# Patient Record
Sex: Female | Born: 1935 | Race: White | Hispanic: No | Smoking: Never smoker
Health system: Southern US, Community
[De-identification: ages and names within clinical notes are randomized; demographics above are authoritative.]

## PROBLEM LIST (undated history)

## (undated) DIAGNOSIS — Z8673 Personal history of transient ischemic attack (TIA), and cerebral infarction without residual deficits: Secondary | ICD-10-CM

## (undated) DIAGNOSIS — I10 Essential (primary) hypertension: Secondary | ICD-10-CM

## (undated) DIAGNOSIS — I2510 Atherosclerotic heart disease of native coronary artery without angina pectoris: Secondary | ICD-10-CM

## (undated) DIAGNOSIS — Z87442 Personal history of urinary calculi: Secondary | ICD-10-CM

## (undated) DIAGNOSIS — E785 Hyperlipidemia, unspecified: Secondary | ICD-10-CM

## (undated) DIAGNOSIS — Z8489 Family history of other specified conditions: Secondary | ICD-10-CM

## (undated) DIAGNOSIS — R011 Cardiac murmur, unspecified: Secondary | ICD-10-CM

## (undated) DIAGNOSIS — C801 Malignant (primary) neoplasm, unspecified: Secondary | ICD-10-CM

## (undated) HISTORY — PX: OTHER SURGICAL HISTORY: SHX169

## (undated) HISTORY — DX: Essential (primary) hypertension: I10

## (undated) HISTORY — PX: EYE SURGERY: SHX253

## (undated) HISTORY — PX: MASTOIDECTOMY: SHX711

## (undated) HISTORY — PX: DILATION AND CURETTAGE OF UTERUS: SHX78

## (undated) HISTORY — DX: Hyperlipidemia, unspecified: E78.5

## (undated) HISTORY — DX: Personal history of transient ischemic attack (TIA), and cerebral infarction without residual deficits: Z86.73

---

## 2008-05-25 ENCOUNTER — Inpatient Hospital Stay (HOSPITAL_COMMUNITY): Admission: EM | Admit: 2008-05-25 | Discharge: 2008-05-26 | Payer: Self-pay | Admitting: Emergency Medicine

## 2008-05-25 ENCOUNTER — Ambulatory Visit: Payer: Self-pay | Admitting: Cardiology

## 2008-05-26 ENCOUNTER — Encounter (INDEPENDENT_AMBULATORY_CARE_PROVIDER_SITE_OTHER): Payer: Self-pay | Admitting: Neurology

## 2011-03-04 NOTE — H&P (Signed)
NAME:  MARLIN, BRYS           ACCOUNT NO.:  [id number]   MEDICAL RECORD NO.:  [id number]          PATIENT TYPE:  INP   LOCATION:  3021                         FACILITY:  [facility]   PHYSICIAN:  Marlan Palau, M.D.  DATE OF BIRTH:  Jun 08, 1929   DATE OF ADMISSION:  05/25/2008  DATE OF DISCHARGE:                              HISTORY & PHYSICAL   HISTORY OF PRESENT ILLNESS:  Rebecca Pruitt is a 77 year old right-  handed white female born on 08-27-1929, with a history of diabetes,  hypertension, and cerebrovascular disease.  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  The patient has moved to the  [place], [place] area today and has probably suffered a TIA event  that occurred at 7:21 p.m. today.  The patient noted onset of speech  disturbance with slurring of speech, but also making nonsensical words.  This would undulating severity off and on for about an hour or so even  while in the emergency room.  The patient had no associated headache,  visual disturbance, numbness, or weakness on the arms or legs.  The  patient underwent a CT scan of the head that shows no acute changes, but  moderate amount of chronic small-vessel ischemic changes were noted.  The patient had been on Aggrenox and aspirin prior to this admission,  has NIH stroke scale score of 0, not a tPA candidate due to minimal  deficit.  Neurology was called for further evaluation.   PAST MEDICAL HISTORY:  Significant for:  1. Episode of transient aphasia with NIH stroke scale score 0      currently.  2. History of stroke, possible Wallenberg stroke in 2008.  3. Diabetes.  4. Hypertension.  5. Diabetic retinopathy.  6. Obesity.  7. Hysterectomy.  8. Bifid uterus, status post surgery.  9. Laser eye surgery in the past.  10.History of mastoid surgery in the past.   MEDICATIONS:  Include:  1. Aggrenox 1 twice  daily.  2. Aspirin 81 mg daily.  3. Univasc unknown dose daily.  4. Verapamil unknown dose daily.  5. Metformin 1000 mg twice daily.  6. NPH insulin 70/30, 42 units twice daily.  7. Lipitor unknown dose daily.  8. TriCor 145 mg daily.  9. Cosopt 1 drop both eyes b.i.d.   The patient has an allergy to CODEINE and does not smoke or drink.   SOCIAL HISTORY:  This patient is widowed.  She has just recently moved  to [place], [place] area.  She is retired.  She has 1 daughter who  is alive and well.   FAMILY MEDICAL HISTORY:  Her mother died with old age, had heart  disease.  Father died with cancer of prostate.  The patient has 1 sister  who is alive and well.   REVIEW OF SYSTEMS:  Notable for no recent fevers or chills.  The patient  denies headache.  Denies speech disturbance.  Denies trouble with neck  pain, shortness of breath, chest pains, or palpitations.  Denies any  abdominal pain or trouble with controlling bowels or bladder.  Denies  any blackout episodes or seizures.   PHYSICAL EXAMINATION:  VITAL SIGNS:  Blood pressure is 164/69, heart  rate 64, respiratory rate 20, and temperature afebrile.  GENERAL:  This patient is a moderately obese white female who is alert  and cooperative at the time of examination.  HEENT:  Head is atraumatic.  Eyes, pupils are postsurgical, round and  reactive to light.  Disks are flat.  Laser treatment changes in the  retina are seen.  NECK:  Supple.  No carotid bruits noted.  RESPIRATORY:  Clear.  CARDIOVASCULAR:  Regular rate and rhythm.  No obvious murmurs or rubs  noted.  ABDOMEN:  Positive bowel sounds.  No organomegaly or tenderness noted.  EXTREMITIES:  Trace edema of the ankles bilaterally.  NEUROLOGIC:  Cranial nerves as above.  Facial symmetry is present.  The  patient has good sensation of the face to pinprick and soft touch  bilaterally.  She has good strength of facial muscles and the muscles  with head turn and shoulder  shrug bilaterally.  Speech is well  enunciated, not aphasic.  Motor testing reveals 5/5 strength in all  fours.  Good symmetric motor tone is noted throughout.  Sensory testing  is intact to pinprick, soft touch, and vibratory sensation throughout.  Position sense is intact in all fours.  No evidence of extinction is  noted.  The patient has good finger-nose-finger and heel-to-shin.  Gait  was not tested.  Deep tendon reflexes are depressed, but symmetric.  Toes are downgoing bilaterally.  No drift is seen in the arms or legs.  No evidence of extinction is noted.   LABORATORY VALUES:  Notable for white count of 11.6, hemoglobin of 13.1,  hematocrit 39.5, MCV of 94.7, and platelets of 332.  The rest of blood  work is pending.   CT scan of the head is as above.   IMPRESSION:  1. History of transient ischemic attack, referable to left brain with      transient aphasia.  2. Diabetes.  3. Hypertension.  4. Dyslipidemia.   This patient does have a history of some prior cerebrovascular disease.  She has been on Aggrenox and aspirin.  We will admit the patient for  further workup at this point.   PLAN:  1. Admission to [facility].  2. MRI of the brain.  3. MRI angiogram of intracranial and extracranial vessels.  4. A 2-D echocardiogram.  5. Continue Aggrenox for now.  The patient is not a tPA candidate due      to minimal deficit.      Marlan Palau, M.D.  Electronically Signed     CKW/MEDQ  D:  05/25/2008  T:  05/26/2008  Job:  [id number]

## 2011-03-07 NOTE — Discharge Summary (Signed)
NAME:  Rebecca, Pruitt           ACCOUNT NO.:  [id number]   MEDICAL RECORD NO.:  [id number]          PATIENT TYPE:  INP   LOCATION:  3021                         FACILITY:  [facility]   PHYSICIAN:  Pramod P. Pearlean Brownie, MD    DATE OF BIRTH:  1928-12-11   DATE OF ADMISSION:  05/25/2008  DATE OF DISCHARGE:  05/26/2008                               DISCHARGE SUMMARY   DIAGNOSES AT TIME OF DISCHARGE:  1. Left brain transient ischemic attack with transient aphasia,      completely resolved.  2. 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  3. Diabetes.  4. Hypertension.  5. Diabetic retinopathy.  6. Obesity.  7. Hysterectomy.  8. Bifid uterus, status post surgery.  9. Laser eye surgery.  10.Mastoid surgery.   MEDICINE AT THE TIME OF DISCHARGE:  1. Aggrenox 1 b.i.d.  2. Lipitor 20 mg a day.  3. TriCor 48 mg a day.  4. Verapamil SA 240 mg a day.  5. Moexipril 15-25 mg a day.  6. Metformin 1000 mg b.i.d.  7. Humulin 70/30 42 units b.i.d.  8. Aspirin 81 mg a day.  9. Cosopt 1 drop both eyes b.i.d.  10.Macrobid b.i.d. x3 days.   STUDIES PERFORMED:  1. CT of the brain on admission showed no acute abnormality.  MRI of      the brain showed no acute stroke.  MRA of the brain showed no      vessel occlusion or correctable proximal stenosis.  She does have      atherosclerotic changes diffusely.  2. MRA of the neck shows no anterior circulation pathology, mild      atherosclerotic irregularity and carotid bifurcations, but no      stenosis or canal irregularity.  Right vertebral artery is small      and __________; the left vertebral serves the  basilar and appears      patent.  3. EKG shows sinus rhythm with first-degree AV block, left axis      deviation, voltage criteria for left ventricular hypertrophy,      inferior infarct and anterior infarct, both age undetermined.  4. Carotid Doppler shows no ICA stenosis.  A 2-D echocardiogram      performed with results pending at the  time of discharge.   LABORATORY STUDIES:  CBC:  White blood cells 11.6, otherwise normal,  differential normal.  Coagulation studies normal.  Chemistry with  potassium 3.1, otherwise normal.  Liver function tests normal.  Hemoglobin A1c 6.  Homocystine 8.9.  Cholesterol 147, triglycerides 175,  HDL 30, and LDL 82.  Urinalysis with positive nitrate, moderate  leukocyte esterase, 7-10 white blood cells, 0-2 red blood cells, and  many bacteria.   HISTORY OF PRESENT ILLNESS:  Rebecca Pruitt is a 77 year old  right-handed Caucasian female with a history of diabetes, hypertension,  and cerebrovascular disease.  She has what sounds like a left Wallenberg  stroke that occurred in 2008 with resulting ataxia and tendency to fall  to the left.  The patient just moved to [place], [place] today the  day of admission and probably suffered  a TIA event that occurred at 7:21  p.m. today.  The patient noticed onset of speech disturbance with  slurring of speech but also making nonsensical words.  Symptoms were  undulating for about an hour or so even while in the emergency room.  She had no associated headache, visual disturbance, or numbness or  weakness of the arms or legs.  CT of the brain showed no acute  abnormality, but had moderate chronic small vessel ischemic changes.  She has been on Aggrenox and aspirin prior to admission and she is not a  tPA candidate due to minimal deficits.  She was admitted to the hospital  for further evaluation.   HOSPITAL COURSE:  MRI was negative for acute stroke.  She had stroke  workup completed.  She does have vascular risk factors, which are well  controlled.  Of note, she has white blood cells of 11.6 and positive UA  for urinary tract infection and was started on Macrobid for a total of 3  days.  She was evaluated by PT, OT, and Speech and felt to have no  deficits and was deemed safe to go home.  She has been asked to follow  up with Dr. Anne Hahn  in 2 months and find a primary care physician in the  area and follow up with them within the next 1 month.   CONDITION ON DISCHARGE:  The patient is alert and oriented x3.  No  aphasia.  No dysarthria.  Breath sounds are clear.  Heart rate is  regular.  Gait is steady.  No extremity weakness.  Sensation is intact.   DISCHARGE/PLAN:  1. Discharge home with family.  2. Follow up Dr. Anne Hahn in 2 months.  3. Follow up with primary care physician within 1 month.  4. Continue Aggrenox and aspirin as prior to admission for secondary      stroke prevention, but we would recommend discontinuation of      aspirin alone unless being necessary by primary care physician as      Aggrenox contains aspirin and not needed for a stroke standpoint.      Annie Main, N.P.    ______________________________  Sunny Schlein. Pearlean Brownie, MD    SB/MEDQ  D:  05/30/2008  T:  05/31/2008  Job:  [id number]   cc:   Pramod P. Pearlean Brownie, MD

## 2011-07-18 LAB — LIPID PANEL
Cholesterol: 147
HDL: 30 — ABNORMAL LOW
LDL Cholesterol: 82
Total CHOL/HDL Ratio: 4.9
Triglycerides: 175 — ABNORMAL HIGH
VLDL: 35

## 2011-07-18 LAB — CK TOTAL AND CKMB (NOT AT ARMC)
CK, MB: 2.5
Relative Index: INVALID
Total CK: 63

## 2011-07-18 LAB — URINALYSIS, ROUTINE W REFLEX MICROSCOPIC
Bilirubin Urine: NEGATIVE
Glucose, UA: NEGATIVE
Hgb urine dipstick: NEGATIVE
Ketones, ur: NEGATIVE
Nitrite: POSITIVE — AB
Protein, ur: NEGATIVE
Specific Gravity, Urine: 1.018
Urobilinogen, UA: 0.2
pH: 5

## 2011-07-18 LAB — COMPREHENSIVE METABOLIC PANEL
ALT: 23
AST: 21
Albumin: 3.8
Alkaline Phosphatase: 41
BUN: 16
CO2: 28
Calcium: 10
Chloride: 104
Creatinine, Ser: 0.65
GFR calc Af Amer: >60
GFR calc non Af Amer: >60
Glucose, Bld: 72
Potassium: 3.1 — ABNORMAL LOW
Sodium: 140
Total Bilirubin: 0.6
Total Protein: 6.4

## 2011-07-18 LAB — PROTIME-INR
INR: 0.9
Prothrombin Time: 12.6

## 2011-07-18 LAB — DIFFERENTIAL
Basophils Absolute: 0
Basophils Relative: 0
Eosinophils Absolute: 0.2
Eosinophils Relative: 2
Lymphocytes Relative: 21
Lymphs Abs: 2.4
Monocytes Absolute: 1.2 — ABNORMAL HIGH
Monocytes Relative: 10
Neutro Abs: 7.7
Neutrophils Relative %: 67

## 2011-07-18 LAB — CBC
HCT: 39.5
Hemoglobin: 13.1
MCHC: 33.3
MCV: 94.7
Platelets: 322
RBC: 4.17
RDW: 13.9
WBC: 11.6 — ABNORMAL HIGH

## 2011-07-18 LAB — URINE MICROSCOPIC-ADD ON

## 2011-07-18 LAB — APTT: aPTT: 36

## 2011-07-18 LAB — TROPONIN I: Troponin I: 0.01

## 2011-07-18 LAB — HOMOCYSTEINE: Homocysteine: 8.9

## 2011-07-18 LAB — HEMOGLOBIN A1C
Hgb A1c MFr Bld: 6
Mean Plasma Glucose: 136

## 2012-02-27 ENCOUNTER — Encounter: Payer: Self-pay | Admitting: *Deleted

## 2015-12-10 ENCOUNTER — Other Ambulatory Visit (HOSPITAL_COMMUNITY): Payer: Self-pay | Admitting: Geriatric Medicine

## 2015-12-10 DIAGNOSIS — R1310 Dysphagia, unspecified: Secondary | ICD-10-CM

## 2015-12-10 DIAGNOSIS — I6340 Cerebral infarction due to embolism of unspecified cerebral artery: Secondary | ICD-10-CM

## 2015-12-13 ENCOUNTER — Ambulatory Visit (HOSPITAL_COMMUNITY)
Admission: RE | Admit: 2015-12-13 | Discharge: 2015-12-13 | Disposition: A | Discharge status: Home / Self Care | Payer: No Typology Code available for payment source | Source: Ambulatory Visit | Attending: Geriatric Medicine | Admitting: Geriatric Medicine

## 2015-12-13 DIAGNOSIS — H548 Legal blindness, as defined in USA: Secondary | ICD-10-CM | POA: Diagnosis not present

## 2015-12-13 DIAGNOSIS — I10 Essential (primary) hypertension: Secondary | ICD-10-CM | POA: Diagnosis not present

## 2015-12-13 DIAGNOSIS — I060 Rheumatic aortic stenosis: Secondary | ICD-10-CM | POA: Insufficient documentation

## 2015-12-13 DIAGNOSIS — I69391 Dysphagia following cerebral infarction: Secondary | ICD-10-CM | POA: Diagnosis not present

## 2015-12-13 DIAGNOSIS — R1310 Dysphagia, unspecified: Secondary | ICD-10-CM | POA: Insufficient documentation

## 2015-12-13 DIAGNOSIS — I6340 Cerebral infarction due to embolism of unspecified cerebral artery: Secondary | ICD-10-CM

## 2015-12-13 DIAGNOSIS — E119 Type 2 diabetes mellitus without complications: Secondary | ICD-10-CM | POA: Insufficient documentation

## 2015-12-13 NOTE — Progress Notes (Signed)
MBSS complete. Full report located under chart review in imaging section.  Tajah Schreiner MA, CCC-SLP [phone]   

## 2019-11-12 ENCOUNTER — Ambulatory Visit: Payer: Medicare Other | Attending: Internal Medicine

## 2019-11-12 DIAGNOSIS — Z23 Encounter for immunization: Secondary | ICD-10-CM | POA: Insufficient documentation

## 2019-11-12 NOTE — Progress Notes (Signed)
   Covid-19 Vaccination Clinic  Name:  [name]    MRN: [id number] DOB: 1929/08/08  11/12/2019  Rebecca Pruitt was observed post Covid-19 immunization for 15 minutes without incidence. She was provided with Vaccine Information Sheet and instruction to access the V-Safe system.   Rebecca Pruitt was instructed to call 911 with any severe reactions post vaccine: Marland Kitchen Difficulty breathing  . Swelling of your face and throat  . A fast heartbeat  . A bad rash all over your body  . Dizziness and weakness    Immunizations Administered    Name Date Dose VIS Date Route   Pfizer COVID-19 Vaccine 11/12/2019 12:44 PM 0.3 mL 09/30/2019 Intramuscular   Manufacturer: [place]   Lot: [id number]   [place]: [id number]

## 2019-12-03 ENCOUNTER — Ambulatory Visit: Payer: Medicare Other | Attending: Internal Medicine

## 2019-12-03 DIAGNOSIS — Z23 Encounter for immunization: Secondary | ICD-10-CM

## 2019-12-03 NOTE — Progress Notes (Signed)
   Covid-19 Vaccination Clinic  Name:  [name]    MRN: [id number] DOB: [birth year]  12/03/2019  Rebecca Pruitt was observed post Covid-19 immunization for 15 minutes without incidence. She was provided with Vaccine Information Sheet and instruction to access the V-Safe system.   Rebecca Pruitt was instructed to call 911 with any severe reactions post vaccine: Marland Kitchen Difficulty breathing  . Swelling of your face and throat  . A fast heartbeat  . A bad rash all over your body  . Dizziness and weakness    Immunizations Administered    Name Date Dose VIS Date Route   Pfizer COVID-19 Vaccine 12/03/2019 11:38 AM 0.3 mL 09/30/2019 Intramuscular   Manufacturer: [place]   Lot: [id number]   [place]: [id number]

## 2020-03-20 DIAGNOSIS — I639 Cerebral infarction, unspecified: Secondary | ICD-10-CM

## 2020-03-20 HISTORY — DX: Cerebral infarction, unspecified: I63.9

## 2020-11-20 ENCOUNTER — Other Ambulatory Visit: Payer: Self-pay | Admitting: Urology

## 2020-11-20 NOTE — Patient Instructions (Addendum)
DUE TO COVID-19 ONLY ONE VISITOR IS ALLOWED TO COME WITH YOU AND STAY IN THE WAITING ROOM ONLY DURING PRE OP AND PROCEDURE DAY OF SURGERY. THE 1 VISITOR  MAY VISIT WITH YOU AFTER SURGERY IN YOUR PRIVATE ROOM DURING VISITING HOURS ONLY!  YOU NEED TO HAVE A COVID 19 TEST ON_2/4______ @_12 :30______, THIS TEST MUST BE DONE BEFORE SURGERY,  COVID TESTING SITE [address] [place] [address], IT IS ON THE RIGHT GOING OUT [address] APPROXIMATELY  2 MINUTES PAST [employer] ON THE RIGHT. ONCE YOUR COVID TEST IS COMPLETED,  PLEASE BEGIN THE QUARANTINE INSTRUCTIONS AS OUTLINED IN YOUR HANDOUT.                Rebecca Pruitt    Your procedure is scheduled on: 11/27/20   Report to [facility] Main  Entrance   Report to admitting at  10:50 AM     Call this number if you have problems the morning of surgery [phone]    Remember: Do not eat food or drink liquids :After Midnight.   BRUSH YOUR TEETH MORNING OF SURGERY AND RINSE YOUR MOUTH OUT, NO CHEWING GUM CANDY OR MINTS.     Take these medicines the morning of surgery with A SIP OF WATER: Aricept     How to Manage Your Diabetes Before and After Surgery  Why is it important to control my blood sugar before and after surgery? . Improving blood sugar levels before and after surgery helps healing and can limit problems. . A way of improving blood sugar control is eating a healthy diet by: o  Eating less sugar and carbohydrates o  Increasing activity/exercise o  Talking with your doctor about reaching your blood sugar goals . High blood sugars (greater than 180 mg/dL) can raise your risk of infections and slow your recovery, so you will need to focus on controlling your diabetes during the weeks before surgery. . Make sure that the doctor who takes care of your diabetes knows about your planned surgery including the date and location.  How do I manage my blood sugar before surgery? . Check your blood sugar at  least 4 times a day, starting 2 days before surgery, to make sure that the level is not too high or low. o Check your blood sugar the morning of your surgery when you wake up and every 2 hours until you get to the Short Stay unit. . If your blood sugar is less than 70 mg/dL, you will need to treat for low blood sugar: o Do not take insulin. o Treat a low blood sugar (less than 70 mg/dL) with  cup of clear juice (cranberry or apple), 4 glucose tablets, OR glucose gel. o Recheck blood sugar in 15 minutes after treatment (to make sure it is greater than 70 mg/dL). If your blood sugar is not greater than 70 mg/dL on recheck, call [phone] for further instructions. . Report your blood sugar to the short stay nurse when you get to Short Stay.  . If you are admitted to the hospital after surgery: o Your blood sugar will be checked by the staff and you will probably be given insulin after surgery (instead of oral diabetes medicines) to make sure you have good blood sugar levels. o The goal for blood sugar control after surgery is 80-180 mg/dL.   WHAT DO I DO ABOUT MY DIABETES MEDICATION?  Marland Kitchen Do not take oral diabetes medicines (pills) the morning of surgery.  Marland Kitchen  THE NIGHT BEFORE SURGERY, take     units of       insulin.       . THE MORNING OF SURGERY, take   units of         insulin.  . The day of surgery, do not take other diabetes injectables, including Byetta (exenatide), Bydureon (exenatide ER), Victoza (liraglutide), or Trulicity (dulaglutide).  . If your CBG is greater than 220 mg/dL, you may take  of your sliding scale  . (correction) dose of insulin.                              You may not have any metal on your body including hair pins and              piercings  Do not wear jewelry, make-up, lotions, powders or perfumes, deodorant             Do not wear nail polish on your fingernails.  Do not shave  48 hours prior to surgery.  .   Do not bring valuables to the hospital. [facility] IS NOT             RESPONSIBLE   FOR VALUABLES.  Contacts, dentures or bridgework may not be worn into surgery.      Patients discharged the day of surgery will not be allowed to drive home.   IF YOU ARE HAVING SURGERY AND GOING HOME THE SAME DAY, YOU MUST HAVE AN ADULT TO DRIVE YOU HOME AND BE WITH YOU FOR 24 HOURS.  YOU MAY GO HOME BY TAXI OR UBER OR ORTHERWISE, BUT AN ADULT MUST ACCOMPANY YOU HOME AND STAY WITH YOU FOR 24 HOURS.  Name and phone number of your driver:  Special Instructions: N/A              Please read over the following fact sheets you were given: _____________________________________________________________________             [facility] - Preparing for Surgery Before surgery, you can play an important role.  Because skin is not sterile, your skin needs to be as free of germs as possible.  You can reduce the number of germs on your skin by washing with CHG (chlorahexidine gluconate) soap before surgery.  CHG is an antiseptic cleaner which kills germs and bonds with the skin to continue killing germs even after washing. Please DO NOT use if you have an allergy to CHG or antibacterial soaps.  If your skin becomes reddened/irritated stop using the CHG and inform your nurse when you arrive at Short Stay. Do not shave (including legs and underarms) for at least 48 hours prior to the first CHG shower.  . Please follow these instructions carefully:  1.  Shower with CHG Soap the night before surgery and the  morning of Surgery.  2.  If you choose to wash your hair, wash your hair first as usual with your  normal  shampoo.  3.  After you shampoo, rinse your hair and body thoroughly to remove the  shampoo.                                        4.  Use CHG as you would any other liquid soap.  You can apply chg directly  to the skin and wash  Gently with a scrungie or clean washcloth.  5.  Apply the CHG Soap to your body ONLY FROM THE NECK DOWN.    Do not use on face/ open                           Wound or open sores. Avoid contact with eyes, ears mouth and genitals (private parts).                       Wash face,  Genitals (private parts) with your normal soap.             6.  Wash thoroughly, paying special attention to the area where your surgery  will be performed.  7.  Thoroughly rinse your body with warm water from the neck down.  8.  DO NOT shower/wash with your normal soap after using and rinsing off  the CHG Soap.             9.  Pat yourself dry with a clean towel.            10.  Wear clean pajamas.            11.  Place clean sheets on your bed the night of your first shower and do not  sleep with pets. Day of Surgery : Do not apply any lotions/deodorants the morning of surgery.  Please wear clean clothes to the hospital/surgery center.  FAILURE TO FOLLOW THESE INSTRUCTIONS MAY RESULT IN THE CANCELLATION OF YOUR SURGERY PATIENT SIGNATURE_________________________________  NURSE SIGNATURE__________________________________  ________________________________________________________________________

## 2020-11-22 ENCOUNTER — Emergency Department (HOSPITAL_COMMUNITY): Payer: Medicare Other

## 2020-11-22 ENCOUNTER — Encounter (HOSPITAL_COMMUNITY): Payer: Self-pay | Admitting: Emergency Medicine

## 2020-11-22 ENCOUNTER — Other Ambulatory Visit: Payer: Self-pay

## 2020-11-22 ENCOUNTER — Encounter (HOSPITAL_COMMUNITY): Payer: Self-pay

## 2020-11-22 ENCOUNTER — Encounter (HOSPITAL_COMMUNITY): Payer: Self-pay | Admitting: Physician Assistant

## 2020-11-22 ENCOUNTER — Inpatient Hospital Stay (HOSPITAL_COMMUNITY)
Admission: EM | Admit: 2020-11-22 | Discharge: 2020-11-30 | DRG: 659 | Disposition: A | Discharge status: Home Health | Payer: Medicare Other | Attending: Internal Medicine | Admitting: Internal Medicine

## 2020-11-22 ENCOUNTER — Encounter (HOSPITAL_COMMUNITY)
Admission: RE | Admit: 2020-11-22 | Discharge: 2020-11-22 | Disposition: A | Discharge status: Home / Self Care | Payer: Medicare Other | Source: Ambulatory Visit | Attending: Urology | Admitting: Urology

## 2020-11-22 DIAGNOSIS — Z885 Allergy status to narcotic agent status: Secondary | ICD-10-CM

## 2020-11-22 DIAGNOSIS — N136 Pyonephrosis: Principal | ICD-10-CM | POA: Diagnosis present

## 2020-11-22 DIAGNOSIS — I080 Rheumatic disorders of both mitral and aortic valves: Secondary | ICD-10-CM | POA: Diagnosis present

## 2020-11-22 DIAGNOSIS — Z7902 Long term (current) use of antithrombotics/antiplatelets: Secondary | ICD-10-CM

## 2020-11-22 DIAGNOSIS — R531 Weakness: Secondary | ICD-10-CM | POA: Diagnosis not present

## 2020-11-22 DIAGNOSIS — Z01818 Encounter for other preprocedural examination: Secondary | ICD-10-CM | POA: Insufficient documentation

## 2020-11-22 DIAGNOSIS — F0391 Unspecified dementia with behavioral disturbance: Secondary | ICD-10-CM

## 2020-11-22 DIAGNOSIS — N390 Urinary tract infection, site not specified: Secondary | ICD-10-CM

## 2020-11-22 DIAGNOSIS — N3000 Acute cystitis without hematuria: Secondary | ICD-10-CM

## 2020-11-22 DIAGNOSIS — Z87440 Personal history of urinary (tract) infections: Secondary | ICD-10-CM

## 2020-11-22 DIAGNOSIS — Z8673 Personal history of transient ischemic attack (TIA), and cerebral infarction without residual deficits: Secondary | ICD-10-CM

## 2020-11-22 DIAGNOSIS — Z794 Long term (current) use of insulin: Secondary | ICD-10-CM

## 2020-11-22 DIAGNOSIS — Z20822 Contact with and (suspected) exposure to covid-19: Secondary | ICD-10-CM | POA: Diagnosis present

## 2020-11-22 DIAGNOSIS — Z87442 Personal history of urinary calculi: Secondary | ICD-10-CM

## 2020-11-22 DIAGNOSIS — R319 Hematuria, unspecified: Secondary | ICD-10-CM

## 2020-11-22 DIAGNOSIS — Z90710 Acquired absence of both cervix and uterus: Secondary | ICD-10-CM

## 2020-11-22 DIAGNOSIS — N323 Diverticulum of bladder: Secondary | ICD-10-CM | POA: Diagnosis present

## 2020-11-22 DIAGNOSIS — R011 Cardiac murmur, unspecified: Secondary | ICD-10-CM | POA: Diagnosis present

## 2020-11-22 DIAGNOSIS — E11319 Type 2 diabetes mellitus with unspecified diabetic retinopathy without macular edema: Secondary | ICD-10-CM | POA: Diagnosis present

## 2020-11-22 DIAGNOSIS — Z79899 Other long term (current) drug therapy: Secondary | ICD-10-CM

## 2020-11-22 DIAGNOSIS — Q248 Other specified congenital malformations of heart: Secondary | ICD-10-CM

## 2020-11-22 DIAGNOSIS — N1370 Vesicoureteral-reflux, unspecified: Secondary | ICD-10-CM | POA: Diagnosis present

## 2020-11-22 DIAGNOSIS — R5381 Other malaise: Secondary | ICD-10-CM

## 2020-11-22 DIAGNOSIS — E119 Type 2 diabetes mellitus without complications: Secondary | ICD-10-CM | POA: Insufficient documentation

## 2020-11-22 DIAGNOSIS — E785 Hyperlipidemia, unspecified: Secondary | ICD-10-CM | POA: Diagnosis present

## 2020-11-22 DIAGNOSIS — G9341 Metabolic encephalopathy: Secondary | ICD-10-CM | POA: Diagnosis present

## 2020-11-22 DIAGNOSIS — N320 Bladder-neck obstruction: Secondary | ICD-10-CM | POA: Diagnosis present

## 2020-11-22 DIAGNOSIS — I2510 Atherosclerotic heart disease of native coronary artery without angina pectoris: Secondary | ICD-10-CM | POA: Diagnosis present

## 2020-11-22 DIAGNOSIS — F0390 Unspecified dementia without behavioral disturbance: Secondary | ICD-10-CM | POA: Diagnosis present

## 2020-11-22 DIAGNOSIS — I313 Pericardial effusion (noninflammatory): Secondary | ICD-10-CM | POA: Diagnosis present

## 2020-11-22 DIAGNOSIS — H548 Legal blindness, as defined in USA: Secondary | ICD-10-CM | POA: Diagnosis present

## 2020-11-22 DIAGNOSIS — E876 Hypokalemia: Secondary | ICD-10-CM | POA: Diagnosis present

## 2020-11-22 DIAGNOSIS — H9190 Unspecified hearing loss, unspecified ear: Secondary | ICD-10-CM | POA: Diagnosis present

## 2020-11-22 DIAGNOSIS — D62 Acute posthemorrhagic anemia: Secondary | ICD-10-CM | POA: Diagnosis not present

## 2020-11-22 DIAGNOSIS — I10 Essential (primary) hypertension: Secondary | ICD-10-CM | POA: Diagnosis present

## 2020-11-22 DIAGNOSIS — L8990 Pressure ulcer of unspecified site, unspecified stage: Secondary | ICD-10-CM | POA: Insufficient documentation

## 2020-11-22 DIAGNOSIS — N029 Recurrent and persistent hematuria with unspecified morphologic changes: Secondary | ICD-10-CM | POA: Diagnosis present

## 2020-11-22 DIAGNOSIS — L89151 Pressure ulcer of sacral region, stage 1: Secondary | ICD-10-CM | POA: Diagnosis present

## 2020-11-22 DIAGNOSIS — Z7982 Long term (current) use of aspirin: Secondary | ICD-10-CM

## 2020-11-22 HISTORY — DX: Malignant (primary) neoplasm, unspecified: C80.1

## 2020-11-22 HISTORY — DX: Personal history of urinary calculi: Z87.442

## 2020-11-22 HISTORY — DX: Family history of other specified conditions: Z84.89

## 2020-11-22 HISTORY — DX: Cardiac murmur, unspecified: R01.1

## 2020-11-22 HISTORY — DX: Atherosclerotic heart disease of native coronary artery without angina pectoris: I25.10

## 2020-11-22 LAB — CBC
HCT: 36.5 % (ref 36.0–46.0)
HCT: 36.9 % (ref 36.0–46.0)
Hemoglobin: 11.5 g/dL — ABNORMAL LOW (ref 12.0–15.0)
Hemoglobin: 11.7 g/dL — ABNORMAL LOW (ref 12.0–15.0)
MCH: 29.3 pg (ref 26.0–34.0)
MCH: 29.5 pg (ref 26.0–34.0)
MCHC: 31.5 g/dL (ref 30.0–36.0)
MCHC: 31.7 g/dL (ref 30.0–36.0)
MCV: 93.1 fL (ref 80.0–100.0)
MCV: 92.9 fL (ref 80.0–100.0)
Platelets: 341 10*3/uL (ref 150–400)
Platelets: 332 10*3/uL (ref 150–400)
RBC: 3.92 MIL/uL (ref 3.87–5.11)
RBC: 3.97 MIL/uL (ref 3.87–5.11)
RDW: 14.1 % (ref 11.5–15.5)
RDW: 13.9 % (ref 11.5–15.5)
WBC: 7 10*3/uL (ref 4.0–10.5)
WBC: 7.2 10*3/uL (ref 4.0–10.5)
nRBC: 0 % (ref 0.0–0.2)
nRBC: 0 % (ref 0.0–0.2)

## 2020-11-22 LAB — BASIC METABOLIC PANEL
Anion gap: 10 (ref 5–15)
BUN: 16 mg/dL (ref 8–23)
CO2: 26 mmol/L (ref 22–32)
Calcium: 10.2 mg/dL (ref 8.9–10.3)
Chloride: 101 mmol/L (ref 98–111)
Creatinine, Ser: 0.73 mg/dL (ref 0.44–1.00)
GFR, Estimated: >60 mL/min (ref >60)
Glucose, Bld: 140 mg/dL — ABNORMAL HIGH (ref 70–99)
Potassium: 3.6 mmol/L (ref 3.5–5.1)
Sodium: 137 mmol/L (ref 135–145)

## 2020-11-22 LAB — DIFFERENTIAL
Abs Immature Granulocytes: 0.02 10*3/uL (ref 0.00–0.07)
Basophils Absolute: 0 10*3/uL (ref 0.0–0.1)
Basophils Relative: 0 %
Eosinophils Absolute: 0.2 10*3/uL (ref 0.0–0.5)
Eosinophils Relative: 2 %
Immature Granulocytes: 0 %
Lymphocytes Relative: 26 %
Lymphs Abs: 1.9 10*3/uL (ref 0.7–4.0)
Monocytes Absolute: 0.8 10*3/uL (ref 0.1–1.0)
Monocytes Relative: 11 %
Neutro Abs: 4.3 10*3/uL (ref 1.7–7.7)
Neutrophils Relative %: 61 %

## 2020-11-22 LAB — URINALYSIS, ROUTINE W REFLEX MICROSCOPIC
Bilirubin Urine: NEGATIVE
Glucose, UA: NEGATIVE mg/dL
Ketones, ur: NEGATIVE mg/dL
Nitrite: NEGATIVE
Protein, ur: 100 mg/dL — AB
RBC / HPF: >50 RBC/hpf — ABNORMAL HIGH (ref 0–5)
Specific Gravity, Urine: 1.01 (ref 1.005–1.030)
WBC, UA: >50 WBC/hpf — ABNORMAL HIGH (ref 0–5)
pH: 6 (ref 5.0–8.0)

## 2020-11-22 LAB — COMPREHENSIVE METABOLIC PANEL
ALT: 9 U/L (ref 0–44)
AST: 10 U/L — ABNORMAL LOW (ref 15–41)
Albumin: 3 g/dL — ABNORMAL LOW (ref 3.5–5.0)
Alkaline Phosphatase: 44 U/L (ref 38–126)
Anion gap: 11 (ref 5–15)
BUN: 17 mg/dL (ref 8–23)
CO2: 26 mmol/L (ref 22–32)
Calcium: 10.5 mg/dL — ABNORMAL HIGH (ref 8.9–10.3)
Chloride: 101 mmol/L (ref 98–111)
Creatinine, Ser: 0.82 mg/dL (ref 0.44–1.00)
GFR, Estimated: >60 mL/min (ref >60)
Glucose, Bld: 154 mg/dL — ABNORMAL HIGH (ref 70–99)
Potassium: 3.9 mmol/L (ref 3.5–5.1)
Sodium: 138 mmol/L (ref 135–145)
Total Bilirubin: 0.7 mg/dL (ref 0.3–1.2)
Total Protein: 7 g/dL (ref 6.5–8.1)

## 2020-11-22 LAB — I-STAT CHEM 8, ED
BUN: 17 mg/dL (ref 8–23)
Calcium, Ion: 1.41 mmol/L — ABNORMAL HIGH (ref 1.15–1.40)
Chloride: 101 mmol/L (ref 98–111)
Creatinine, Ser: 0.7 mg/dL (ref 0.44–1.00)
Glucose, Bld: 148 mg/dL — ABNORMAL HIGH (ref 70–99)
HCT: 35 % — ABNORMAL LOW (ref 36.0–46.0)
Hemoglobin: 11.9 g/dL — ABNORMAL LOW (ref 12.0–15.0)
Potassium: 3.9 mmol/L (ref 3.5–5.1)
Sodium: 137 mmol/L (ref 135–145)
TCO2: 28 mmol/L (ref 22–32)

## 2020-11-22 LAB — PROTIME-INR
INR: 1.1 (ref 0.8–1.2)
Prothrombin Time: 14.1 seconds (ref 11.4–15.2)

## 2020-11-22 LAB — RAPID URINE DRUG SCREEN, HOSP PERFORMED
Amphetamines: NOT DETECTED
Barbiturates: NOT DETECTED
Benzodiazepines: NOT DETECTED
Cocaine: NOT DETECTED
Opiates: NOT DETECTED
Tetrahydrocannabinol: NOT DETECTED

## 2020-11-22 LAB — GLUCOSE, CAPILLARY: Glucose-Capillary: 148 mg/dL — ABNORMAL HIGH (ref 70–99)

## 2020-11-22 LAB — I-STAT BETA HCG BLOOD, ED (MC, WL, AP ONLY): I-stat hCG, quantitative: 7.2 m[IU]/mL — ABNORMAL HIGH (ref <5)

## 2020-11-22 LAB — HEMOGLOBIN A1C
Hgb A1c MFr Bld: 7.5 % — ABNORMAL HIGH (ref 4.8–5.6)
Mean Plasma Glucose: 168.55 mg/dL

## 2020-11-22 LAB — CBG MONITORING, ED: Glucose-Capillary: 119 mg/dL — ABNORMAL HIGH (ref 70–99)

## 2020-11-22 LAB — SARS CORONAVIRUS 2 BY RT PCR (HOSPITAL ORDER, PERFORMED IN ~~LOC~~ HOSPITAL LAB): SARS Coronavirus 2: NEGATIVE

## 2020-11-22 LAB — LACTIC ACID, PLASMA: Lactic Acid, Venous: 1 mmol/L (ref 0.5–1.9)

## 2020-11-22 LAB — APTT: aPTT: 34 seconds (ref 24–36)

## 2020-11-22 MED ORDER — SODIUM CHLORIDE 0.9 % IV SOLN
100.0000 mL/h | INTRAVENOUS | Status: DC
  Administered 2020-11-22 – 2020-11-25 (×6): 100 mL/h via INTRAVENOUS

## 2020-11-22 MED ORDER — ACETAMINOPHEN 325 MG PO TABS
650.0000 mg | ORAL_TABLET | Freq: Four times a day (QID) | ORAL | Status: DC | PRN
  Administered 2020-11-24: 650 mg via ORAL
  Filled 2020-11-22: qty 2

## 2020-11-22 MED ORDER — SODIUM CHLORIDE 0.9 % IV SOLN
75.0000 mL/h | INTRAVENOUS | Status: AC
  Administered 2020-11-23: 75 mL/h via INTRAVENOUS

## 2020-11-22 MED ORDER — INSULIN ASPART 100 UNIT/ML ~~LOC~~ SOLN
0.0000 [IU] | SUBCUTANEOUS | Status: DC
  Administered 2020-11-23: 17:00:00 2 [IU] via SUBCUTANEOUS
  Administered 2020-11-24: 18:00:00 3 [IU] via SUBCUTANEOUS
  Administered 2020-11-24 – 2020-11-25 (×3): 2 [IU] via SUBCUTANEOUS
  Administered 2020-11-25 – 2020-11-26 (×3): 1 [IU] via SUBCUTANEOUS
  Administered 2020-11-26: 3 [IU] via SUBCUTANEOUS
  Administered 2020-11-27: 2 [IU] via SUBCUTANEOUS
  Administered 2020-11-27 (×2): 1 [IU] via SUBCUTANEOUS
  Administered 2020-11-27 – 2020-11-28 (×2): 2 [IU] via SUBCUTANEOUS
  Administered 2020-11-28: 1 [IU] via SUBCUTANEOUS
  Administered 2020-11-28: 3 [IU] via SUBCUTANEOUS
  Administered 2020-11-28: 1 [IU] via SUBCUTANEOUS
  Administered 2020-11-28: 2 [IU] via SUBCUTANEOUS
  Administered 2020-11-28: 1 [IU] via SUBCUTANEOUS
  Administered 2020-11-29: 2 [IU] via SUBCUTANEOUS
  Administered 2020-11-29: 1 [IU] via SUBCUTANEOUS
  Administered 2020-11-29: 2 [IU] via SUBCUTANEOUS
  Administered 2020-11-29: 1 [IU] via SUBCUTANEOUS
  Administered 2020-11-29: 2 [IU] via SUBCUTANEOUS
  Administered 2020-11-30 (×2): 1 [IU] via SUBCUTANEOUS
  Filled 2020-11-22: qty 0.09

## 2020-11-22 MED ORDER — CITALOPRAM HYDROBROMIDE 20 MG PO TABS
20.0000 mg | ORAL_TABLET | Freq: Every day | ORAL | Status: DC
  Administered 2020-11-23 – 2020-11-29 (×8): 20 mg via ORAL
  Filled 2020-11-22 (×6): qty 1
  Filled 2020-11-22: qty 2
  Filled 2020-11-22: qty 1

## 2020-11-22 MED ORDER — SODIUM CHLORIDE 0.9 % IV SOLN
1.0000 g | Freq: Once | INTRAVENOUS | Status: AC
  Administered 2020-11-22: 1 g via INTRAVENOUS
  Filled 2020-11-22: qty 10

## 2020-11-22 MED ORDER — ACETAMINOPHEN 650 MG RE SUPP: 650.0000 mg | Freq: Four times a day (QID) | RECTAL | Status: DC | PRN

## 2020-11-22 MED ORDER — SODIUM CHLORIDE 0.9 % IV BOLUS
500.0000 mL | Freq: Once | INTRAVENOUS | Status: AC
  Administered 2020-11-22: 500 mL via INTRAVENOUS

## 2020-11-22 NOTE — Progress Notes (Addendum)
COVID Vaccine Completed:Yes Date COVID Vaccine completed:12/03/19 -Booster 09/30/20 COVID vaccine manufacturer: [place]     PCP - T[facility] FNP Cardiologist - no  Chest x-ray -no  EKG - 11/22/20- chart, epic Stress Test - no ECHO - no Cardiac Cath -NA  Pacemaker/ICD device last checked:NA  Sleep Study - no CPAP -   Fasting Blood Sugar - 120-130 Checks Blood Sugar BID_____ times a day  Blood Thinner Instructions:Plavix and ASA/ [place] Aspirin Instructions:Continue/ Dr. Hampton Abbot Last Dose:  Anesthesia review:   Patient denies shortness of breath, fever, cough and chest pain at PAT appointment  Yes  . Patient verbalized understanding of instructions that were given to them at the PAT appointment. Patient was also instructed that they will need to review over the PAT instructions again at home before surgery. Yes Pt's Daughter is her POA and care giver. Daughter was concerened about her Mother's decline over the last 6 days. Her mother is lethargic and slow to respond needing more help to transfer to the wheelchair.  Vital signs were stable. I called Konrad Felix PA to talk with the daughter about  her concerns as well as when would be appropriate to take the Pt to the PCP/ ED if she declines more prior to DOS.

## 2020-11-22 NOTE — ED Provider Notes (Addendum)
[facility] DEPT Provider Note   CSN: [id number] Arrival date & time: 11/22/20  1518     History Chief Complaint  Patient presents with  . Weakness    Rebecca Pruitt is a 87 y.o. female.  HPI   Patient presents to the ER for evaluation of worsening weakness.  Patient lives at home with her daughter who is her primary caregiver.  The daughter has noticed in the last several weeks the decline in the patient's health.  She normally was able to walk 150 feet with a walker but in the last several weeks that has been declining and now she is only able to walk maybe 10 feet.  Patient has been dealing with a urinary tract infection and it sounds like possibly some type of obstructive process.  He has been seeing urology.  Patient was at the hospital getting preop testing when she had difficulty even getting into the car.  Daughter brought her in for further evaluation.  Daughter is concerned that she might have an infection.  Her appearance is similar to when she has had an infection in the past.  Patient also has had prior strokes but the daughter states this is not the typical appearance of her stroke.  She is not having any focal weakness or deficits but more generalized.  She has not had any fevers.  No vomiting or diarrhea.  Patient has been eating but her appetite is less.  Past Medical History:  Diagnosis Date  . Cancer ([place])    skin  . Coronary artery disease   . Diabetes mellitus   . Diabetic retinopathy   . Dyslipidemia   . H/O: CVA (cardiovascular accident)   . Hearing loss   . Heart murmur   . History of kidney stones   . Hypertension   . Stroke ([place]) 03/2020   x5    There are no problems to display for this patient.   Past Surgical History:  Procedure Laterality Date  . CESAREAN SECTION    . EYE SURGERY Bilateral   . hysterectomy - unknown type    . MASTOIDECTOMY     as a child     OB History   No obstetric history on file.      Family History  Problem Relation Age of Onset  . Prostate cancer Other     Social History   Tobacco Use  . Smoking status: Never Smoker  . Smokeless tobacco: Never Used  Vaping Use  . Vaping Use: Never used  Substance Use Topics  . Alcohol use: No  . Drug use: Never    Home Medications Prior to Admission medications   Medication Sig Start Date End Date Taking? Authorizing Provider  amoxicillin-clavulanate (AUGMENTIN) 500-125 MG tablet Take 1 tablet by mouth 2 (two) times daily. 11/19/20   [provider]  aspirin 81 MG tablet Take 81 mg by mouth at bedtime.    [provider]  citalopram (CELEXA) 20 MG tablet Take 20 mg by mouth at bedtime.    [provider]  clopidogrel (PLAVIX) 75 MG tablet Take 75 mg by mouth daily.    [provider]  CRANBERRY PO Take 4,200 mg by mouth 2 (two) times daily.    [provider]  donepezil (ARICEPT) 10 MG tablet Take 10 mg by mouth at bedtime.    [provider]  fenofibrate 160 MG tablet Take 40 mg by mouth at bedtime.    [provider]  furosemide (LASIX) 20 MG tablet Take 10 mg by mouth daily.    [provider]  insulin NPH-insulin regular (NOVOLIN 70/30) (70-30) 100 UNIT/ML injection Inject 1-7 Units into the skin 2 (two) times daily as needed (high blood sugar).    [provider]  mupirocin ointment (BACTROBAN) 2 % Apply 1 application topically 2 (two) times daily as needed for wound care. 09/25/20   [provider]    Allergies    Codeine  Review of Systems   Review of Systems  Constitutional: Negative for fever.  Respiratory: Negative for shortness of breath.   Gastrointestinal: Negative for abdominal pain and vomiting.  Neurological: Negative for seizures.    Physical Exam Updated Vital Signs BP (!) 150/56   Pulse (!) 53   Temp 97.9 F (36.6 C) (Oral)   Resp 16   SpO2 100%   Physical Exam Vitals and nursing note reviewed.   Constitutional:      Appearance: She is well-developed and well-nourished. She is not diaphoretic.     Comments: Frail,  HENT:     Head: Normocephalic and atraumatic.     Right Ear: External ear normal.     Left Ear: External ear normal.     Mouth/Throat:     Mouth: Mucous membranes are dry.  Eyes:     General: No scleral icterus.       Right eye: No discharge.        Left eye: No discharge.     Conjunctiva/sclera: Conjunctivae normal.  Neck:     Trachea: No tracheal deviation.  Cardiovascular:     Rate and Rhythm: Normal rate and regular rhythm.     Pulses: Intact distal pulses.  Pulmonary:     Effort: Pulmonary effort is normal. No respiratory distress.     Breath sounds: Normal breath sounds. No stridor. No wheezing or rales.  Abdominal:     General: Bowel sounds are normal. There is no distension.     Palpations: Abdomen is soft.     Tenderness: There is no abdominal tenderness. There is no guarding or rebound.  Musculoskeletal:        General: No tenderness or edema.     Cervical back: Neck supple.  Skin:    General: Skin is warm and dry.     Findings: No rash.  Neurological:     Mental Status: She is alert.     Cranial Nerves: No cranial nerve deficit (no facial droop, extraocular movements intact, no slurred speech , speech very slow).     Sensory: No sensory deficit.     Motor: Weakness present. No abnormal muscle tone or seizure activity.     Deep Tendon Reflexes: Strength normal.     Comments: Arms held flexed in front of her, does move arms slowly when asked to, when asked to move her legs states she can but then does not move her legs, confused  Psychiatric:        Mood and Affect: Mood and affect normal.     ED Results / Procedures / Treatments   Labs (all labs ordered are listed, but only abnormal results are displayed) Labs Reviewed  CBC - Abnormal; Notable for the following components:      Result Value   Hemoglobin 11.7 (*)    All other components  within normal limits  COMPREHENSIVE METABOLIC PANEL - Abnormal; Notable for the following components:   Glucose, Bld 154 (*)    Calcium 10.5 (*)  Albumin 3.0 (*)    AST 10 (*)    All other components within normal limits  URINALYSIS, ROUTINE W REFLEX MICROSCOPIC - Abnormal; Notable for the following components:   APPearance TURBID (*)    Hgb urine dipstick LARGE (*)    Protein, ur 100 (*)    Leukocytes,Ua LARGE (*)    RBC / HPF >50 (*)    WBC, UA >50 (*)    Bacteria, UA MANY (*)    All other components within normal limits  I-STAT CHEM 8, ED - Abnormal; Notable for the following components:   Glucose, Bld 148 (*)    Calcium, Ion 1.41 (*)    Hemoglobin 11.9 (*)    HCT 35.0 (*)    All other components within normal limits  CBG MONITORING, ED - Abnormal; Notable for the following components:   Glucose-Capillary 119 (*)    All other components within normal limits  I-STAT BETA HCG BLOOD, ED ([facility], [facility], [facility] ONLY) - Abnormal; Notable for the following components:   I-stat hCG, quantitative 7.2 (*)    All other components within normal limits  SARS CORONAVIRUS 2 BY RT PCR (HOSPITAL ORDER, [facility])  URINE CULTURE  PROTIME-INR  APTT  DIFFERENTIAL  RAPID URINE DRUG SCREEN, HOSP PERFORMED  LACTIC ACID, PLASMA    EKG EKG Interpretation  Date/Time:  Thursday November 22 2020 15:27:15 EST Ventricular Rate:  50 PR Interval:    QRS Duration: 113 QT Interval:  519 QTC Calculation: 474 R Axis:   -63 Text Interpretation: Sinus or ectopic atrial rhythm Prolonged PR interval Abnormal R-wave progression, late transition LVH with IVCD, LAD and secondary repol abnrm Anterior ST elevation, probably due to LVH No significant change since last tracing Confirmed by Dorie Rank [phone]) on 11/22/2020 5:42:51 PM   Radiology CT HEAD WO CONTRAST  Result Date: 11/22/2020 CLINICAL DATA:  Focal neuro deficit greater than 6 hours. EXAM: CT HEAD WITHOUT CONTRAST TECHNIQUE:  Contiguous axial images were obtained from the base of the skull through the vertex without intravenous contrast. COMPARISON:  CT head 05/25/2008 FINDINGS: Brain: Generalized atrophy. Extensive white matter hypodensity bilaterally, chronic. Negative for acute infarct, hemorrhage, mass. Vascular: Negative for hyperdense vessel Skull: Negative Sinuses/Orbits: Paranasal sinuses clear. Right mastoidectomy. Left mastoid clear. Bilateral cataract extraction. Other: None IMPRESSION: Atrophy and chronic white matter changes.  No acute abnormality. Electronically Signed   By: Franchot Gallo M.D.   On: 11/22/2020 17:40   DG Chest Portable 1 View  Result Date: 11/22/2020 CLINICAL DATA:  Weakness. EXAM: PORTABLE CHEST 1 VIEW COMPARISON:  05/25/2008 FINDINGS: The heart size is mildly enlarged. Aortic calcifications are noted. There is no pneumothorax. No large pleural effusion. No acute osseous abnormality. IMPRESSION: 1. No acute cardiopulmonary disease. 2. Mild cardiomegaly. 3. Aortic calcifications. Electronically Signed   By: Constance Holster M.D.   On: 11/22/2020 16:52    Procedures Procedures   Medications Ordered in ED Medications  sodium chloride 0.9 % bolus 500 mL (0 mLs Intravenous Stopped 11/22/20 1800)    Followed by  0.9 %  sodium chloride infusion (100 mL/hr Intravenous New Bag/Given 11/22/20 1610)  cefTRIAXone (ROCEPHIN) 1 g in sodium chloride 0.9 % 100 mL IVPB (has no administration in time range)    ED Course  I have reviewed the triage vital signs and the nursing notes.  Pertinent labs & imaging results that were available during my care of the patient were reviewed by me and considered in my medical  decision making (see chart for details).  Clinical Course as of 11/22/20 1847  Thu Nov 22, 2020  1545 HCG was ordered at triage.  Not clinically indicated.  HCG [JK]  1741 Labs reviewed.  Urinalysis is consistent with a urinary tract infection. [OA]  4166 Patient has many bacteria as well as  white blood cell clumps [JK]  1741 Lactic acid level is normal. [JK]  0630 Metabolic panel does not show evidence of acute kidney injury. [ZS]  0109 CBC is normal. [JK]  1742 Chest x-ray without acute findings [JK]  1749 CT scan without acute findings [JK]  1750 Chest x-ray without acute findings [JK]  1750 Records reviewed.  Patient is scheduled for cystoscopy on February 8. [NA]  3557 Confirmed code status with Daughter.  Would be OK with intubation if she started having breathing problems.   Agrees with DNR [JK]    Clinical Course User Index [JK] Dorie Rank, MD   MDM Rules/Calculators/A&P                          Pt presents with increasing weakness.   Normally more ambulatory.  Having trouble walking now.  Not eating well.  UA is consistent with infection.    No signs of sepsis on exam.  Vitals stable.  Daughter concerned about her at home.  Will consult with medical service for possible admission, IV abx,. Final Clinical Impression(s) / ED Diagnoses Final diagnoses:  Acute cystitis without hematuria  Weakness     Dorie Rank, MD 11/22/20 Jaci Lazier, MD 11/22/20 [phone]

## 2020-11-22 NOTE — H&P (Addendum)
[place] Dan[place] ZOX:[id number] DOB: [birth year] DOA: 11/22/2020     PCP: Elizabeth PalauAnderson, Teresa, FNP   Outpatient Specialists:     Urology Dr. Ellison CarwinNewshome  Patient arrived to ER on 11/22/20 at 1518 Referred by Attending Linwood DibblesKnapp, Jon, MD   Patient coming from: home Lives  With family   Chief Complaint:   Chief Complaint  Patient presents with  . Weakness    HPI: [place] is a 87 y.o. female with medical history significant of CVA CAD, DM, HLD, HTN  Presented with progressive generalized weakness over the past few weeks usually able to walk with a walker although there are difficulties but lately have had trouble even making a few steps. Followed by urology for recurrent urinary tract infection per family last urine culture did not grow anything. Patient was getting some preop testing and had hard time getting out of a car requiring rapid response to be called. Family strongly suspects recurrent urinary tract infection.  Family states she has not had any fevers no nausea no vomiting has had declined appetite. Recently been treated with Augmentin Patient is scheduled for cystoscopy on 8 February Due to right kidney blockage per family they have attempted to take a look but could not visualize  Imaging from Jan 25th Bilateral nephrolithiasis.  Image degradation by motion artifact. Mild right hydroureteronephrosis, with asymmetric soft tissue density at the right UVJ, and several tiny calculi in urinary bladder near the right UVJ. These findings could be due to urothelial neoplasm or distal ureteral calculi. Consider CT urogram without and with contrast or cystoscopy/retrograde ureteroscopy for further evaluation.  Chronic bladder outlet obstruction, with large diverticulum in the bladder fundus.   Infectious risk factors:  Reports  severe fatigue     Has been vaccinated against COVID booster September 30, 2020   Initial COVID TEST  NEGATIVE   Lab Results  Component  Value Date   SARSCOV2NAA NEGATIVE 11/22/2020     Regarding pertinent Chronic problems    Hyperlipidemia -  on  fenofibrate Lipid Panel     Component Value Date/Time   CHOL  05/26/2008 0605    147        ATP III CLASSIFICATION:  <200     mg/dL   Desirable  [id number]  mg/dL   Borderline High  >=782>=240    mg/dL   High   TRIG 956175 (H) 21/30/865708/04/2008 0605   HDL 30 (L) 05/26/2008 0605   CHOLHDL 4.9 05/26/2008 0605   VLDL 35 05/26/2008 0605   LDLCALC  05/26/2008 0605    82        Total Cholesterol/HDL:CHD Risk Coronary Heart Disease Risk Table                     Men   Women  1/2 Average Risk   3.4   3.3  On Lasix for hypertension    CAD  - On  Plavix     Mitral and aortic valve dysfunction        DM 2 -  Lab Results  Component Value Date   HGBA1C 7.5 (H) 11/22/2020   on insulin   70/30 family is titration for every 25 point above 150 she gives her 1 unit She gets 2 unit in Am and 4 at night      Hx of CVA -  With  residual deficits on  Plavix     Dementia - on Aricept     Chronic anemia - baseline hg  Hemoglobin & Hematocrit  Recent Labs    11/22/20 1418 11/22/20 1524 11/22/20 1542  HGB 11.5* 11.7* 11.9*    While in ER: Found to have UTI started on Rocephin urine culture obtained Lactic acid within normal limits no evidence of AKI or leukocytosis Chest x-ray unremarkable CT head unremarkable Covid negative   Hospitalist was called for admission for debility and possible UTi  The following Work up has been ordered so far:  Orders Placed This Encounter  Procedures  . SARS Coronavirus 2 by RT PCR (hospital order, performed in [facility] hospital lab) Nasopharyngeal Nasopharyngeal Swab  . Urine Culture  . CT HEAD WO CONTRAST  . DG Chest Portable 1 View  . Protime-INR  . APTT  . CBC  . Differential  . Comprehensive metabolic panel  . Urine rapid drug screen (hosp performed)not at [facility]  . Urinalysis, Routine w reflex microscopic  . Diet NPO time specified  . CT  not indicated at this time  . Cardiac Monitoring  . Modified Stroke Scale (mNIHSS) Document mNIHSS assessment every 2 hours for a total of 12 hours  . Stroke swallow screen  . Initiate Carrier Fluid Protocol  . If O2 sat If O2 Sat < 94%, administer O2 at 2 liters/minute via nasal cannula.  . Do not attempt resuscitation/DNR  . Consult to hospitalist  ALL PATIENTS BEING ADMITTED/HAVING PROCEDURES NEED COVID-19 SCREENING  . Pulse oximetry, continuous  . I-stat chem 8, ED  . CBG monitoring, ED  . I-Stat beta hCG blood, ED ([facility], [facility], [facility] only)  . ED EKG  . Saline lock IV     Following Medications were ordered in ER: Medications  sodium chloride 0.9 % bolus 500 mL (0 mLs Intravenous Stopped 11/22/20 1800)    Followed by  0.9 %  sodium chloride infusion (100 mL/hr Intravenous New Bag/Given 11/22/20 1610)  cefTRIAXone (ROCEPHIN) 1 g in sodium chloride 0.9 % 100 mL IVPB (has no administration in time range)        Consult Orders  (From admission, onward)         Start     Ordered   11/22/20 1823  Consult to hospitalist  ALL PATIENTS BEING ADMITTED/HAVING PROCEDURES NEED COVID-19 SCREENING  Once       Comments: ALL PATIENTS BEING ADMITTED/HAVING PROCEDURES NEED COVID-19 SCREENING  Provider:  (Not yet assigned)  Question Answer Comment  Place call to: [facility]   Reason for Consult Admit      11/22/20 1822           Significant initial  Findings: Abnormal Labs Reviewed  CBC - Abnormal; Notable for the following components:      Result Value   Hemoglobin 11.7 (*)    All other components within normal limits  COMPREHENSIVE METABOLIC PANEL - Abnormal; Notable for the following components:   Glucose, Bld 154 (*)    Calcium 10.5 (*)    Albumin 3.0 (*)    AST 10 (*)    All other components within normal limits  URINALYSIS, ROUTINE W REFLEX MICROSCOPIC - Abnormal; Notable for the following components:   APPearance TURBID (*)    Hgb urine dipstick LARGE (*)    Protein, ur  100 (*)    Leukocytes,Ua LARGE (*)    RBC / HPF >50 (*)    WBC, UA >50 (*)    Bacteria, UA MANY (*)    All other components within normal limits  I-STAT CHEM 8, ED - Abnormal; Notable for  the following components:   Glucose, Bld 148 (*)    Calcium, Ion 1.41 (*)    Hemoglobin 11.9 (*)    HCT 35.0 (*)    All other components within normal limits  CBG MONITORING, ED - Abnormal; Notable for the following components:   Glucose-Capillary 119 (*)    All other components within normal limits  I-STAT BETA HCG BLOOD, ED ([facility], [facility], [facility] ONLY) - Abnormal; Notable for the following components:   I-stat hCG, quantitative 7.2 (*)    All other components within normal limits    Otherwise labs showing:  Recent Labs  Lab 11/22/20 1418 11/22/20 1524 11/22/20 1542  NA 137 138 137  K 3.6 3.9 3.9  CO2 26 26  --   GLUCOSE 140* 154* 148*  BUN 16 17 17   CREATININE 0.73 0.82 0.70  CALCIUM 10.2 10.5*  --     Cr   Stable,  Lab Results  Component Value Date   CREATININE 0.70 11/22/2020   CREATININE 0.82 11/22/2020   CREATININE 0.73 11/22/2020    Recent Labs  Lab 11/22/20 1524  AST 10*  ALT 9  ALKPHOS 44  BILITOT 0.7  PROT 7.0  ALBUMIN 3.0*   Lab Results  Component Value Date   CALCIUM 10.5 (H) 11/22/2020      WBC      Component Value Date/Time   WBC 7.2 11/22/2020 1524   LYMPHSABS 1.9 11/22/2020 1524   MONOABS 0.8 11/22/2020 1524   EOSABS 0.2 11/22/2020 1524   BASOSABS 0.0 11/22/2020 1524    Plt: Lab Results  Component Value Date   PLT 332 11/22/2020    Lactic Acid, Venous    Component Value Date/Time   LATICACIDVEN 1.0 11/22/2020 1615     HG/HCT stable,      Component Value Date/Time   HGB 11.9 (L) 11/22/2020 1542   HCT 35.0 (L) 11/22/2020 1542   MCV 92.9 11/22/2020 1524       Cardiac Panel (last 3 results)   ECG: Ordered Personally reviewed by me showing: HR : 50 Rhythm:  SR ectopic referral prolonged PR secondary report normality LVH ,    no evidence of  ischemic changes QTC 474   BNP (last 3 results) No results for input(s): BNP in the last 8760 hours.    DM  labs:  HbA1C: Recent Labs    11/22/20 1418  HGBA1C 7.5*     CBG (last 3)  Recent Labs    11/22/20 1324 11/22/20 1537  GLUCAP 148* 119*     UA evidence of UTI     Urine analysis:    Component Value Date/Time   COLORURINE YELLOW 11/22/2020 1603   APPEARANCEUR TURBID (A) 11/22/2020 1603   LABSPEC 1.010 11/22/2020 1603   PHURINE 6.0 11/22/2020 1603   GLUCOSEU NEGATIVE 11/22/2020 1603   HGBUR LARGE (A) 11/22/2020 1603   BILIRUBINUR NEGATIVE 11/22/2020 1603   KETONESUR NEGATIVE 11/22/2020 1603   PROTEINUR 100 (A) 11/22/2020 1603   UROBILINOGEN 0.2 05/26/2008 0817   NITRITE NEGATIVE 11/22/2020 1603   LEUKOCYTESUR LARGE (A) 11/22/2020 1603      Ordered  CT HEAD  NON acute  CXR -  NON acute   ED Triage Vitals  Enc Vitals Group     BP 11/22/20 1522 (!) 131/55     Pulse Rate 11/22/20 1522 (!) 51     Resp 11/22/20 1522 18     Temp 11/22/20 1524 97.9 F (36.6 C)     Temp Source  11/22/20 1524 Oral     SpO2 11/22/20 1522 98 %     Weight --      Height --      Head Circumference --      Peak Flow --      Pain Score 11/22/20 1523 0     Pain Loc --      Pain Edu? --      Excl. in GC? --   TMAX(24)@       Latest  Blood pressure (!) 150/56, pulse (!) 53, temperature 97.9 F (36.6 C), temperature source Oral, resp. rate 16, SpO2 100 %.    Review of Systems:    Pertinent positives include:    Fatigue,  confusion  Constitutional:  No weight loss, night sweats, Fevers, chills, weight loss  HEENT:  No headaches, Difficulty swallowing,Tooth/dental problems,Sore throat,  No sneezing, itching, ear ache, nasal congestion, post nasal drip,  Cardio-vascular:  No chest pain, Orthopnea, PND, anasarca, dizziness, palpitations.no Bilateral lower extremity swelling  GI:  No heartburn, indigestion, abdominal pain, nausea, vomiting, diarrhea, change in bowel habits,  loss of appetite, melena, blood in stool, hematemesis Resp:  no shortness of breath at rest. No dyspnea on exertion, No excess mucus, no productive cough, No non-productive cough, No coughing up of blood. No change in color of mucus.No wheezing. Skin:  no rash or lesions. No jaundice GU:  no dysuria, change in color of urine, no urgency or frequency. No straining to urinate.  No flank pain.  Musculoskeletal:  No joint pain or no joint swelling. No decreased range of motion. No back pain.  Psych:  No change in mood or affect. No depression or anxiety. No memory loss.  Neuro: no localizing neurological complaints, no tingling, no weakness, no double vision, no gait abnormality, no slurred speech, no  All systems reviewed and apart from HOPI all are negative  Past Medical History:   Past Medical History:  Diagnosis Date  . Cancer (HCC)    skin  . Coronary artery disease   . Diabetes mellitus   . Diabetic retinopathy   . Dyslipidemia   . H/O: CVA (cardiovascular accident)   . Hearing loss   . Heart murmur   . History of kidney stones   . Hypertension   . Stroke [facility]) 03/2020   x5     Past Surgical History:  Procedure Laterality Date  . CESAREAN SECTION    . EYE SURGERY Bilateral   . hysterectomy - unknown type    . MASTOIDECTOMY     as a child    Social History:  Ambulatory  Dan Humphreys     reports that she has never smoked. She has never used smokeless tobacco. She reports that she does not drink alcohol and does not use drugs.     Family History:   Family History  Problem Relation Age of Onset  . Prostate cancer Other     Allergies: Allergies  Allergen Reactions  . Codeine     Syncope      Prior to Admission medications   Medication Sig Start Date End Date Taking? Authorizing Provider  amoxicillin-clavulanate (AUGMENTIN) 500-125 MG tablet Take 1 tablet by mouth 2 (two) times daily. 11/19/20   [provider]  aspirin 81 MG tablet Take 81 mg by  mouth at bedtime.    [provider]  citalopram (CELEXA) 20 MG tablet Take 20 mg by mouth at bedtime.    [provider]  clopidogrel (PLAVIX) 75 MG  tablet Take 75 mg by mouth daily.    [provider]  CRANBERRY PO Take 4,200 mg by mouth 2 (two) times daily.    [provider]  donepezil (ARICEPT) 10 MG tablet Take 10 mg by mouth at bedtime.    [provider]  fenofibrate 160 MG tablet Take 40 mg by mouth at bedtime.    [provider]  furosemide (LASIX) 20 MG tablet Take 10 mg by mouth daily.    [provider]  insulin NPH-insulin regular (NOVOLIN 70/30) (70-30) 100 UNIT/ML injection Inject 1-7 Units into the skin 2 (two) times daily as needed (high blood sugar).    [provider]  mupirocin ointment (BACTROBAN) 2 % Apply 1 application topically 2 (two) times daily as needed for wound care. 09/25/20   [provider]   Physical Exam: Vitals with BMI 11/22/2020 11/22/2020 11/22/2020  Height - - -  Weight - - -  BMI - - -  Systolic 502 774 128  Diastolic 56 51 56  Pulse 53 56 49     1. General:  in No Acute distress   Chronically ill  -appearing 2. Psychological: Alert but not Oriented  3. Head/ENT:     Dry Mucous Membranes                          Head Non traumatic, neck supple                            Poor Dentition 4. SKIN:   decreased Skin turgor,  Skin clean Dry and intact no rash 5. Heart: Regular rate and rhythm ?Murmur, no Rub or gallop 6. Lungs: no wheezes or crackles   7. Abdomen: Soft,  non-tender, Non distended  bowel sounds present 8. Lower extremities: no clubbing, cyanosis, no  edema 9. Neurologically Grossly intact, moving all 4 extremities equally  10. MSK: Normal range of motion, multiple contractures   All other LABS:     Recent Labs  Lab 11/22/20 1418 11/22/20 1524 11/22/20 1542  WBC 7.0 7.2  --   NEUTROABS  --  4.3  --   HGB 11.5* 11.7* 11.9*  HCT 36.5 36.9 35.0*  MCV  93.1 92.9  --   PLT 341 332  --      Recent Labs  Lab 11/22/20 1418 11/22/20 1524 11/22/20 1542  NA 137 138 137  K 3.6 3.9 3.9  CL 101 101 101  CO2 26 26  --   GLUCOSE 140* 154* 148*  BUN 16 17 17   CREATININE 0.73 0.82 0.70  CALCIUM 10.2 10.5*  --      Recent Labs  Lab 11/22/20 1524  AST 10*  ALT 9  ALKPHOS 44  BILITOT 0.7  PROT 7.0  ALBUMIN 3.0*       Cultures: No results found for: SDES, SPECREQUEST, CULT, REPTSTATUS   Radiological Exams on Admission: CT HEAD WO CONTRAST  Result Date: 11/22/2020 CLINICAL DATA:  Focal neuro deficit greater than 6 hours. EXAM: CT HEAD WITHOUT CONTRAST TECHNIQUE: Contiguous axial images were obtained from the base of the skull through the vertex without intravenous contrast. COMPARISON:  CT head 05/25/2008 FINDINGS: Brain: Generalized atrophy. Extensive white matter hypodensity bilaterally, chronic. Negative for acute infarct, hemorrhage, mass. Vascular: Negative for hyperdense vessel Skull: Negative Sinuses/Orbits: Paranasal sinuses clear. Right mastoidectomy. Left mastoid clear. Bilateral cataract extraction. Other: None IMPRESSION: Atrophy and chronic  white matter changes.  No acute abnormality. Electronically Signed   By: Franchot Gallo M.D.   On: 11/22/2020 17:40   DG Chest Portable 1 View  Result Date: 11/22/2020 CLINICAL DATA:  Weakness. EXAM: PORTABLE CHEST 1 VIEW COMPARISON:  05/25/2008 FINDINGS: The heart size is mildly enlarged. Aortic calcifications are noted. There is no pneumothorax. No large pleural effusion. No acute osseous abnormality. IMPRESSION: 1. No acute cardiopulmonary disease. 2. Mild cardiomegaly. 3. Aortic calcifications. Electronically Signed   By: Constance Holster M.D.   On: 11/22/2020 16:52    Chart has been reviewed    Assessment/Plan   87 y.o. female with medical history significant of CVA CAD, DM, HLD, HTN  Admitted for UTI and debility  Present on Admission: . UTI (urinary tract infection) -  -  treat with Rocephin         await results of urine culture and adjust antibiotic coverage as needed Given history of nephrolithiasis and right ureteral obstruction discussed with urology who aware the patient will see tomorrow in consult At this point no evidence of sepsis no indication for emergent procedure  . Debility PT OT evaluation  . Dementia ([place]) resume home medications when able to tolerate monitor for any sign of sundowning  abnormal cardiac valves -Per daughter patient was not a candidate for surgical interventions in [place] continue to monitor Given patient may need surgical interventions will obtain echo  DM 2 -  - Order Sensitive  SSI   -hold home insulin regimen    -  check TSH and HgA1C    History of CVA hold off Plavix as patient may need procedures Other plan as per orders.  DVT prophylaxis:  SCD      Code Status:    Code Status: Partial Code  limitted code ok to do intubation, shock, Bipap, pressors comfort care as per  family  I had personally discussed CODE STATUS with family    Family Communication:   Family   at  Bedside  plan of care was discussed on the phone with  Daughter  Disposition Plan:       To home once workup is complete and patient is stable   Following barriers for discharge:                           able to transition to PO antibiotics                             Will need to be able to tolerate PO                            Will likely need home health,                                            Would benefit from PT/OT eval prior to DC  Ordered                                      Nutrition    consulted  Consults called:  Consulted   Urology Dr. Lovena Neighbours is aware will pass on to Dr. Nehemiah Massed who will see tomorrow  Admission status:  ED Disposition    ED Disposition Condition [place]: [place] [[id number]]  Level of Care: Telemetry [5]  Admit to tele based on  following criteria: Monitor QTC interval  Covid Evaluation: Confirmed COVID Negative  Diagnosis: UTI (urinary tract infection) [id number]  Admitting Physician: Toy Baker [[id number]  Attending Physician: Toy Baker [[id number]]       Obs    Level of care     tele  For 24H    Lab Results  Component Value Date   [place] NEGATIVE 11/22/2020     Precautions: admitted as Covid Negative    PPE: Used by the provider:   N95   eye Goggles,  Gloves    Lucia Harm 11/22/2020, 11:28 PM    [facility]     after 2 AM please page floor coverage PA If 7AM-7PM, please contact the day team taking care of the patient using [email or web address]   Patient was evaluated in the context of the global COVID-19 pandemic, which necessitated consideration that the patient might be at risk for infection with the SARS-CoV-2 virus that causes COVID-19. Institutional protocols and algorithms that pertain to the evaluation of patients at risk for COVID-19 are in a state of rapid change based on information released by regulatory bodies including the CDC and federal and state organizations. These policies and algorithms were followed during the patient's care.

## 2020-11-22 NOTE — ED Triage Notes (Signed)
Patient BIB rapid response, RR said she has a hx of strokes, might have had one today. Equal but weak grip strength upper extremities, no response in lower extremities. Daughter is worried about infection / UTI. Pt able to say her name.

## 2020-11-23 ENCOUNTER — Observation Stay (HOSPITAL_COMMUNITY): Payer: Medicare Other

## 2020-11-23 ENCOUNTER — Inpatient Hospital Stay (HOSPITAL_COMMUNITY): Admission: RE | Admit: 2020-11-23 | Payer: Medicare Other | Source: Ambulatory Visit

## 2020-11-23 DIAGNOSIS — I2510 Atherosclerotic heart disease of native coronary artery without angina pectoris: Secondary | ICD-10-CM | POA: Diagnosis present

## 2020-11-23 DIAGNOSIS — N136 Pyonephrosis: Secondary | ICD-10-CM | POA: Diagnosis present

## 2020-11-23 DIAGNOSIS — Z20822 Contact with and (suspected) exposure to covid-19: Secondary | ICD-10-CM | POA: Diagnosis present

## 2020-11-23 DIAGNOSIS — E11319 Type 2 diabetes mellitus with unspecified diabetic retinopathy without macular edema: Secondary | ICD-10-CM | POA: Diagnosis present

## 2020-11-23 DIAGNOSIS — L89151 Pressure ulcer of sacral region, stage 1: Secondary | ICD-10-CM | POA: Diagnosis present

## 2020-11-23 DIAGNOSIS — D62 Acute posthemorrhagic anemia: Secondary | ICD-10-CM | POA: Diagnosis not present

## 2020-11-23 DIAGNOSIS — E785 Hyperlipidemia, unspecified: Secondary | ICD-10-CM | POA: Diagnosis present

## 2020-11-23 DIAGNOSIS — N323 Diverticulum of bladder: Secondary | ICD-10-CM | POA: Diagnosis present

## 2020-11-23 DIAGNOSIS — N029 Recurrent and persistent hematuria with unspecified morphologic changes: Secondary | ICD-10-CM | POA: Diagnosis present

## 2020-11-23 DIAGNOSIS — H9190 Unspecified hearing loss, unspecified ear: Secondary | ICD-10-CM | POA: Diagnosis present

## 2020-11-23 DIAGNOSIS — H548 Legal blindness, as defined in USA: Secondary | ICD-10-CM | POA: Diagnosis present

## 2020-11-23 DIAGNOSIS — N320 Bladder-neck obstruction: Secondary | ICD-10-CM | POA: Diagnosis present

## 2020-11-23 DIAGNOSIS — I350 Nonrheumatic aortic (valve) stenosis: Secondary | ICD-10-CM

## 2020-11-23 DIAGNOSIS — Z90710 Acquired absence of both cervix and uterus: Secondary | ICD-10-CM | POA: Diagnosis not present

## 2020-11-23 DIAGNOSIS — Z87442 Personal history of urinary calculi: Secondary | ICD-10-CM | POA: Diagnosis not present

## 2020-11-23 DIAGNOSIS — G9341 Metabolic encephalopathy: Secondary | ICD-10-CM | POA: Diagnosis present

## 2020-11-23 DIAGNOSIS — I10 Essential (primary) hypertension: Secondary | ICD-10-CM | POA: Diagnosis present

## 2020-11-23 DIAGNOSIS — I313 Pericardial effusion (noninflammatory): Secondary | ICD-10-CM | POA: Diagnosis present

## 2020-11-23 DIAGNOSIS — N390 Urinary tract infection, site not specified: Secondary | ICD-10-CM | POA: Diagnosis not present

## 2020-11-23 DIAGNOSIS — R531 Weakness: Secondary | ICD-10-CM | POA: Diagnosis present

## 2020-11-23 DIAGNOSIS — Z87440 Personal history of urinary (tract) infections: Secondary | ICD-10-CM | POA: Diagnosis not present

## 2020-11-23 DIAGNOSIS — F0391 Unspecified dementia with behavioral disturbance: Secondary | ICD-10-CM | POA: Diagnosis not present

## 2020-11-23 DIAGNOSIS — Z8673 Personal history of transient ischemic attack (TIA), and cerebral infarction without residual deficits: Secondary | ICD-10-CM | POA: Diagnosis not present

## 2020-11-23 DIAGNOSIS — N1370 Vesicoureteral-reflux, unspecified: Secondary | ICD-10-CM | POA: Diagnosis present

## 2020-11-23 DIAGNOSIS — I080 Rheumatic disorders of both mitral and aortic valves: Secondary | ICD-10-CM | POA: Diagnosis present

## 2020-11-23 DIAGNOSIS — F0390 Unspecified dementia without behavioral disturbance: Secondary | ICD-10-CM | POA: Diagnosis not present

## 2020-11-23 DIAGNOSIS — R319 Hematuria, unspecified: Secondary | ICD-10-CM | POA: Diagnosis not present

## 2020-11-23 DIAGNOSIS — E119 Type 2 diabetes mellitus without complications: Secondary | ICD-10-CM | POA: Diagnosis not present

## 2020-11-23 DIAGNOSIS — R011 Cardiac murmur, unspecified: Secondary | ICD-10-CM | POA: Diagnosis present

## 2020-11-23 DIAGNOSIS — E876 Hypokalemia: Secondary | ICD-10-CM | POA: Diagnosis present

## 2020-11-23 LAB — CBC WITH DIFFERENTIAL/PLATELET
Abs Immature Granulocytes: 0.02 10*3/uL (ref 0.00–0.07)
Basophils Absolute: 0 10*3/uL (ref 0.0–0.1)
Basophils Relative: 0 %
Eosinophils Absolute: 0.2 10*3/uL (ref 0.0–0.5)
Eosinophils Relative: 4 %
HCT: 32.5 % — ABNORMAL LOW (ref 36.0–46.0)
Hemoglobin: 10.4 g/dL — ABNORMAL LOW (ref 12.0–15.0)
Immature Granulocytes: 0 %
Lymphocytes Relative: 28 %
Lymphs Abs: 1.9 10*3/uL (ref 0.7–4.0)
MCH: 29.2 pg (ref 26.0–34.0)
MCHC: 32 g/dL (ref 30.0–36.0)
MCV: 91.3 fL (ref 80.0–100.0)
Monocytes Absolute: 0.7 10*3/uL (ref 0.1–1.0)
Monocytes Relative: 11 %
Neutro Abs: 3.7 10*3/uL (ref 1.7–7.7)
Neutrophils Relative %: 57 %
Platelets: 303 10*3/uL (ref 150–400)
RBC: 3.56 MIL/uL — ABNORMAL LOW (ref 3.87–5.11)
RDW: 13.8 % (ref 11.5–15.5)
WBC: 6.5 10*3/uL (ref 4.0–10.5)
nRBC: 0 % (ref 0.0–0.2)

## 2020-11-23 LAB — ECHOCARDIOGRAM COMPLETE
AR max vel: 1.21 cm2
AV Area VTI: 0.84 cm2
AV Area mean vel: 0.87 cm2
AV Mean grad: 38.3 mmHg
AV Peak grad: 63.1 mmHg
Ao pk vel: 3.97 m/s
Area-P 1/2: 1.5 cm2
Calc EF: 63.6 %
Height: 64 in
MV M vel: 5.8 m/s
MV Peak grad: 134.6 mmHg
MV VTI: 1.35 cm2
Radius: 0.8 cm
S' Lateral: 1.7 cm
Single Plane A2C EF: 61.7 %
Single Plane A4C EF: 62.4 %
Weight: 2112 oz

## 2020-11-23 LAB — COMPREHENSIVE METABOLIC PANEL
ALT: 8 U/L (ref 0–44)
AST: 9 U/L — ABNORMAL LOW (ref 15–41)
Albumin: 2.5 g/dL — ABNORMAL LOW (ref 3.5–5.0)
Alkaline Phosphatase: 37 U/L — ABNORMAL LOW (ref 38–126)
Anion gap: 11 (ref 5–15)
BUN: 13 mg/dL (ref 8–23)
CO2: 26 mmol/L (ref 22–32)
Calcium: 9.6 mg/dL (ref 8.9–10.3)
Chloride: 102 mmol/L (ref 98–111)
Creatinine, Ser: 0.63 mg/dL (ref 0.44–1.00)
GFR, Estimated: >60 mL/min (ref >60)
Glucose, Bld: 122 mg/dL — ABNORMAL HIGH (ref 70–99)
Potassium: 3.3 mmol/L — ABNORMAL LOW (ref 3.5–5.1)
Sodium: 139 mmol/L (ref 135–145)
Total Bilirubin: 0.4 mg/dL (ref 0.3–1.2)
Total Protein: 6.1 g/dL — ABNORMAL LOW (ref 6.5–8.1)

## 2020-11-23 LAB — CBG MONITORING, ED
Glucose-Capillary: 103 mg/dL — ABNORMAL HIGH (ref 70–99)
Glucose-Capillary: 129 mg/dL — ABNORMAL HIGH (ref 70–99)
Glucose-Capillary: 95 mg/dL (ref 70–99)
Glucose-Capillary: 171 mg/dL — ABNORMAL HIGH (ref 70–99)

## 2020-11-23 LAB — CK: Total CK: 20 U/L — ABNORMAL LOW (ref 38–234)

## 2020-11-23 LAB — MAGNESIUM
Magnesium: 1.9 mg/dL (ref 1.7–2.4)
Magnesium: 1.9 mg/dL (ref 1.7–2.4)

## 2020-11-23 LAB — HEMOGLOBIN A1C
Hgb A1c MFr Bld: 7.5 % — ABNORMAL HIGH (ref 4.8–5.6)
Mean Plasma Glucose: 168.55 mg/dL

## 2020-11-23 LAB — PHOSPHORUS
Phosphorus: 2.3 mg/dL — ABNORMAL LOW (ref 2.5–4.6)
Phosphorus: 2.3 mg/dL — ABNORMAL LOW (ref 2.5–4.6)

## 2020-11-23 LAB — TSH: TSH: 1.392 u[IU]/mL (ref 0.350–4.500)

## 2020-11-23 LAB — GLUCOSE, CAPILLARY: Glucose-Capillary: 104 mg/dL — ABNORMAL HIGH (ref 70–99)

## 2020-11-23 MED ORDER — POTASSIUM & SODIUM PHOSPHATES 280-160-250 MG PO PACK
1.0000 | PACK | Freq: Three times a day (TID) | ORAL | Status: AC
  Administered 2020-11-23 (×2): 1 via ORAL
  Filled 2020-11-23 (×3): qty 1

## 2020-11-23 MED ORDER — SODIUM CHLORIDE 0.9 % IV SOLN
1.0000 g | INTRAVENOUS | Status: DC
  Administered 2020-11-23 – 2020-11-24 (×2): 1 g via INTRAVENOUS
  Filled 2020-11-23: qty 1
  Filled 2020-11-23: qty 10
  Filled 2020-11-23: qty 1

## 2020-11-23 NOTE — Progress Notes (Signed)
PROGRESS NOTE    [name]  [id number] DOB: 1929/03/05 DOA: 11/22/2020 PCP: Rebecca Aly, FNP   Chief Complaint  Patient presents with  . Weakness   Brief Narrative:  87 year old female with history of CVA, CAD, diabetes, hyperlipidemia, hypertension, bilateral nephrolithiasis with mild right hydroureteronephrosis -due to urothelial neoplasm or distal ureteral calculi based on Jan 25 imaging, chronic bladder outlet obstruction with large diverticulum in the bladder fundus, who has been brought with progressive generalized weakness over the past few weeks.  As per the report she is usually able to walk with a walker although there are difficulties but lately have had trouble even making a few steps. Followed by urology for recurrent urinary tract infection per family last urine culture did not grow anything. Patient was getting some preop testing  and had hard time getting out of a car requiring rapid response to be called. Family strongly suspects recurrent urinary tract infection. Family states she has not had any fevers no nausea no vomiting has had declined appetite. Recently been treated with Augmentin, Patient is scheduled for cystoscopy on 8 February due to "right kidney blockage"  In the ED found to have UTI and admitted for UTI and debility.  Subjective: Seen and examined this morning, she was getting her echocardiogram done, appears weak frail very hard of hearing and very slow to respond able to tell her name and date of birth, also legally blind.  Labs  k- 3.3 Phos 2.3  Assessment & Plan:  UTI, with history of recurrent UTI/chronic bladder outlet obstruction with large diverticulum in the bladder fundus and bilateral nephrolithiasis with mild right hydroureteronephrosis, for which she was being worked up with cystoscopy for February 8 to rule out distal  ureteral calculi versus urothelial neoplasm. No leukocytosis or lactic acidosis.Urology was consulted on  admission, continue ceftriaxone. She has had 9-10 rounds of antibiotics since October and as soon as she is off antibiotis she does poorly . Discussed w Dr Rebecca Pruitt to cont on antibiotics and await on culture. Her urine culture from his office 1/31 and 10/25/20 no growth on cath urine sample, had ESBL E coli from pcp office from voided urine (less likely accurate), he is okay w/ Rocephin. May need inpatient procedure Dr Rebecca Pruitt will f/u to asses. Recent Labs  Lab 11/22/20 1418 11/22/20 1524 11/22/20 1615 11/23/20 0643  WBC 7.0 7.2  --  6.5  LATICACIDVEN  --   --  1.0  --    Debility, with multiple comorbidities PT OT evaluation as tolerated, was able to walk 1101 ft 3 wks ago, and she declines once she is off antibiotics.  Dementia/very hard of hearing/slow to respond  And confused for few days suspect acute metabolic encephalopathy from [id number] supportive measures home medication fall precaution.  Very hard of hearing- better on left, and is legally blind  Abnormal cardiac valves-as per daughter not a candidate for surgical intervention while in [place] echocardiogram has been ordered here.  DM2 : Continue sliding scale insulin, hold home insulin regimen, hemoglobin A1c stable 7.5 blood sugar controlled. Recent Labs  Lab 11/22/20 1324 11/22/20 1537 11/23/20 0019 11/23/20 0736  GLUCAP 148* 119* 129* 103*   Hypokalemia/hypophosphatemia we will replete with K-Phos.  HTN on lasix-hold.  History of CAD on plavix/abnormal cardiac valves/hyperlipidemia, echocardiogram pending, continue fibroids.  Hx of CVA- on plavix  held on admission pending urology evaluation  Nutrition: Diet Order            Diet Carb Modified Fluid consistency:  Thin; Room service appropriate? Yes  Diet effective now                 Pt's There is no height or weight on file to calculate BMI.  DVT prophylaxis: SCDs Start: 11/22/20 2313 Code Status:   Code Status: Partial Code-confirmed with the  daughter no CPR but wants to proceed with rest of the care in code. Family Communication: plan of care discussed with patient's daughter Rebecca [place] Ms. Rebecca Pruitt  Status is: admitted as Observation Patient remains hospitalized, will need at least 2 midnight stay for ongoing management of her for UTI/debility Dispo: The patient is from: Home  W/ Daughter              Anticipated d/c is to: Home              Anticipated d/c date is: 2 days              Patient currently is not medically stable to d/c.   Difficult to place patient No  Consultants: Urology  Procedures:see note  Culture/Microbiology No results found for: SDES, SPECREQUEST, CULT, REPTSTATUS  Other culture-see note  Medications: Scheduled Meds: . citalopram  20 mg Oral QHS  . insulin aspart  0-9 Units Subcutaneous Q4H  . potassium & sodium phosphates  1 packet Oral TID WC & HS   Continuous Infusions: . sodium chloride Stopped (11/23/20 0017)  . cefTRIAXone (ROCEPHIN)  IV      Antimicrobials: Anti-infectives (From admission, onward)   Start     Dose/Rate Route Frequency Ordered Stop   11/23/20 1800  cefTRIAXone (ROCEPHIN) 1 g in sodium chloride 0.9 % 100 mL IVPB        1 g 200 mL/hr over 30 Minutes Intravenous Every 24 hours 11/23/20 1159 11/26/20 1759   11/22/20 1845  cefTRIAXone (ROCEPHIN) 1 g in sodium chloride 0.9 % 100 mL IVPB        1 g 200 mL/hr over 30 Minutes Intravenous  Once 11/22/20 1841 11/22/20 1927     Objective: Vitals: Today's Vitals   11/23/20 0400 11/23/20 0600 11/23/20 0900 11/23/20 1200  BP: 140/60 (!) 142/66 (!) 141/99 (!) 151/59  Pulse: (!) 51 (!) 50 (!) 53 (!) 55  Resp: 11 16 14 16   Temp:      TempSrc:      SpO2: 100% 100% 100% 100%  PainSc:        Intake/Output Summary (Last 24 hours) at 11/23/2020 1208 Last data filed at 11/23/2020 0843 Gross per 24 hour  Intake 1184.09 ml  Output 1000 ml  Net 184.09 ml   There were no vitals filed for this visit. Weight change:    Intake/Output from previous day: 02/03 0701 - 02/04 0700 In: 1184.1 [I.V.:584.1; IV Piggyback:600] Out: -  Intake/Output this shift: Total I/O In: -  Out: 1000 [Urine:1000] There were no vitals filed for this visit.  Examination: General exam: AAOx1, hard of hearing elderly chronically sick looking HEENT:Oral mucosa moist, Ear/Nose WNL grossly,dentition normal. Respiratory system: bilaterally diminished,no wheezing or crackles,no use of accessory muscle, non tender. Cardiovascular system: S1 & S2 +, regular, No JVD. Gastrointestinal system: Abdomen soft, NT,ND, BS+. Nervous System:Alert, awake, oriented to self, hard of hearing, Extremities: No edema, distal peripheral pulses palpable.  Skin: No rashes,no icterus. MSK: Normal muscle bulk,tone, power   Data Reviewed: I have personally reviewed following labs and imaging studies CBC: Recent Labs  Lab 11/22/20 1418 11/22/20 1524 11/22/20 1542 11/23/20 0643  WBC 7.0  7.2  --  6.5  NEUTROABS  --  4.3  --  3.7  HGB 11.5* 11.7* 11.9* 10.4*  HCT 36.5 36.9 35.0* 32.5*  MCV 93.1 92.9  --  91.3  PLT 341 332  --  381   Basic Metabolic Panel: Recent Labs  Lab 11/22/20 1418 11/22/20 1524 11/22/20 1542 11/23/20 0643  NA 137 138 137 139  K 3.6 3.9 3.9 3.3*  CL 101 101 101 102  CO2 26 26  --  26  GLUCOSE 140* 154* 148* 122*  BUN 16 17 17 13   CREATININE 0.73 0.82 0.70 0.63  CALCIUM 10.2 10.5*  --  9.6  MG  --   --   --  1.9  1.9  PHOS  --   --   --  2.3*  2.3*   GFR: Estimated Creatinine Clearance: 41.2 mL/min (by C-G formula based on SCr of 0.63 mg/dL). Liver Function Tests: Recent Labs  Lab 11/22/20 1524 11/23/20 0643  AST 10* 9*  ALT 9 8  ALKPHOS 44 37*  BILITOT 0.7 0.4  PROT 7.0 6.1*  ALBUMIN 3.0* 2.5*   No results for input(s): LIPASE, AMYLASE in the last 168 hours. No results for input(s): AMMONIA in the last 168 hours. Coagulation Profile: Recent Labs  Lab 11/22/20 1524  INR 1.1   Cardiac  Enzymes: Recent Labs  Lab 11/23/20 0643  CKTOTAL 20*   BNP (last 3 results) No results for input(s): PROBNP in the last 8760 hours. HbA1C: Recent Labs    11/22/20 1418 11/23/20 0643  HGBA1C 7.5* 7.5*   CBG: Recent Labs  Lab 11/22/20 1324 11/22/20 1537 11/23/20 0019 11/23/20 0736  GLUCAP 148* 119* 129* 103*   Lipid Profile: No results for input(s): CHOL, HDL, LDLCALC, TRIG, CHOLHDL, LDLDIRECT in the last 72 hours. Thyroid Function Tests: Recent Labs    11/23/20 0643  TSH 1.392   Anemia Panel: No results for input(s): VITAMINB12, FOLATE, FERRITIN, TIBC, IRON, RETICCTPCT in the last 72 hours. Sepsis Labs: Recent Labs  Lab 11/22/20 1615  LATICACIDVEN 1.0    Recent Results (from the past 240 hour(s))  SARS Coronavirus 2 by RT PCR (hospital order, performed in [facility] hospital lab) Nasopharyngeal Nasopharyngeal Swab     Status: None   Collection Time: 11/22/20  4:21 PM   Specimen: Nasopharyngeal Swab  Result Value Ref Range Status   SARS Coronavirus 2 NEGATIVE NEGATIVE Final    Comment: (NOTE) SARS-CoV-2 target nucleic acids are NOT DETECTED.  The SARS-CoV-2 RNA is generally detectable in upper and lower respiratory specimens during the acute phase of infection. The lowest concentration of SARS-CoV-2 viral copies this assay can detect is 250 copies / mL. A negative result does not preclude SARS-CoV-2 infection and should not be used as the sole basis for treatment or other patient management decisions.  A negative result may occur with improper specimen collection / handling, submission of specimen other than nasopharyngeal swab, presence of viral mutation(s) within the areas targeted by this assay, and inadequate number of viral copies (<250 copies / mL). A negative result must be combined with clinical observations, patient history, and epidemiological information.  Fact Sheet for Patients:   StrictlyIdeas.no  Fact Sheet for  Healthcare Providers: BankingDealers.co.za  This test is not yet approved or  cleared by the [place] FDA and has been authorized for detection and/or diagnosis of SARS-CoV-2 by FDA under an Emergency Use Authorization (EUA).  This EUA will remain in effect (meaning this test can be  used) for the duration of the COVID-19 declaration under Section 564(b)(1) of the Act, 21 U.S.C. section 360bbb-3(b)(1), unless the authorization is terminated or revoked sooner.  Performed at [facility], [place] [address], [place], [place] [address]      Radiology Studies: CT HEAD WO CONTRAST  Result Date: 11/22/2020 CLINICAL DATA:  Focal neuro deficit greater than 6 hours. EXAM: CT HEAD WITHOUT CONTRAST TECHNIQUE: Contiguous axial images were obtained from the base of the skull through the vertex without intravenous contrast. COMPARISON:  CT head 05/25/2008 FINDINGS: Brain: Generalized atrophy. Extensive white matter hypodensity bilaterally, chronic. Negative for acute infarct, hemorrhage, mass. Vascular: Negative for hyperdense vessel Skull: Negative Sinuses/Orbits: Paranasal sinuses clear. Right mastoidectomy. Left mastoid clear. Bilateral cataract extraction. Other: None IMPRESSION: Atrophy and chronic white matter changes.  No acute abnormality. Electronically Signed   By: Franchot Gallo M.D.   On: 11/22/2020 17:40   DG Chest Portable 1 View  Result Date: 11/22/2020 CLINICAL DATA:  Weakness. EXAM: PORTABLE CHEST 1 VIEW COMPARISON:  05/25/2008 FINDINGS: The heart size is mildly enlarged. Aortic calcifications are noted. There is no pneumothorax. No large pleural effusion. No acute osseous abnormality. IMPRESSION: 1. No acute cardiopulmonary disease. 2. Mild cardiomegaly. 3. Aortic calcifications. Electronically Signed   By: Constance Holster M.D.   On: 11/22/2020 16:52   ECHOCARDIOGRAM COMPLETE  Result Date: 11/23/2020    ECHOCARDIOGRAM REPORT   Patient Name:    Rebecca Pruitt Date of Exam: 11/23/2020 Medical Rec #:  [id number]         Height:       64.0 in Accession #:    [id number]        Weight:       132.0 lb Date of Birth:  12-28-1928         BSA:          1.640 m Patient Age:    87 years          BP:           142/66 mmHg Patient Gender: F                 HR:           50 bpm. Exam Location:  Inpatient Procedure: 2D Echo, 3D Echo, Cardiac Doppler and Color Doppler Indications:    I35.0 Nonrheumatic aortic (valve) stenosis  History:        Patient has prior history of Echocardiogram examinations, most                 recent 05/26/2008. CAD, Aortic Valve Disease and Mitral Valve                 Disease, Signs/Symptoms:Altered Mental Status and Murmur; Risk                 Factors:Hypertension and Diabetes. Cancer. Dementia.  Sonographer:    Roseanna Rainbow RDCS Referring Phys: [place]  Sonographer Comments: Patient could not follow directions. IMPRESSIONS  1. Left ventricular ejection fraction, by estimation, is 65 to 70%. The left ventricle has normal function. The left ventricle has no regional wall motion abnormalities. There is severe left ventricular hypertrophy. Left ventricular diastolic parameters  are consistent with Grade II diastolic dysfunction (pseudonormalization).  2. Right ventricular systolic function is normal. The right ventricular size is normal. There is normal pulmonary artery systolic pressure.  3. Left atrial size was severely dilated.  4. Pericardial effusion - 1.1 cm. Moderate pericardial effusion. The  pericardial effusion is lateral to the left ventricle. There is no evidence of cardiac tamponade.  5. The mitral valve is normal in structure. Moderate mitral valve regurgitation. No evidence of mitral stenosis. The mean mitral valve gradient is 4.0 mmHg. Severe mitral annular calcification.  6. The aortic valve is tricuspid. Aortic valve regurgitation is not visualized. Severe aortic valve stenosis. Aortic valve area, by VTI measures  0.84 cm. Aortic valve mean gradient measures 38.3 mmHg. Aortic valve Vmax measures 3.97 m/s.  7. The inferior vena cava is normal in size with greater than 50% respiratory variability, suggesting right atrial pressure of 3 mmHg. FINDINGS  Left Ventricle: Left ventricular ejection fraction, by estimation, is 65 to 70%. The left ventricle has normal function. The left ventricle has no regional wall motion abnormalities. The left ventricular internal cavity size was normal in size. There is  severe left ventricular hypertrophy. Left ventricular diastolic parameters are consistent with Grade II diastolic dysfunction (pseudonormalization). Right Ventricle: The right ventricular size is normal. No increase in right ventricular wall thickness. Right ventricular systolic function is normal. There is normal pulmonary artery systolic pressure. The tricuspid regurgitant velocity is 2.41 m/s, and  with an assumed right atrial pressure of 3 mmHg, the estimated right ventricular systolic pressure is [id number]. Left Atrium: Left atrial size was severely dilated. Right Atrium: Right atrial size was normal in size. Pericardium: Pericardial effusion - 1.1 cm. A moderately sized pericardial effusion is present. The pericardial effusion is lateral to the left ventricle. There is no evidence of cardiac tamponade. Mitral Valve: The mitral valve is normal in structure. There is moderate thickening of the mitral valve leaflet(s). There is moderate calcification of the mitral valve leaflet(s). Severe mitral annular calcification. Moderate mitral valve regurgitation. No evidence of mitral valve stenosis. MV peak gradient, 11.8 mmHg. The mean mitral valve gradient is 4.0 mmHg. Tricuspid Valve: The tricuspid valve is normal in structure. Tricuspid valve regurgitation is not demonstrated. No evidence of tricuspid stenosis. Aortic Valve: The aortic valve is tricuspid. Aortic valve regurgitation is not visualized. Severe aortic stenosis is  present. Aortic valve mean gradient measures 38.3 mmHg. Aortic valve peak gradient measures 63.1 mmHg. Aortic valve area, by VTI measures  0.84 cm. Pulmonic Valve: The pulmonic valve was normal in structure. Pulmonic valve regurgitation is trivial. No evidence of pulmonic stenosis. Aorta: The aortic root is normal in size and structure. Venous: The inferior vena cava is normal in size with greater than 50% respiratory variability, suggesting right atrial pressure of 3 mmHg. IAS/Shunts: No atrial level shunt detected by color flow Doppler.  LEFT VENTRICLE PLAX 2D LVIDd:         2.50 cm     Diastology LVIDs:         1.70 cm     LV e' medial:    3.00 cm/s LV PW:         2.20 cm     LV E/e' medial:  46.3 LV IVS:        1.90 cm     LV e' lateral:   3.50 cm/s LVOT diam:     2.00 cm     LV E/e' lateral: 39.7 LV SV:         94 LV SV Index:   57 LVOT Area:     3.14 cm  LV Volumes (MOD) LV vol d, MOD A2C: 50.7 ml LV vol d, MOD A4C: 58.5 ml LV vol s, MOD A2C: 19.4 ml LV vol s, MOD  A4C: 22.0 ml LV SV MOD A2C:     31.3 ml LV SV MOD A4C:     58.5 ml LV SV MOD BP:      38.6 ml RIGHT VENTRICLE            IVC RV S prime:     8.18 cm/s  IVC diam: 1.60 cm TAPSE (M-mode): 1.8 cm LEFT ATRIUM             Index       RIGHT ATRIUM          Index LA diam:        4.30 cm 2.62 cm/m  RA Area:     9.23 cm LA Vol (A2C):   72.6 ml 44.28 ml/m RA Volume:   15.40 ml 9.39 ml/m LA Vol (A4C):   78.5 ml 47.88 ml/m LA Biplane Vol: 74.5 ml 45.44 ml/m  AORTIC VALVE                    PULMONIC VALVE AV Area (Vmax):    1.21 cm     PR End Diast Vel: 2.23 msec AV Area (Vmean):   0.87 cm AV Area (VTI):     0.84 cm AV Vmax:           397.33 cm/s AV Vmean:          291.000 cm/s AV VTI:            1.117 m AV Peak Grad:      63.1 mmHg AV Mean Grad:      38.3 mmHg LVOT Vmax:         153.00 cm/s LVOT Vmean:        80.700 cm/s LVOT VTI:          0.299 m LVOT/AV VTI ratio: 0.27  AORTA Ao Root diam: 3.00 cm Ao Asc diam:  3.10 cm MITRAL VALVE                  TRICUSPID VALVE MV Area (PHT): 1.50 cm      TR Peak grad:   23.2 mmHg MV Area VTI:   1.35 cm      TR Vmax:        241.00 cm/s MV Peak grad:  11.8 mmHg MV Mean grad:  4.0 mmHg      SHUNTS MV Vmax:       1.72 m/s      Systemic VTI:  0.30 m MV Vmean:      91.5 cm/s     Systemic Diam: 2.00 cm MV Decel Time: 507 msec MR Peak grad:    134.6 mmHg MR Mean grad:    82.0 mmHg MR Vmax:         580.00 cm/s MR Vmean:        417.0 cm/s MR PISA:         4.02 cm MR PISA Eff ROA: 26 mm MR PISA Radius:  0.80 cm MV E velocity: 139.00 cm/s MV A velocity: 108.00 cm/s MV E/A ratio:  1.29 Candee Furbish MD Electronically signed by Candee Furbish MD Signature Date/Time: 11/23/2020/11:24:30 AM    Final      LOS: 0 days   Antonieta Pert, MD [facility]  11/23/2020, 12:08 PM

## 2020-11-23 NOTE — Consult Note (Signed)
Urology Consult   Physician requesting consult: Maren Beach  Reason for consult: Right hydronephrosis  History of Present Illness: [name] is a 87 y.o. white female who has been followed in the outpatient urology clinic with recurrent urinary tract infections.  She had recent CT scan which shows unexplained right-sided hydronephrosis down to the level of the right ureterovesical junction.  She had been scheduled for outpatient cystoscopy and retrograde pyelogram on 11/27/2020 but during preoperative valuation was sent to the emergency room for failure to thrive and weakness.  She was admitted for IV antibiotics by the hospital service and I was asked for consultation regarding management.  Patient has had prior urine cultures most recently on 11/19/2020 in our office with cath specimens that showed significant WBCs but no growth on bacterial cultures.  She was on Augmentin prior to admission.  Since admission daughter states that she has perked up quite a bit and now is answering questions appropriately and seems much more alert.  Patient has not complaining of any flank pain.  She denies a history of voiding or storage urinary symptoms, hematuria, UTIs, STDs, urolithiasis, GU malignancy/trauma/surgery.  Past Medical History:  Diagnosis Date  . Cancer ([place])    skin  . Coronary artery disease   . Diabetes mellitus   . Diabetic retinopathy   . Dyslipidemia   . H/O: CVA (cardiovascular accident)   . Hearing loss   . Heart murmur   . History of kidney stones   . Hypertension   . Stroke [facility]) 03/2020   x5    Past Surgical History:  Procedure Laterality Date  . CESAREAN SECTION    . EYE SURGERY Bilateral   . hysterectomy - unknown type    . MASTOIDECTOMY     as a child     Current Hospital Medications:  Home meds:  No current facility-administered medications on file prior to encounter.   Current Outpatient Medications on File Prior to Encounter  Medication Sig Dispense  Refill  . amoxicillin-clavulanate (AUGMENTIN) 500-125 MG tablet Take 1 tablet by mouth 2 (two) times daily. Start date : 11/19/20    . aspirin 81 MG tablet Take 81 mg by mouth at bedtime.    . citalopram (CELEXA) 20 MG tablet Take 20 mg by mouth at bedtime.    . clopidogrel (PLAVIX) 75 MG tablet Take 75 mg by mouth daily.    Marland Kitchen CRANBERRY PO Take 4,200 mg by mouth 2 (two) times daily.    Marland Kitchen donepezil (ARICEPT) 10 MG tablet Take 10 mg by mouth at bedtime.    . fenofibrate 160 MG tablet Take 40 mg by mouth at bedtime.    . furosemide (LASIX) 20 MG tablet Take 10 mg by mouth daily.    . insulin NPH-insulin regular (NOVOLIN 70/30) (70-30) 100 UNIT/ML injection Inject 1-7 Units into the skin 2 (two) times daily as needed (high blood sugar). If blood sugar is over 150 takes 1 unit ; sliding scale    . mupirocin ointment (BACTROBAN) 2 % Apply 1 application topically 2 (two) times daily as needed for wound care.       Scheduled Meds: . citalopram  20 mg Oral QHS  . insulin aspart  0-9 Units Subcutaneous Q4H  . potassium & sodium phosphates  1 packet Oral TID WC & HS   Continuous Infusions: . sodium chloride 100 mL/hr (11/23/20 1227)  . cefTRIAXone (ROCEPHIN)  IV     PRN Meds:.acetaminophen **OR** acetaminophen  Allergies:  Allergies  Allergen Reactions  .  Codeine     Syncope     Family History  Problem Relation Age of Onset  . Prostate cancer Other     Social History:  reports that she has never smoked. She has never used smokeless tobacco. She reports that she does not drink alcohol and does not use drugs.  ROS: A complete review of systems was performed.  All systems are negative except for pertinent findings as noted.  Physical Exam:  Vital signs in last 24 hours: Temp:  [97.7 F (36.5 C)-98 F (36.7 C)] 97.7 F (36.5 C) (02/04 1627) Pulse Rate:  [50-71] 67 (02/04 1627) Resp:  [11-20] 16 (02/04 1627) BP: (135-164)/(55-99) 157/66 (02/04 1627) SpO2:  [98 %-100 %] 98 % (02/04  1627) Weight:  [48.4 kg] 48.4 kg (02/04 1627) Constitutional: Patient is alert Cardiovascular: Regular rate and rhythm, No JVD Respiratory: Normal respiratory effort, Lungs clear bilaterally GI: Abdomen is soft, nontender, nondistended, no abdominal masses GU: No CVA tenderness Lymphatic: No lymphadenopathy Neurologic: Patient with prior CVA has lower extremity weakness   Laboratory Data:  Recent Labs    11/22/20 1418 11/22/20 1524 11/22/20 1542 11/23/20 0643  WBC 7.0 7.2  --  6.5  HGB 11.5* 11.7* 11.9* 10.4*  HCT 36.5 36.9 35.0* 32.5*  PLT 341 332  --  303    Recent Labs    11/22/20 1418 11/22/20 1524 11/22/20 1542 11/23/20 0643  NA 137 138 137 139  K 3.6 3.9 3.9 3.3*  CL 101 101 101 102  GLUCOSE 140* 154* 148* 122*  BUN 16 17 17 13   CALCIUM 10.2 10.5*  --  9.6  CREATININE 0.73 0.82 0.70 0.63     Results for orders placed or performed during the hospital encounter of 11/22/20 (from the past 24 hour(s))  CBG monitoring, ED     Status: Abnormal   Collection Time: 11/23/20 12:19 AM  Result Value Ref Range   Glucose-Capillary 129 (H) 70 - 99 mg/dL   Comment 1 Notify RN   CK     Status: Abnormal   Collection Time: 11/23/20  6:43 AM  Result Value Ref Range   Total CK 20 (L) 38 - 234 U/L  Magnesium     Status: None   Collection Time: 11/23/20  6:43 AM  Result Value Ref Range   Magnesium 1.9 1.7 - 2.4 mg/dL  Phosphorus     Status: Abnormal   Collection Time: 11/23/20  6:43 AM  Result Value Ref Range   Phosphorus 2.3 (L) 2.5 - 4.6 mg/dL  Hemoglobin A1c     Status: Abnormal   Collection Time: 11/23/20  6:43 AM  Result Value Ref Range   Hgb A1c MFr Bld 7.5 (H) 4.8 - 5.6 %   Mean Plasma Glucose 168.55 mg/dL  Magnesium     Status: None   Collection Time: 11/23/20  6:43 AM  Result Value Ref Range   Magnesium 1.9 1.7 - 2.4 mg/dL  Phosphorus     Status: Abnormal   Collection Time: 11/23/20  6:43 AM  Result Value Ref Range   Phosphorus 2.3 (L) 2.5 - 4.6 mg/dL   CBC WITH DIFFERENTIAL     Status: Abnormal   Collection Time: 11/23/20  6:43 AM  Result Value Ref Range   WBC 6.5 4.0 - 10.5 K/uL   RBC 3.56 (L) 3.87 - 5.11 MIL/uL   Hemoglobin 10.4 (L) 12.0 - 15.0 g/dL   HCT 32.5 (L) 36.0 - 46.0 %   MCV 91.3 80.0 - 100.0  fL   MCH 29.2 26.0 - 34.0 pg   MCHC 32.0 30.0 - 36.0 g/dL   RDW 13.8 11.5 - 15.5 %   Platelets 303 150 - 400 K/uL   nRBC 0.0 0.0 - 0.2 %   Neutrophils Relative % 57 %   Neutro Abs 3.7 1.7 - 7.7 K/uL   Lymphocytes Relative 28 %   Lymphs Abs 1.9 0.7 - 4.0 K/uL   Monocytes Relative 11 %   Monocytes Absolute 0.7 0.1 - 1.0 K/uL   Eosinophils Relative 4 %   Eosinophils Absolute 0.2 0.0 - 0.5 K/uL   Basophils Relative 0 %   Basophils Absolute 0.0 0.0 - 0.1 K/uL   Immature Granulocytes 0 %   Abs Immature Granulocytes 0.02 0.00 - 0.07 K/uL  TSH     Status: None   Collection Time: 11/23/20  6:43 AM  Result Value Ref Range   TSH 1.392 0.350 - 4.500 uIU/mL  Comprehensive metabolic panel     Status: Abnormal   Collection Time: 11/23/20  6:43 AM  Result Value Ref Range   Sodium 139 135 - 145 mmol/L   Potassium 3.3 (L) 3.5 - 5.1 mmol/L   Chloride 102 98 - 111 mmol/L   CO2 26 22 - 32 mmol/L   Glucose, Bld 122 (H) 70 - 99 mg/dL   BUN 13 8 - 23 mg/dL   Creatinine, Ser 0.63 0.44 - 1.00 mg/dL   Calcium 9.6 8.9 - 10.3 mg/dL   Total Protein 6.1 (L) 6.5 - 8.1 g/dL   Albumin 2.5 (L) 3.5 - 5.0 g/dL   AST 9 (L) 15 - 41 U/L   ALT 8 0 - 44 U/L   Alkaline Phosphatase 37 (L) 38 - 126 U/L   Total Bilirubin 0.4 0.3 - 1.2 mg/dL   GFR, Estimated >60 >60 mL/min   Anion gap 11 5 - 15  CBG monitoring, ED     Status: Abnormal   Collection Time: 11/23/20  7:36 AM  Result Value Ref Range   Glucose-Capillary 103 (H) 70 - 99 mg/dL  CBG monitoring, ED     Status: None   Collection Time: 11/23/20 11:19 AM  Result Value Ref Range   Glucose-Capillary 95 70 - 99 mg/dL   Comment 1 Notify RN   CBG monitoring, ED     Status: Abnormal   Collection Time:  11/23/20  3:42 PM  Result Value Ref Range   Glucose-Capillary 171 (H) 70 - 99 mg/dL   Recent Results (from the past 240 hour(s))  SARS Coronavirus 2 by RT PCR (hospital order, performed in [facility] hospital lab) Nasopharyngeal Nasopharyngeal Swab     Status: None   Collection Time: 11/22/20  4:21 PM   Specimen: Nasopharyngeal Swab  Result Value Ref Range Status   SARS Coronavirus 2 NEGATIVE NEGATIVE Final    Comment: (NOTE) SARS-CoV-2 target nucleic acids are NOT DETECTED.  The SARS-CoV-2 RNA is generally detectable in upper and lower respiratory specimens during the acute phase of infection. The lowest concentration of SARS-CoV-2 viral copies this assay can detect is 250 copies / mL. A negative result does not preclude SARS-CoV-2 infection and should not be used as the sole basis for treatment or other patient management decisions.  A negative result may occur with improper specimen collection / handling, submission of specimen other than nasopharyngeal swab, presence of viral mutation(s) within the areas targeted by this assay, and inadequate number of viral copies (<250 copies / mL). A negative result must  be combined with clinical observations, patient history, and epidemiological information.  Fact Sheet for Patients:   StrictlyIdeas.no  Fact Sheet for Healthcare Providers: BankingDealers.co.za  This test is not yet approved or  cleared by the [place] FDA and has been authorized for detection and/or diagnosis of SARS-CoV-2 by FDA under an Emergency Use Authorization (EUA).  This EUA will remain in effect (meaning this test can be used) for the duration of the COVID-19 declaration under Section 564(b)(1) of the Act, 21 U.S.C. section 360bbb-3(b)(1), unless the authorization is terminated or revoked sooner.  Performed at [facility], [place] [address] [place], [place] [address]     Renal  Function: Recent Labs    11/22/20 1418 11/22/20 1524 11/22/20 1542 11/23/20 0643  CREATININE 0.73 0.82 0.70 0.63   Estimated Creatinine Clearance: 36.4 mL/min (by C-G formula based on SCr of 0.63 mg/dL).  Radiologic Imaging: CT HEAD WO CONTRAST  Result Date: 11/22/2020 CLINICAL DATA:  Focal neuro deficit greater than 6 hours. EXAM: CT HEAD WITHOUT CONTRAST TECHNIQUE: Contiguous axial images were obtained from the base of the skull through the vertex without intravenous contrast. COMPARISON:  CT head 05/25/2008 FINDINGS: Brain: Generalized atrophy. Extensive white matter hypodensity bilaterally, chronic. Negative for acute infarct, hemorrhage, mass. Vascular: Negative for hyperdense vessel Skull: Negative Sinuses/Orbits: Paranasal sinuses clear. Right mastoidectomy. Left mastoid clear. Bilateral cataract extraction. Other: None IMPRESSION: Atrophy and chronic white matter changes.  No acute abnormality. Electronically Signed   By: Franchot Gallo M.D.   On: 11/22/2020 17:40   DG Chest Portable 1 View  Result Date: 11/22/2020 CLINICAL DATA:  Weakness. EXAM: PORTABLE CHEST 1 VIEW COMPARISON:  05/25/2008 FINDINGS: The heart size is mildly enlarged. Aortic calcifications are noted. There is no pneumothorax. No large pleural effusion. No acute osseous abnormality. IMPRESSION: 1. No acute cardiopulmonary disease. 2. Mild cardiomegaly. 3. Aortic calcifications. Electronically Signed   By: Constance Holster M.D.   On: 11/22/2020 16:52   ECHOCARDIOGRAM COMPLETE  Result Date: 11/23/2020    ECHOCARDIOGRAM REPORT   Patient Name:   Rebecca Pruitt Date of Exam: 11/23/2020 Medical Rec #:  [id number]         Height:       64.0 in Accession #:    [id number]        Weight:       132.0 lb Date of Birth:  Mar 07, 1929         BSA:          1.640 m Patient Age:    38 years          BP:           142/66 mmHg Patient Gender: F                 HR:           50 bpm. Exam Location:  Inpatient Procedure: 2D Echo, 3D Echo,  Cardiac Doppler and Color Doppler Indications:    I35.0 Nonrheumatic aortic (valve) stenosis  History:        Patient has prior history of Echocardiogram examinations, most                 recent 05/26/2008. CAD, Aortic Valve Disease and Mitral Valve                 Disease, Signs/Symptoms:Altered Mental Status and Murmur; Risk                 Factors:Hypertension and Diabetes. Cancer. Dementia.  Sonographer:    Roseanna Rainbow RDCS Referring Phys: [place]  Sonographer Comments: Patient could not follow directions. IMPRESSIONS  1. Left ventricular ejection fraction, by estimation, is 65 to 70%. The left ventricle has normal function. The left ventricle has no regional wall motion abnormalities. There is severe left ventricular hypertrophy. Left ventricular diastolic parameters  are consistent with Grade II diastolic dysfunction (pseudonormalization).  2. Right ventricular systolic function is normal. The right ventricular size is normal. There is normal pulmonary artery systolic pressure.  3. Left atrial size was severely dilated.  4. Pericardial effusion - 1.1 cm. Moderate pericardial effusion. The pericardial effusion is lateral to the left ventricle. There is no evidence of cardiac tamponade.  5. The mitral valve is normal in structure. Moderate mitral valve regurgitation. No evidence of mitral stenosis. The mean mitral valve gradient is 4.0 mmHg. Severe mitral annular calcification.  6. The aortic valve is tricuspid. Aortic valve regurgitation is not visualized. Severe aortic valve stenosis. Aortic valve area, by VTI measures 0.84 cm. Aortic valve mean gradient measures 38.3 mmHg. Aortic valve Vmax measures 3.97 m/s.  7. The inferior vena cava is normal in size with greater than 50% respiratory variability, suggesting right atrial pressure of 3 mmHg. FINDINGS  Left Ventricle: Left ventricular ejection fraction, by estimation, is 65 to 70%. The left ventricle has normal function. The left ventricle has  no regional wall motion abnormalities. The left ventricular internal cavity size was normal in size. There is  severe left ventricular hypertrophy. Left ventricular diastolic parameters are consistent with Grade II diastolic dysfunction (pseudonormalization). Right Ventricle: The right ventricular size is normal. No increase in right ventricular wall thickness. Right ventricular systolic function is normal. There is normal pulmonary artery systolic pressure. The tricuspid regurgitant velocity is 2.41 m/s, and  with an assumed right atrial pressure of 3 mmHg, the estimated right ventricular systolic pressure is [id number]. Left Atrium: Left atrial size was severely dilated. Right Atrium: Right atrial size was normal in size. Pericardium: Pericardial effusion - 1.1 cm. A moderately sized pericardial effusion is present. The pericardial effusion is lateral to the left ventricle. There is no evidence of cardiac tamponade. Mitral Valve: The mitral valve is normal in structure. There is moderate thickening of the mitral valve leaflet(s). There is moderate calcification of the mitral valve leaflet(s). Severe mitral annular calcification. Moderate mitral valve regurgitation. No evidence of mitral valve stenosis. MV peak gradient, 11.8 mmHg. The mean mitral valve gradient is 4.0 mmHg. Tricuspid Valve: The tricuspid valve is normal in structure. Tricuspid valve regurgitation is not demonstrated. No evidence of tricuspid stenosis. Aortic Valve: The aortic valve is tricuspid. Aortic valve regurgitation is not visualized. Severe aortic stenosis is present. Aortic valve mean gradient measures 38.3 mmHg. Aortic valve peak gradient measures 63.1 mmHg. Aortic valve area, by VTI measures  0.84 cm. Pulmonic Valve: The pulmonic valve was normal in structure. Pulmonic valve regurgitation is trivial. No evidence of pulmonic stenosis. Aorta: The aortic root is normal in size and structure. Venous: The inferior vena cava is normal in size  with greater than 50% respiratory variability, suggesting right atrial pressure of 3 mmHg. IAS/Shunts: No atrial level shunt detected by color flow Doppler.  LEFT VENTRICLE PLAX 2D LVIDd:         2.50 cm     Diastology LVIDs:         1.70 cm     LV e' medial:    3.00 cm/s LV PW:  2.20 cm     LV E/e' medial:  46.3 LV IVS:        1.90 cm     LV e' lateral:   3.50 cm/s LVOT diam:     2.00 cm     LV E/e' lateral: 39.7 LV SV:         94 LV SV Index:   57 LVOT Area:     3.14 cm  LV Volumes (MOD) LV vol d, MOD A2C: 50.7 ml LV vol d, MOD A4C: 58.5 ml LV vol s, MOD A2C: 19.4 ml LV vol s, MOD A4C: 22.0 ml LV SV MOD A2C:     31.3 ml LV SV MOD A4C:     58.5 ml LV SV MOD BP:      38.6 ml RIGHT VENTRICLE            IVC RV S prime:     8.18 cm/s  IVC diam: 1.60 cm TAPSE (M-mode): 1.8 cm LEFT ATRIUM             Index       RIGHT ATRIUM          Index LA diam:        4.30 cm 2.62 cm/m  RA Area:     9.23 cm LA Vol (A2C):   72.6 ml 44.28 ml/m RA Volume:   15.40 ml 9.39 ml/m LA Vol (A4C):   78.5 ml 47.88 ml/m LA Biplane Vol: 74.5 ml 45.44 ml/m  AORTIC VALVE                    PULMONIC VALVE AV Area (Vmax):    1.21 cm     PR End Diast Vel: 2.23 msec AV Area (Vmean):   0.87 cm AV Area (VTI):     0.84 cm AV Vmax:           397.33 cm/s AV Vmean:          291.000 cm/s AV VTI:            1.117 m AV Peak Grad:      63.1 mmHg AV Mean Grad:      38.3 mmHg LVOT Vmax:         153.00 cm/s LVOT Vmean:        80.700 cm/s LVOT VTI:          0.299 m LVOT/AV VTI ratio: 0.27  AORTA Ao Root diam: 3.00 cm Ao Asc diam:  3.10 cm MITRAL VALVE                 TRICUSPID VALVE MV Area (PHT): 1.50 cm      TR Peak grad:   23.2 mmHg MV Area VTI:   1.35 cm      TR Vmax:        241.00 cm/s MV Peak grad:  11.8 mmHg MV Mean grad:  4.0 mmHg      SHUNTS MV Vmax:       1.72 m/s      Systemic VTI:  0.30 m MV Vmean:      91.5 cm/s     Systemic Diam: 2.00 cm MV Decel Time: 507 msec MR Peak grad:    134.6 mmHg MR Mean grad:    82.0 mmHg MR Vmax:          580.00 cm/s MR Vmean:        417.0 cm/s MR PISA:  4.02 cm MR PISA Eff ROA: 26 mm MR PISA Radius:  0.80 cm MV E velocity: 139.00 cm/s MV A velocity: 108.00 cm/s MV E/A ratio:  1.29 Candee Furbish MD Electronically signed by Candee Furbish MD Signature Date/Time: 11/23/2020/11:24:30 AM    Final     I independently reviewed the above imaging studies.  Impression/Recommendation: Right hydronephrosis with recent admission for failure to thrive and weakness. Recommend follow-up on urine culture, may consider cystoscopy and retrograde during this hospitalization if she fails to improve.  If she is showing significant improvement with IV fluids and cultures are negative may keep current date for cystoscopy and retrograde as outpatient.  Discussed situation with daughter and patient in detail.  Remi Haggard 11/23/2020, 5:38 PM     CC:

## 2020-11-23 NOTE — Evaluation (Signed)
Physical Therapy Evaluation Patient Details Name: Rebecca Pruitt MRN: [id number] DOB: [birth year] Today's Date: 11/23/2020   History of Present Illness  87 y.o. female with medical history significant of CVA  CAD, DM, HLD, HTN presenting with progressive generalized weakness usually able to ambulate with walker however having increased difficulty.  Clinical Impression  Pt admitted as above and presenting with functional mobility limitations 2* generalized weakness, significant balance deficits, limited endurance and ongoing cognitive deficits.  Pt currently requires significant assist of 2 for performance of all basic mobility tasks and would benefit from follow up rehab at SNF level to maximize IND and safety - dependent on acute stay progress and level of assist family can provide.    Follow Up Recommendations SNF    Equipment Recommendations  None recommended by PT    Recommendations for Other Services       Precautions / Restrictions Precautions Precautions: Fall Restrictions Weight Bearing Restrictions: No      Mobility  Bed Mobility Overal bed mobility: Needs Assistance Bed Mobility: Rolling;Sidelying to Sit;Sit to Supine Rolling: Total assist Sidelying to sit: Total assist;+2 for safety/equipment;+2 for physical assistance;HOB elevated   Sit to supine: Total assist;+2 for physical assistance;+2 for safety/equipment   General bed mobility comments: patient does not initiate much mobility especially with trunk, initial posterior lean needing fluctuating support    Transfers Overall transfer level: Needs assistance Equipment used: 2 person hand held assist Transfers: Sit to/from Stand Sit to Stand: Max assist;+2 physical assistance         General transfer comment: max x2 to power up to standing from gurney onto foot stool with strong posterior lean  Ambulation/Gait             General Gait Details: Pt stood only  Network engineer Rankin (Stroke Patients Only)       Balance Overall balance assessment: Needs assistance Sitting-balance support: Feet supported Sitting balance-Leahy Scale: Poor Sitting balance - Comments: requiring intermittent assist for sitting balance due to posterior lean Postural control: Posterior lean Standing balance support: Bilateral upper extremity supported Standing balance-Leahy Scale: Zero Standing balance comment: max A x2 to stand                             Pertinent Vitals/Pain Pain Assessment: Faces Faces Pain Scale: Hurts little more Pain Location: hands and LEs with stretching Pain Descriptors / Indicators: Grimacing Pain Intervention(s): Limited activity within patient's tolerance;Monitored during session    Home Living Family/patient expects to be discharged to:: Private residence Living Arrangements: Children;Other (Comment)               Additional Comments: patient is pleasantly confused, unable to provide info    Prior Function Level of Independence: Needs assistance   Gait / Transfers Assistance Needed: pt reports her DTR walks with her while she uses her rollator "most of the time"     Comments: patient unable to provide history, pleasantly confused. Per chart DTR is patient's caregiver     Hand Dominance   Dominant Hand: Left    Extremity/Trunk Assessment   Upper Extremity Assessment Upper Extremity Assessment: RUE deficits/detail;LUE deficits/detail RUE Deficits / Details: patient keeps B UEs in flexed posture, with gentle ROM patient able to bring R hand next to her at EOB. generalized weakness LUE Deficits / Details: L UE more contracted than R,  able to extend L elbow to ~50% ROM, with gentle PROM able to extend digits to open palm, could benefit from palm protectors. generalized weakness    Lower Extremity Assessment Lower Extremity Assessment: RLE deficits/detail;LLE deficits/detail;Generalized  weakness;Difficult to assess due to impaired cognition RLE Deficits / Details: AAROM 0-90 at hip and knee but full assessment ltd by cognition LLE Deficits / Details: AAROM to 0-90 at hip and knee but full assessment ltd by cognitive deficits    Cervical / Trunk Assessment Cervical / Trunk Assessment: Kyphotic  Communication   Communication: HOH  Cognition Arousal/Alertness: Awake/alert Behavior During Therapy: Flat affect Overall Cognitive Status: No family/caregiver present to determine baseline cognitive functioning Area of Impairment: Orientation;Memory;Following commands;Problem solving                 Orientation Level: Disoriented to;Place;Time;Situation   Memory: Decreased short-term memory Following Commands: Follows one step commands with increased time     Problem Solving: Slow processing;Decreased initiation General Comments: patient is very sweet and pleasantly confused, asked her how many children she has states "I'm not sure." needs increased time to answer questions and sometimes not answering      General Comments General comments (skin integrity, edema, etc.): VSS on Ra    Exercises     Assessment/Plan    PT Assessment Patient needs continued PT services  PT Problem List Decreased strength;Decreased activity tolerance;Decreased range of motion;Decreased balance;Decreased mobility;Decreased cognition;Decreased knowledge of use of DME       PT Treatment Interventions DME instruction;Gait training;Functional mobility training;Therapeutic activities;Therapeutic exercise;Cognitive remediation;Balance training    PT Goals (Current goals can be found in the Care Plan section)  Acute Rehab PT Goals Patient Stated Goal: eat PT Goal Formulation: Patient unable to participate in goal setting Time For Goal Achievement: 12/07/20 Potential to Achieve Goals: Fair    Frequency Min 3X/week   Barriers to discharge        Co-evaluation PT/OT/SLP  Co-Evaluation/Treatment: Yes Reason for Co-Treatment: For patient/therapist safety PT goals addressed during session: Mobility/safety with mobility OT goals addressed during session: ADL's and self-care       AM-PAC PT "6 Clicks" Mobility  Outcome Measure Help needed turning from your back to your side while in a flat bed without using bedrails?: Total Help needed moving from lying on your back to sitting on the side of a flat bed without using bedrails?: Total Help needed moving to and from a bed to a chair (including a wheelchair)?: Total Help needed standing up from a chair using your arms (e.g., wheelchair or bedside chair)?: A Lot Help needed to walk in hospital room?: Total Help needed climbing 3-5 steps with a railing? : Total 6 Click Score: 7    End of Session Equipment Utilized During Treatment: Gait belt Activity Tolerance: Patient limited by fatigue Patient left: in bed;with call bell/phone within reach Nurse Communication: Mobility status PT Visit Diagnosis: Unsteadiness on feet (R26.81);Muscle weakness (generalized) (M62.81);Difficulty in walking, not elsewhere classified (R26.2)    Time: 9563-8756 PT Time Calculation (min) (ACUTE ONLY): 29 min   Charges:   PT Evaluation $PT Eval Moderate Complexity: 1 Mod          Debe Coder PT Acute Rehabilitation Services Pager [phone] Office [phone]   Andrus Sharp 11/23/2020, 12:03 PM

## 2020-11-23 NOTE — Progress Notes (Signed)
  Echocardiogram 2D Echocardiogram has been performed.  Rebecca Pruitt 11/23/2020, 8:49 AM

## 2020-11-23 NOTE — Progress Notes (Signed)
Inpatient Diabetes Program Recommendations  AACE/ADA: New Consensus Statement on Inpatient Glycemic Control (2015)  Target Ranges:  Prepandial:   less than 140 mg/dL      Peak postprandial:   less than 180 mg/dL (1-2 hours)      Critically ill patients:  140 - 180 mg/dL   Lab Results  Component Value Date   GLUCAP 171 (H) 11/23/2020   HGBA1C 7.5 (H) 11/23/2020    Review of Glycemic Control  Diabetes history: DM2 Outpatient Diabetes medications: Novolin 70/30 1-7 units BID with meals Current orders for Inpatient glycemic control: Novolog 0-9 units Q4H  HgbA1C - 7.5%  Inpatient Diabetes Program Recommendations:     Agree with orders. Blood sugars continue within goal.  Watch.  Thank you. Lorenda Peck, RD, LDN, CDE Inpatient Diabetes Coordinator [phone]

## 2020-11-23 NOTE — Evaluation (Signed)
Occupational Therapy Evaluation Patient Details Name: Rebecca Pruitt MRN: [id number] DOB: 16-Feb-1929 Today's Date: 11/23/2020    History of Present Illness 87 y.o. female with medical history significant of CVA  CAD, DM, HLD, HTN presenting with progressive generalized weakness usually able to ambulate with walker however having increased difficulty.   Clinical Impression   Patient is very sweet and pleasantly confused therefore unable to provide much history. Does confirm she lives with her DTR and that she walks with her "most of the time" when she uses her rollator. Unsure of level of assist for ADLs. Currently patient presents with weakness, poor balance, activity tolerance and hx of CVA keeping B UE in flexed position. Able to extend digits of B hands, may benefit from palm protectors? Pt requiring total A x2 for bed mobility with very minimal initiation of mobility and max A x2 to power up to standing with strong posterior lean. Recommend continued acute OT services to maximize patient endurance, strength, balance in order to facilitate D/C to venue listed below/reduce caregiver burden.    Follow Up Recommendations  Supervision/Assistance - 24 hour;SNF;Other (comment) (unless family can provide current level of assist)    Equipment Recommendations  Other (comment);Wheelchair (measurements OT) (unsure of home DME)       Precautions / Restrictions Precautions Precautions: Fall Restrictions Weight Bearing Restrictions: No      Mobility Bed Mobility Overal bed mobility: Needs Assistance Bed Mobility: Rolling;Sidelying to Sit;Sit to Supine Rolling: Total assist Sidelying to sit: Total assist;+2 for safety/equipment;+2 for physical assistance;HOB elevated   Sit to supine: Total assist;+2 for physical assistance;+2 for safety/equipment   General bed mobility comments: patient does not initiate much mobility especially with trunk, initial posterior lean needing fluctuating support     Transfers Overall transfer level: Needs assistance Equipment used: 2 person hand held assist Transfers: Sit to/from Stand Sit to Stand: Max assist;+2 physical assistance         General transfer comment: max x2 to power up to standing from gurney onto foot stool with strong posterior lean    Balance Overall balance assessment: Needs assistance Sitting-balance support: Feet supported Sitting balance-Leahy Scale: Poor Sitting balance - Comments: requiring intermittent assist for sitting balance due to posterior lean Postural control: Posterior lean Standing balance support: Bilateral upper extremity supported Standing balance-Leahy Scale: Zero Standing balance comment: max A x2 to stand                           ADL either performed or assessed with clinical judgement   ADL Overall ADL's : Needs assistance/impaired Eating/Feeding: Total assistance Eating/Feeding Details (indicate cue type and reason): patient taking sips from water cup Grooming: Total assistance;Bed level;Sitting   Upper Body Bathing: Total assistance;Bed level   Lower Body Bathing: Total assistance;Bed level   Upper Body Dressing : Total assistance;Bed level   Lower Body Dressing: Total assistance;Bed level   Toilet Transfer: Maximal assistance;+2 for physical assistance;+2 for safety/equipment Toilet Transfer Details (indicate cue type and reason): pt needing max x2 to power up to standing from gurney and foot stool. strong posterior lean Toileting- Clothing Manipulation and Hygiene: Total assistance       Functional mobility during ADLs: Maximal assistance;+2 for physical assistance;+2 for safety/equipment General ADL Comments: unsure of level of assist at baseline however patient needing max to total A x1-2 for all self care due to weakness, poor balance, activity tolerance, safety and cognition     Vision Patient Visual  Report: Other (comment) (difficult to assess 2* cognition however  appears to have visual deficits) Additional Comments: when held up 2 fingers from patient's face ~41ft away pt states "I don't know what to say"            Pertinent Vitals/Pain Pain Assessment: Faces Faces Pain Scale: Hurts little more Pain Location: hands and LEs with stretching Pain Descriptors / Indicators: Grimacing Pain Intervention(s): Monitored during session     Hand Dominance Left   Extremity/Trunk Assessment Upper Extremity Assessment Upper Extremity Assessment: RUE deficits/detail;LUE deficits/detail RUE Deficits / Details: patient keeps B UEs in flexed posture, with gentle ROM patient able to bring R hand next to him at EOB. generalized weakness LUE Deficits / Details: L UE more contracted than R, able to extend L elbow to ~50% ROM, with gentle PROM able to extend digits to open palm, could benefit from palm protectors. generalized weakness   Lower Extremity Assessment Lower Extremity Assessment: Defer to PT evaluation   Cervical / Trunk Assessment Cervical / Trunk Assessment: Kyphotic   Communication Communication Communication: HOH   Cognition Arousal/Alertness: Awake/alert Behavior During Therapy: Flat affect Overall Cognitive Status: No family/caregiver present to determine baseline cognitive functioning Area of Impairment: Orientation;Memory;Following commands;Problem solving                 Orientation Level: Disoriented to;Place;Time;Situation   Memory: Decreased short-term memory (Decreased long term memory) Following Commands: Follows one step commands with increased time     Problem Solving: Slow processing;Decreased initiation General Comments: patient is very sweet and pleasantly confused, asked her how many children she has states "I'm not sure." needs increased time to answer questions and sometimes not answering   General Comments  VSS on Ra            Home Living Family/patient expects to be discharged to:: Private  residence Living Arrangements: Children;Other (Comment) (DTR)                               Additional Comments: patient is pleasantly confused, unable to provide info      Prior Functioning/Environment Level of Independence: Needs assistance  Gait / Transfers Assistance Needed: pt reports her DTR walks with her while she uses her rollator "most of the time"     Comments: patient unable to provide history, pleasantly confused. Per chart DTR is patient's caregiver        OT Problem List: Decreased strength;Decreased activity tolerance;Impaired balance (sitting and/or standing);Decreased cognition;Decreased safety awareness      OT Treatment/Interventions: Self-care/ADL training;Therapeutic exercise;Therapeutic activities;Cognitive remediation/compensation;Patient/family education;Balance training    OT Goals(Current goals can be found in the care plan section) Acute Rehab OT Goals Patient Stated Goal: eat OT Goal Formulation: With patient Time For Goal Achievement: 12/07/20 Potential to Achieve Goals: Fair  OT Frequency: Min 2X/week    AM-PAC OT "6 Clicks" Daily Activity     Outcome Measure Help from another person eating meals?: Total Help from another person taking care of personal grooming?: Total Help from another person toileting, which includes using toliet, bedpan, or urinal?: Total Help from another person bathing (including washing, rinsing, drying)?: Total Help from another person to put on and taking off regular upper body clothing?: Total Help from another person to put on and taking off regular lower body clothing?: Total 6 Click Score: 6   End of Session Nurse Communication: Mobility status;Other (comment) (notified staff pt hungry/needs assist to order  breakfast)  Activity Tolerance: Patient tolerated treatment well Patient left: in bed;with call bell/phone within reach  OT Visit Diagnosis: Unsteadiness on feet (R26.81);Other abnormalities of  gait and mobility (R26.89);Muscle weakness (generalized) (M62.81);Other symptoms and signs involving cognitive function                Time: 3295-1884 OT Time Calculation (min): 27 min Charges:  OT General Charges $OT Visit: 1 Visit OT Evaluation $OT Eval Moderate Complexity: [place] OT OT pager: [place] 11/23/2020, 10:28 AM

## 2020-11-24 DIAGNOSIS — L8990 Pressure ulcer of unspecified site, unspecified stage: Secondary | ICD-10-CM | POA: Insufficient documentation

## 2020-11-24 LAB — GLUCOSE, CAPILLARY
Glucose-Capillary: 107 mg/dL — ABNORMAL HIGH (ref 70–99)
Glucose-Capillary: 116 mg/dL — ABNORMAL HIGH (ref 70–99)
Glucose-Capillary: 195 mg/dL — ABNORMAL HIGH (ref 70–99)
Glucose-Capillary: 216 mg/dL — ABNORMAL HIGH (ref 70–99)
Glucose-Capillary: 90 mg/dL (ref 70–99)
Glucose-Capillary: 92 mg/dL (ref 70–99)

## 2020-11-24 LAB — BASIC METABOLIC PANEL
Anion gap: 7 (ref 5–15)
BUN: 10 mg/dL (ref 8–23)
CO2: 25 mmol/L (ref 22–32)
Calcium: 9.1 mg/dL (ref 8.9–10.3)
Chloride: 108 mmol/L (ref 98–111)
Creatinine, Ser: 0.65 mg/dL (ref 0.44–1.00)
GFR, Estimated: >60 mL/min (ref >60)
Glucose, Bld: 101 mg/dL — ABNORMAL HIGH (ref 70–99)
Potassium: 3.3 mmol/L — ABNORMAL LOW (ref 3.5–5.1)
Sodium: 140 mmol/L (ref 135–145)

## 2020-11-24 LAB — CBC
HCT: 43.4 % (ref 36.0–46.0)
Hemoglobin: 14.2 g/dL (ref 12.0–15.0)
MCH: 29.4 pg (ref 26.0–34.0)
MCHC: 32.7 g/dL (ref 30.0–36.0)
MCV: 89.9 fL (ref 80.0–100.0)
Platelets: 223 10*3/uL (ref 150–400)
RBC: 4.83 MIL/uL (ref 3.87–5.11)
RDW: 13.7 % (ref 11.5–15.5)
WBC: 4.9 10*3/uL (ref 4.0–10.5)
nRBC: 0 % (ref 0.0–0.2)

## 2020-11-24 LAB — URINE CULTURE: Culture: NO GROWTH

## 2020-11-24 MED ORDER — ORAL CARE MOUTH RINSE
15.0000 mL | Freq: Two times a day (BID) | OROMUCOSAL | Status: DC
  Administered 2020-11-24 – 2020-11-30 (×13): 15 mL via OROMUCOSAL

## 2020-11-24 MED ORDER — LORAZEPAM 0.5 MG PO TABS
0.5000 mg | ORAL_TABLET | Freq: Every evening | ORAL | Status: DC | PRN
  Administered 2020-11-24: 0.5 mg via ORAL
  Filled 2020-11-24: qty 1

## 2020-11-24 NOTE — TOC Initial Note (Signed)
Transition of Care [facility]) - Initial/Assessment Note    Patient Details  Name: Rebecca Pruitt MRN: [id number] Date of Birth: [birth year]  Transition of [facility] CM/SW Contact:    Norina Buzzard, RN Phone Number: 11/24/2020, 1:45 PM  Clinical Narrative:            87 yo F with hix of CVA, CAD, diabetes, hyperlipidemia, hypertension, bilateral nephrolithiasis with mild R hydroureteronephrosis due to urothelial neoplasm or distal ureteral calculi based, chronic bladder outlet obstruction with large diverticulum in the bladder fundus. dmitted with progressive generalized weakness over the past few weeks. Pt was getting some preop testing and had hard time getting out of a car requiring rapid response to be called.  PT is recommending SNF. Contacted pt's daughter [name]) at [phone]. Discussed PT recommendations. She reports that she is not sure if she wants pt to go to a facility. She reports that last July pt went to a facility in [place] and her mother did not improve, but she declined. She is also concern that she won't be able to visit her mother if she goes to a facility.  Daughter reports that pt is active with [facility] for nursing and PT. OT was discontinued. She prefers that her mother returns home with her and the support of her husband. Her mother has been living with her for 15 years and she is her caregiver. She reports that her mother was able to walk with a walker prior to this admission. She is requesting that [facility] continues to provide the [facility] services.  Will continue to f/u to assist with the D/C plan.   Expected Discharge Plan: [place]     Patient Goals and CMS Choice        Expected Discharge Plan and Services Expected Discharge Plan: [place]   Discharge Planning Services: CM Consult   Living arrangements for the past 2 months: Single Family Home                                      Prior  Living Arrangements/Services Living arrangements for the past 2 months: Single Family Home Lives with:: Adult Children Patient language and need for interpreter reviewed:: Yes            Current home services: Home PT,Home RN    Activities of Daily Living Home Assistive Devices/Equipment: Dentures (specify type),Wheelchair,Walker (specify type),Bedside commode/3-in-1,CBG Meter,Oxygen ADL Screening (condition at time of admission) Patient's cognitive ability adequate to safely complete daily activities?: Yes Is the patient deaf or have difficulty hearing?: Yes Does the patient have difficulty seeing, even when wearing glasses/contacts?: Yes Does the patient have difficulty concentrating, remembering, or making decisions?: Yes Patient able to express need for assistance with ADLs?: Yes Does the patient have difficulty dressing or bathing?: Yes Independently performs ADLs?: No Communication: Independent Dressing (OT): Dependent Is this a change from baseline?: Change from baseline, expected to last >3 days Grooming: Dependent Is this a change from baseline?: Change from baseline, expected to last >3 days Feeding: Needs assistance Is this a change from baseline?: Change from baseline, expected to last >3 days Bathing: Dependent Is this a change from baseline?: Change from baseline, expected to last >3 days Toileting: Dependent Is this a change from baseline?: Change from baseline, expected to last >3days In/Out Bed: Needs assistance Is this a change from baseline?: Change from baseline, expected  to last >3 days Walks in Home: Needs assistance Is this a change from baseline?: Change from baseline, expected to last >3 days Does the patient have difficulty walking or climbing stairs?: Yes Weakness of Legs: Both Weakness of Arms/Hands: Both  Permission Sought/Granted                  Emotional Assessment              Admission diagnosis:  UTI (urinary tract infection)  [N39.0] Weakness [R53.1] Acute cystitis without hematuria [N30.00] Patient Active Problem List   Diagnosis Date Noted  . Pressure injury of skin 11/24/2020  . UTI (urinary tract infection) 11/22/2020  . Debility 11/22/2020  . Dementia ([place]) 11/22/2020  . Abnormal cardiac valve 11/22/2020  . DM2 (diabetes mellitus, type 2) ([place]) 11/22/2020   PCP:  Vicenta Aly, [place] Pharmacy:   [id number] [place], [address] [place] [address] Phone: [phone] Fax: [phone]     Social Determinants of Health (SDOH) Interventions    Readmission Risk Interventions No flowsheet data found.

## 2020-11-24 NOTE — Plan of Care (Signed)
  Problem: Pain Managment: Goal: General experience of comfort will improve Outcome: Progressing   Problem: Safety: Goal: Ability to remain free from injury will improve Outcome: Progressing   Problem: Skin Integrity: Goal: Risk for impaired skin integrity will decrease Outcome: Progressing   

## 2020-11-24 NOTE — Progress Notes (Signed)
Subjective: Patient was alert and conversive this morning.  No complaints of pain. Urine culture no growth  Objective: Vital signs in last 24 hours: Temp:  [96.6 F (35.9 C)-98 F (36.7 C)] 96.6 F (35.9 C) (02/05 0626) Pulse Rate:  [55-67] 57 (02/05 0626) Resp:  [16-20] 16 (02/05 0626) BP: (143-157)/(59-66) 143/61 (02/05 0626) SpO2:  [98 %-100 %] 100 % (02/05 0626) Weight:  [48.4 kg] 48.4 kg (02/04 1627)  Intake/Output from previous day: 02/04 0701 - 02/05 0700 In: 2056.4 [P.O.:120; I.V.:1836.4; IV Piggyback:100] Out: 1400 [Urine:1400] Intake/Output this shift: No intake/output data recorded.  Physical Exam:  General: Alert and oriented CV: RRR Lungs: Clear Abdomen: Soft, ND  Ext: NT, No erythema  Lab Results: Recent Labs    11/22/20 1542 11/23/20 0643 11/24/20 0649  HGB 11.9* 10.4* 14.2  HCT 35.0* 32.5* 43.4   BMET Recent Labs    11/23/20 0643 11/24/20 0649  NA 139 140  K 3.3* 3.3*  CL 102 108  CO2 26 25  GLUCOSE 122* 101*  BUN 13 10  CREATININE 0.63 0.65  CALCIUM 9.6 9.1     Studies/Results: CT HEAD WO CONTRAST  Result Date: 11/22/2020 CLINICAL DATA:  Focal neuro deficit greater than 6 hours. EXAM: CT HEAD WITHOUT CONTRAST TECHNIQUE: Contiguous axial images were obtained from the base of the skull through the vertex without intravenous contrast. COMPARISON:  CT head 05/25/2008 FINDINGS: Brain: Generalized atrophy. Extensive white matter hypodensity bilaterally, chronic. Negative for acute infarct, hemorrhage, mass. Vascular: Negative for hyperdense vessel Skull: Negative Sinuses/Orbits: Paranasal sinuses clear. Right mastoidectomy. Left mastoid clear. Bilateral cataract extraction. Other: None IMPRESSION: Atrophy and chronic white matter changes.  No acute abnormality. Electronically Signed   By: Franchot Gallo M.D.   On: 11/22/2020 17:40   DG Chest Portable 1 View  Result Date: 11/22/2020 CLINICAL DATA:  Weakness. EXAM: PORTABLE CHEST 1 VIEW  COMPARISON:  05/25/2008 FINDINGS: The heart size is mildly enlarged. Aortic calcifications are noted. There is no pneumothorax. No large pleural effusion. No acute osseous abnormality. IMPRESSION: 1. No acute cardiopulmonary disease. 2. Mild cardiomegaly. 3. Aortic calcifications. Electronically Signed   By: Constance Holster M.D.   On: 11/22/2020 16:52   ECHOCARDIOGRAM COMPLETE  Result Date: 11/23/2020    ECHOCARDIOGRAM REPORT   Patient Name:   Rebecca Pruitt Date of Exam: 11/23/2020 Medical Rec #:  [id number]         Height:       64.0 in Accession #:    [id number]        Weight:       132.0 lb Date of Birth:  11-19-1928         BSA:          1.640 m Patient Age:    87 years          BP:           142/66 mmHg Patient Gender: F                 HR:           50 bpm. Exam Location:  Inpatient Procedure: 2D Echo, 3D Echo, Cardiac Doppler and Color Doppler Indications:    I35.0 Nonrheumatic aortic (valve) stenosis  History:        Patient has prior history of Echocardiogram examinations, most                 recent 05/26/2008. CAD, Aortic Valve Disease and Mitral Valve  Disease, Signs/Symptoms:Altered Mental Status and Murmur; Risk                 Factors:Hypertension and Diabetes. Cancer. Dementia.  Sonographer:    Roseanna Rainbow RDCS Referring Phys: [place]  Sonographer Comments: Patient could not follow directions. IMPRESSIONS  1. Left ventricular ejection fraction, by estimation, is 65 to 70%. The left ventricle has normal function. The left ventricle has no regional wall motion abnormalities. There is severe left ventricular hypertrophy. Left ventricular diastolic parameters  are consistent with Grade II diastolic dysfunction (pseudonormalization).  2. Right ventricular systolic function is normal. The right ventricular size is normal. There is normal pulmonary artery systolic pressure.  3. Left atrial size was severely dilated.  4. Pericardial effusion - 1.1 cm. Moderate pericardial  effusion. The pericardial effusion is lateral to the left ventricle. There is no evidence of cardiac tamponade.  5. The mitral valve is normal in structure. Moderate mitral valve regurgitation. No evidence of mitral stenosis. The mean mitral valve gradient is 4.0 mmHg. Severe mitral annular calcification.  6. The aortic valve is tricuspid. Aortic valve regurgitation is not visualized. Severe aortic valve stenosis. Aortic valve area, by VTI measures 0.84 cm. Aortic valve mean gradient measures 38.3 mmHg. Aortic valve Vmax measures 3.97 m/s.  7. The inferior vena cava is normal in size with greater than 50% respiratory variability, suggesting right atrial pressure of 3 mmHg. FINDINGS  Left Ventricle: Left ventricular ejection fraction, by estimation, is 65 to 70%. The left ventricle has normal function. The left ventricle has no regional wall motion abnormalities. The left ventricular internal cavity size was normal in size. There is  severe left ventricular hypertrophy. Left ventricular diastolic parameters are consistent with Grade II diastolic dysfunction (pseudonormalization). Right Ventricle: The right ventricular size is normal. No increase in right ventricular wall thickness. Right ventricular systolic function is normal. There is normal pulmonary artery systolic pressure. The tricuspid regurgitant velocity is 2.41 m/s, and  with an assumed right atrial pressure of 3 mmHg, the estimated right ventricular systolic pressure is 94.8 mmHg. Left Atrium: Left atrial size was severely dilated. Right Atrium: Right atrial size was normal in size. Pericardium: Pericardial effusion - 1.1 cm. A moderately sized pericardial effusion is present. The pericardial effusion is lateral to the left ventricle. There is no evidence of cardiac tamponade. Mitral Valve: The mitral valve is normal in structure. There is moderate thickening of the mitral valve leaflet(s). There is moderate calcification of the mitral valve leaflet(s).  Severe mitral annular calcification. Moderate mitral valve regurgitation. No evidence of mitral valve stenosis. MV peak gradient, 11.8 mmHg. The mean mitral valve gradient is 4.0 mmHg. Tricuspid Valve: The tricuspid valve is normal in structure. Tricuspid valve regurgitation is not demonstrated. No evidence of tricuspid stenosis. Aortic Valve: The aortic valve is tricuspid. Aortic valve regurgitation is not visualized. Severe aortic stenosis is present. Aortic valve mean gradient measures 38.3 mmHg. Aortic valve peak gradient measures 63.1 mmHg. Aortic valve area, by VTI measures  0.84 cm. Pulmonic Valve: The pulmonic valve was normal in structure. Pulmonic valve regurgitation is trivial. No evidence of pulmonic stenosis. Aorta: The aortic root is normal in size and structure. Venous: The inferior vena cava is normal in size with greater than 50% respiratory variability, suggesting right atrial pressure of 3 mmHg. IAS/Shunts: No atrial level shunt detected by color flow Doppler.  LEFT VENTRICLE PLAX 2D LVIDd:         2.50 cm     Diastology  LVIDs:         1.70 cm     LV e' medial:    3.00 cm/s LV PW:         2.20 cm     LV E/e' medial:  46.3 LV IVS:        1.90 cm     LV e' lateral:   3.50 cm/s LVOT diam:     2.00 cm     LV E/e' lateral: 39.7 LV SV:         94 LV SV Index:   57 LVOT Area:     3.14 cm  LV Volumes (MOD) LV vol d, MOD A2C: 50.7 ml LV vol d, MOD A4C: 58.5 ml LV vol s, MOD A2C: 19.4 ml LV vol s, MOD A4C: 22.0 ml LV SV MOD A2C:     31.3 ml LV SV MOD A4C:     58.5 ml LV SV MOD BP:      38.6 ml RIGHT VENTRICLE            IVC RV S prime:     8.18 cm/s  IVC diam: 1.60 cm TAPSE (M-mode): 1.8 cm LEFT ATRIUM             Index       RIGHT ATRIUM          Index LA diam:        4.30 cm 2.62 cm/m  RA Area:     9.23 cm LA Vol (A2C):   72.6 ml 44.28 ml/m RA Volume:   15.40 ml 9.39 ml/m LA Vol (A4C):   78.5 ml 47.88 ml/m LA Biplane Vol: 74.5 ml 45.44 ml/m  AORTIC VALVE                    PULMONIC VALVE AV Area  (Vmax):    1.21 cm     PR End Diast Vel: 2.23 msec AV Area (Vmean):   0.87 cm AV Area (VTI):     0.84 cm AV Vmax:           397.33 cm/s AV Vmean:          291.000 cm/s AV VTI:            1.117 m AV Peak Grad:      63.1 mmHg AV Mean Grad:      38.3 mmHg LVOT Vmax:         153.00 cm/s LVOT Vmean:        80.700 cm/s LVOT VTI:          0.299 m LVOT/AV VTI ratio: 0.27  AORTA Ao Root diam: 3.00 cm Ao Asc diam:  3.10 cm MITRAL VALVE                 TRICUSPID VALVE MV Area (PHT): 1.50 cm      TR Peak grad:   23.2 mmHg MV Area VTI:   1.35 cm      TR Vmax:        241.00 cm/s MV Peak grad:  11.8 mmHg MV Mean grad:  4.0 mmHg      SHUNTS MV Vmax:       1.72 m/s      Systemic VTI:  0.30 m MV Vmean:      91.5 cm/s     Systemic Diam: 2.00 cm MV Decel Time: 507 msec MR Peak grad:    134.6 mmHg MR Mean grad:    82.0 mmHg  MR Vmax:         580.00 cm/s MR Vmean:        417.0 cm/s MR PISA:         4.02 cm MR PISA Eff ROA: 26 mm MR PISA Radius:  0.80 cm MV E velocity: 139.00 cm/s MV A velocity: 108.00 cm/s MV E/A ratio:  1.29 Candee Furbish MD Electronically signed by Candee Furbish MD Signature Date/Time: 11/23/2020/11:24:30 AM    Final     Assessment/Plan: Recent failure to thrive/ weakness improving on IV fluids and antibiotics.  Urine cultures negative Patient is scheduled for outpatient cystoscopy and retrograde and possible stent on 11/27/2020.  Do not feel she needs urgent stent at this point as she seems to improved significantly with IV fluids.  Could potentially be discharged home in keep scheduled follow-up outpatient appointment if medically stable.     LOS: 1 day   Remi Haggard 11/24/2020, 9:23 AM

## 2020-11-24 NOTE — Progress Notes (Signed)
PROGRESS NOTE    [name]  EUM:[id number] DOB: Jul 19, 1929 DOA: 11/22/2020 PCP: Vicenta Aly, FNP   Chief Complaint  Patient presents with  . Weakness   Brief Narrative:87 year old female with history of CVA, CAD, diabetes, hyperlipidemia, hypertension, bilateral nephrolithiasis with mild right hydroureteronephrosis -due to urothelial neoplasm or distal ureteral calculi based on Jan 25 imaging, chronic bladder outlet obstruction with large diverticulum in the bladder fundus,who has been brought with progressive generalized weakness over the past few weeks.  As per the report she is usually able to walk with a walker although there are difficulties but lately have had trouble even making a few steps. Followed by urology for recurrent urinary tract infection per family last urine culture did not grow anything. Patient was getting some preop testing  and had hard time getting out of a car requiring rapid response to be called. Family strongly suspects recurrent urinary tract infection. Family states she has not had any fevers no nausea no vomiting has had declined appetite. Recently been treated with Augmentin, Patient is scheduled for cystoscopy on 8 February due to "right kidney blockage"  In the ED found to have UTI and admitted for UTI and debility and encephalopathy.  Subjective:  Seen/examined Patient appears much more alert awake, oriented to self, appears conversant purewick suction canister showing hematuria. He is hard of hearing, legally blind.  Assessment & Plan:  UTI, with history of recurrent UTI/chronic bladder outlet obstruction with large diverticulum in the bladder fundus and bilateral nephrolithiasis with mild right hydroureteronephrosis, for which she was being worked up with cystoscopy for February 8 to rule out distal ureteral calculi versus urothelial neoplasm. Urology consulted-now with hematuria, urine culture no growth so far, continue ceftriaxone as  mentation appears to be improving. Per daughter  he has had 9-10 rounds of antibiotics since October and as soon as she is off antibiotis she does poorly. Dr Milford Cage from urology following, no need for urgent cystoscopy and advised to continue February 8 outpatient cystoscopy/retrograde and possible stent-unless anything changes here. Her urine culture from his office 1/31 and 10/25/20 no growth on cath urine sample, had ESBL E coli from pcp office from voided urine (less likely accurate).  Hematuria:Noted on purewick this am. Monitor h/h, I notified Dr Thomes Dinning, he thinks she may have hematuria, he will follow in am again.  Check CBC in a.m.  Debility, with multiple comorbidities obtain PT OT evaluation apparently she was able to walk 100 feet 3 weeks ago as per the daughter.    Dementia/very hard of hearing/slow to respond  and confused for few days suspect acute metabolic encephalopathy from UTI: appears more lucid today.Cont supportive care.  Very hard of hearing: better on left, and is legally blind.  Continue supportive measures.  Abnormal cardiac valves-as per daughter not a candidate for surgical intervention while in [place] echocardiogram was ordered on admission that showed EF 65 to 70% no regional wall motion abnormalities, severe LVH grade 2 diastolic dysfunction, moderate pericardial effusion no evidence of cardiac tamponade, moderate MR severe aortic valve stenosis.Currently hemodynamically stable.  DM2 :sugar stable on ssi. hb1c 7.5.Blood sugar controlled. Recent Labs  Lab 11/23/20 2040 11/24/20 0009 11/24/20 0452 11/24/20 0724 11/24/20 1127  GLUCAP 104* 107* 90 92 116*   Hypokalemia/hypophosphatemia- K improving at 3.3. cont to monitor.  HTN on lasix:on hold for now  History of CAD on plavix/abnormal cardiac valves/hyperlipidemia, echocardiogram pending, continue fibroids.  Hx of CVA- on plavix  held on admission, cont to hold 2/2  hematuria.  Nutrition: Diet Order             Diet Carb Modified Fluid consistency: Thin; Room service appropriate? Yes  Diet effective now                 Pt's Body mass index is 18.32 kg/m.  DVT prophylaxis: SCDs Start: 11/22/20 2313 Code Status:   Code Status: Partial Code-confirmed with the daughter no CPR but wants to proceed with rest of the care Family Communication: plan of care discussed with patient's daughter [name] in detail 11/23/20  Status is: Remains inpatient Patient remains hospitalized for ongoing management of hematuria, UTI Dispo: The patient is from: Home  W/ Daughter              Anticipated d/c is to: Home              Anticipated d/c date is:1- 2 days              Patient currently is not medically stable to d/c.   Difficult to place patient No  Consultants: Urology  Procedures:see note  Culture/Microbiology    Component Value Date/Time   SDES  11/22/2020 1603    URINE, CLEAN CATCH Performed at [facility], [place] [address], [place], [place] [address]    SPECREQUEST  11/22/2020 1603    NONE Performed at [facility], [place] [address], [place], [place] [address]    CULT  11/22/2020 1603    NO GROWTH Performed at [facility] [address] [place], [place] [address]    REPTSTATUS 11/24/2020 FINAL 11/22/2020 1603    Other culture-see note  Medications: Scheduled Meds: . citalopram  20 mg Oral QHS  . insulin aspart  0-9 Units Subcutaneous Q4H  . mouth rinse  15 mL Mouth Rinse BID   Continuous Infusions: . sodium chloride 100 mL/hr (11/24/20 0227)  . cefTRIAXone (ROCEPHIN)  IV 1 g (11/23/20 1829)    Antimicrobials: Anti-infectives (From admission, onward)   Start     Dose/Rate Route Frequency Ordered Stop   11/23/20 1800  cefTRIAXone (ROCEPHIN) 1 g in sodium chloride 0.9 % 100 mL IVPB        1 g 200 mL/hr over 30 Minutes Intravenous Every 24 hours 11/23/20 1159 11/26/20 1759   11/22/20 1845  cefTRIAXone (ROCEPHIN) 1 g in sodium chloride  0.9 % 100 mL IVPB        1 g 200 mL/hr over 30 Minutes Intravenous  Once 11/22/20 1841 11/22/20 1927     Objective: Vitals: Today's Vitals   11/23/20 1200 11/23/20 1500 11/23/20 1627 11/24/20 0626  BP: (!) 151/59 (!) 144/64 (!) 157/66 (!) 143/61  Pulse: (!) 55 62 67 (!) 57  Resp: 16 20 16 16   Temp:  98 F (36.7 C) 97.7 F (36.5 C) (!) 96.6 F (35.9 C)  TempSrc:   Oral Axillary  SpO2: 100% 100% 98% 100%  Weight:   48.4 kg   Height:   5\' 4"  (1.626 m)   PainSc:        Intake/Output Summary (Last 24 hours) at 11/24/2020 1221 Last data filed at 11/24/2020 1122 Gross per 24 hour  Intake 2905.1 ml  Output 800 ml  Net 2105.1 ml   Filed Weights   11/23/20 1627  Weight: 48.4 kg   Weight change:   Intake/Output from previous day: 02/04 0701 - 02/05 0700 In: 2056.4 [P.O.:120; I.V.:1836.4; IV Piggyback:100] Out: 1400 [Urine:1400] Intake/Output this shift: Total  I/O In: 848.7 [P.O.:150; I.V.:698.7] Out: 400 [Urine:400] Filed Weights   11/23/20 1627  Weight: 48.4 kg    Examination: General exam: AAOx2, conversant, elderly frail thin, not in acute distress.  HEENT:Oral mucosa moist, Ear/Nose WNL grossly, dentition normal. Respiratory system: bilaterally diminished,no wheezing or crackles,no use of accessory muscle Cardiovascular system: S1 & S2 +, No JVD,. Gastrointestinal system: Abdomen soft, NT,ND, BS+ Nervous System:Alert, awake, moving extremities and grossly nonfocal Extremities: No edema, distal peripheral pulses palpable.  Skin: No rashes,no icterus. MSK: thin muscle bulk,tone, power   Data Reviewed: I have personally reviewed following labs and imaging studies CBC: Recent Labs  Lab 11/22/20 1418 11/22/20 1524 11/22/20 1542 11/23/20 0643 11/24/20 0649  WBC 7.0 7.2  --  6.5 4.9  NEUTROABS  --  4.3  --  3.7  --   HGB 11.5* 11.7* 11.9* 10.4* 14.2  HCT 36.5 36.9 35.0* 32.5* 43.4  MCV 93.1 92.9  --  91.3 89.9  PLT 341 332  --  303 [id number]   Basic Metabolic  Panel: Recent Labs  Lab 11/22/20 1418 11/22/20 1524 11/22/20 1542 11/23/20 0643 11/24/20 0649  NA 137 138 137 139 140  K 3.6 3.9 3.9 3.3* 3.3*  CL 101 101 101 102 108  CO2 26 26  --  26 25  GLUCOSE 140* 154* 148* 122* 101*  BUN 16 17 17 13 10   CREATININE 0.73 0.82 0.70 0.63 0.65  CALCIUM 10.2 10.5*  --  9.6 9.1  MG  --   --   --  1.9  1.9  --   PHOS  --   --   --  2.3*  2.3*  --    GFR: Estimated Creatinine Clearance: 36.4 mL/min (by C-G formula based on SCr of 0.65 mg/dL). Liver Function Tests: Recent Labs  Lab 11/22/20 1524 11/23/20 0643  AST 10* 9*  ALT 9 8  ALKPHOS 44 37*  BILITOT 0.7 0.4  PROT 7.0 6.1*  ALBUMIN 3.0* 2.5*   No results for input(s): LIPASE, AMYLASE in the last 168 hours. No results for input(s): AMMONIA in the last 168 hours. Coagulation Profile: Recent Labs  Lab 11/22/20 1524  INR 1.1   Cardiac Enzymes: Recent Labs  Lab 11/23/20 0643  CKTOTAL 20*   BNP (last 3 results) No results for input(s): PROBNP in the last 8760 hours. HbA1C: Recent Labs    11/22/20 1418 11/23/20 0643  HGBA1C 7.5* 7.5*   CBG: Recent Labs  Lab 11/23/20 2040 11/24/20 0009 11/24/20 0452 11/24/20 0724 11/24/20 1127  GLUCAP 104* 107* 90 92 116*   Lipid Profile: No results for input(s): CHOL, HDL, LDLCALC, TRIG, CHOLHDL, LDLDIRECT in the last 72 hours. Thyroid Function Tests: Recent Labs    11/23/20 0643  TSH 1.392   Anemia Panel: No results for input(s): VITAMINB12, FOLATE, FERRITIN, TIBC, IRON, RETICCTPCT in the last 72 hours. Sepsis Labs: Recent Labs  Lab 11/22/20 1615  LATICACIDVEN 1.0    Recent Results (from the past 240 hour(s))  Urine Culture     Status: None   Collection Time: 11/22/20  4:03 PM   Specimen: Urine, Clean Catch  Result Value Ref Range Status   Specimen Description   Final    URINE, CLEAN CATCH Performed at [facility], [place] [address], [place], [place] [address]    Special Requests   Final     NONE Performed at [facility], [place] [address], [place], [place] [address]    Culture  Final    NO GROWTH Performed at [facility], [place] [address], [place], [place] [address]    Report Status 11/24/2020 FINAL  Final  SARS Coronavirus 2 by RT PCR (hospital order, performed in [facility] hospital lab) Nasopharyngeal Nasopharyngeal Swab     Status: None   Collection Time: 11/22/20  4:21 PM   Specimen: Nasopharyngeal Swab  Result Value Ref Range Status   SARS Coronavirus 2 NEGATIVE NEGATIVE Final    Comment: (NOTE) SARS-CoV-2 target nucleic acids are NOT DETECTED.  The SARS-CoV-2 RNA is generally detectable in upper and lower respiratory specimens during the acute phase of infection. The lowest concentration of SARS-CoV-2 viral copies this assay can detect is 250 copies / mL. A negative result does not preclude SARS-CoV-2 infection and should not be used as the sole basis for treatment or other patient management decisions.  A negative result may occur with improper specimen collection / handling, submission of specimen other than nasopharyngeal swab, presence of viral mutation(s) within the areas targeted by this assay, and inadequate number of viral copies (<250 copies / mL). A negative result must be combined with clinical observations, patient history, and epidemiological information.  Fact Sheet for Patients:   StrictlyIdeas.no  Fact Sheet for Healthcare Providers: BankingDealers.co.za  This test is not yet approved or  cleared by the [place] FDA and has been authorized for detection and/or diagnosis of SARS-CoV-2 by FDA under an Emergency Use Authorization (EUA).  This EUA will remain in effect (meaning this test can be used) for the duration of the COVID-19 declaration under Section 564(b)(1) of the Act, 21 U.S.C. section 360bbb-3(b)(1), unless the authorization is terminated or revoked  sooner.  Performed at [facility], [place] [address] [place], [place] [address]      Radiology Studies: CT HEAD WO CONTRAST  Result Date: 11/22/2020 CLINICAL DATA:  Focal neuro deficit greater than 6 hours. EXAM: CT HEAD WITHOUT CONTRAST TECHNIQUE: Contiguous axial images were obtained from the base of the skull through the vertex without intravenous contrast. COMPARISON:  CT head 05/25/2008 FINDINGS: Brain: Generalized atrophy. Extensive white matter hypodensity bilaterally, chronic. Negative for acute infarct, hemorrhage, mass. Vascular: Negative for hyperdense vessel Skull: Negative Sinuses/Orbits: Paranasal sinuses clear. Right mastoidectomy. Left mastoid clear. Bilateral cataract extraction. Other: None IMPRESSION: Atrophy and chronic white matter changes.  No acute abnormality. Electronically Signed   By: Franchot Gallo M.D.   On: 11/22/2020 17:40   DG Chest Portable 1 View  Result Date: 11/22/2020 CLINICAL DATA:  Weakness. EXAM: PORTABLE CHEST 1 VIEW COMPARISON:  05/25/2008 FINDINGS: The heart size is mildly enlarged. Aortic calcifications are noted. There is no pneumothorax. No large pleural effusion. No acute osseous abnormality. IMPRESSION: 1. No acute cardiopulmonary disease. 2. Mild cardiomegaly. 3. Aortic calcifications. Electronically Signed   By: Constance Holster M.D.   On: 11/22/2020 16:52   ECHOCARDIOGRAM COMPLETE  Result Date: 11/23/2020    ECHOCARDIOGRAM REPORT   Patient Name:   Rebecca Pruitt Date of Exam: 11/23/2020 Medical Rec #:  [id number]         Height:       64.0 in Accession #:    [id number]        Weight:       132.0 lb Date of Birth:  1929-10-05         BSA:          1.640 m Patient Age:    45 years  BP:           142/66 mmHg Patient Gender: F                 HR:           50 bpm. Exam Location:  Inpatient Procedure: 2D Echo, 3D Echo, Cardiac Doppler and Color Doppler Indications:    I35.0 Nonrheumatic aortic (valve) stenosis  History:         Patient has prior history of Echocardiogram examinations, most                 recent 05/26/2008. CAD, Aortic Valve Disease and Mitral Valve                 Disease, Signs/Symptoms:Altered Mental Status and Murmur; Risk                 Factors:Hypertension and Diabetes. Cancer. Dementia.  Sonographer:    Roseanna Rainbow RDCS Referring Phys: [place]  Sonographer Comments: Patient could not follow directions. IMPRESSIONS  1. Left ventricular ejection fraction, by estimation, is 65 to 70%. The left ventricle has normal function. The left ventricle has no regional wall motion abnormalities. There is severe left ventricular hypertrophy. Left ventricular diastolic parameters  are consistent with Grade II diastolic dysfunction (pseudonormalization).  2. Right ventricular systolic function is normal. The right ventricular size is normal. There is normal pulmonary artery systolic pressure.  3. Left atrial size was severely dilated.  4. Pericardial effusion - 1.1 cm. Moderate pericardial effusion. The pericardial effusion is lateral to the left ventricle. There is no evidence of cardiac tamponade.  5. The mitral valve is normal in structure. Moderate mitral valve regurgitation. No evidence of mitral stenosis. The mean mitral valve gradient is 4.0 mmHg. Severe mitral annular calcification.  6. The aortic valve is tricuspid. Aortic valve regurgitation is not visualized. Severe aortic valve stenosis. Aortic valve area, by VTI measures 0.84 cm. Aortic valve mean gradient measures 38.3 mmHg. Aortic valve Vmax measures 3.97 m/s.  7. The inferior vena cava is normal in size with greater than 50% respiratory variability, suggesting right atrial pressure of 3 mmHg. FINDINGS  Left Ventricle: Left ventricular ejection fraction, by estimation, is 65 to 70%. The left ventricle has normal function. The left ventricle has no regional wall motion abnormalities. The left ventricular internal cavity size was normal in size. There is   severe left ventricular hypertrophy. Left ventricular diastolic parameters are consistent with Grade II diastolic dysfunction (pseudonormalization). Right Ventricle: The right ventricular size is normal. No increase in right ventricular wall thickness. Right ventricular systolic function is normal. There is normal pulmonary artery systolic pressure. The tricuspid regurgitant velocity is 2.41 m/s, and  with an assumed right atrial pressure of 3 mmHg, the estimated right ventricular systolic pressure is [id number]. Left Atrium: Left atrial size was severely dilated. Right Atrium: Right atrial size was normal in size. Pericardium: Pericardial effusion - 1.1 cm. A moderately sized pericardial effusion is present. The pericardial effusion is lateral to the left ventricle. There is no evidence of cardiac tamponade. Mitral Valve: The mitral valve is normal in structure. There is moderate thickening of the mitral valve leaflet(s). There is moderate calcification of the mitral valve leaflet(s). Severe mitral annular calcification. Moderate mitral valve regurgitation. No evidence of mitral valve stenosis. MV peak gradient, 11.8 mmHg. The mean mitral valve gradient is 4.0 mmHg. Tricuspid Valve: The tricuspid valve is normal in structure. Tricuspid valve regurgitation is not demonstrated. No evidence  of tricuspid stenosis. Aortic Valve: The aortic valve is tricuspid. Aortic valve regurgitation is not visualized. Severe aortic stenosis is present. Aortic valve mean gradient measures 38.3 mmHg. Aortic valve peak gradient measures 63.1 mmHg. Aortic valve area, by VTI measures  0.84 cm. Pulmonic Valve: The pulmonic valve was normal in structure. Pulmonic valve regurgitation is trivial. No evidence of pulmonic stenosis. Aorta: The aortic root is normal in size and structure. Venous: The inferior vena cava is normal in size with greater than 50% respiratory variability, suggesting right atrial pressure of 3 mmHg. IAS/Shunts: No  atrial level shunt detected by color flow Doppler.  LEFT VENTRICLE PLAX 2D LVIDd:         2.50 cm     Diastology LVIDs:         1.70 cm     LV e' medial:    3.00 cm/s LV PW:         2.20 cm     LV E/e' medial:  46.3 LV IVS:        1.90 cm     LV e' lateral:   3.50 cm/s LVOT diam:     2.00 cm     LV E/e' lateral: 39.7 LV SV:         94 LV SV Index:   57 LVOT Area:     3.14 cm  LV Volumes (MOD) LV vol d, MOD A2C: 50.7 ml LV vol d, MOD A4C: 58.5 ml LV vol s, MOD A2C: 19.4 ml LV vol s, MOD A4C: 22.0 ml LV SV MOD A2C:     31.3 ml LV SV MOD A4C:     58.5 ml LV SV MOD BP:      38.6 ml RIGHT VENTRICLE            IVC RV S prime:     8.18 cm/s  IVC diam: 1.60 cm TAPSE (M-mode): 1.8 cm LEFT ATRIUM             Index       RIGHT ATRIUM          Index LA diam:        4.30 cm 2.62 cm/m  RA Area:     9.23 cm LA Vol (A2C):   72.6 ml 44.28 ml/m RA Volume:   15.40 ml 9.39 ml/m LA Vol (A4C):   78.5 ml 47.88 ml/m LA Biplane Vol: 74.5 ml 45.44 ml/m  AORTIC VALVE                    PULMONIC VALVE AV Area (Vmax):    1.21 cm     PR End Diast Vel: 2.23 msec AV Area (Vmean):   0.87 cm AV Area (VTI):     0.84 cm AV Vmax:           397.33 cm/s AV Vmean:          291.000 cm/s AV VTI:            1.117 m AV Peak Grad:      63.1 mmHg AV Mean Grad:      38.3 mmHg LVOT Vmax:         153.00 cm/s LVOT Vmean:        80.700 cm/s LVOT VTI:          0.299 m LVOT/AV VTI ratio: 0.27  AORTA Ao Root diam: 3.00 cm Ao Asc diam:  3.10 cm MITRAL VALVE  TRICUSPID VALVE MV Area (PHT): 1.50 cm      TR Peak grad:   23.2 mmHg MV Area VTI:   1.35 cm      TR Vmax:        241.00 cm/s MV Peak grad:  11.8 mmHg MV Mean grad:  4.0 mmHg      SHUNTS MV Vmax:       1.72 m/s      Systemic VTI:  0.30 m MV Vmean:      91.5 cm/s     Systemic Diam: 2.00 cm MV Decel Time: 507 msec MR Peak grad:    134.6 mmHg MR Mean grad:    82.0 mmHg MR Vmax:         580.00 cm/s MR Vmean:        417.0 cm/s MR PISA:         4.02 cm MR PISA Eff ROA: 26 mm MR PISA Radius:   0.80 cm MV E velocity: 139.00 cm/s MV A velocity: 108.00 cm/s MV E/A ratio:  1.29 Candee Furbish MD Electronically signed by Candee Furbish MD Signature Date/Time: 11/23/2020/11:24:30 AM    Final      LOS: 1 day   Antonieta Pert, MD [facility]  11/24/2020, 12:21 PM

## 2020-11-25 LAB — HEMOGLOBIN AND HEMATOCRIT, BLOOD
HCT: 30.1 % — ABNORMAL LOW (ref 36.0–46.0)
Hemoglobin: 9.5 g/dL — ABNORMAL LOW (ref 12.0–15.0)

## 2020-11-25 LAB — BASIC METABOLIC PANEL
Anion gap: 6 (ref 5–15)
BUN: 10 mg/dL (ref 8–23)
CO2: 24 mmol/L (ref 22–32)
Calcium: 9 mg/dL (ref 8.9–10.3)
Chloride: 111 mmol/L (ref 98–111)
Creatinine, Ser: 0.71 mg/dL (ref 0.44–1.00)
GFR, Estimated: >60 mL/min (ref >60)
Glucose, Bld: 117 mg/dL — ABNORMAL HIGH (ref 70–99)
Potassium: 3.3 mmol/L — ABNORMAL LOW (ref 3.5–5.1)
Sodium: 141 mmol/L (ref 135–145)

## 2020-11-25 LAB — CBC
HCT: 28 % — ABNORMAL LOW (ref 36.0–46.0)
Hemoglobin: 8.9 g/dL — ABNORMAL LOW (ref 12.0–15.0)
MCH: 29.1 pg (ref 26.0–34.0)
MCHC: 31.8 g/dL (ref 30.0–36.0)
MCV: 91.5 fL (ref 80.0–100.0)
Platelets: 298 10*3/uL (ref 150–400)
RBC: 3.06 MIL/uL — ABNORMAL LOW (ref 3.87–5.11)
RDW: 13.8 % (ref 11.5–15.5)
WBC: 6.7 10*3/uL (ref 4.0–10.5)
nRBC: 0 % (ref 0.0–0.2)

## 2020-11-25 LAB — GLUCOSE, CAPILLARY
Glucose-Capillary: 109 mg/dL — ABNORMAL HIGH (ref 70–99)
Glucose-Capillary: 110 mg/dL — ABNORMAL HIGH (ref 70–99)
Glucose-Capillary: 111 mg/dL — ABNORMAL HIGH (ref 70–99)
Glucose-Capillary: 122 mg/dL — ABNORMAL HIGH (ref 70–99)
Glucose-Capillary: 165 mg/dL — ABNORMAL HIGH (ref 70–99)
Glucose-Capillary: 187 mg/dL — ABNORMAL HIGH (ref 70–99)

## 2020-11-25 MED ORDER — ENSURE ENLIVE PO LIQD
237.0000 mL | Freq: Two times a day (BID) | ORAL | Status: DC
  Administered 2020-11-27 – 2020-11-30 (×5): 237 mL via ORAL

## 2020-11-25 MED ORDER — SODIUM CHLORIDE 0.9 % IV SOLN
1.0000 g | INTRAVENOUS | Status: DC
  Administered 2020-11-25 – 2020-11-29 (×5): 1 g via INTRAVENOUS
  Filled 2020-11-25 (×3): qty 1
  Filled 2020-11-25 (×2): qty 10
  Filled 2020-11-25: qty 0.06

## 2020-11-25 MED ORDER — POTASSIUM CHLORIDE CRYS ER 20 MEQ PO TBCR
40.0000 meq | EXTENDED_RELEASE_TABLET | Freq: Once | ORAL | Status: AC
  Administered 2020-11-25: 40 meq via ORAL
  Filled 2020-11-25: qty 2

## 2020-11-25 MED ORDER — SODIUM CHLORIDE 0.9 % IV SOLN: INTRAVENOUS | Status: DC

## 2020-11-25 NOTE — Progress Notes (Signed)
Initial Nutrition Assessment  RD working remotely.  DOCUMENTATION CODES:   Underweight  INTERVENTION:  Provide Ensure Enlive po BID, each supplement provides 350 kcal and 20 grams of protein.  Pt would benefit from nutrient dense supplement. Given pt's hx of DM, RD will reassess adequacy of PO intake, CBGS, and adjust supplement regimen as appropriate at follow-up.   NUTRITION DIAGNOSIS:   Increased nutrient needs related to acute illness as evidenced by estimated needs.  GOAL:   Patient will meet greater than or equal to 90% of their needs  MONITOR:   PO intake,Supplement acceptance,Labs,Weight trends,I & O's  REASON FOR ASSESSMENT:   Consult Assessment of nutrition requirement/status  ASSESSMENT:   87 year old female with PMHx of dementia, HTN, DM, hearing loss, hx CVA, bilateral nephrolithiasis with mild right hydroureteronephrosis, chronic bladder outlet obstruction with large diverticulum in bladder fundus admitted with recurrent UTI.   Unable to reach patient over the phone. Per review of chart patient with dementia and hearing impairment. Per MD note patient also legally blind. Patient is ordered for carbohydrate modified diet. Per chart she ate 100% of breakfast yesterday and 90% of breakfast today. No other documentation available. Patient ordered to be NPO tomorrow for procedure. She would benefit from oral nutrition supplements to help meet calorie/protein needs.  Limited weight history available in chart. Patient documented to be 48.4 kg (106.7 lbs) on 11/23/2020. Suspect weight of 59.9 kg from 2/3 is inaccurate.  Medications reviewed and include: Novolog 0-9 units Q4hrs, NS at 75 mL/hr, ceftriaxone.  Labs reviewed: CBG 109-111, Potassium 3.3.  Patient is at risk for malnutrition. Unable to determine if she meets criteria for malnutrition at this time.   NUTRITION - FOCUSED PHYSICAL EXAM:  Unable to complete as RD is working remotely.  Diet Order:   Diet  Order            Diet NPO time specified  Diet effective midnight           Diet Carb Modified Fluid consistency: Thin; Room service appropriate? Yes  Diet effective now                EDUCATION NEEDS:   No education needs have been identified at this time  Skin:     Last BM:     Height:   Ht Readings from Last 1 Encounters:  11/23/20 5\' 4"  (1.626 m)   Weight:   Wt Readings from Last 1 Encounters:  11/23/20 48.4 kg   BMI:  Body mass index is 18.32 kg/m.  Estimated Nutritional Needs:   Kcal:  1400-1600  Protein:  70-80 grams  Fluid:  1.2-1.4 L/day  Jacklynn Barnacle, MS, RD, LDN Pager number available on Amion

## 2020-11-25 NOTE — Progress Notes (Signed)
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  Concern was for recurrent UTI.  Patient was hospitalized.   And subsequently developed hematuria.  Urology is following.  Subjective: Not very communicative due to her dementia and hearing impairment.  Does not appear to be in any discomfort at this time.  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   Patient admitted with recurrent UTI.  Was placed on antibiotics Patient subsequently developed hematuria.  Urology was consulted. Plan is for inpatient cystoscopy and possible stent placement. Urine culture from urology office 1/31 and 10/25/20 no growth on cath urine sample, had ESBL E coli from pcp office from voided urine (less likely accurate). Patient currently on ceftriaxone.  Urine culture from this hospitalization without any growth so far.  Hematuria See above.    Debility, with multiple comorbidities PT and OT evaluation.  SNF is recommended.  History of dementia/acute metabolic encephalopathy Acute confusion likely due to UTI.  Does not appear to be in any discomfort though communication is difficult due to her hearing impairment and dementia.  Severe hearing impairment She is also legally blind.  Supportive care.     Severe aortic stenosis/moderate MR/severe LVH/grade 2 diastolic dysfunction  As per daughter not a candidate for surgical intervention while in [place]. Echocardiogram was ordered on admission that showed EF 65 to 70% no regional wall motion abnormalities, severe LVH grade 2 diastolic dysfunction, moderate pericardial effusion no evidence of cardiac tamponade, moderate MR, severe aortic valve stenosis. Seems to be stable from cardiac standpoint.  DM2 Monitor CBGs.  Continue SSI.  HbA1c 7.5.    Hypokalemia Potassium level noted to be 3.3 today.  Will supplement further.    Normocytic anemia/acute blood loss anemia Drop in hemoglobin noted.  Some of it could be due to hematuria.  We will recheck hemoglobin this afternoon.  Essential hypertension Lasix on hold currently.  Monitor blood pressures.  History of CAD Stable.  Plavix on hold.  Hx of CVA Stable.  Plavix on hold  DVT prophylaxis: SCD's Code Status:   Code Status: Partial Code Family Communication: No family at the bedside.  Daughter has been updated by urology. Disposition: Possible return home when improved.  Status is: Remains inpatient Patient remains hospitalized for ongoing management of hematuria, UTI Dispo: The patient is from: Home  W/ Daughter              Anticipated d/c is to: Home              Anticipated d/c date is:1- 2 days              Patient currently is not medically stable to d/c.   Difficult to place patient No  Consultants: Urology  Procedures:  Cystoscopy and possible stent placement planned for 2/7  Culture/Microbiology    Component Value Date/Time   SDES  11/22/2020 1603    URINE, CLEAN CATCH Performed  at [facility], [place] [address], [place], [place] [address]    SPECREQUEST  11/22/2020 1603    NONE Performed at [facility], [place] [address], [place], [place] [address]    CULT  11/22/2020 1603    NO GROWTH Performed at [place] [address], [place], [place] [address]    REPTSTATUS 11/24/2020 FINAL 11/22/2020 1603      Medications: Scheduled Meds: . citalopram  20 mg Oral QHS  . insulin aspart  0-9 Units Subcutaneous Q4H  . mouth rinse  15 mL Mouth Rinse BID   Continuous Infusions: . sodium chloride 100 mL/hr (11/25/20 1150)  . cefTRIAXone (ROCEPHIN)  IV 200 mL/hr at 11/24/20 1800    Antimicrobials: Anti-infectives (From admission, onward)   Start     Dose/Rate Route Frequency Ordered Stop   11/23/20 1800  cefTRIAXone (ROCEPHIN) 1 g in sodium chloride 0.9 % 100 mL IVPB        1 g 200 mL/hr over 30 Minutes Intravenous Every 24 hours 11/23/20 1159 11/26/20 1759   11/22/20 1845  cefTRIAXone (ROCEPHIN) 1 g in sodium chloride 0.9 % 100 mL IVPB        1 g 200 mL/hr over 30 Minutes Intravenous  Once 11/22/20 1841 11/22/20 1927     Objective: Vitals: Today's Vitals   11/24/20 1410 11/24/20 2049 11/24/20 2218 11/25/20 0402  BP: (!) 141/97 (!) 156/81  116/66  Pulse: 84 84  (!) 57  Resp: 14 (!) 24  20  Temp: 98.9 F (37.2 C) 99 F (37.2 C)  98.3 F (36.8 C)  TempSrc: Oral Oral  Oral  SpO2: 98% 98%  98%  Weight:      Height:      PainSc:   Asleep     Intake/Output Summary (Last 24 hours) at 11/25/2020 1227 Last data filed at 11/25/2020 1000 Gross per 24 hour  Intake 2842.94 ml  Output 1300 ml  Net 1542.94 ml   Filed Weights   11/23/20 1627  Weight: 48.4 kg   Weight change:   Intake/Output from previous day: 02/05 0701 - 02/06 0700 In: 2609.3 [P.O.:270; I.V.:2239.2; IV Piggyback:100.1] Out: 900 [Urine:900] Intake/Output this shift: Total I/O In: 1082.4 [P.O.:480; I.V.:602.4] Out: 800 [Urine:800] Filed Weights   11/23/20 1627  Weight: 48.4 kg    Examination: General appearance: Awake alert.  In no distress.  Distracted Resp: Clear to auscultation bilaterally.  Normal effort Cardio: S1-S2 is normal regular.  No S3-S4.  No rubs murmurs or bruit GI: Abdomen is soft.  Nontender  nondistended.  Bowel sounds are present normal.  No masses organomegaly Extremities: No edema.   Neurologic:  No focal neurological deficits.     Data Reviewed: I have personally reviewed following labs and imaging studies CBC: Recent Labs  Lab 11/22/20 1418 11/22/20 1524 11/22/20 1542 11/23/20 0643 11/24/20 0649 11/25/20 0558  WBC 7.0 7.2  --  6.5 4.9 6.7  NEUTROABS  --  4.3  --  3.7  --   --   HGB 11.5* 11.7* 11.9* 10.4* 14.2 8.9*  HCT 36.5 36.9 35.0* 32.5* 43.4 28.0*  MCV 93.1 92.9  --  91.3 89.9 91.5  PLT 341 332  --  303 223 [id number]   Basic Metabolic Panel: Recent Labs  Lab 11/22/20 1418 11/22/20 1524 11/22/20 1542 11/23/20 0643 11/24/20 0649 11/25/20 0558  NA 137 138 137 139 140 141  K 3.6 3.9 3.9 3.3* 3.3* 3.3*  CL 101 101 101  102 108 111  CO2 26 26  --  26 25 24   GLUCOSE 140* 154* 148* 122* 101* 117*  BUN 16 17 17 13 10 10   CREATININE 0.73 0.82 0.70 0.63 0.65 0.71  CALCIUM 10.2 10.5*  --  9.6 9.1 9.0  MG  --   --   --  1.9  1.9  --   --   PHOS  --   --   --  2.3*  2.3*  --   --    GFR: Estimated Creatinine Clearance: 36.4 mL/min (by C-G formula based on SCr of 0.71 mg/dL). Liver Function Tests: Recent Labs  Lab 11/22/20 1524 11/23/20 0643  AST 10* 9*  ALT 9 8  ALKPHOS 44 37*  BILITOT 0.7 0.4  PROT 7.0 6.1*  ALBUMIN 3.0* 2.5*   Coagulation Profile: Recent Labs  Lab 11/22/20 1524  INR 1.1   Cardiac Enzymes: Recent Labs  Lab 11/23/20 0643  CKTOTAL 20*   HbA1C: Recent Labs    11/22/20 1418 11/23/20 0643  HGBA1C 7.5* 7.5*   CBG: Recent Labs  Lab 11/24/20 2046 11/25/20 0018 11/25/20 0403 11/25/20 0724 11/25/20 1111  GLUCAP 195* 122* 110* 109* 111*   Thyroid Function Tests: Recent Labs    11/23/20 0643  TSH 1.392   Sepsis Labs: Recent Labs  Lab 11/22/20 1615  LATICACIDVEN 1.0    Recent Results (from the past 240 hour(s))  Urine Culture     Status: None   Collection Time: 11/22/20  4:03 PM   Specimen: Urine, Clean  Catch  Result Value Ref Range Status   Specimen Description   Final    URINE, CLEAN CATCH Performed at [facility], [place] [address] [place], [place] [address]    Special Requests   Final    NONE Performed at [facility], [place] [address] [place], [place] [address]    Culture   Final    NO GROWTH Performed at [facility], [place] [address] [place], [place] [address]    Report Status 11/24/2020 FINAL  Final  SARS Coronavirus 2 by RT PCR (hospital order, performed in [facility] hospital lab) Nasopharyngeal Nasopharyngeal Swab     Status: None   Collection Time: 11/22/20  4:21 PM   Specimen: Nasopharyngeal Swab  Result Value Ref Range Status   SARS Coronavirus 2 NEGATIVE NEGATIVE Final    Comment: (NOTE) SARS-CoV-2 target nucleic acids are NOT DETECTED.  The SARS-CoV-2 RNA is generally detectable in upper and lower respiratory specimens during the acute phase of infection. The lowest concentration of SARS-CoV-2 viral copies this assay can detect is 250 copies / mL. A negative result does not preclude SARS-CoV-2 infection and should not be used as the sole basis for treatment or other patient management decisions.  A negative result may occur with improper specimen collection / handling, submission of specimen other than nasopharyngeal swab, presence of viral mutation(s) within the areas targeted by this assay, and inadequate number of viral copies (<250 copies / mL). A negative result must be combined with clinical observations, patient history, and epidemiological information.  Fact Sheet for Patients:   StrictlyIdeas.no  Fact Sheet for Healthcare Providers: BankingDealers.co.za  This test is not yet approved or  cleared by the [place] FDA and has been authorized for detection and/or diagnosis of SARS-CoV-2 by FDA under an Emergency Use Authorization (EUA).  This EUA will  remain in effect (meaning this test can be used) for the duration of  the COVID-19 declaration under Section 564(b)(1) of the Act, 21 U.S.C. section 360bbb-3(b)(1), unless the authorization is terminated or revoked sooner.  Performed at [facility], [place] [address] [place], [place] [address]      Radiology Studies: No results found.   LOS: 2 days   Bonnielee Haff, MD [facility]  11/25/2020, 12:27 PM

## 2020-11-25 NOTE — Progress Notes (Signed)
Subjective: Patient resting comfortably.  Has been afebrile  Objective: Vital signs in last 24 hours: Temp:  [98.3 F (36.8 C)-99 F (37.2 C)] 98.3 F (36.8 C) (02/06 0402) Pulse Rate:  [57-84] 57 (02/06 0402) Resp:  [14-24] 20 (02/06 0402) BP: (116-156)/(66-97) 116/66 (02/06 0402) SpO2:  [98 %] 98 % (02/06 0402)  Intake/Output from previous day: 02/05 0701 - 02/06 0700 In: 2609.3 [P.O.:270; I.V.:2239.2; IV Piggyback:100.1] Out: 900 [Urine:900] Intake/Output this shift: No intake/output data recorded.  Physical Exam:  General: Alert and oriented  Lab Results: Recent Labs    11/23/20 0643 11/24/20 0649 11/25/20 0558  HGB 10.4* 14.2 8.9*  HCT 32.5* 43.4 28.0*   BMET Recent Labs    11/24/20 0649 11/25/20 0558  NA 140 141  K 3.3* 3.3*  CL 108 111  CO2 25 24  GLUCOSE 101* 117*  BUN 10 10  CREATININE 0.65 0.71  CALCIUM 9.1 9.0     Studies/Results: ECHOCARDIOGRAM COMPLETE  Result Date: 11/23/2020    ECHOCARDIOGRAM REPORT   Patient Name:   Rebecca Pruitt Date of Exam: 11/23/2020 Medical Rec #:  [id number]         Height:       64.0 in Accession #:    [id number]        Weight:       132.0 lb Date of Birth:  Jul 29, 1929         BSA:          1.640 m Patient Age:    87 years          BP:           142/66 mmHg Patient Gender: F                 HR:           50 bpm. Exam Location:  Inpatient Procedure: 2D Echo, 3D Echo, Cardiac Doppler and Color Doppler Indications:    I35.0 Nonrheumatic aortic (valve) stenosis  History:        Patient has prior history of Echocardiogram examinations, most                 recent 05/26/2008. CAD, Aortic Valve Disease and Mitral Valve                 Disease, Signs/Symptoms:Altered Mental Status and Murmur; Risk                 Factors:Hypertension and Diabetes. Cancer. Dementia.  Sonographer:    Roseanna Rainbow RDCS Referring Phys: [place]  Sonographer Comments: Patient could not follow directions. IMPRESSIONS  1. Left ventricular  ejection fraction, by estimation, is 65 to 70%. The left ventricle has normal function. The left ventricle has no regional wall motion abnormalities. There is severe left ventricular hypertrophy. Left ventricular diastolic parameters  are consistent with Grade II diastolic dysfunction (pseudonormalization).  2. Right ventricular systolic function is normal. The right ventricular size is normal. There is normal pulmonary artery systolic pressure.  3. Left atrial size was severely dilated.  4. Pericardial effusion - 1.1 cm. Moderate pericardial effusion. The pericardial effusion is lateral to the left ventricle. There is no evidence of cardiac tamponade.  5. The mitral valve is normal in structure. Moderate mitral valve regurgitation. No evidence of mitral stenosis. The mean mitral valve gradient is 4.0 mmHg. Severe mitral annular calcification.  6. The aortic valve is tricuspid. Aortic valve regurgitation is not visualized. Severe aortic valve stenosis. Aortic valve area,  by VTI measures 0.84 cm. Aortic valve mean gradient measures 38.3 mmHg. Aortic valve Vmax measures 3.97 m/s.  7. The inferior vena cava is normal in size with greater than 50% respiratory variability, suggesting right atrial pressure of 3 mmHg. FINDINGS  Left Ventricle: Left ventricular ejection fraction, by estimation, is 65 to 70%. The left ventricle has normal function. The left ventricle has no regional wall motion abnormalities. The left ventricular internal cavity size was normal in size. There is  severe left ventricular hypertrophy. Left ventricular diastolic parameters are consistent with Grade II diastolic dysfunction (pseudonormalization). Right Ventricle: The right ventricular size is normal. No increase in right ventricular wall thickness. Right ventricular systolic function is normal. There is normal pulmonary artery systolic pressure. The tricuspid regurgitant velocity is 2.41 m/s, and  with an assumed right atrial pressure of 3  mmHg, the estimated right ventricular systolic pressure is 09.4 mmHg. Left Atrium: Left atrial size was severely dilated. Right Atrium: Right atrial size was normal in size. Pericardium: Pericardial effusion - 1.1 cm. A moderately sized pericardial effusion is present. The pericardial effusion is lateral to the left ventricle. There is no evidence of cardiac tamponade. Mitral Valve: The mitral valve is normal in structure. There is moderate thickening of the mitral valve leaflet(s). There is moderate calcification of the mitral valve leaflet(s). Severe mitral annular calcification. Moderate mitral valve regurgitation. No evidence of mitral valve stenosis. MV peak gradient, 11.8 mmHg. The mean mitral valve gradient is 4.0 mmHg. Tricuspid Valve: The tricuspid valve is normal in structure. Tricuspid valve regurgitation is not demonstrated. No evidence of tricuspid stenosis. Aortic Valve: The aortic valve is tricuspid. Aortic valve regurgitation is not visualized. Severe aortic stenosis is present. Aortic valve mean gradient measures 38.3 mmHg. Aortic valve peak gradient measures 63.1 mmHg. Aortic valve area, by VTI measures  0.84 cm. Pulmonic Valve: The pulmonic valve was normal in structure. Pulmonic valve regurgitation is trivial. No evidence of pulmonic stenosis. Aorta: The aortic root is normal in size and structure. Venous: The inferior vena cava is normal in size with greater than 50% respiratory variability, suggesting right atrial pressure of 3 mmHg. IAS/Shunts: No atrial level shunt detected by color flow Doppler.  LEFT VENTRICLE PLAX 2D LVIDd:         2.50 cm     Diastology LVIDs:         1.70 cm     LV e' medial:    3.00 cm/s LV PW:         2.20 cm     LV E/e' medial:  46.3 LV IVS:        1.90 cm     LV e' lateral:   3.50 cm/s LVOT diam:     2.00 cm     LV E/e' lateral: 39.7 LV SV:         94 LV SV Index:   57 LVOT Area:     3.14 cm  LV Volumes (MOD) LV vol d, MOD A2C: 50.7 ml LV vol d, MOD A4C: 58.5 ml LV  vol s, MOD A2C: 19.4 ml LV vol s, MOD A4C: 22.0 ml LV SV MOD A2C:     31.3 ml LV SV MOD A4C:     58.5 ml LV SV MOD BP:      38.6 ml RIGHT VENTRICLE            IVC RV S prime:     8.18 cm/s  IVC diam: 1.60 cm TAPSE (M-mode): 1.8  cm LEFT ATRIUM             Index       RIGHT ATRIUM          Index LA diam:        4.30 cm 2.62 cm/m  RA Area:     9.23 cm LA Vol (A2C):   72.6 ml 44.28 ml/m RA Volume:   15.40 ml 9.39 ml/m LA Vol (A4C):   78.5 ml 47.88 ml/m LA Biplane Vol: 74.5 ml 45.44 ml/m  AORTIC VALVE                    PULMONIC VALVE AV Area (Vmax):    1.21 cm     PR End Diast Vel: 2.23 msec AV Area (Vmean):   0.87 cm AV Area (VTI):     0.84 cm AV Vmax:           397.33 cm/s AV Vmean:          291.000 cm/s AV VTI:            1.117 m AV Peak Grad:      63.1 mmHg AV Mean Grad:      38.3 mmHg LVOT Vmax:         153.00 cm/s LVOT Vmean:        80.700 cm/s LVOT VTI:          0.299 m LVOT/AV VTI ratio: 0.27  AORTA Ao Root diam: 3.00 cm Ao Asc diam:  3.10 cm MITRAL VALVE                 TRICUSPID VALVE MV Area (PHT): 1.50 cm      TR Peak grad:   23.2 mmHg MV Area VTI:   1.35 cm      TR Vmax:        241.00 cm/s MV Peak grad:  11.8 mmHg MV Mean grad:  4.0 mmHg      SHUNTS MV Vmax:       1.72 m/s      Systemic VTI:  0.30 m MV Vmean:      91.5 cm/s     Systemic Diam: 2.00 cm MV Decel Time: 507 msec MR Peak grad:    134.6 mmHg MR Mean grad:    82.0 mmHg MR Vmax:         580.00 cm/s MR Vmean:        417.0 cm/s MR PISA:         4.02 cm MR PISA Eff ROA: 26 mm MR PISA Radius:  0.80 cm MV E velocity: 139.00 cm/s MV A velocity: 108.00 cm/s MV E/A ratio:  1.29 Candee Furbish MD Electronically signed by Candee Furbish MD Signature Date/Time: 11/23/2020/11:24:30 AM    Final     Assessment/Plan: Patient with recent failure to thrive and weakness improved on IV hydration and antibiotics despite urine cultures being negative. I have arranged for cystoscopy to be moved up to tomorrow on 11/26/2020 for cystoscopy and retrograde and  possible JJ stent insertion.  Felt it would be best to go ahead and do this while she is in-house. I spoke with daughter on telephone regarding the procedure being moved up.  We will make further disposition regarding management after cystoscopy and retrogrades tomorrow.  Recommend staying on current antibiotics and holding anticoagulation for now.   LOS: 2 days   Remi Haggard 11/25/2020, 8:28 AM

## 2020-11-25 NOTE — Plan of Care (Signed)

## 2020-11-25 NOTE — H&P (View-Only) (Signed)
Subjective: Patient resting comfortably.  Has been afebrile  Objective: Vital signs in last 24 hours: Temp:  [98.3 F (36.8 C)-99 F (37.2 C)] 98.3 F (36.8 C) (02/06 0402) Pulse Rate:  [57-84] 57 (02/06 0402) Resp:  [14-24] 20 (02/06 0402) BP: (116-156)/(66-97) 116/66 (02/06 0402) SpO2:  [98 %] 98 % (02/06 0402)  Intake/Output from previous day: 02/05 0701 - 02/06 0700 In: 2609.3 [P.O.:270; I.V.:2239.2; IV Piggyback:100.1] Out: 900 [Urine:900] Intake/Output this shift: No intake/output data recorded.  Physical Exam:  General: Alert and oriented  Lab Results: Recent Labs    11/23/20 0643 11/24/20 0649 11/25/20 0558  HGB 10.4* 14.2 8.9*  HCT 32.5* 43.4 28.0*   BMET Recent Labs    11/24/20 0649 11/25/20 0558  NA 140 141  K 3.3* 3.3*  CL 108 111  CO2 25 24  GLUCOSE 101* 117*  BUN 10 10  CREATININE 0.65 0.71  CALCIUM 9.1 9.0     Studies/Results: ECHOCARDIOGRAM COMPLETE  Result Date: 11/23/2020    ECHOCARDIOGRAM REPORT   Patient Name:   Marjie Chern Date of Exam: 11/23/2020 Medical Rec #:  [id number]         Height:       64.0 in Accession #:    [id number]        Weight:       132.0 lb Date of Birth:  Jul 29, 1929         BSA:          1.640 m Patient Age:    87 years          BP:           142/66 mmHg Patient Gender: F                 HR:           50 bpm. Exam Location:  Inpatient Procedure: 2D Echo, 3D Echo, Cardiac Doppler and Color Doppler Indications:    I35.0 Nonrheumatic aortic (valve) stenosis  History:        Patient has prior history of Echocardiogram examinations, most                 recent 05/26/2008. CAD, Aortic Valve Disease and Mitral Valve                 Disease, Signs/Symptoms:Altered Mental Status and Murmur; Risk                 Factors:Hypertension and Diabetes. Cancer. Dementia.  Sonographer:    Roseanna Rainbow RDCS Referring Phys: [place]  Sonographer Comments: Patient could not follow directions. IMPRESSIONS  1. Left ventricular  ejection fraction, by estimation, is 65 to 70%. The left ventricle has normal function. The left ventricle has no regional wall motion abnormalities. There is severe left ventricular hypertrophy. Left ventricular diastolic parameters  are consistent with Grade II diastolic dysfunction (pseudonormalization).  2. Right ventricular systolic function is normal. The right ventricular size is normal. There is normal pulmonary artery systolic pressure.  3. Left atrial size was severely dilated.  4. Pericardial effusion - 1.1 cm. Moderate pericardial effusion. The pericardial effusion is lateral to the left ventricle. There is no evidence of cardiac tamponade.  5. The mitral valve is normal in structure. Moderate mitral valve regurgitation. No evidence of mitral stenosis. The mean mitral valve gradient is 4.0 mmHg. Severe mitral annular calcification.  6. The aortic valve is tricuspid. Aortic valve regurgitation is not visualized. Severe aortic valve stenosis. Aortic valve area,  by VTI measures 0.84 cm. Aortic valve mean gradient measures 38.3 mmHg. Aortic valve Vmax measures 3.97 m/s.  7. The inferior vena cava is normal in size with greater than 50% respiratory variability, suggesting right atrial pressure of 3 mmHg. FINDINGS  Left Ventricle: Left ventricular ejection fraction, by estimation, is 65 to 70%. The left ventricle has normal function. The left ventricle has no regional wall motion abnormalities. The left ventricular internal cavity size was normal in size. There is  severe left ventricular hypertrophy. Left ventricular diastolic parameters are consistent with Grade II diastolic dysfunction (pseudonormalization). Right Ventricle: The right ventricular size is normal. No increase in right ventricular wall thickness. Right ventricular systolic function is normal. There is normal pulmonary artery systolic pressure. The tricuspid regurgitant velocity is 2.41 m/s, and  with an assumed right atrial pressure of 3  mmHg, the estimated right ventricular systolic pressure is 09.4 mmHg. Left Atrium: Left atrial size was severely dilated. Right Atrium: Right atrial size was normal in size. Pericardium: Pericardial effusion - 1.1 cm. A moderately sized pericardial effusion is present. The pericardial effusion is lateral to the left ventricle. There is no evidence of cardiac tamponade. Mitral Valve: The mitral valve is normal in structure. There is moderate thickening of the mitral valve leaflet(s). There is moderate calcification of the mitral valve leaflet(s). Severe mitral annular calcification. Moderate mitral valve regurgitation. No evidence of mitral valve stenosis. MV peak gradient, 11.8 mmHg. The mean mitral valve gradient is 4.0 mmHg. Tricuspid Valve: The tricuspid valve is normal in structure. Tricuspid valve regurgitation is not demonstrated. No evidence of tricuspid stenosis. Aortic Valve: The aortic valve is tricuspid. Aortic valve regurgitation is not visualized. Severe aortic stenosis is present. Aortic valve mean gradient measures 38.3 mmHg. Aortic valve peak gradient measures 63.1 mmHg. Aortic valve area, by VTI measures  0.84 cm. Pulmonic Valve: The pulmonic valve was normal in structure. Pulmonic valve regurgitation is trivial. No evidence of pulmonic stenosis. Aorta: The aortic root is normal in size and structure. Venous: The inferior vena cava is normal in size with greater than 50% respiratory variability, suggesting right atrial pressure of 3 mmHg. IAS/Shunts: No atrial level shunt detected by color flow Doppler.  LEFT VENTRICLE PLAX 2D LVIDd:         2.50 cm     Diastology LVIDs:         1.70 cm     LV e' medial:    3.00 cm/s LV PW:         2.20 cm     LV E/e' medial:  46.3 LV IVS:        1.90 cm     LV e' lateral:   3.50 cm/s LVOT diam:     2.00 cm     LV E/e' lateral: 39.7 LV SV:         94 LV SV Index:   57 LVOT Area:     3.14 cm  LV Volumes (MOD) LV vol d, MOD A2C: 50.7 ml LV vol d, MOD A4C: 58.5 ml LV  vol s, MOD A2C: 19.4 ml LV vol s, MOD A4C: 22.0 ml LV SV MOD A2C:     31.3 ml LV SV MOD A4C:     58.5 ml LV SV MOD BP:      38.6 ml RIGHT VENTRICLE            IVC RV S prime:     8.18 cm/s  IVC diam: 1.60 cm TAPSE (M-mode): 1.8  cm LEFT ATRIUM             Index       RIGHT ATRIUM          Index LA diam:        4.30 cm 2.62 cm/m  RA Area:     9.23 cm LA Vol (A2C):   72.6 ml 44.28 ml/m RA Volume:   15.40 ml 9.39 ml/m LA Vol (A4C):   78.5 ml 47.88 ml/m LA Biplane Vol: 74.5 ml 45.44 ml/m  AORTIC VALVE                    PULMONIC VALVE AV Area (Vmax):    1.21 cm     PR End Diast Vel: 2.23 msec AV Area (Vmean):   0.87 cm AV Area (VTI):     0.84 cm AV Vmax:           397.33 cm/s AV Vmean:          291.000 cm/s AV VTI:            1.117 m AV Peak Grad:      63.1 mmHg AV Mean Grad:      38.3 mmHg LVOT Vmax:         153.00 cm/s LVOT Vmean:        80.700 cm/s LVOT VTI:          0.299 m LVOT/AV VTI ratio: 0.27  AORTA Ao Root diam: 3.00 cm Ao Asc diam:  3.10 cm MITRAL VALVE                 TRICUSPID VALVE MV Area (PHT): 1.50 cm      TR Peak grad:   23.2 mmHg MV Area VTI:   1.35 cm      TR Vmax:        241.00 cm/s MV Peak grad:  11.8 mmHg MV Mean grad:  4.0 mmHg      SHUNTS MV Vmax:       1.72 m/s      Systemic VTI:  0.30 m MV Vmean:      91.5 cm/s     Systemic Diam: 2.00 cm MV Decel Time: 507 msec MR Peak grad:    134.6 mmHg MR Mean grad:    82.0 mmHg MR Vmax:         580.00 cm/s MR Vmean:        417.0 cm/s MR PISA:         4.02 cm MR PISA Eff ROA: 26 mm MR PISA Radius:  0.80 cm MV E velocity: 139.00 cm/s MV A velocity: 108.00 cm/s MV E/A ratio:  1.29 Candee Furbish MD Electronically signed by Candee Furbish MD Signature Date/Time: 11/23/2020/11:24:30 AM    Final     Assessment/Plan: Patient with recent failure to thrive and weakness improved on IV hydration and antibiotics despite urine cultures being negative. I have arranged for cystoscopy to be moved up to tomorrow on 11/26/2020 for cystoscopy and retrograde and  possible JJ stent insertion.  Felt it would be best to go ahead and do this while she is in-house. I spoke with daughter on telephone regarding the procedure being moved up.  We will make further disposition regarding management after cystoscopy and retrogrades tomorrow.  Recommend staying on current antibiotics and holding anticoagulation for now.   LOS: 2 days   Remi Haggard 11/25/2020, 8:28 AM

## 2020-11-26 ENCOUNTER — Inpatient Hospital Stay (HOSPITAL_COMMUNITY): Payer: Medicare Other

## 2020-11-26 ENCOUNTER — Inpatient Hospital Stay (HOSPITAL_COMMUNITY): Payer: Medicare Other | Admitting: Certified Registered Nurse Anesthetist

## 2020-11-26 ENCOUNTER — Encounter (HOSPITAL_COMMUNITY): Payer: Self-pay | Admitting: Internal Medicine

## 2020-11-26 ENCOUNTER — Encounter (HOSPITAL_COMMUNITY)
Admission: EM | Disposition: A | Discharge status: Home / Self Care | Payer: Self-pay | Source: Home / Self Care | Attending: Internal Medicine

## 2020-11-26 HISTORY — PX: CYSTOSCOPY WITH RETROGRADE PYELOGRAM, URETEROSCOPY AND STENT PLACEMENT: SHX5789

## 2020-11-26 LAB — RETICULOCYTES
Immature Retic Fract: 21.4 % — ABNORMAL HIGH (ref 2.3–15.9)
RBC.: 2.69 MIL/uL — ABNORMAL LOW (ref 3.87–5.11)
Retic Count, Absolute: 48.2 10*3/uL (ref 19.0–186.0)
Retic Ct Pct: 1.8 % (ref 0.4–3.1)

## 2020-11-26 LAB — CBC
HCT: 26 % — ABNORMAL LOW (ref 36.0–46.0)
Hemoglobin: 8 g/dL — ABNORMAL LOW (ref 12.0–15.0)
MCH: 29.4 pg (ref 26.0–34.0)
MCHC: 30.8 g/dL (ref 30.0–36.0)
MCV: 95.6 fL (ref 80.0–100.0)
Platelets: 295 10*3/uL (ref 150–400)
RBC: 2.72 MIL/uL — ABNORMAL LOW (ref 3.87–5.11)
RDW: 14.1 % (ref 11.5–15.5)
WBC: 7.8 10*3/uL (ref 4.0–10.5)
nRBC: 0 % (ref 0.0–0.2)

## 2020-11-26 LAB — VITAMIN B12: Vitamin B-12: 504 pg/mL (ref 180–914)

## 2020-11-26 LAB — GLUCOSE, CAPILLARY
Glucose-Capillary: 107 mg/dL — ABNORMAL HIGH (ref 70–99)
Glucose-Capillary: 124 mg/dL — ABNORMAL HIGH (ref 70–99)
Glucose-Capillary: 141 mg/dL — ABNORMAL HIGH (ref 70–99)
Glucose-Capillary: 144 mg/dL — ABNORMAL HIGH (ref 70–99)
Glucose-Capillary: 227 mg/dL — ABNORMAL HIGH (ref 70–99)

## 2020-11-26 LAB — SURGICAL PCR SCREEN
MRSA, PCR: NEGATIVE
Staphylococcus aureus: NEGATIVE

## 2020-11-26 LAB — BASIC METABOLIC PANEL
Anion gap: 8 (ref 5–15)
BUN: 10 mg/dL (ref 8–23)
CO2: 24 mmol/L (ref 22–32)
Calcium: 9.1 mg/dL (ref 8.9–10.3)
Chloride: 109 mmol/L (ref 98–111)
Creatinine, Ser: 0.57 mg/dL (ref 0.44–1.00)
GFR, Estimated: >60 mL/min (ref >60)
Glucose, Bld: 133 mg/dL — ABNORMAL HIGH (ref 70–99)
Potassium: 4.1 mmol/L (ref 3.5–5.1)
Sodium: 141 mmol/L (ref 135–145)

## 2020-11-26 LAB — IRON AND TIBC
Iron: 30 ug/dL (ref 28–170)
Saturation Ratios: 13 % (ref 10.4–31.8)
TIBC: 234 ug/dL — ABNORMAL LOW (ref 250–450)
UIBC: 204 ug/dL

## 2020-11-26 LAB — HEMOGLOBIN AND HEMATOCRIT, BLOOD
HCT: 29.1 % — ABNORMAL LOW (ref 36.0–46.0)
Hemoglobin: 9 g/dL — ABNORMAL LOW (ref 12.0–15.0)

## 2020-11-26 LAB — FERRITIN: Ferritin: 23 ng/mL (ref 11–307)

## 2020-11-26 LAB — ABO/RH: ABO/RH(D): O NEG

## 2020-11-26 LAB — FOLATE: Folate: 8.1 ng/mL (ref >5.9)

## 2020-11-26 SURGERY — CYSTOURETEROSCOPY, WITH RETROGRADE PYELOGRAM AND STENT INSERTION
Anesthesia: General | Site: Ureter | Laterality: Right

## 2020-11-26 MED ORDER — EPHEDRINE 5 MG/ML INJ
INTRAVENOUS | Status: AC
  Filled 2020-11-26: qty 10

## 2020-11-26 MED ORDER — SODIUM CHLORIDE 0.9 % IR SOLN
3000.0000 mL | Status: DC
  Administered 2020-11-27: 3000 mL

## 2020-11-26 MED ORDER — HYDROMORPHONE HCL 1 MG/ML IJ SOLN: 0.2500 mg | INTRAMUSCULAR | Status: DC | PRN

## 2020-11-26 MED ORDER — SODIUM CHLORIDE 0.9 % IR SOLN
Status: DC | PRN
  Administered 2020-11-26: 12000 mL

## 2020-11-26 MED ORDER — PHENYLEPHRINE 40 MCG/ML (10ML) SYRINGE FOR IV PUSH (FOR BLOOD PRESSURE SUPPORT)
PREFILLED_SYRINGE | INTRAVENOUS | Status: DC | PRN
  Administered 2020-11-26 (×2): 80 ug via INTRAVENOUS

## 2020-11-26 MED ORDER — PROMETHAZINE HCL 25 MG/ML IJ SOLN: 6.2500 mg | INTRAMUSCULAR | Status: DC | PRN

## 2020-11-26 MED ORDER — LACTATED RINGERS IV SOLN: INTRAVENOUS | Status: DC

## 2020-11-26 MED ORDER — FENTANYL CITRATE (PF) 100 MCG/2ML IJ SOLN
INTRAMUSCULAR | Status: AC
  Filled 2020-11-26: qty 2

## 2020-11-26 MED ORDER — PHENYLEPHRINE 40 MCG/ML (10ML) SYRINGE FOR IV PUSH (FOR BLOOD PRESSURE SUPPORT)
PREFILLED_SYRINGE | INTRAVENOUS | Status: AC
  Filled 2020-11-26: qty 10

## 2020-11-26 MED ORDER — PROPOFOL 10 MG/ML IV BOLUS
INTRAVENOUS | Status: DC | PRN
  Administered 2020-11-26: 60 mg via INTRAVENOUS

## 2020-11-26 MED ORDER — EPHEDRINE SULFATE-NACL 50-0.9 MG/10ML-% IV SOSY
PREFILLED_SYRINGE | INTRAVENOUS | Status: DC | PRN
  Administered 2020-11-26: 10 mg via INTRAVENOUS

## 2020-11-26 MED ORDER — ESMOLOL HCL 100 MG/10ML IV SOLN
INTRAVENOUS | Status: DC | PRN
  Administered 2020-11-26 (×2): 20 mg via INTRAVENOUS
  Administered 2020-11-26: 10 mg via INTRAVENOUS

## 2020-11-26 MED ORDER — SODIUM CHLORIDE 0.9 % IR SOLN
Status: DC | PRN
  Administered 2020-11-26: 1000 mL

## 2020-11-26 MED ORDER — CHLORHEXIDINE GLUCONATE CLOTH 2 % EX PADS
6.0000 | MEDICATED_PAD | Freq: Every day | CUTANEOUS | Status: DC
  Administered 2020-11-26 – 2020-11-28 (×3): 6 via TOPICAL

## 2020-11-26 MED ORDER — ESMOLOL HCL 100 MG/10ML IV SOLN
INTRAVENOUS | Status: AC
  Filled 2020-11-26: qty 10

## 2020-11-26 MED ORDER — OXYCODONE HCL 5 MG PO TABS
5.0000 mg | ORAL_TABLET | Freq: Once | ORAL | Status: DC | PRN
Start: 2020-11-26 — End: 2020-11-26

## 2020-11-26 MED ORDER — IOHEXOL 300 MG/ML  SOLN
INTRAMUSCULAR | Status: DC | PRN
  Administered 2020-11-26: 30 mL

## 2020-11-26 MED ORDER — AMISULPRIDE (ANTIEMETIC) 5 MG/2ML IV SOLN: 10.0000 mg | Freq: Once | INTRAVENOUS | Status: DC | PRN

## 2020-11-26 MED ORDER — FENTANYL CITRATE (PF) 100 MCG/2ML IJ SOLN
INTRAMUSCULAR | Status: DC | PRN
  Administered 2020-11-26 (×8): 25 ug via INTRAVENOUS

## 2020-11-26 MED ORDER — STERILE WATER FOR IRRIGATION IR SOLN
Status: DC | PRN
  Administered 2020-11-26: 30000 mL

## 2020-11-26 MED ORDER — OXYCODONE HCL 5 MG/5ML PO SOLN
5.0000 mg | Freq: Once | ORAL | Status: DC | PRN
Start: 2020-11-26 — End: 2020-11-26

## 2020-11-26 MED ORDER — LIDOCAINE 2% (20 MG/ML) 5 ML SYRINGE
INTRAMUSCULAR | Status: DC | PRN
  Administered 2020-11-26: 40 mg via INTRAVENOUS

## 2020-11-26 MED ORDER — ONDANSETRON HCL 4 MG/2ML IJ SOLN
INTRAMUSCULAR | Status: DC | PRN
  Administered 2020-11-26: 4 mg via INTRAVENOUS

## 2020-11-26 SURGICAL SUPPLY — 31 items
BAG URO CATCHER STRL LF (MISCELLANEOUS) ×2 IMPLANT
BASKET ZERO TIP NITINOL 2.4FR (BASKET) IMPLANT
BSKT STON RTRVL ZERO TP 2.4FR (BASKET)
BULB IRRIG PATHFIND (MISCELLANEOUS) ×3 IMPLANT
CABLE HIGH FREQUENCY MONO STRZ (ELECTRODE) ×1 IMPLANT
CATH FOLEY 3WAY 30CC 24FR (CATHETERS) ×2
CATH URET 5FR 28IN OPEN ENDED (CATHETERS) IMPLANT
CATH URTH STD 24FR FL 3W 2 (CATHETERS) IMPLANT
CLOTH BEACON ORANGE TIMEOUT ST (SAFETY) ×2 IMPLANT
ELECT COAG BALL END 3FR (ELECTROSURGICAL) ×2
ELECTRODE COAG BALL END 3FR (ELECTROSURGICAL) IMPLANT
GLOVE SURG ENC TEXT LTX SZ7.5 (GLOVE) ×2 IMPLANT
GOWN STRL REUS W/TWL XL LVL3 (GOWN DISPOSABLE) ×2 IMPLANT
GUIDEWIRE ANG ZIPWIRE 038X150 (WIRE) IMPLANT
GUIDEWIRE STR DUAL SENSOR (WIRE) ×2 IMPLANT
IV NS 1000ML (IV SOLUTION) ×2
IV NS 1000ML BAXH (IV SOLUTION) ×1 IMPLANT
KIT TURNOVER KIT A (KITS) IMPLANT
LASER FIB FLEXIVA PULSE ID 365 (Laser) IMPLANT
LOOP CUT BIPOLAR 24F LRG (ELECTROSURGICAL) ×1 IMPLANT
MANIFOLD NEPTUNE II (INSTRUMENTS) ×2 IMPLANT
PACK CYSTO (CUSTOM PROCEDURE TRAY) ×2 IMPLANT
PLUG CATH AND CAP STER (CATHETERS) ×1 IMPLANT
SHEATH URETERAL 12FRX35CM (MISCELLANEOUS) ×1 IMPLANT
STENT URET 6FRX24 CONTOUR (STENTS) ×1 IMPLANT
SYR 20ML LL LF (SYRINGE) ×2 IMPLANT
SYR 30ML LL (SYRINGE) ×1 IMPLANT
TRACTIP FLEXIVA PULS ID 200XHI (Laser) IMPLANT
TRACTIP FLEXIVA PULSE ID 200 (Laser)
TUBING CONNECTING 10 (TUBING) ×2 IMPLANT
TUBING UROLOGY SET (TUBING) ×2 IMPLANT

## 2020-11-26 NOTE — Transfer of Care (Signed)
Immediate Anesthesia Transfer of Care Note  Patient: Rebecca Pruitt  Procedure(s) Performed: CYSTOSCOPY WITH RIGHT RETROGRADE PYELOGRAM, RIGHT URETEROSCOPY AND PYELOSCOPY, RIGHT STENT PLACEMENT, FULGERATION OF RIGHT URETER AND BLADDER (Right Ureter)  Patient Location: PACU  Anesthesia Type:General  Level of Consciousness: awake  Airway & Oxygen Therapy: Patient Spontanous Breathing and Patient connected to face mask oxygen  Post-op Assessment: Report given to RN and Post -op Vital signs reviewed and stable  Post vital signs: Reviewed and stable  Last Vitals:  Vitals Value Taken Time  BP 128/51 11/26/20 1520  Temp    Pulse 65 11/26/20 1522  Resp 7 11/26/20 1523  SpO2 100 % 11/26/20 1522  Vitals shown include unvalidated device data.  Last Pain:  Vitals:   11/26/20 1049  TempSrc:   PainSc: 0-No pain      Patients Stated Pain Goal: 0 (18/84/16 6063)  Complications: No complications documented.

## 2020-11-26 NOTE — Progress Notes (Signed)
PT Cancellation Note  Pt unavailable/at procedure. Will check back as schedule allow. Thanks.    [facility]  Office: [phone] Pager: [phone]

## 2020-11-26 NOTE — Addendum Note (Signed)
Addendum  created 11/26/20 1646 by Milford Cage, CRNA   Charge Capture section accepted

## 2020-11-26 NOTE — Interval H&P Note (Signed)
History and Physical Interval Note:  11/26/2020 11:32 AM  [facility]  has presented today for surgery, with the diagnosis of right hydronephrosis.  The various methods of treatment have been discussed with the patient and family. After consideration of risks, benefits and other options for treatment, the patient has consented to  Procedure(s): CYSTOSCOPY WITH RETROGRADE PYELOGRAM, URETEROSCOPY AND STENT PLACEMENT (Right) as a surgical intervention.  The patient's history has been reviewed, patient examined, no change in status, stable for surgery.  I have reviewed the patient's chart and labs.  Questions were answered to the patient's satisfaction.     Remi Haggard

## 2020-11-26 NOTE — Progress Notes (Addendum)
PROGRESS NOTE    [name]  XBJ:[id number] DOB: [birth year] DOA: 11/22/2020  PCP: Vicenta Aly, FNP    Brief Narrative: 87 year old female with history of CVA, CAD, diabetes, hyperlipidemia, hypertension, bilateral nephrolithiasis with mild right hydroureteronephrosis -due to urothelial neoplasm or distal ureteral calculi based on Jan 25 imaging, chronic bladder outlet obstruction with large diverticulum in the bladder fundus, was brought in due to progressive generalized weakness.  Concern was for recurrent UTI.  Patient was hospitalized.   And subsequently developed hematuria.  Urology is following.  Subjective: Patient daughter at bedside.  Daughter feels that the patient has shown some improvement in her mentation compared to how she was at the time of admission.  Difficulty communicating with patient due to her dementia as well as hearing impairment.  However she denies any pain.  Does not appear to be in any discomfort. Discussed with nursing staff.  Patient continues to have bloody urine.  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   Patient admitted with recurrent UTI.  Was placed on antibiotics Patient subsequently developed hematuria.  Urology was consulted. Plan is for inpatient cystoscopy and possible stent placement today. Urine culture from urology office 1/31 and 10/25/20 no growth on cath urine sample, had ESBL E coli from pcp office from voided urine (less likely accurate). Patient currently on ceftriaxone.  Urine culture from this hospitalization without any growth.  Hematuria See above.    Debility, with multiple comorbidities PT and OT evaluation.  SNF is recommended.  Will need to be  reevaluated.  Patient lives with her daughter at home when she would like to home with possible.  History of dementia/acute metabolic encephalopathy Acute confusion likely due to UTI.  Seems to have improved per daughter.    Severe hearing impairment She is also legally blind.  Supportive care.    Severe aortic stenosis/moderate MR/severe LVH/grade 2 diastolic dysfunction  As per daughter not a candidate for surgical intervention while in [place]. Echocardiogram was ordered on admission that showed EF 65 to 70% no regional wall motion abnormalities, severe LVH grade 2 diastolic dysfunction, moderate pericardial effusion no evidence of cardiac tamponade, moderate MR, severe aortic valve stenosis. Seems to be stable from cardiac standpoint.  DM2 Monitor CBGs.  Continue SSI.  HbA1c 7.5.    Hypokalemia Potassium level noted to be 3.3 today.  Will supplement further.    Normocytic anemia/acute blood loss anemia Hemoglobin noted to be slightly lower today compared to yesterday.  Likely due to hematuria.  Continue to monitor.  Transfuse if it drops below 7.  Recheck hemoglobin later today.  Anemia panel reviewed.  No clear-cut deficiencies identified.  Essential hypertension Lasix on hold currently.  Blood pressure is reasonably well controlled.  History of CAD Stable.  Plavix on hold.  Hx of CVA Stable.  Plavix on hold  DVT prophylaxis: SCD's Code Status:   Code Status: Partial Code Family Communication: Daughter at bedside Disposition: Return home versus SNF.  Status is: Remains inpatient Patient remains hospitalized for ongoing management of hematuria, UTI Dispo: The patient is from: Home  W/ Daughter              Anticipated d/c is to: Home              Anticipated  d/c date is:1- 2 days              Patient currently is not medically stable to d/c.   Difficult to place patient No  Consultants: Urology  Procedures:  Cystoscopy and possible stent placement planned for  2/7  Culture/Microbiology    Component Value Date/Time   SDES  11/22/2020 1603    URINE, CLEAN CATCH Performed at [facility], [place] [address] [place], [place] [address]    SPECREQUEST  11/22/2020 1603    NONE Performed at [facility], [place] [address], [place], [place] [address]    CULT  11/22/2020 1603    NO GROWTH Performed at [place] [address], [place], [place] [address]    REPTSTATUS 11/24/2020 FINAL 11/22/2020 1603      Medications: Scheduled Meds: . [MAR Hold] citalopram  20 mg Oral QHS  . [MAR Hold] feeding supplement  237 mL Oral BID BM  . [MAR Hold] insulin aspart  0-9 Units Subcutaneous Q4H  . [MAR Hold] mouth rinse  15 mL Mouth Rinse BID   Continuous Infusions: . sodium chloride 75 mL/hr at 11/26/20 0344  . [MAR Hold] cefTRIAXone (ROCEPHIN)  IV Stopped (11/25/20 1748)    Antimicrobials: Anti-infectives (From admission, onward)   Start     Dose/Rate Route Frequency Ordered Stop   11/25/20 1800  [MAR Hold]  cefTRIAXone (ROCEPHIN) 1 g in sodium chloride 0.9 % 100 mL IVPB        (MAR Hold since Mon 11/26/2020 at 1017.Hold Reason: Transfer to a Procedural area.)   1 g 200 mL/hr over 30 Minutes Intravenous Every 24 hours 11/25/20 1240     11/23/20 1800  cefTRIAXone (ROCEPHIN) 1 g in sodium chloride 0.9 % 100 mL IVPB  Status:  Discontinued        1 g 200 mL/hr over 30 Minutes Intravenous Every 24 hours 11/23/20 1159 11/25/20 1240   11/22/20 1845  cefTRIAXone (ROCEPHIN) 1 g in sodium chloride 0.9 % 100 mL IVPB        1 g 200 mL/hr over 30 Minutes Intravenous  Once 11/22/20 1841 11/22/20 1927     Objective: Vitals: Today's Vitals   11/25/20 2027 11/26/20 0426 11/26/20 1027 11/26/20 1049  BP: 132/63 135/67 133/61   Pulse: 69 64 62   Resp: 20 20 16    Temp: 98.8 F (37.1 C) 98.7 F (37.1 C) 98.2 F (36.8 C)   TempSrc: Oral Oral Oral   SpO2: 97% 97% 95%   Weight:      Height:      PainSc:    0-No pain     Intake/Output Summary (Last 24 hours) at 11/26/2020 1052 Last data filed at 11/26/2020 0500 Gross per 24 hour  Intake 1409.73 ml  Output 1450 ml  Net -40.27 ml   Filed Weights   11/23/20 1627  Weight: 48.4 kg   Weight change:   Intake/Output from previous day: 02/06 0701 - 02/07 0700 In: 2492.2 [P.O.:480; I.V.:1912.2; IV Piggyback:100] Out: 2250 [Urine:2250] Intake/Output this shift: No intake/output data recorded. Filed Weights   11/23/20 1627  Weight: 48.4 kg    Examination:  General appearance: No discomfort noted.  Responds to certain questions and follows commands. Resp: Clear to auscultation bilaterally.  Normal effort Cardio: S1-S2 is normal regular.  No S3-S4.  No rubs murmurs or bruit GI: Abdomen is soft.  Nontender nondistended.  Bowel sounds are present normal.  No masses organomegaly Extremities: No edema.  Able to move both of her lower extremities. Neurologic: No focal neurological deficits.       Data Reviewed: I have personally reviewed following labs and imaging studies CBC: Recent Labs  Lab 11/22/20 1524 11/22/20 1542 11/23/20 0643 11/24/20 0649 11/25/20 0558 11/25/20 1357 11/26/20 0450  WBC 7.2  --  6.5 4.9 6.7  --  7.8  NEUTROABS 4.3  --  3.7  --   --   --   --   HGB 11.7*   < > 10.4* 14.2 8.9* 9.5* 8.0*  HCT 36.9   < > 32.5* 43.4 28.0* 30.1* 26.0*  MCV 92.9  --  91.3 89.9 91.5  --  95.6  PLT 332  --  303 223 298  --  295   < > = values in this interval not displayed.   Basic Metabolic Panel: Recent Labs  Lab 11/22/20 1524 11/22/20 1542 11/23/20 0643 11/24/20 0649 11/25/20 0558 11/26/20 0450  NA 138 137 139 140 141 141  K 3.9 3.9 3.3* 3.3* 3.3* 4.1  CL 101 101 102 108 111 109  CO2 26  --  26 25 24 24   GLUCOSE 154* 148* 122* 101* 117* 133*  BUN 17 17 13 10 10 10   CREATININE 0.82 0.70 0.63 0.65 0.71 0.57  CALCIUM 10.5*  --  9.6 9.1 9.0 9.1  MG  --   --  1.9  1.9  --   --   --   PHOS  --   --  2.3*  2.3*  --   --   --     GFR: Estimated Creatinine Clearance: 36.4 mL/min (by C-G formula based on SCr of 0.57 mg/dL).  Liver Function Tests: Recent Labs  Lab 11/22/20 1524 11/23/20 0643  AST 10* 9*  ALT 9 8  ALKPHOS 44 37*  BILITOT 0.7 0.4  PROT 7.0 6.1*  ALBUMIN 3.0* 2.5*   Coagulation Profile: Recent Labs  Lab 11/22/20 1524  INR 1.1   Cardiac Enzymes: Recent Labs  Lab 11/23/20 0643  CKTOTAL 20*   CBG: Recent Labs  Lab 11/25/20 1547 11/25/20 2024 11/26/20 0015 11/26/20 0423 11/26/20 0731  GLUCAP 165* 187* 141* 124* 107*   Sepsis Labs: Recent Labs  Lab 11/22/20 1615  LATICACIDVEN 1.0    Recent Results (from the past 240 hour(s))  Urine Culture     Status: None   Collection Time: 11/22/20  4:03 PM   Specimen: Urine, Clean Catch  Result Value Ref Range Status   Specimen Description   Final    URINE, CLEAN CATCH Performed at [facility], [place] [address], [place], [place] [address]    Special Requests   Final    NONE Performed at [facility], [place] [address], [place], [place] [address]    Culture   Final    NO GROWTH Performed at [facility], [place] [address] [place], [place] [address]    Report Status 11/24/2020 FINAL  Final  SARS Coronavirus 2 by RT PCR (hospital order, performed in [facility] hospital lab) Nasopharyngeal Nasopharyngeal Swab     Status: None   Collection Time: 11/22/20  4:21 PM   Specimen: Nasopharyngeal Swab  Result Value Ref Range Status   SARS Coronavirus 2 NEGATIVE NEGATIVE Final    Comment: (NOTE) SARS-CoV-2 target nucleic acids are NOT DETECTED.  The SARS-CoV-2 RNA is generally detectable in upper and lower respiratory specimens during the acute phase of infection. The lowest concentration of SARS-CoV-2 viral  copies this assay can detect is 250 copies / mL. A negative result does not preclude SARS-CoV-2 infection and should not be used as the sole basis for treatment or other patient management  decisions.  A negative result may occur with improper specimen collection / handling, submission of specimen other than nasopharyngeal swab, presence of viral mutation(s) within the areas targeted by this assay, and inadequate number of viral copies (<250 copies / mL). A negative result must be combined with clinical observations, patient history, and epidemiological information.  Fact Sheet for Patients:   StrictlyIdeas.no  Fact Sheet for Healthcare Providers: BankingDealers.co.za  This test is not yet approved or  cleared by the [place] FDA and has been authorized for detection and/or diagnosis of SARS-CoV-2 by FDA under an Emergency Use Authorization (EUA).  This EUA will remain in effect (meaning this test can be used) for the duration of the COVID-19 declaration under Section 564(b)(1) of the Act, 21 U.S.C. section 360bbb-3(b)(1), unless the authorization is terminated or revoked sooner.  Performed at [facility], [place] [address] [place], [place] [address]   Surgical pcr screen     Status: None   Collection Time: 11/26/20 12:00 AM   Specimen: Nasal Mucosa; Nasal Swab  Result Value Ref Range Status   MRSA, PCR NEGATIVE NEGATIVE Final   Staphylococcus aureus NEGATIVE NEGATIVE Final    Comment: (NOTE) The Xpert SA Assay (FDA approved for NASAL specimens in patients 2 years of age and older), is one component of a comprehensive surveillance program. It is not intended to diagnose infection nor to guide or monitor treatment. Performed at [facility], [place] [address] [place], [place] [address]      Radiology Studies: No results found.   LOS: 3 days   Bonnielee Haff, MD [facility]  11/26/2020, 10:52 AM

## 2020-11-26 NOTE — Anesthesia Preprocedure Evaluation (Signed)
Anesthesia Evaluation  Patient identified by MRN, date of birth, ID band Patient awake    Reviewed: Allergy & Precautions, H&P , NPO status , Patient's Chart, lab work & pertinent test results  Airway Mallampati: II  TM Distance: >3 FB Neck ROM: Full    Dental no notable dental hx.    Pulmonary neg pulmonary ROS,    Pulmonary exam normal breath sounds clear to auscultation       Cardiovascular hypertension, Pt. on medications + CAD  Normal cardiovascular exam Rhythm:Regular Rate:Normal     Neuro/Psych Dementia CVA negative psych ROS   GI/Hepatic negative GI ROS, Neg liver ROS,   Endo/Other  negative endocrine ROSdiabetes  Renal/GU negative Renal ROS  negative genitourinary   Musculoskeletal negative musculoskeletal ROS (+)   Abdominal   Peds negative pediatric ROS (+)  Hematology negative hematology ROS (+)   Anesthesia Other Findings   Reproductive/Obstetrics negative OB ROS                             Anesthesia Physical Anesthesia Plan  ASA: III  Anesthesia Plan: General   Post-op Pain Management:    Induction: Intravenous  PONV Risk Score and Plan: 3 and Ondansetron, Dexamethasone, Midazolam and Treatment may vary due to age or medical condition  Airway Management Planned: LMA  Additional Equipment:   Intra-op Plan:   Post-operative Plan: Extubation in OR  Informed Consent: I have reviewed the patients History and Physical, chart, labs and discussed the procedure including the risks, benefits and alternatives for the proposed anesthesia with the patient or authorized representative who has indicated his/her understanding and acceptance.     Dental advisory given  Plan Discussed with: CRNA  Anesthesia Plan Comments:         Anesthesia Quick Evaluation

## 2020-11-26 NOTE — Anesthesia Postprocedure Evaluation (Signed)
Anesthesia Post Note  Patient: [employer]  Procedure(s) Performed: CYSTOSCOPY WITH RIGHT RETROGRADE PYELOGRAM, RIGHT URETEROSCOPY AND PYELOSCOPY, RIGHT STENT PLACEMENT, FULGERATION OF RIGHT URETER AND BLADDER (Right Ureter)     Patient location during evaluation: PACU Anesthesia Type: General Level of consciousness: awake and alert Pain management: pain level controlled Vital Signs Assessment: post-procedure vital signs reviewed and stable Respiratory status: spontaneous breathing, nonlabored ventilation and respiratory function stable Cardiovascular status: blood pressure returned to baseline and stable Postop Assessment: no apparent nausea or vomiting Anesthetic complications: no   No complications documented.  Last Vitals:  Vitals:   11/26/20 1600 11/26/20 1616  BP: (!) 112/50 (!) 124/55  Pulse: 73 70  Resp: 13 18  Temp:  (!) 36.4 C  SpO2: 99% 97%    Last Pain:  Vitals:   11/26/20 1616  TempSrc: Oral  PainSc:                  Lynda Rainwater

## 2020-11-26 NOTE — Anesthesia Procedure Notes (Signed)
Procedure Name: LMA Insertion Date/Time: 11/26/2020 11:49 AM Performed by: [place] Kemonie Cutillo C, CRNA Pre-anesthesia Checklist: Patient identified, Emergency Drugs available, Suction available and Patient being monitored Patient Re-evaluated:Patient Re-evaluated prior to induction Oxygen Delivery Method: Circle system utilized Preoxygenation: Pre-oxygenation with 100% oxygen Induction Type: IV induction Ventilation: Mask ventilation without difficulty LMA: LMA inserted LMA Size: 4.0 Number of attempts: 1 Airway Equipment and Method: Bite block Placement Confirmation: positive ETCO2 Tube secured with: Tape Dental Injury: Teeth and Oropharynx as per pre-operative assessment

## 2020-11-26 NOTE — Op Note (Signed)
Operative report Preop diagnosis: Right hydronephrosis and gross hematuria Postop diagnosis: Same Procedure: Cystoscopy, right retrograde pyelogram with intraoperative interpretation, right ureteroscopy and pyeloscopy, insertion of right JJ stent, fulguration of distal right ureter and bladder Anesthesia is general Estimated blood loss: 100 cc Operative findings: Patient had significant bladder clot which was evacuated.  Had what appeared to be small arterial bleeder just inside the right patulous ureteral orifice.  This was fulgurated with Bugbee electrode.  Ureteroscopy and pyeloscopy were normal.  Patient has significant hydroureteronephrosis secondary to patulous ureteral orifice and likely reflux.  Also has significant friable bladder mucosa which required extensive clot evacuation and fulguration.  No obvious tumor noted.  6 [place] by 24 cm right JJ stent placed.  11 French three-way Foley placed at termination of the case with light pink effluent. Operative note: After obtaining informed consent for the patient she was taken to the major cystoscopy suite placed under general anesthesia.  Placed in the dorsolithotomy position genitalia prepped in usual sterile fashion.  Proper pause and timeout was performed.  The 21 French cystoscope was advanced in the bladder.  This was filled with some fairly thick tenacious clot which required extensive irrigation to remove the clot.  Also had to use rigid biopsy forceps to help remove the clot.  After removal of the clot the bladder was inspected revealed a large superior base bladder diverticulum there were no obvious bladder mucosal lesions noted but there was significant friable tissue and thinning of the bladder mucosa with some oozing after removal of the bladder clot.  The bleeding appeared to be coming from the right ureteral orifice was somewhat pulsatile blood noted.  I subsequently attempted to retrograde but the ureteral orifice was somewhat patulous  and therefore the x-ray contrast would not get retrograde.  I passed a open tip catheter in retrograde fashion up to the renal pelvis and performed retrograde pyelogram confirming significant hydroureteronephrosis but no evidence of filling defect.  This emptied out promptly upon removal of the retrograde catheter.  A guidewire subsequently passed up to the renal pelvis and coiled.  6.4 French semirigid ureteroscope was advanced over the guidewire and passed inside the right ureteral orifice and advanced proximally.  There was some what appeared to be small pulsatile bleeding coming from just inside the right ureteral orifice from some some what shaggy appearing mucosa.  I passed the scope retrograde all the way up to the renal pelvis and saw no lesions.  Subsequently passed an access catheter into the ureter and flexible scope was passed all the way to the kidney and no evidence of bleeding or lesions noted within the renal pelvis or calyces.  Ureteroscope and access sheath were removed.  The bleeding again appeared to be coming from just inside the distal ureteral orifice.  I utilized the small Bugbee electrode to fulgurate this tissue through the rigid semirigid ureteroscope.  This seemed to slow things down significantly.  At this point however the patient was forming very thick clot on the lining of the bladder as the mucosa was oozing and it made difficult visualization within the bladder.  I felt the stent was indicated.  Wire was again passed up the renal pelvis and a 6 [place] by 24 cm Percuflex plus soft Contour stent was placed up to the renal pelvis with good flow of urine through and around the stent noted.  There was still some fairly bloody urine coming around the stent and I repeated ureteroscopy distally and again fulgurated several  spots and this seemed to slow things down significantly.  After inspecting and the bladder for better part of 45 minutes with removal of several of the clots I did not  see any active bleeding.  24 French three-way Foley was placed to CBI irrigation with light pink effluent noted.  Procedure was terminated she was awakened from anesthesia and taken back to the recovery room stable condition.  No immediate complication from the procedure.

## 2020-11-27 ENCOUNTER — Encounter (HOSPITAL_COMMUNITY): Payer: Self-pay | Admitting: Urology

## 2020-11-27 ENCOUNTER — Encounter (HOSPITAL_COMMUNITY): Admission: RE | Payer: Self-pay | Source: Home / Self Care

## 2020-11-27 ENCOUNTER — Ambulatory Visit (HOSPITAL_COMMUNITY): Admission: RE | Admit: 2020-11-27 | Payer: Medicare Other | Source: Home / Self Care | Admitting: Urology

## 2020-11-27 DIAGNOSIS — D62 Acute posthemorrhagic anemia: Secondary | ICD-10-CM

## 2020-11-27 LAB — BASIC METABOLIC PANEL
Anion gap: 7 (ref 5–15)
BUN: 9 mg/dL (ref 8–23)
CO2: 24 mmol/L (ref 22–32)
Calcium: 8.8 mg/dL — ABNORMAL LOW (ref 8.9–10.3)
Chloride: 107 mmol/L (ref 98–111)
Creatinine, Ser: 0.59 mg/dL (ref 0.44–1.00)
GFR, Estimated: >60 mL/min (ref >60)
Glucose, Bld: 115 mg/dL — ABNORMAL HIGH (ref 70–99)
Potassium: 3.7 mmol/L (ref 3.5–5.1)
Sodium: 138 mmol/L (ref 135–145)

## 2020-11-27 LAB — GLUCOSE, CAPILLARY
Glucose-Capillary: 121 mg/dL — ABNORMAL HIGH (ref 70–99)
Glucose-Capillary: 135 mg/dL — ABNORMAL HIGH (ref 70–99)
Glucose-Capillary: 150 mg/dL — ABNORMAL HIGH (ref 70–99)
Glucose-Capillary: 156 mg/dL — ABNORMAL HIGH (ref 70–99)
Glucose-Capillary: 185 mg/dL — ABNORMAL HIGH (ref 70–99)
Glucose-Capillary: 93 mg/dL (ref 70–99)
Glucose-Capillary: 99 mg/dL (ref 70–99)

## 2020-11-27 LAB — PREPARE RBC (CROSSMATCH)

## 2020-11-27 LAB — CBC
HCT: 24 % — ABNORMAL LOW (ref 36.0–46.0)
Hemoglobin: 7.4 g/dL — ABNORMAL LOW (ref 12.0–15.0)
MCH: 29.7 pg (ref 26.0–34.0)
MCHC: 30.8 g/dL (ref 30.0–36.0)
MCV: 96.4 fL (ref 80.0–100.0)
Platelets: 310 10*3/uL (ref 150–400)
RBC: 2.49 MIL/uL — ABNORMAL LOW (ref 3.87–5.11)
RDW: 14 % (ref 11.5–15.5)
WBC: 10.3 10*3/uL (ref 4.0–10.5)
nRBC: 0 % (ref 0.0–0.2)

## 2020-11-27 SURGERY — CYSTOURETEROSCOPY, WITH RETROGRADE PYELOGRAM AND STENT INSERTION
Anesthesia: General | Laterality: Bilateral

## 2020-11-27 MED ORDER — SODIUM CHLORIDE 0.9% IV SOLUTION: Freq: Once | INTRAVENOUS | Status: AC

## 2020-11-27 NOTE — Progress Notes (Signed)
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  Concern was for recurrent UTI.  Patient was hospitalized.   And subsequently developed hematuria.  Urology is following.  Subjective: Patient remains poorly communicative due to her dementia as well as hearing impairment.  She denies any pain.  Does not appear to be in any discomfort.    Assessment & Plan:  Acute recurrent UTI/Chronic bladder outlet obstruction with large diverticulum/hematuria Patient has a large diverticulum in the bladder fundus and bilateral nephrolithiasis with mild right hydroureteronephrosis, for which she was being followed by urology in the outpatient setting.   Patient admitted with recurrent UTI.  Was placed on antibiotics Patient subsequently developed hematuria.  Urology was consulted. Patient underwent cystoscopy and stent placement on 2/7.  Urine appears to be clearing up.  Not as bloody as yesterday. Urine culture from urology office 1/31 and 10/25/20 no growth on cath urine sample, had ESBL E coli from pcp office from voided urine (less likely accurate). Patient currently on ceftriaxone.  Urine culture from this hospitalization without any growth.  Hematuria See above.    Normocytic anemia/acute blood loss anemia Hemoglobin has been trending down.  Likely due to her persistent hematuria.  She appears to be more fatigued today.  Blood transfusion discussed with daughter who is agreeable.  Will order 1 unit of PRBC.  Recheck hemoglobin tomorrow.   Anemia panel reviewed.   No clear-cut deficiencies identified.  Debility, with multiple comorbidities Daughter would prefer to take her home with home health.  Will have PT and OT reevaluate.    History of dementia/acute metabolic encephalopathy Acute confusion likely due to UTI.  Seems to have improved.  Severe hearing impairment She is also legally blind.  Supportive care.    Severe aortic stenosis/moderate MR/severe LVH/grade 2 diastolic dysfunction  As per daughter not a candidate for surgical intervention while in [place]. Echocardiogram was ordered on admission that showed EF 65 to 70% no regional wall motion abnormalities, severe LVH grade 2 diastolic dysfunction, moderate pericardial effusion no evidence of cardiac tamponade, moderate MR, severe aortic valve stenosis. Seems to be stable from cardiac standpoint.  DM2 Monitor CBGs.  Continue SSI.  HbA1c 7.5.    Hypokalemia Improved.  Essential hypertension Lasix on hold currently.  Blood pressure is reasonably well controlled.  History of CAD Stable.  Plavix on hold.  Hx of CVA Stable.  Plavix on hold.  Should be able to resume once hematuria subsides and hemoglobin remains stable.  DVT prophylaxis: SCD's Code Status:   Code Status: Partial Code Family Communication: Discussed with daughter over the phone. Disposition: Return home versus SNF.  Status is: Remains inpatient Patient remains hospitalized for ongoing management of hematuria, UTI Dispo: The patient is from: Home  W/ Daughter              Anticipated d/c is to: Home              Anticipated d/c date is:1- 2 days              Patient currently is not medically stable to d/c.   Difficult to place patient  No  Consultants: Urology  Procedures:  Procedure: Cystoscopy, right retrograde pyelogram with intraoperative interpretation, right ureteroscopy and pyeloscopy, insertion of right JJ stent, fulguration of distal right ureter and bladder  Culture/Microbiology    Component Value  Date/Time   SDES  11/22/2020 1603    URINE, CLEAN CATCH Performed at [facility], [place] [address] [place], [place] [address]    SPECREQUEST  11/22/2020 1603    NONE Performed at [facility], [place] [address], [place], [place] [address]    CULT  11/22/2020 1603    NO GROWTH Performed at [facility] [address], [place], [place] [address]    REPTSTATUS 11/24/2020 FINAL 11/22/2020 1603      Medications: Scheduled Meds: . Chlorhexidine Gluconate Cloth  6 each Topical Daily  . citalopram  20 mg Oral QHS  . feeding supplement  237 mL Oral BID BM  . insulin aspart  0-9 Units Subcutaneous Q4H  . mouth rinse  15 mL Mouth Rinse BID   Continuous Infusions: . sodium chloride 75 mL/hr at 11/27/20 0855  . cefTRIAXone (ROCEPHIN)  IV 1 g (11/26/20 1850)  . sodium chloride irrigation      Antimicrobials: Anti-infectives (From admission, onward)   Start     Dose/Rate Route Frequency Ordered Stop   11/25/20 1800  cefTRIAXone (ROCEPHIN) 1 g in sodium chloride 0.9 % 100 mL IVPB        1 g 200 mL/hr over 30 Minutes Intravenous Every 24 hours 11/25/20 1240     11/23/20 1800  cefTRIAXone (ROCEPHIN) 1 g in sodium chloride 0.9 % 100 mL IVPB  Status:  Discontinued        1 g 200 mL/hr over 30 Minutes Intravenous Every 24 hours 11/23/20 1159 11/25/20 1240   11/22/20 1845  cefTRIAXone (ROCEPHIN) 1 g in sodium chloride 0.9 % 100 mL IVPB        1 g 200 mL/hr over 30 Minutes Intravenous  Once 11/22/20 1841 11/22/20 1927     Objective: Vitals: Today's Vitals   11/26/20 1616 11/26/20 2056 11/27/20 0001 11/27/20 0402  BP: (!) 124/55 122/60 116/78 (!) 125/57  Pulse: 70 68 69 67  Resp: 18 16 16 12   Temp: (!) 97.5 F (36.4 C) 98.3 F (36.8 C) 98 F (36.7 C) 98.8 F (37.1 C)  TempSrc: Oral Oral Oral Oral  SpO2: 97% 99% 99% 98%  Weight:      Height:      PainSc:        Intake/Output Summary (Last 24 hours) at 11/27/2020 1022 Last data filed at  11/27/2020 0856 Gross per 24 hour  Intake 3926.65 ml  Output 3650 ml  Net 276.65 ml   Filed Weights   11/23/20 1627  Weight: 48.4 kg   Weight change:   Intake/Output from previous day: 02/07 0701 - 02/08 0700 In: 3926.7 [P.O.:120; I.V.:2106.7; IV Piggyback:100] Out: 2650 [Urine:2650] Intake/Output this shift: Total I/O In: -  Out: 1000 [Urine:1000] Filed Weights   11/23/20 1627  Weight: 48.4 kg    Examination:  General appearance: Does not appear to be in any discomfort. Resp: Clear to auscultation bilaterally.  Normal effort Cardio: S1-S2 is normal regular.  Systolic murmur appreciated over the precordium  GI: Abdomen is soft.  Nontender nondistended.  Bowel sounds are present normal.  No masses organomegaly Extremities: No edema.   Neurologic:  No focal neurological deficits.       Data Reviewed: I have personally reviewed following  labs and imaging studies CBC: Recent Labs  Lab 11/22/20 1524 11/22/20 1542 11/23/20 0643 11/24/20 0649 11/25/20 0558 11/25/20 1357 11/26/20 0450 11/26/20 1544 11/27/20 0459  WBC 7.2  --  6.5 4.9 6.7  --  7.8  --  10.3  NEUTROABS 4.3  --  3.7  --   --   --   --   --   --   HGB 11.7*   < > 10.4* 14.2 8.9* 9.5* 8.0* 9.0* 7.4*  HCT 36.9   < > 32.5* 43.4 28.0* 30.1* 26.0* 29.1* 24.0*  MCV 92.9  --  91.3 89.9 91.5  --  95.6  --  96.4  PLT 332  --  303 223 298  --  295  --  310   < > = values in this interval not displayed.   Basic Metabolic Panel: Recent Labs  Lab 11/23/20 0643 11/24/20 0649 11/25/20 0558 11/26/20 0450 11/27/20 0459  NA 139 140 141 141 138  K 3.3* 3.3* 3.3* 4.1 3.7  CL 102 108 111 109 107  CO2 26 25 24 24 24   GLUCOSE 122* 101* 117* 133* 115*  BUN 13 10 10 10 9   CREATININE 0.63 0.65 0.71 0.57 0.59  CALCIUM 9.6 9.1 9.0 9.1 8.8*  MG 1.9  1.9  --   --   --   --   PHOS 2.3*  2.3*  --   --   --   --    GFR: Estimated Creatinine Clearance: 36.4 mL/min (by C-G formula based on SCr of 0.59 mg/dL).  Liver  Function Tests: Recent Labs  Lab 11/22/20 1524 11/23/20 0643  AST 10* 9*  ALT 9 8  ALKPHOS 44 37*  BILITOT 0.7 0.4  PROT 7.0 6.1*  ALBUMIN 3.0* 2.5*   Coagulation Profile: Recent Labs  Lab 11/22/20 1524  INR 1.1   Cardiac Enzymes: Recent Labs  Lab 11/23/20 0643  CKTOTAL 20*   CBG: Recent Labs  Lab 11/26/20 1624 11/26/20 2039 11/27/20 0000 11/27/20 0359 11/27/20 0750  GLUCAP 144* 227* 150* 121* 93   Sepsis Labs: Recent Labs  Lab 11/22/20 1615  LATICACIDVEN 1.0    Recent Results (from the past 240 hour(s))  Urine Culture     Status: None   Collection Time: 11/22/20  4:03 PM   Specimen: Urine, Clean Catch  Result Value Ref Range Status   Specimen Description   Final    URINE, CLEAN CATCH Performed at [facility], [place] [address] [place], [place] [address]    Special Requests   Final    NONE Performed at [facility], [place] [address], [place], [place] [address]    Culture   Final    NO GROWTH Performed at [facility], [place] [address] [place], [place] [address]    Report Status 11/24/2020 FINAL  Final  SARS Coronavirus 2 by RT PCR (hospital order, performed in [facility] hospital lab) Nasopharyngeal Nasopharyngeal Swab     Status: None   Collection Time: 11/22/20  4:21 PM   Specimen: Nasopharyngeal Swab  Result Value Ref Range Status   SARS Coronavirus 2 NEGATIVE NEGATIVE Final    Comment: (NOTE) SARS-CoV-2 target nucleic acids are NOT DETECTED.  The SARS-CoV-2 RNA is generally detectable in upper and lower respiratory specimens during the acute phase of infection. The lowest concentration of SARS-CoV-2 viral copies this assay can detect is 250 copies / mL. A negative result does not preclude SARS-CoV-2 infection and should  not be used as the sole basis for treatment or other patient management decisions.  A negative result may occur with improper specimen collection / handling, submission of specimen  other than nasopharyngeal swab, presence of viral mutation(s) within the areas targeted by this assay, and inadequate number of viral copies (<250 copies / mL). A negative result must be combined with clinical observations, patient history, and epidemiological information.  Fact Sheet for Patients:   StrictlyIdeas.no  Fact Sheet for Healthcare Providers: BankingDealers.co.za  This test is not yet approved or  cleared by the [place] FDA and has been authorized for detection and/or diagnosis of SARS-CoV-2 by FDA under an Emergency Use Authorization (EUA).  This EUA will remain in effect (meaning this test can be used) for the duration of the COVID-19 declaration under Section 564(b)(1) of the Act, 21 U.S.C. section 360bbb-3(b)(1), unless the authorization is terminated or revoked sooner.  Performed at [facility], [place] [address], [place], [place] [address]   Surgical pcr screen     Status: None   Collection Time: 11/26/20 12:00 AM   Specimen: Nasal Mucosa; Nasal Swab  Result Value Ref Range Status   MRSA, PCR NEGATIVE NEGATIVE Final   Staphylococcus aureus NEGATIVE NEGATIVE Final    Comment: (NOTE) The Xpert SA Assay (FDA approved for NASAL specimens in patients 1 years of age and older), is one component of a comprehensive surveillance program. It is not intended to diagnose infection nor to guide or monitor treatment. Performed at [facility], [place] [address], [place], [place] [address]      Radiology Studies: DG C-Arm 1-60 Min-No Report  Result Date: 11/26/2020 Fluoroscopy was utilized by the requesting physician.  No radiographic interpretation.     LOS: 4 days   Bonnielee Haff, MD [facility]  11/27/2020, 10:22 AM

## 2020-11-27 NOTE — Progress Notes (Signed)
1 Day Post-Op Subjective: Patient feeling well today no complaint of pain Foley in place draining clear urine on minimal CBI.  Hemoglobin 8.0 today.  Objective: Vital signs in last 24 hours: Temp:  [96.7 F (35.9 C)-98.8 F (37.1 C)] 97.7 F (36.5 C) (02/08 1150) Pulse Rate:  [63-73] 72 (02/08 1150) Resp:  [7-18] 16 (02/08 1150) BP: (112-135)/(50-78) 135/51 (02/08 1150) SpO2:  [94 %-100 %] 94 % (02/08 1150)  Intake/Output from previous day: 02/07 0701 - 02/08 0700 In: 3926.7 [P.O.:120; I.V.:2106.7; IV Piggyback:100] Out: 2650 [Urine:2650] Intake/Output this shift: Total I/O In: 240 [P.O.:240] Out: 1000 [Urine:1000]  Physical Exam:  General: Alert and oriented CV: RRR Lungs: Clear Abdomen: Soft, ND Incisions: Ext: NT, No erythema  Lab Results: Recent Labs    11/26/20 0450 11/26/20 1544 11/27/20 0459  HGB 8.0* 9.0* 7.4*  HCT 26.0* 29.1* 24.0*   BMET Recent Labs    11/26/20 0450 11/27/20 0459  NA 141 138  K 4.1 3.7  CL 109 107  CO2 24 24  GLUCOSE 133* 115*  BUN 10 9  CREATININE 0.57 0.59  CALCIUM 9.1 8.8*     Studies/Results: DG C-Arm 1-60 Min-No Report  Result Date: 11/26/2020 Fluoroscopy was utilized by the requesting physician.  No radiographic interpretation.    Assessment/Plan: Gross hematuria, status post fulguration of right distal ureter and bladder on 11/26/2020, hematuria resolved. Plan/recommendation Will DC CBI.  Keep Foley today, hopeful DC Foley tomorrow.    LOS: 4 days   Remi Haggard 11/27/2020, 12:23 PM

## 2020-11-27 NOTE — TOC Progression Note (Signed)
Transition of Care [facility]) - Progression Note    Patient Details  Name: Rebecca Pruitt MRN: [id number] Date of Birth: 1929/07/13  [facility]) CM/SW Contact  Donnell Wion, [place], [place] Phone Number: 11/27/2020, 3:16 PM  Clinical Narrative:     CSW was informed that patient lives with her daughter who helps to take care of her and the plan is to return back home with daughter and home health services.  CSW reviewed patient's chart, and patient is currently active with [facility] for home health RN.  CSW attempted to call patient's daughter to confirm that she wants to continue to use [facility].  CSW had to leave a message awaiting for a call back from daughter.   Expected Discharge Plan: [place]    Expected Discharge Plan and Services Expected Discharge Plan: [place]   Discharge Planning Services: CM Consult   Living arrangements for the past 2 months: Single Family Home                                       Social Determinants of Health (SDOH) Interventions    Readmission Risk Interventions No flowsheet data found.

## 2020-11-27 NOTE — Care Management Important Message (Signed)
Important Message  Patient Details IM Letter given to the Patient. Name: Rebecca Pruitt MRN: [id number] Date of Birth: 1929-03-30   Medicare Important Message Given:  Yes     Kerin Salen 11/27/2020, 10:13 AM

## 2020-11-27 NOTE — Progress Notes (Signed)
PT Cancellation Note  Patient Details Name: Rebecca Pruitt MRN: [id number] DOB: [birth year]   Cancelled Treatment:     checked on pt this morning.  Currently on CBI.  Pt has been evaluated with rec for SNF.  Will attempt to mobilize tomorrow.   Nathanial Rancher 11/27/2020, 3:15 PM

## 2020-11-28 LAB — CBC
HCT: 29.6 % — ABNORMAL LOW (ref 36.0–46.0)
Hemoglobin: 9.4 g/dL — ABNORMAL LOW (ref 12.0–15.0)
MCH: 30.1 pg (ref 26.0–34.0)
MCHC: 31.8 g/dL (ref 30.0–36.0)
MCV: 94.9 fL (ref 80.0–100.0)
Platelets: 329 10*3/uL (ref 150–400)
RBC: 3.12 MIL/uL — ABNORMAL LOW (ref 3.87–5.11)
RDW: 14.6 % (ref 11.5–15.5)
WBC: 9.2 10*3/uL (ref 4.0–10.5)
nRBC: 0 % (ref 0.0–0.2)

## 2020-11-28 LAB — BASIC METABOLIC PANEL
Anion gap: 9 (ref 5–15)
BUN: 9 mg/dL (ref 8–23)
CO2: 22 mmol/L (ref 22–32)
Calcium: 8.9 mg/dL (ref 8.9–10.3)
Chloride: 109 mmol/L (ref 98–111)
Creatinine, Ser: 0.54 mg/dL (ref 0.44–1.00)
GFR, Estimated: >60 mL/min (ref >60)
Glucose, Bld: 179 mg/dL — ABNORMAL HIGH (ref 70–99)
Potassium: 3.4 mmol/L — ABNORMAL LOW (ref 3.5–5.1)
Sodium: 140 mmol/L (ref 135–145)

## 2020-11-28 LAB — TYPE AND SCREEN
ABO/RH(D): O NEG
Antibody Screen: NEGATIVE
Unit division: 0

## 2020-11-28 LAB — BPAM RBC
Blood Product Expiration Date: 202202212359
ISSUE DATE / TIME: 202202081207
Unit Type and Rh: 9500

## 2020-11-28 LAB — GLUCOSE, CAPILLARY
Glucose-Capillary: 129 mg/dL — ABNORMAL HIGH (ref 70–99)
Glucose-Capillary: 135 mg/dL — ABNORMAL HIGH (ref 70–99)
Glucose-Capillary: 135 mg/dL — ABNORMAL HIGH (ref 70–99)
Glucose-Capillary: 148 mg/dL — ABNORMAL HIGH (ref 70–99)
Glucose-Capillary: 162 mg/dL — ABNORMAL HIGH (ref 70–99)
Glucose-Capillary: 204 mg/dL — ABNORMAL HIGH (ref 70–99)
Glucose-Capillary: 227 mg/dL — ABNORMAL HIGH (ref 70–99)

## 2020-11-28 LAB — MAGNESIUM: Magnesium: 1.8 mg/dL (ref 1.7–2.4)

## 2020-11-28 MED ORDER — POTASSIUM CHLORIDE 20 MEQ PO PACK
40.0000 meq | PACK | Freq: Once | ORAL | Status: AC
  Administered 2020-11-28: 40 meq via ORAL
  Filled 2020-11-28: qty 2

## 2020-11-28 NOTE — Progress Notes (Signed)
2 Days Post-Op Subjective: Patient alert and conversive today.  Not complaining of any pain.  Urine remains clear off CBI  Objective: Vital signs in last 24 hours: Temp:  [97.8 F (36.6 C)-100.8 F (38.2 C)] 97.8 F (36.6 C) (02/09 1239) Pulse Rate:  [77-98] 78 (02/09 1239) Resp:  [15-20] 20 (02/09 1238) BP: (121-153)/(54-74) 153/69 (02/09 1238) SpO2:  [92 %-96 %] 96 % (02/09 1239)  Intake/Output from previous day: 02/08 0701 - 02/09 0700 In: 5073.5 [P.O.:1320; I.V.:1173.5; Blood:380; IV Piggyback:100] Out: 5670 [Urine:3325] Intake/Output this shift: Total I/O In: 240 [P.O.:240] Out: 650 [Urine:650]  Physical Exam:  General: Alert and oriented   Lab Results: Recent Labs    11/26/20 1544 11/27/20 0459 11/28/20 0518  HGB 9.0* 7.4* 9.4*  HCT 29.1* 24.0* 29.6*   BMET Recent Labs    11/27/20 0459 11/28/20 0518  NA 138 140  K 3.7 3.4*  CL 107 109  CO2 24 22  GLUCOSE 115* 179*  BUN 9 9  CREATININE 0.59 0.54  CALCIUM 8.8* 8.9     Studies/Results: DG C-Arm 1-60 Min-No Report  Result Date: 11/26/2020 Fluoroscopy was utilized by the requesting physician.  No radiographic interpretation.    Assessment/Plan: Gross hematuria, resolved status post recent fulguration of bladder and distal ureter. Plan recommendation: We will discontinue Foley early a.m. tomorrow for voiding trial.  We will see if hematuria returns after DC of the Foley before initiation of anticoagulation again. Discussed plan with daughter today.  Will need to have stent removed in the office in 1 to 2 weeks.    LOS: 5 days   Remi Haggard 11/28/2020, 1:27 PM

## 2020-11-28 NOTE — Plan of Care (Signed)

## 2020-11-28 NOTE — Progress Notes (Signed)
Physical Therapy Treatment Patient Details Name: Rebecca Pruitt MRN: [id number] DOB: 04/06/1929 Today's Date: 11/28/2020    History of Present Illness 87 y.o. female with medical history significant of CVA  CAD, DM, HLD, HTN presenting with progressive generalized weakness usually able to ambulate with walker however having increased difficulty.    PT Comments    Pt was OOB in recliner via OT.  Daughter in room.  Applied pt shoes as daughter stated pt will doe better.  Assisted out of recliner back to bed was difficult.  General transfer comment: greatest assist was needed for sit to stand present with severe posterior lean and no self assist from B UE's.  Pt was able to stand and support her weight but still required increased assist to weight shift and take a few functional steps to complete 1/4 turn to bed. General bed mobility comments: severe posterior LOB required Min Assist to maintain seated balance EOB then required MAX Assist to transfer to supine.  Kyphotic posture and limited functional use B UE's R>L  Positioned in bed with multiple pillows. Daughter plans to tale pt back home home.  Daughter IS physically able to transfer and provide 24/7 care.  Pt may need PTAR transport as a car transfer will most likely be very difficult.  TBD  Follow Up Recommendations  SNF (daughter plans to take pt back home)     Equipment Recommendations  None recommended by PT  Has a hospital bed, walker, shower seat and BSC   Recommendations for Other Services       Precautions / Restrictions Precautions Precautions: Fall Precaution Comments: does better with shoes Restrictions Weight Bearing Restrictions: No    Mobility  Bed Mobility Overal bed mobility: Needs Assistance Bed Mobility: Sit to Supine     Supine to sit: Max assist;HOB elevated Sit to supine: Max assist   General bed mobility comments: severe posterior LOB required Min Assist to maintain seated balance EOB then required  MAX Assist to transfer to supine.  Kyphotic posture and limited functional use B UE's R>L  Transfers Overall transfer level: Needs assistance Equipment used: Rolling walker (2 wheeled) Transfers: Sit to/from [facility] Sit to Stand: Total assist;Max assist Stand pivot transfers: Max assist       General transfer comment: greatest assist was needed for sit to stand present with severe posterior lean and no self assist from B UE's.  Pt was able to stand and support her weight but still required increased assist to weight shift and take a few functional steps to complete 1/4 turn to bed.  Ambulation/Gait             General Gait Details: transfers only due to efoort, weakness and balance   Stairs             Wheelchair Mobility    Modified Rankin (Stroke Patients Only)       Balance Overall balance assessment: Needs assistance Sitting-balance support: Bilateral upper extremity supported;Feet supported Sitting balance-Leahy Scale: Poor Sitting balance - Comments: No loss of balance. But poor posture. Maintained upright position predominantly by having elbows on her knees. Postural control: Posterior lean Standing balance support: Bilateral upper extremity supported;During functional activity Standing balance-Leahy Scale: Poor Standing balance comment: initial posterior lean with standing- with external assistance able to get her over her feet and with hands on walker.  Cognition Arousal/Alertness: Awake/alert Behavior During Therapy: WFL for tasks assessed/performed Overall Cognitive Status: Within Functional Limits for tasks assessed Area of Impairment: Orientation;Memory;Following commands;Problem solving                 Orientation Level: Disoriented to;Place;Time;Situation   Memory: Decreased short-term memory Following Commands: Follows one step commands with increased time     Problem Solving:  Slow processing;Decreased initiation;Requires verbal cues;Requires tactile cues General Comments: AxO x 2 improving following all commands and conversive      Exercises      General Comments        Pertinent Vitals/Pain Pain Assessment: No/denies pain    Home Living                      Prior Function            PT Goals (current goals can now be found in the care plan section) Progress towards PT goals: Progressing toward goals    Frequency    Min 3X/week      PT Plan Current plan remains appropriate    Co-evaluation              AM-PAC PT "6 Clicks" Mobility   Outcome Measure  Help needed turning from your back to your side while in a flat bed without using bedrails?: Total Help needed moving from lying on your back to sitting on the side of a flat bed without using bedrails?: Total Help needed moving to and from a bed to a chair (including a wheelchair)?: Total Help needed standing up from a chair using your arms (e.g., wheelchair or bedside chair)?: Total Help needed to walk in hospital room?: Total Help needed climbing 3-5 steps with a railing? : Total 6 Click Score: 6    End of Session Equipment Utilized During Treatment: Gait belt Activity Tolerance: Patient limited by fatigue Patient left: in bed;with call bell/phone within reach Nurse Communication: Mobility status PT Visit Diagnosis: Unsteadiness on feet (R26.81);Muscle weakness (generalized) (M62.81);Difficulty in walking, not elsewhere classified (R26.2)     Time: 8563-1497 PT Time Calculation (min) (ACUTE ONLY): 24 min  Charges:  $Therapeutic Activity: 23-37 mins                     Rica Koyanagi  PTA Acute  Rehabilitation Services Pager      [phone] Office      [phone]

## 2020-11-28 NOTE — Progress Notes (Signed)
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  Concern was for recurrent UTI.  Patient was hospitalized.   And subsequently developed hematuria.  Urology is following. Patient underwent cystoscopy stent placement.  Subjective: Patient noted to be eating breakfast with assistance this morning. Reports good appetite. Denies any pain. History remains limited due to her dementia and hearing impairment.    Assessment & Plan:  Acute recurrent UTI/Chronic bladder outlet obstruction with large diverticulum/hematuria Patient has a large diverticulum in the bladder fundus and bilateral nephrolithiasis with mild right hydroureteronephrosis, for which she was being followed by urology in the outpatient setting.   Patient admitted with recurrent UTI.  Was placed on antibiotics Patient subsequently developed hematuria.  Urology was consulted. Patient underwent cystoscopy and stent placement on 2/7. Urine appears to be clearing. Per urology note yesterday plan might be to remove the Foley catheter today. Await further input. Urine culture from urology office 1/31 and 10/25/20 no growth on cath urine sample, had ESBL E coli from pcp office from voided urine (less likely accurate). Patient currently on ceftriaxone.  Urine culture from this hospitalization without any growth. Today is day 7 of antibiotics. Low-grade fever noted last night. Continue antibiotics for now. Will discuss with urology as to how long to continue.  Hematuria See above.    Normocytic  anemia/acute blood loss anemia Hemoglobin had been trending down.  Likely due to her persistent hematuria. She appeared to be more fatigued yesterday. She was transfused 1 unit of PRBC. Improvement in hemoglobin noted. Improvement in fatigue also noted.   Anemia panel reviewed.  No clear-cut deficiencies identified.  Debility, with multiple comorbidities Daughter would prefer to take her home with home health.  Will have PT and OT reevaluate.    History of dementia/acute metabolic encephalopathy Acute confusion likely due to UTI.  Seems to have improved.  Severe hearing impairment She is also legally blind.  Supportive care.    Severe aortic stenosis/moderate MR/severe LVH/grade 2 diastolic dysfunction  As per daughter not a candidate for surgical intervention while in [place]. Echocardiogram was ordered on admission that showed EF 65 to 70% no regional wall motion abnormalities, severe LVH grade 2 diastolic dysfunction, moderate pericardial effusion no evidence of cardiac tamponade, moderate MR, severe aortic valve stenosis. Seems to be stable from cardiac standpoint.  DM2 Monitor CBGs.  Continue SSI.  HbA1c 7.5.    Hypokalemia We will replete. Check magnesium.  Essential hypertension Lasix on hold currently.  Blood pressure is reasonably well controlled.  History of CAD Stable.  Plavix on hold.  Hx of CVA Stable.  Plavix on hold.  Should be able to resume once hematuria subsides and hemoglobin remains stable.  DVT prophylaxis: SCD's Code Status:   Code Status: Partial Code Family Communication: Daughter being updated daily. Disposition: Return home versus SNF.  Status is: Remains inpatient Patient remains hospitalized for ongoing management of hematuria, UTI Dispo: The patient is from: Home  W/ Daughter              Anticipated d/c is to: Home  Anticipated d/c date is:1- 2 days              Patient currently is not medically stable to d/c.   Difficult to place  patient No  Consultants: Urology  Procedures:  Procedure: Cystoscopy, right retrograde pyelogram with intraoperative interpretation, right ureteroscopy and pyeloscopy, insertion of right JJ stent, fulguration of distal right ureter and bladder  Culture/Microbiology    Component Value Date/Time   SDES  11/22/2020 1603    URINE, CLEAN CATCH Performed at [facility], [place] [address], [place], [place] [address]    SPECREQUEST  11/22/2020 1603    NONE Performed at [facility], [place] [address], [place], [place] [address]    CULT  11/22/2020 1603    NO GROWTH Performed at [place] [address], [place], [place] [address]    REPTSTATUS 11/24/2020 FINAL 11/22/2020 1603      Medications: Scheduled Meds: . Chlorhexidine Gluconate Cloth  6 each Topical Daily  . citalopram  20 mg Oral QHS  . feeding supplement  237 mL Oral BID BM  . insulin aspart  0-9 Units Subcutaneous Q4H  . mouth rinse  15 mL Mouth Rinse BID   Continuous Infusions: . sodium chloride 75 mL/hr at 11/28/20 0223  . cefTRIAXone (ROCEPHIN)  IV 1 g (11/27/20 1720)  . sodium chloride irrigation      Antimicrobials: Anti-infectives (From admission, onward)   Start     Dose/Rate Route Frequency Ordered Stop   11/25/20 1800  cefTRIAXone (ROCEPHIN) 1 g in sodium chloride 0.9 % 100 mL IVPB        1 g 200 mL/hr over 30 Minutes Intravenous Every 24 hours 11/25/20 1240     11/23/20 1800  cefTRIAXone (ROCEPHIN) 1 g in sodium chloride 0.9 % 100 mL IVPB  Status:  Discontinued        1 g 200 mL/hr over 30 Minutes Intravenous Every 24 hours 11/23/20 1159 11/25/20 1240   11/22/20 1845  cefTRIAXone (ROCEPHIN) 1 g in sodium chloride 0.9 % 100 mL IVPB        1 g 200 mL/hr over 30 Minutes Intravenous  Once 11/22/20 1841 11/22/20 1927     Objective: Vitals: Today's Vitals   11/27/20 1526 11/27/20 2036 11/28/20 0447 11/28/20 0830  BP: (!) 121/54 129/69 137/74   Pulse: 78 98 86    Resp: 18 18 15    Temp: 98.9 F (37.2 C) (!) 100.8 F (38.2 C) 99.1 F (37.3 C)   TempSrc: Oral Oral Oral   SpO2: 94% 92% 92%   Weight:      Height:      PainSc:    0-No pain    Intake/Output Summary (Last 24 hours) at 11/28/2020 1131 Last data filed at 11/28/2020 1043 Gross per 24 hour  Intake 5073.53 ml  Output 2975 ml  Net 2098.53 ml   Filed Weights   11/23/20 1627  Weight: 48.4 kg   Weight change:   Intake/Output from previous day: 02/08 0701 - 02/09 0700 In: 5073.5 [P.O.:1320; I.V.:1173.5; Blood:380; IV Piggyback:100] Out: 9233 [Urine:3325] Intake/Output this shift: Total I/O In: 240 [P.O.:240] Out: 650 [Urine:650] Filed Weights   11/23/20 1627  Weight: 48.4 kg    Examination:  General appearance: Awake alert. Eating breakfast. In no distress Resp: Clear to auscultation bilaterally.  Normal effort Cardio: S1-S2 is normal regular.  No S3-S4.  No rubs murmurs or bruit GI: Abdomen is soft.  Nontender nondistended.  Bowel sounds  are present normal.  No masses organomegaly Extremities: No edema.   Neurologic:   No focal neurological deficits.       Data Reviewed: I have personally reviewed following labs and imaging studies CBC: Recent Labs  Lab 11/22/20 1524 11/22/20 1542 11/23/20 0643 11/24/20 0649 11/25/20 0558 11/25/20 1357 11/26/20 0450 11/26/20 1544 11/27/20 0459 11/28/20 0518  WBC 7.2  --  6.5 4.9 6.7  --  7.8  --  10.3 9.2  NEUTROABS 4.3  --  3.7  --   --   --   --   --   --   --   HGB 11.7*   < > 10.4* 14.2 8.9* 9.5* 8.0* 9.0* 7.4* 9.4*  HCT 36.9   < > 32.5* 43.4 28.0* 30.1* 26.0* 29.1* 24.0* 29.6*  MCV 92.9  --  91.3 89.9 91.5  --  95.6  --  96.4 94.9  PLT 332  --  303 223 298  --  295  --  310 329   < > = values in this interval not displayed.   Basic Metabolic Panel: Recent Labs  Lab 11/23/20 0643 11/24/20 0649 11/25/20 0558 11/26/20 0450 11/27/20 0459 11/28/20 0518  NA 139 140 141 141 138 140  K 3.3* 3.3* 3.3* 4.1 3.7 3.4*   CL 102 108 111 109 107 109  CO2 26 25 24 24 24 22   GLUCOSE 122* 101* 117* 133* 115* 179*  BUN 13 10 10 10 9 9   CREATININE 0.63 0.65 0.71 0.57 0.59 0.54  CALCIUM 9.6 9.1 9.0 9.1 8.8* 8.9  MG 1.9  1.9  --   --   --   --   --   PHOS 2.3*  2.3*  --   --   --   --   --    GFR: Estimated Creatinine Clearance: 36.4 mL/min (by C-G formula based on SCr of 0.54 mg/dL).  Liver Function Tests: Recent Labs  Lab 11/22/20 1524 11/23/20 0643  AST 10* 9*  ALT 9 8  ALKPHOS 44 37*  BILITOT 0.7 0.4  PROT 7.0 6.1*  ALBUMIN 3.0* 2.5*   Coagulation Profile: Recent Labs  Lab 11/22/20 1524  INR 1.1   Cardiac Enzymes: Recent Labs  Lab 11/23/20 0643  CKTOTAL 20*   CBG: Recent Labs  Lab 11/27/20 1701 11/27/20 2037 11/28/20 0007 11/28/20 0444 11/28/20 0740  GLUCAP 156* 185* 148* 162* 135*   Sepsis Labs: Recent Labs  Lab 11/22/20 1615  LATICACIDVEN 1.0    Recent Results (from the past 240 hour(s))  Urine Culture     Status: None   Collection Time: 11/22/20  4:03 PM   Specimen: Urine, Clean Catch  Result Value Ref Range Status   Specimen Description   Final    URINE, CLEAN CATCH Performed at [facility], [place] [address] [place], [place] [address]    Special Requests   Final    NONE Performed at [facility], [place] [address], [place], [place] [address]    Culture   Final    NO GROWTH Performed at [facility], [place] [address] [place], [place] [address]    Report Status 11/24/2020 FINAL  Final  SARS Coronavirus 2 by RT PCR (hospital order, performed in [facility] hospital lab) Nasopharyngeal Nasopharyngeal Swab     Status: None   Collection Time: 11/22/20  4:21 PM   Specimen: Nasopharyngeal Swab  Result Value Ref Range Status   SARS Coronavirus 2 NEGATIVE NEGATIVE  Final    Comment: (NOTE) SARS-CoV-2 target nucleic acids are NOT DETECTED.  The SARS-CoV-2 RNA is generally detectable in upper and lower respiratory specimens  during the acute phase of infection. The lowest concentration of SARS-CoV-2 viral copies this assay can detect is 250 copies / mL. A negative result does not preclude SARS-CoV-2 infection and should not be used as the sole basis for treatment or other patient management decisions.  A negative result may occur with improper specimen collection / handling, submission of specimen other than nasopharyngeal swab, presence of viral mutation(s) within the areas targeted by this assay, and inadequate number of viral copies (<250 copies / mL). A negative result must be combined with clinical observations, patient history, and epidemiological information.  Fact Sheet for Patients:   StrictlyIdeas.no  Fact Sheet for Healthcare Providers: BankingDealers.co.za  This test is not yet approved or  cleared by the [place] FDA and has been authorized for detection and/or diagnosis of SARS-CoV-2 by FDA under an Emergency Use Authorization (EUA).  This EUA will remain in effect (meaning this test can be used) for the duration of the COVID-19 declaration under Section 564(b)(1) of the Act, 21 U.S.C. section 360bbb-3(b)(1), unless the authorization is terminated or revoked sooner.  Performed at [facility], [place] [address] [place], [place] [address]   Surgical pcr screen     Status: None   Collection Time: 11/26/20 12:00 AM   Specimen: Nasal Mucosa; Nasal Swab  Result Value Ref Range Status   MRSA, PCR NEGATIVE NEGATIVE Final   Staphylococcus aureus NEGATIVE NEGATIVE Final    Comment: (NOTE) The Xpert SA Assay (FDA approved for NASAL specimens in patients 33 years of age and older), is one component of a comprehensive surveillance program. It is not intended to diagnose infection nor to guide or monitor treatment. Performed at [facility], [place] [address], [place], [place] [address]      Radiology  Studies: DG C-Arm 1-60 Min-No Report  Result Date: 11/26/2020 Fluoroscopy was utilized by the requesting physician.  No radiographic interpretation.     LOS: 5 days   Bonnielee Haff, MD [facility]  11/28/2020, 11:31 AM

## 2020-11-28 NOTE — TOC Progression Note (Signed)
Transition of Care [facility]) - Progression Note    Patient Details  Name: Rebecca Pruitt MRN: [id number] Date of Birth: [birth year]  [facility] CM/SW Contact  Ross Ludwig, [place] Phone Number: 11/28/2020, 11:55 AM  Clinical Narrative:     CSW was informed by bedside nurse, that patient's daughter would like to have patient return back home with home health.   Expected Discharge Plan: [place]    Expected Discharge Plan and Services Expected Discharge Plan: [place]   Discharge Planning Services: CM Consult   Living arrangements for the past 2 months: Single Family Home                                       Social Determinants of Health (SDOH) Interventions    Readmission Risk Interventions No flowsheet data found.

## 2020-11-28 NOTE — Progress Notes (Signed)
Occupational Therapy Treatment Patient Details Name: Rebecca Pruitt MRN: [id number] DOB: [birth year] Today's Date: 11/28/2020    History of present illness 87 y.o. female with medical history significant of CVA  CAD, DM, HLD, HTN presenting with progressive generalized weakness usually able to ambulate with walker however having increased difficulty.   OT comments  Treatment focused on functional mobility and participation in self care tasks. Patient max assist to transfer to edge of bed - patient not using upper extremities to assist with transfer but did assist with LE movement. Patient exhibits poor posture - kyphotic, posterior pelvic tilt and propping elbows on knees but able to grossly maintain edge of bed balance. Patient mod assist to stand from high bed position and after therapist placed hands on walker. Initial posterior lean with standing resulting in needing to sit down. Second stand with better foot placement. Patient able to take 3 steps to recliner with walker and mod assist from therapist and assist from second helper to complete transfer. Patient able to participate in grooming tasks but with poor quality, difficulty motor planning oral care task and following commands. Patient limited by decreased ROM in MCPs but able to grossly hold onto walker. No evidence of skin care break down in palm of head - if continues to use walker -suspect patient won't need palm protectors as she has just enough ROM to keep fingers from digging in to palm. May benefit in the future however. Cont POC to improve patient's functional mobility and participation in order to return home with family   Follow Up Recommendations  Supervision/Assistance - 24 hour;SNF;Other (comment)    Equipment Recommendations  Other (comment);Wheelchair (measurements OT)    Recommendations for Other Services      Precautions / Restrictions Precautions Precautions: Fall Restrictions Weight Bearing Restrictions: No        Mobility Bed Mobility Overal bed mobility: Needs Assistance Bed Mobility: Supine to Sit     Supine to sit: Max assist;HOB elevated     General bed mobility comments: Max assis to transfer to side of bed - patient able to partially assist with LEs to move towards side of bed, needed assistance for trunk lift off and negotiation and use of bed pad to pivot to edge of bed. Patient not attempting to use UEs at all with transfer.  Transfers Overall transfer level: Needs assistance Equipment used: Rolling walker (2 wheeled) Transfers: Sit to/from [facility] Sit to Stand: Mod assist;From elevated surface Stand pivot transfers: Mod assist;+2 safety/equipment       General transfer comment: Mod assist from significantly elevated bed to stand after placing hands on walker. Needed to return to sitting due to patient's fear of falling. Stood again and patient able to take step to the left with mod assist. 2nd assistant needed to assist with finishing transfer to recliner.    Balance Overall balance assessment: Needs assistance Sitting-balance support: Bilateral upper extremity supported;Feet supported Sitting balance-Leahy Scale: Poor Sitting balance - Comments: No loss of balance. But poor posture. Maintained upright position predominantly by having elbows on her knees. Postural control: Posterior lean Standing balance support: Bilateral upper extremity supported;During functional activity Standing balance-Leahy Scale: Poor Standing balance comment: initial posterior lean with standing- with external assistance able to get her over her feet and with hands on walker.                           ADL either performed or assessed with clinical  judgement   ADL Overall ADL's : Needs assistance/impaired Eating/Feeding: Total assistance Eating/Feeding Details (indicate cue type and reason): total assist to drink from cup, unable to grasp cup. Grooming: Bed  level;Sitting;Maximal assistance Grooming Details (indicate cue type and reason): Patient able to wash bottom part of face with set up but with poor quality. Also to very briefly manuever tooth brush in mouth but not in a functional pattern. Difficulty with understanding commands to swish and spit as well.                                     Vision Baseline Vision/History: Legally blind Additional Comments: blind in right eye and legally blind in left.   Perception     Praxis      Cognition Arousal/Alertness: Awake/alert Behavior During Therapy: WFL for tasks assessed/performed Overall Cognitive Status: No family/caregiver present to determine baseline cognitive functioning Area of Impairment: Orientation;Memory;Following commands;Problem solving                 Orientation Level: Disoriented to;Place;Time;Situation   Memory: Decreased short-term memory Following Commands: Follows one step commands with increased time     Problem Solving: Slow processing;Decreased initiation;Requires verbal cues;Requires tactile cues          Exercises     Shoulder Instructions       General Comments      Pertinent Vitals/ Pain       Pain Assessment: No/denies pain  Home Living                                          Prior Functioning/Environment              Frequency  Min 2X/week        Progress Toward Goals  OT Goals(current goals can now be found in the care plan section)  Progress towards OT goals: Progressing toward goals  Acute Rehab OT Goals OT Goal Formulation: Patient unable to participate in goal setting Time For Goal Achievement: 12/07/20 Potential to Achieve Goals: [place] Discharge plan remains appropriate    Co-evaluation                 AM-PAC OT "6 Clicks" Daily Activity     Outcome Measure   Help from another person eating meals?: A Lot Help from another person taking care of personal  grooming?: A Lot Help from another person toileting, which includes using toliet, bedpan, or urinal?: Total Help from another person bathing (including washing, rinsing, drying)?: Total Help from another person to put on and taking off regular upper body clothing?: Total Help from another person to put on and taking off regular lower body clothing?: Total 6 Click Score: 8    End of Session Equipment Utilized During Treatment: Gait belt;Rolling walker  OT Visit Diagnosis: Unsteadiness on feet (R26.81);Other abnormalities of gait and mobility (R26.89);Muscle weakness (generalized) (M62.81);Other symptoms and signs involving cognitive function   Activity Tolerance     Patient Left in bed;with call bell/phone within reach;with chair alarm set   Nurse Communication Mobility status (patient reports she is hungry.)        Time: 1130-1154 OT Time Calculation (min): 24 min  Charges: OT General Charges $OT Visit: 1 Visit OT Treatments $Self Care/Home Management : 8-22 mins $Therapeutic Activity: 8-22  mins  Derl Barrow, OTR/L [place]  Office [phone] Pager: [place] 11/28/2020, 12:58 PM

## 2020-11-29 LAB — CBC
HCT: 28.9 % — ABNORMAL LOW (ref 36.0–46.0)
Hemoglobin: 9.4 g/dL — ABNORMAL LOW (ref 12.0–15.0)
MCH: 30.5 pg (ref 26.0–34.0)
MCHC: 32.5 g/dL (ref 30.0–36.0)
MCV: 93.8 fL (ref 80.0–100.0)
Platelets: 309 10*3/uL (ref 150–400)
RBC: 3.08 MIL/uL — ABNORMAL LOW (ref 3.87–5.11)
RDW: 14.7 % (ref 11.5–15.5)
WBC: 8.4 10*3/uL (ref 4.0–10.5)
nRBC: 0 % (ref 0.0–0.2)

## 2020-11-29 LAB — BASIC METABOLIC PANEL
Anion gap: 9 (ref 5–15)
BUN: 9 mg/dL (ref 8–23)
CO2: 24 mmol/L (ref 22–32)
Calcium: 9 mg/dL (ref 8.9–10.3)
Chloride: 106 mmol/L (ref 98–111)
Creatinine, Ser: 0.61 mg/dL (ref 0.44–1.00)
GFR, Estimated: >60 mL/min (ref >60)
Glucose, Bld: 114 mg/dL — ABNORMAL HIGH (ref 70–99)
Potassium: 3.5 mmol/L (ref 3.5–5.1)
Sodium: 139 mmol/L (ref 135–145)

## 2020-11-29 LAB — GLUCOSE, CAPILLARY
Glucose-Capillary: 109 mg/dL — ABNORMAL HIGH (ref 70–99)
Glucose-Capillary: 109 mg/dL — ABNORMAL HIGH (ref 70–99)
Glucose-Capillary: 122 mg/dL — ABNORMAL HIGH (ref 70–99)
Glucose-Capillary: 169 mg/dL — ABNORMAL HIGH (ref 70–99)
Glucose-Capillary: 190 mg/dL — ABNORMAL HIGH (ref 70–99)
Glucose-Capillary: 200 mg/dL — ABNORMAL HIGH (ref 70–99)

## 2020-11-29 MED ORDER — POTASSIUM CHLORIDE 20 MEQ PO PACK
40.0000 meq | PACK | Freq: Once | ORAL | Status: AC
  Administered 2020-11-29: 40 meq via ORAL
  Filled 2020-11-29: qty 2

## 2020-11-29 NOTE — Progress Notes (Signed)
PROGRESS NOTE    [name]  YBO:[id number] DOB: [birth year] DOA: 11/22/2020  PCP: Vicenta Aly, FNP    Brief Narrative: 87 year old female with history of CVA, CAD, diabetes, hyperlipidemia, hypertension, bilateral nephrolithiasis with mild right hydroureteronephrosis -due to urothelial neoplasm or distal ureteral calculi based on Jan 25 imaging, chronic bladder outlet obstruction with large diverticulum in the bladder fundus, was brought in due to progressive generalized weakness.  Concern was for recurrent UTI.  Patient was hospitalized.   And subsequently developed hematuria.  Urology is following. Patient underwent cystoscopy stent placement.  Subjective: Patient noted to be pleasantly confused.  Denies any pain.  Difficult to communicate with her due to her dementia and hearing impairment.     Assessment & Plan:  Acute recurrent UTI/Chronic bladder outlet obstruction with large diverticulum/hematuria Patient has a large diverticulum in the bladder fundus and bilateral nephrolithiasis with mild right hydroureteronephrosis, for which she was being followed by urology in the outpatient setting.   Patient admitted with recurrent UTI.  Was placed on antibiotics Patient subsequently developed hematuria.  Urology was consulted. Patient underwent cystoscopy and stent placement on 2/7.  Urine culture from urology office 1/31 and 10/25/20 no growth on cath urine sample, had ESBL E coli from pcp office from voided urine (less likely accurate). Patient currently on ceftriaxone.  Urine culture from this hospitalization without any growth. Today is day 8 of antibiotics.  He was noted on the night of 2/8.  No further fever noted.  Remains on ceftriaxone. Urine appears to be clearing up.  Looks like Foley catheter was discontinued this morning.  Await further urology input.    Hematuria See above.  Seems to be resolving.  Normocytic anemia/acute blood loss anemia Likely due to combination  of dilution as well as hematuria.  Patient was transfused with improvement in hemoglobin.  Anemia panel reviewed.  No clear-cut deficiencies identified.  Debility, with multiple comorbidities Daughter would prefer to take her home with home health.  Being followed by PT and OT.  History of dementia/acute metabolic encephalopathy Acute confusion likely due to UTI.  Seems to have improved.  Severe hearing impairment She is also legally blind.  Supportive care.    Severe aortic stenosis/moderate MR/severe LVH/grade 2 diastolic dysfunction  As per daughter not a candidate for surgical intervention while in [place]. Echocardiogram was ordered on admission that showed EF 65 to 70% no regional wall motion abnormalities, severe LVH grade 2 diastolic dysfunction, moderate pericardial effusion no evidence of cardiac tamponade, moderate MR, severe aortic valve stenosis. Seems to be stable from cardiac standpoint.  DM2 CBGs are stable.  Continue SSI.  HbA1c 7.5.    Hypokalemia Magnesium 1.8.  Potassium to be supplemented again today.  Essential hypertension Lasix on hold currently.  Blood pressure is reasonably well controlled.  History of CAD Stable.  Plavix on hold.  Hx of CVA Stable.  Plavix on hold.  Resume Plavix when cleared to do so by urology.  DVT prophylaxis: SCD's Code Status:   Code Status: Partial Code Family Communication: Daughter being updated daily. Disposition: Patient prefers to take her home.  Status is: Remains inpatient Patient remains hospitalized for ongoing management of hematuria, UTI Dispo: The patient is from: Home  W/ Daughter              Anticipated d/c is to: Home              Anticipated d/c date is:1- 2 days  Patient currently is not medically stable to d/c.   Difficult to place patient No  Consultants: Urology  Procedures:  Procedure: Cystoscopy, right retrograde pyelogram with intraoperative interpretation, right ureteroscopy and  pyeloscopy, insertion of right JJ stent, fulguration of distal right ureter and bladder  Culture/Microbiology    Component Value Date/Time   SDES  11/22/2020 1603    URINE, CLEAN CATCH Performed at [facility], [place] [address] [place], [place] [address]    SPECREQUEST  11/22/2020 1603    NONE Performed at [facility], [place] [address], [place], [place] [address]    CULT  11/22/2020 1603    NO GROWTH Performed at [place] [address], [place], [place] [address]    REPTSTATUS 11/24/2020 FINAL 11/22/2020 1603      Medications: Scheduled Meds: . citalopram  20 mg Oral QHS  . feeding supplement  237 mL Oral BID BM  . insulin aspart  0-9 Units Subcutaneous Q4H  . mouth rinse  15 mL Mouth Rinse BID   Continuous Infusions: . sodium chloride 30 mL/hr at 11/28/20 1844  . cefTRIAXone (ROCEPHIN)  IV 1 g (11/28/20 1845)  . sodium chloride irrigation      Antimicrobials: Anti-infectives (From admission, onward)   Start     Dose/Rate Route Frequency Ordered Stop   11/25/20 1800  cefTRIAXone (ROCEPHIN) 1 g in sodium chloride 0.9 % 100 mL IVPB        1 g 200 mL/hr over 30 Minutes Intravenous Every 24 hours 11/25/20 1240     11/23/20 1800  cefTRIAXone (ROCEPHIN) 1 g in sodium chloride 0.9 % 100 mL IVPB  Status:  Discontinued        1 g 200 mL/hr over 30 Minutes Intravenous Every 24 hours 11/23/20 1159 11/25/20 1240   11/22/20 1845  cefTRIAXone (ROCEPHIN) 1 g in sodium chloride 0.9 % 100 mL IVPB        1 g 200 mL/hr over 30 Minutes Intravenous  Once 11/22/20 1841 11/22/20 1927     Objective: Vitals: Today's Vitals   11/28/20 1238 11/28/20 1239 11/28/20 2016 11/29/20 0436  BP: (!) 153/69  (!) 160/71 (!) 162/76  Pulse: 77 78 87 88  Resp: 20  18 18   Temp: 98.3 F (36.8 C) 97.8 F (36.6 C) 98.2 F (36.8 C) 97.9 F (36.6 C)  TempSrc: Oral Oral Oral   SpO2: 96% 96% 92% 94%  Weight:      Height:      PainSc:         Intake/Output Summary (Last 24 hours) at 11/29/2020 1209 Last data filed at 11/29/2020 0900 Gross per 24 hour  Intake 1310.76 ml  Output 1575 ml  Net -264.24 ml   Filed Weights   11/23/20 1627  Weight: 48.4 kg   Weight change:   Intake/Output from previous day: 02/09 0701 - 02/10 0700 In: 1490.8 [P.O.:960; I.V.:530.8] Out: 2225 [Urine:2225] Intake/Output this shift: Total I/O In: 60 [P.O.:60] Out: -  Filed Weights   11/23/20 1627  Weight: 48.4 kg    Examination:  General appearance: Awake alert.  Pleasantly confused. Resp: Clear to auscultation bilaterally.  Normal effort Cardio: S1-S2 is normal regular.  No S3-S4.  No rubs murmurs or bruit GI: Abdomen is soft.  Nontender nondistended.  Bowel sounds are present normal.  No masses organomegaly Extremities: No edema.   Neurologic:   No focal neurological deficits.       Data Reviewed: I have personally reviewed  following labs and imaging studies CBC: Recent Labs  Lab 11/22/20 1524 11/22/20 1542 11/23/20 0643 11/24/20 0649 11/25/20 0558 11/25/20 1357 11/26/20 0450 11/26/20 1544 11/27/20 0459 11/28/20 0518 11/29/20 0537  WBC 7.2  --  6.5   < > 6.7  --  7.8  --  10.3 9.2 8.4  NEUTROABS 4.3  --  3.7  --   --   --   --   --   --   --   --   HGB 11.7*   < > 10.4*   < > 8.9*   < > 8.0* 9.0* 7.4* 9.4* 9.4*  HCT 36.9   < > 32.5*   < > 28.0*   < > 26.0* 29.1* 24.0* 29.6* 28.9*  MCV 92.9  --  91.3   < > 91.5  --  95.6  --  96.4 94.9 93.8  PLT 332  --  303   < > 298  --  295  --  310 329 309   < > = values in this interval not displayed.   Basic Metabolic Panel: Recent Labs  Lab 11/23/20 0643 11/24/20 0649 11/25/20 0558 11/26/20 0450 11/27/20 0459 11/28/20 0518 11/28/20 1215 11/29/20 0537  NA 139   < > 141 141 138 140  --  139  K 3.3*   < > 3.3* 4.1 3.7 3.4*  --  3.5  CL 102   < > 111 109 107 109  --  106  CO2 26   < > 24 24 24 22   --  24  GLUCOSE 122*   < > 117* 133* 115* 179*  --  114*  BUN 13   <  > 10 10 9 9   --  9  CREATININE 0.63   < > 0.71 0.57 0.59 0.54  --  0.61  CALCIUM 9.6   < > 9.0 9.1 8.8* 8.9  --  9.0  MG 1.9  1.9  --   --   --   --   --  1.8  --   PHOS 2.3*  2.3*  --   --   --   --   --   --   --    < > = values in this interval not displayed.   GFR: Estimated Creatinine Clearance: 36.4 mL/min (by C-G formula based on SCr of 0.61 mg/dL).  Liver Function Tests: Recent Labs  Lab 11/22/20 1524 11/23/20 0643  AST 10* 9*  ALT 9 8  ALKPHOS 44 37*  BILITOT 0.7 0.4  PROT 7.0 6.1*  ALBUMIN 3.0* 2.5*   Coagulation Profile: Recent Labs  Lab 11/22/20 1524  INR 1.1   Cardiac Enzymes: Recent Labs  Lab 11/23/20 0643  CKTOTAL 20*   CBG: Recent Labs  Lab 11/28/20 2014 11/28/20 2342 11/29/20 0434 11/29/20 0731 11/29/20 1128  GLUCAP 204* 129* 109* 109* 122*   Sepsis Labs: Recent Labs  Lab 11/22/20 1615  LATICACIDVEN 1.0    Recent Results (from the past 240 hour(s))  Urine Culture     Status: None   Collection Time: 11/22/20  4:03 PM   Specimen: Urine, Clean Catch  Result Value Ref Range Status   Specimen Description   Final    URINE, CLEAN CATCH Performed at [facility], [place] [address], [place], [place] [address]    Special Requests   Final    NONE Performed at [facility], [place] [address], [place], [place] [address]  Culture   Final    NO GROWTH Performed at [facility], [place] [address], [place], [place] [address]    Report Status 11/24/2020 FINAL  Final  SARS Coronavirus 2 by RT PCR (hospital order, performed in [facility] hospital lab) Nasopharyngeal Nasopharyngeal Swab     Status: None   Collection Time: 11/22/20  4:21 PM   Specimen: Nasopharyngeal Swab  Result Value Ref Range Status   SARS Coronavirus 2 NEGATIVE NEGATIVE Final    Comment: (NOTE) SARS-CoV-2 target nucleic acids are NOT DETECTED.  The SARS-CoV-2 RNA is generally detectable in upper and lower respiratory specimens during  the acute phase of infection. The lowest concentration of SARS-CoV-2 viral copies this assay can detect is 250 copies / mL. A negative result does not preclude SARS-CoV-2 infection and should not be used as the sole basis for treatment or other patient management decisions.  A negative result may occur with improper specimen collection / handling, submission of specimen other than nasopharyngeal swab, presence of viral mutation(s) within the areas targeted by this assay, and inadequate number of viral copies (<250 copies / mL). A negative result must be combined with clinical observations, patient history, and epidemiological information.  Fact Sheet for Patients:   StrictlyIdeas.no  Fact Sheet for Healthcare Providers: BankingDealers.co.za  This test is not yet approved or  cleared by the [place] FDA and has been authorized for detection and/or diagnosis of SARS-CoV-2 by FDA under an Emergency Use Authorization (EUA).  This EUA will remain in effect (meaning this test can be used) for the duration of the COVID-19 declaration under Section 564(b)(1) of the Act, 21 U.S.C. section 360bbb-3(b)(1), unless the authorization is terminated or revoked sooner.  Performed at [facility], [place] [address] [place], [place] [address]   Surgical pcr screen     Status: None   Collection Time: 11/26/20 12:00 AM   Specimen: Nasal Mucosa; Nasal Swab  Result Value Ref Range Status   MRSA, PCR NEGATIVE NEGATIVE Final   Staphylococcus aureus NEGATIVE NEGATIVE Final    Comment: (NOTE) The Xpert SA Assay (FDA approved for NASAL specimens in patients 49 years of age and older), is one component of a comprehensive surveillance program. It is not intended to diagnose infection nor to guide or monitor treatment. Performed at [facility], [place] [address], [place], [place] [address]      Radiology Studies: No  results found.   LOS: 6 days   Bonnielee Haff, MD [facility]  11/29/2020, 12:09 PM

## 2020-11-29 NOTE — Progress Notes (Signed)
3 Days Post-Op Subjective: Patient is alert and conversive, no pain.  Foley catheter is out and the suction drainage is clear urine.  Hemoglobin is 9.4  Objective: Vital signs in last 24 hours: Temp:  [97.9 F (36.6 C)-98.2 F (36.8 C)] 98.2 F (36.8 C) (02/10 1335) Pulse Rate:  [87-91] 91 (02/10 1335) Resp:  [18-19] 19 (02/10 1335) BP: (155-162)/(71-115) 155/115 (02/10 1335) SpO2:  [92 %-96 %] 96 % (02/10 1335)  Intake/Output from previous day: 02/09 0701 - 02/10 0700 In: 1490.8 [P.O.:960; I.V.:530.8] Out: 2225 [Urine:2225] Intake/Output this shift: Total I/O In: 180 [P.O.:180] Out: 700 [Urine:700]  Physical Exam:  General: Alert and oriented  Lab Results: Recent Labs    11/27/20 0459 11/28/20 0518 11/29/20 0537  HGB 7.4* 9.4* 9.4*  HCT 24.0* 29.6* 28.9*   BMET Recent Labs    11/28/20 0518 11/29/20 0537  NA 140 139  K 3.4* 3.5  CL 109 106  CO2 22 24  GLUCOSE 179* 114*  BUN 9 9  CREATININE 0.54 0.61  CALCIUM 8.9 9.0     Studies/Results: No results found.  Assessment/Plan: Recent gross hematuria status post fulguration of bladder and distal ureter, hematuria now resolved and Foley catheter out. Plan/recommendation.  From GU standpoint patient could be discharged home without Foley.  Will need to make follow-up appointment in about 10 days in our office for cystoscopy and stent removal.  Please let me know when patient to be discharged home. Discussed with daughter reinitiation of her anticoagulant on 12/03/2020 if no hematuria between now and then.    LOS: 6 days   Remi Haggard 11/29/2020, 3:40 PM

## 2020-11-30 LAB — CBC
HCT: 32.8 % — ABNORMAL LOW (ref 36.0–46.0)
Hemoglobin: 10.6 g/dL — ABNORMAL LOW (ref 12.0–15.0)
MCH: 30.2 pg (ref 26.0–34.0)
MCHC: 32.3 g/dL (ref 30.0–36.0)
MCV: 93.4 fL (ref 80.0–100.0)
Platelets: 287 10*3/uL (ref 150–400)
RBC: 3.51 MIL/uL — ABNORMAL LOW (ref 3.87–5.11)
RDW: 15.2 % (ref 11.5–15.5)
WBC: 9.5 10*3/uL (ref 4.0–10.5)
nRBC: 0 % (ref 0.0–0.2)

## 2020-11-30 LAB — BASIC METABOLIC PANEL
Anion gap: 8 (ref 5–15)
BUN: 13 mg/dL (ref 8–23)
CO2: 23 mmol/L (ref 22–32)
Calcium: 9.2 mg/dL (ref 8.9–10.3)
Chloride: 108 mmol/L (ref 98–111)
Creatinine, Ser: 0.79 mg/dL (ref 0.44–1.00)
GFR, Estimated: >60 mL/min (ref >60)
Glucose, Bld: 124 mg/dL — ABNORMAL HIGH (ref 70–99)
Potassium: 3.9 mmol/L (ref 3.5–5.1)
Sodium: 139 mmol/L (ref 135–145)

## 2020-11-30 LAB — GLUCOSE, CAPILLARY
Glucose-Capillary: 124 mg/dL — ABNORMAL HIGH (ref 70–99)
Glucose-Capillary: 128 mg/dL — ABNORMAL HIGH (ref 70–99)
Glucose-Capillary: 157 mg/dL — ABNORMAL HIGH (ref 70–99)

## 2020-11-30 MED ORDER — ASPIRIN 81 MG PO TABS: 81.0000 mg | ORAL_TABLET | Freq: Every day | ORAL | Status: DC

## 2020-11-30 MED ORDER — CLOPIDOGREL BISULFATE 75 MG PO TABS: 75.0000 mg | ORAL_TABLET | Freq: Every day | ORAL | Status: DC

## 2020-11-30 NOTE — Discharge Instructions (Signed)

## 2020-11-30 NOTE — Discharge Summary (Signed)
[facility]  Physician Discharge Summary   Patient ID: Rebecca Pruitt MRN: [id number] DOB/AGE: [birth year] 85 y.o.  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 Plavix to be resumed on 2/14 if no further hematuria.    Home Health: PT OT RN Equipment/Devices: None  CODE STATUS: Partial code  DISCHARGE CONDITION: fair  Diet recommendation: As tolerated  INITIAL HISTORY: 87 year old female with history of CVA, CAD, diabetes, hyperlipidemia, hypertension, bilateral nephrolithiasis with mild right hydroureteronephrosis -due to urothelial neoplasm or distal ureteral calculi based on Jan 25 imaging, chronic bladder outlet obstruction with large diverticulum in the bladder fundus, was brought in due to progressive generalized weakness.  Concern was for recurrent UTI.  Patient was hospitalized.   And subsequently developed hematuria.  Urology is following. Patient underwent cystoscopy stent placement.  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   Patient  admitted with recurrent UTI.  Was placed on antibiotics Patient subsequently developed hematuria.  Urology was consulted. Patient underwent cystoscopy and stent placement on 2/7.  Urine culture from urology office 1/31 and 10/25/20 no growth on cath urine sample, had ESBL E coli from pcp office from voided urine (less likely accurate). Patient currently on ceftriaxone.  Urine culture from this hospitalization without any growth. She has completed more than 7 days of antibiotics.  Will not be discharged on same.  No further hematuria noted.  Foley catheter was discontinued yesterday and the patient has been able to void. Outpatient follow-up with urology.  Hematuria See above.  Resolved.  Normocytic anemia/acute blood loss anemia Likely due to combination of dilution as well as hematuria.  Patient was transfused with improvement in hemoglobin.  Anemia panel reviewed.  No clear-cut deficiencies identified.  Debility, with multiple comorbidities Daughter would prefer to take her home with home health.    History of dementia/acute metabolic encephalopathy Acute confusion likely due to UTI.  Seems to have improved.  Severe hearing impairment She is also legally blind.  Supportive care.    Severe aortic stenosis/moderate MR/severe LVH/grade 2 diastolic dysfunction  As per daughter not a candidate for surgical intervention while in [place]. Echocardiogram was ordered on admission that showed EF 65 to 70% no regional wall motion abnormalities, severe LVH grade 2 diastolic dysfunction, moderate pericardial effusion no evidence of cardiac tamponade, moderate MR, severe aortic valve stenosis. Seems to be stable from cardiac standpoint.  DM2 HbA1c 7.5.    Hypokalemia Supplemented  Essential hypertension   History of CAD Plavix can be resumed on 2/14 per urology  Hx of CVA Stable.  Plavix can be resumed on 2/14 per urology  Stage I pressure ulcer Pressure Injury 11/23/20 Coccyx  Mid Stage 1 -  Intact skin with non-blanchable redness of a localized area usually over a bony prominence. pink/non blanchable (Active)  11/23/20 1640  Location: Coccyx  Location Orientation: Mid  Staging: Stage 1 -  Intact skin with non-blanchable redness of a localized area usually over a bony prominence.  Wound Description (Comments): pink/non blanchable  Present on Admission: Yes    Patient is stable.  Okay for discharge home today with daughter.  PERTINENT LABS:  The results of significant diagnostics from this hospitalization (including imaging, microbiology, ancillary and laboratory) are listed below for reference.    Microbiology: Recent Results (from the past 240 hour(s))  Urine Culture     Status: None   Collection Time: 11/22/20  4:03 PM   Specimen: Urine, Clean Catch  Result Value Ref Range Status   Specimen Description   Final    URINE, CLEAN CATCH Performed at [facility], [place] [address] [place], [place] [address]    Special Requests   Final    NONE Performed at [facility], [place] [address] [place], [place] [address]    Culture   Final    NO GROWTH Performed at [facility], [place] [address], [place] [address], [place] [address]    Report Status 11/24/2020 FINAL  Final  SARS Coronavirus 2 by RT PCR (hospital order, performed in [facility] hospital lab) Nasopharyngeal Nasopharyngeal Swab     Status: None   Collection Time: 11/22/20  4:21 PM   Specimen: Nasopharyngeal Swab  Result Value Ref Range Status   SARS Coronavirus 2 NEGATIVE NEGATIVE Final    Comment: (NOTE) SARS-CoV-2 target nucleic acids are NOT DETECTED.  The SARS-CoV-2 RNA is generally detectable in upper and lower respiratory specimens during the acute phase of infection. The lowest concentration of SARS-CoV-2 viral copies this assay can detect is 250 copies / mL. A negative result does not preclude SARS-CoV-2 infection and should not be used as the sole basis  for treatment or other patient management decisions.  A negative result may occur with improper specimen collection / handling, submission of specimen other than nasopharyngeal swab, presence of viral mutation(s) wit female with history of CVA, CAD, diabetes, hyperlipidemia, hypertension, bilateral nephrolithiasis with mild right hydroureteronephrosis -due to urothelial neoplasm or distal ureteral calculi based on Jan 25 imaging, chronic bladder outlet obstruction with large diverticulum in the bladder fundus, was brought in due to progressive generalized weakness.  Concern was for recurrent UTI.  Patient was hospitalized.   And subsequently developed hematuria.  Urology is following. Patient underwent cystoscopy stent placement.  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   Patient  admitted with recurrent UTI.  Was placed on antibiotics Patient subsequently developed hematuria.  Urology was consulted. Patient underwent cystoscopy and stent placement on 2/7.  Urine culture from urology office 1/31 and 10/25/20 no growth on cath urine sample, had ESBL E coli from pcp office from voided urine (less likely accurate). Patient currently on ceftriaxone.  Urine culture from this hospitalization without any growth. She has completed more than 7 days of antibiotics.  Will not be discharged on same.  No further hematuria noted.  Foley catheter was discontinued yesterday and the patient has been able to void. Outpatient follow-up with urology.  Hematuria See above.  Resolved.  Normocytic anemia/acute blood loss anemia Likely due to combination of dilution as well as hematuria.  Patient was transfused with improvement in hemoglobin.  Anemia panel reviewed.  No clear-cut deficiencies identified.  Debility, with multiple comorbidities Daughter would prefer to take her home with home health.    History of dementia/acute metabolic encephalopathy Acute confusion likely due to UTI.  Seems to have improved.  Severe hearing impairment She is also legally blind.  Supportive care.    Severe aortic stenosis/moderate MR/severe LVH/grade 2 diastolic dysfunction  As per daughter not a candidate for surgical intervention while in [place]. Echocardiogram was ordered on admission that showed EF 65 to 70% no regional wall motion abnormalities, severe LVH grade 2 diastolic dysfunction, moderate pericardial effusion no evidence of cardiac tamponade, moderate MR, severe aortic valve stenosis. Seems to be stable from cardiac standpoint.  DM2 HbA1c 7.5.    Hypokalemia Supplemented  Essential hypertension   History of CAD Plavix can be resumed on 2/14 per urology  Hx of CVA Stable.  Plavix can be resumed on 2/14 per urology  Stage I pressure ulcer Pressure Injury 11/23/20 Coccyx  Mid Stage 1 -  Intact skin with non-blanchable redness of a localized area usually over a bony prominence. pink/non blanchable (Active)  11/23/20 1640  Location: Coccyx  Location Orientation: Mid  Staging: Stage 1 -  Intact skin with non-blanchable redness of a localized area usually over a bony prominence.  Wound Description (Comments): pink/non blanchable  Present on Admission: Yes    Patient is stable.  Okay for discharge home today with daughter.  PERTINENT LABS:  The results of significant diagnostics from this hospitalization (including imaging, microbiology, ancillary and laboratory) are listed below for reference.    Microbiology: Recent Results (from the past 240 hour(s))  Urine Culture     Status: None   Collection Time: 11/22/20  4:03 PM   Specimen: Urine, Clean Catch  Result Value Ref Range Status   Specimen Description   Final    URINE, CLEAN CATCH Performed at [facility], [place] [address] [place], [place] [address]    Special Requests   Final    NONE Performed at [facility], [place] [address] [place], [place] [address]    Culture   Final    NO GROWTH Performed at [facility], [place] [address], [place], [place] [address]    Report Status 11/24/2020 FINAL  Final  SARS Coronavirus 2 by RT PCR (hospital order, performed in [facility] hospital lab) Nasopharyngeal Nasopharyngeal Swab     Status: None   Collection Time: 11/22/20  4:21 PM   Specimen: Nasopharyngeal Swab  Result Value Ref Range Status   SARS Coronavirus 2 NEGATIVE NEGATIVE Final    Comment: (NOTE) SARS-CoV-2 target nucleic acids are NOT DETECTED.  The SARS-CoV-2 RNA is generally detectable in upper and lower respiratory specimens during the acute phase of infection. The lowest concentration of SARS-CoV-2 viral copies this assay can detect is 250 copies / mL. A negative result does not preclude SARS-CoV-2 infection and should not be used as the sole basis  for treatment or other patient management decisions.  A negative result may occur with improper specimen collection / handling, submission of specimen other than nasopharyngeal swab, presence of viral mutation(s) within the areas targeted by this assay, and inadequate number of viral copies (<250 copies / mL). A negative result must be combined with clinical observations, patient history, and epidemiological information.  Fact Sheet for Patients:   StrictlyIdeas.no  Fact Sheet for Healthcare Providers: BankingDealers.co.za  This test is not yet approved or  cleared by the [place] FDA and has been authorized for detection and/or diagnosis of SARS-CoV-2 by FDA under an Emergency Use Authorization (EUA).  This EUA will remain in effect (meaning this test can be used) for the duration of the COVID-19 declaration under Section 564(b)(1) of the Act, 21 U.S.C. section 360bbb-3(b)(1), unless the authorization is terminated or revoked sooner.  Performed at [facility], [place] [address], [place], [place] [address]   Surgical pcr screen     Status: None   Collection Time: 11/26/20 12:00 AM   Specimen: Nasal Mucosa; Nasal Swab  Result Value Ref Range Status   MRSA, PCR NEGATIVE NEGATIVE Final   Staphylococcus aureus NEGATIVE NEGATIVE Final    Comment: (NOTE) The Xpert SA Assay (FDA approved for NASAL specimens in patients 41 years of age and older), is one component of a comprehensive surveillance program. It is not intended to diagnose infection nor to guide or monitor treatment. Performed at [facility], [place] [address], [place], [place] [address]      Labs:  COVID-19 Labs   Lab Results  Component Value Date   SARSCOV2NAA  NEGATIVE 11/22/2020      Basic Metabolic Panel: Recent Labs  Lab 11/26/20 0450 11/27/20 0459 11/28/20 0518 11/28/20 1215 11/29/20 0537 11/30/20 0530  NA 141  138 140  --  139 139  K 4.1 3.7 3.4*  --  3.5 3.9  CL 109 107 109  --  106 108  CO2 24 24 22   --  24 23  GLUCOSE 133* 115* 179*  --  114* 124*  BUN 10 9 9   --  9 13  CREATININE 0.57 0.59 0.54  --  0.61 0.79  CALCIUM 9.1 8.8* 8.9  --  9.0 9.2  MG  --   --   --  1.8  --   --    CBC: Recent Labs  Lab 11/26/20 0450 11/26/20 1544 11/27/20 0459 11/28/20 0518 11/29/20 0537 11/30/20 0530  WBC 7.8  --  10.3 9.2 8.4 9.5  HGB 8.0* 9.0* 7.4* 9.4* 9.4* 10.6*  HCT 26.0* 29.1* 24.0* 29.6* 28.9* 32.8*  MCV 95.6  --  96.4 94.9 93.8 93.4  PLT 295  --  310 329 309 287    CBG: Recent Labs  Lab 11/29/20 2025 11/29/20 2348 11/30/20 0417 11/30/20 0717 11/30/20 1117  GLUCAP 200* 190* 124* 128* 157*     IMAGING STUDIES CT HEAD WO CONTRAST  Result Date: 11/22/2020 CLINICAL DATA:  Focal neuro deficit greater than 6 hours. EXAM: CT HEAD WITHOUT CONTRAST TECHNIQUE: Contiguous axial images were obtained from the base of the skull through the vertex without intravenous contrast. COMPARISON:  CT head 05/25/2008 FINDINGS: Brain: Generalized atrophy. Extensive white matter hypodensity bilaterally, chronic. Negative for acute infarct, hemorrhage, mass. Vascular: Negative for hyperdense vessel Skull: Negative Sinuses/Orbits: Paranasal sinuses clear. Right mastoidectomy. Left mastoid clear. Bilateral cataract extraction. Other: None IMPRESSION: Atrophy and chronic white matter changes.  No acute abnormality. Electronically Signed   By: Franchot Gallo M.D.   On: 11/22/2020 17:40   DG Chest Portable 1 View  Result Date: 11/22/2020 CLINICAL DATA:  Weakness. EXAM: PORTABLE CHEST 1 VIEW COMPARISON:  05/25/2008 FINDINGS: The heart size is mildly enlarged. Aortic calcifications are noted. There is no pneumothorax. No large pleural effusion. No acute osseous abnormality. IMPRESSION: 1. No acute cardiopulmonary disease. 2. Mild cardiomegaly. 3. Aortic calcifications. Electronically Signed   By: Constance Holster M.D.    On: 11/22/2020 16:52   DG C-Arm 1-60 Min-No Report  Result Date: 11/26/2020 Fluoroscopy was utilized by the requesting physician.  No radiographic interpretation.   ECHOCARDIOGRAM COMPLETE  Result Date: 11/23/2020    ECHOCARDIOGRAM REPORT   Patient Name:   Rhenda Cornforth Date of Exam: 11/23/2020 Medical Rec #:  [id number]         Height:       64.0 in Accession #:    [id number]        Weight:       132.0 lb Date of Birth:  December 07, 1928         BSA:          1.640 m Patient Age:    35 years          BP:           142/66 mmHg Patient Gender: F                 HR:           50 bpm. Exam Location:  Inpatient Procedure: 2D Echo, 3D Echo, Cardiac Doppler and Color Doppler Indications:    I35.0 Nonrheumatic aortic (  valve) stenosis  History:        Patient has prior history of Echocardiogram examinations, most                 recent 05/26/2008. CAD, Aortic Valve Disease and Mitral Valve                 Disease, Signs/Symptoms:Altered Mental Status and Murmur; Risk                 Factors:Hypertension and Diabetes. Cancer. Dementia.  Sonographer:    Roseanna Rainbow RDCS Referring Phys: [place]  Sonographer Comments: Patient could not follow directions. IMPRESSIONS  1. Left ventricular ejection fraction, by estimation, is 65 to 70%. The left ventricle has normal function. The left ventricle has no regional wall motion abnormalities. There is severe left ventricular hypertrophy. Left ventricular diastolic parameters  are consistent with Grade II diastolic dysfunction (pseudonormalization).  2. Right ventricular systolic function is normal. The right ventricular size is normal. There is normal pulmonary artery systolic pressure.  3. Left atrial size was severely dilated.  4. Pericardial effusion - 1.1 cm. Moderate pericardial effusion. The pericardial effusion is lateral to the left ventricle. There is no evidence of cardiac tamponade.  5. The mitral valve is normal in structure. Moderate mitral valve  regurgitation. No evidence of mitral stenosis. The mean mitral valve gradient is 4.0 mmHg. Severe mitral annular calcification.  6. The aortic valve is tricuspid. Aortic valve regurgitation is not visualized. Severe aortic valve stenosis. Aortic valve area, by VTI measures 0.84 cm. Aortic valve mean gradient measures 38.3 mmHg. Aortic valve Vmax measures 3.97 m/s.  7. The inferior vena cava is normal in size with greater than 50% respiratory variability, suggesting right atrial pressure of 3 mmHg. FINDINGS  Left Ventricle: Left ventricular ejection fraction, by estimation, is 65 to 70%. The left ventricle has normal function. The left ventricle has no regional wall motion abnormalities. The left ventricular internal cavity size was normal in size. There is  severe left ventricular hypertrophy. Left ventricular diastolic parameters are consistent with Grade II diastolic dysfunction (pseudonormalization). Right Ventricle: The right ventricular size is normal. No increase in right ventricular wall thickness. Right ventricular systolic function is normal. There is normal pulmonary artery systolic pressure. The tricuspid regurgitant velocity is 2.41 m/s, and  with an assumed right atrial pressure of 3 mmHg, the estimated right ventricular systolic pressure is 32.3 mmHg. Left Atrium: Left atrial size was severely dilated. Right Atrium: Right atrial size was normal in size. Pericardium: Pericardial effusion - 1.1 cm. A moderately sized pericardial effusion is present. The pericardial effusion is lateral to the left ventricle. There is no evidence of cardiac tamponade. Mitral Valve: The mitral valve is normal in structure. There is moderate thickening of the mitral valve leaflet(s). There is moderate calcification of the mitral valve leaflet(s). Severe mitral annular calcification. Moderate mitral valve regurgitation. No evidence of mitral valve stenosis. MV peak gradient, 11.8 mmHg. The mean mitral valve gradient is 4.0  mmHg. Tricuspid Valve: The tricuspid valve is normal in structure. Tricuspid valve regurgitation is not demonstrated. No evidence of tricuspid stenosis. Aortic Valve: The aortic valve is tricuspid. Aortic valve regurgitation is not visualized. Severe aortic stenosis is present. Aortic valve mean gradient measures 38.3 mmHg. Aortic valve peak gradient measures 63.1 mmHg. Aortic valve area, by VTI measures  0.84 cm. Pulmonic Valve: The pulmonic valve was normal in structure. Pulmonic valve regurgitation is trivial. No evidence of pulmonic stenosis. Aorta: The aortic  root is normal in size and structure. Venous: The inferior vena cava is normal in size with greater than 50% respiratory variability, suggesting right atrial pressure of 3 mmHg. IAS/Shunts: No atrial level shunt detected by color flow Doppler.  LEFT VENTRICLE PLAX 2D LVIDd:         2.50 cm     Diastology LVIDs:         1.70 cm     LV e' medial:    3.00 cm/s LV PW:         2.20 cm     LV E/e' medial:  46.3 LV IVS:        1.90 cm     LV e' lateral:   3.50 cm/s LVOT diam:     2.00 cm     LV E/e' lateral: 39.7 LV SV:         94 LV SV Index:   57 LVOT Area:     3.14 cm  LV Volumes (MOD) LV vol d, MOD A2C: 50.7 ml LV vol d, MOD A4C: 58.5 ml LV vol s, MOD A2C: 19.4 ml LV vol s, MOD A4C: 22.0 ml LV SV MOD A2C:     31.3 ml LV SV MOD A4C:     58.5 ml LV SV MOD BP:      38.6 ml RIGHT VENTRICLE            IVC RV S prime:     8.18 cm/s  IVC diam: 1.60 cm TAPSE (M-mode): 1.8 cm LEFT ATRIUM             Index       RIGHT ATRIUM          Index LA diam:        4.30 cm 2.62 cm/m  RA Area:     9.23 cm LA Vol (A2C):   72.6 ml 44.28 ml/m RA Volume:   15.40 ml 9.39 ml/m LA Vol (A4C):   78.5 ml 47.88 ml/m LA Biplane Vol: 74.5 ml 45.44 ml/m  AORTIC VALVE                    PULMONIC VALVE AV Area (Vmax):    1.21 cm     PR End Diast Vel: 2.23 msec AV Area (Vmean):   0.87 cm AV Area (VTI):     0.84 cm AV Vmax:           397.33 cm/s AV Vmean:          291.000 cm/s AV VTI:             1.117 m AV Peak Grad:      63.1 mmHg AV Mean Grad:      38.3 mmHg LVOT Vmax:         153.00 cm/s LVOT Vmean:        80.700 cm/s LVOT VTI:          0.299 m LVOT/AV VTI ratio: 0.27  AORTA Ao Root diam: 3.00 cm Ao Asc diam:  3.10 cm MITRAL VALVE                 TRICUSPID VALVE MV Area (PHT): 1.50 cm      TR Peak grad:   23.2 mmHg MV Area VTI:   1.35 cm      TR Vmax:        241.00 cm/s MV Peak grad:  11.8 mmHg MV Mean grad:  4.0 mmHg  SHUNTS MV Vmax:       1.72 m/s      Systemic VTI:  0.30 m MV Vmean:      91.5 cm/s     Systemic Diam: 2.00 cm MV Decel Time: 507 msec MR Peak grad:    134.6 mmHg MR Mean grad:    82.0 mmHg MR Vmax:         580.00 cm/s MR Vmean:        417.0 cm/s MR PISA:         4.02 cm MR PISA Eff ROA: 26 mm MR PISA Radius:  0.80 cm MV E velocity: 139.00 cm/s MV A velocity: 108.00 cm/s MV E/A ratio:  1.29 Candee Furbish MD Electronically signed by Candee Furbish MD Signature Date/Time: 11/23/2020/11:24:30 AM    Final     DISCHARGE EXAMINATION: Vitals:   11/29/20 0436 11/29/20 1335 11/29/20 2026 11/30/20 0419  BP: (!) 162/76 (!) 155/115 138/89 (!) 150/63  Pulse: 88 91 79 92  Resp: 18 19 17 18   Temp: 97.9 F (36.6 C) 98.2 F (36.8 C) 98.1 F (36.7 C) 98.4 F (36.9 C)  TempSrc:      SpO2: 94% 96% 94% 96%  Weight:      Height:       General appearance: Awake alert.  In no distress.  Pleasantly confused Resp: Clear to auscultation bilaterally.  Normal effort Cardio: S1-S2 is normal regular.  No S3-S4.  No rubs murmurs or bruit    DISPOSITION: Home with daughter  Discharge Instructions    Call MD for:  difficulty breathing, headache or visual disturbances   Complete by: As directed    Call MD for:  extreme fatigue   Complete by: As directed    Call MD for:  persistant dizziness or light-headedness   Complete by: As directed    Call MD for:  persistant nausea and vomiting   Complete by: As directed    Call MD for:  severe uncontrolled pain   Complete by: As directed     Call MD for:  temperature >100.4   Complete by: As directed    Diet - low sodium heart healthy   Complete by: As directed    Discharge instructions   Complete by: As directed    Please resume aspirin and Plavix on 14th only if there is no further episodes of bleeding in the urine.  Take medications as prescribed.  Follow-up with urology as instructed by them.  You were cared for by a hospitalist during your hospital stay. If you have any questions about your discharge medications or the care you received while you were in the hospital after you are discharged, you can call the unit and asked to speak with the hospitalist on call if the hospitalist that took care of you is not available. Once you are discharged, your primary care physician will handle any further medical issues. Please note that NO REFILLS for any discharge medications will be authorized once you are discharged, as it is imperative that you return to your primary care physician (or establish a relationship with a primary care physician if you do not have one) for your aftercare needs so that they can reassess your need for medications and monitor your lab values. If you do not have a primary care physician, you can call [phone] for a physician referral.   Increase activity slowly   Complete by: As directed    No wound care   Complete by: As directed  Allergies as of 11/30/2020      Reactions   Codeine    Syncope       Medication List    STOP taking these medications   amoxicillin-clavulanate 500-125 MG tablet Commonly known as: AUGMENTIN     TAKE these medications   aspirin 81 MG tablet Take 1 tablet (81 mg total) by mouth at bedtime. RESUME ON 12/03/20 IF NO BLOOD IN THE URINE Start taking on: December 03, 2020 What changed:   additional instructions  These instructions start on December 03, 2020. If you are unsure what to do until then, ask your doctor or other care provider.   citalopram 20 MG  tablet Commonly known as: CELEXA Take 20 mg by mouth at bedtime.   clopidogrel 75 MG tablet Commonly known as: PLAVIX Take 1 tablet (75 mg total) by mouth daily. RESUME ON 12/03/20 IF NO BLOOD IN THE URINE Start taking on: December 03, 2020 What changed:   additional instructions  These instructions start on December 03, 2020. If you are unsure what to do until then, ask your doctor or other care provider.   CRANBERRY PO Take 4,200 mg by mouth 2 (two) times daily.   donepezil 10 MG tablet Commonly known as: ARICEPT Take 10 mg by mouth at bedtime.   fenofibrate 160 MG tablet Take 160 mg by mouth at bedtime.   furosemide 20 MG tablet Commonly known as: LASIX Take 20 mg by mouth daily.   insulin NPH-regular Human (70-30) 100 UNIT/ML injection Inject 1-7 Units into the skin 2 (two) times daily as needed (high blood sugar). If blood sugar is over 150 takes 1 unit ; sliding scale   mupirocin ointment 2 % Commonly known as: BACTROBAN Apply 1 application topically 2 (two) times daily as needed for wound care.         Follow-up Information    Vicenta Aly, [place]. Schedule an appointment as soon as possible for a visit in 1 week(s).   Specialty: Nurse Practitioner Contact information: [place] [address] [phone]        Remi Haggard, MD. Schedule an appointment as soon as possible for a visit in 10 day(s).   Specialty: Urology Contact information: [address] [place] [address] [phone]               TOTAL DISCHARGE TIME: 35 minutes  [facility] Pager on [email or web address]  11/30/2020, 4:30 PM

## 2020-11-30 NOTE — Care Management Important Message (Signed)
Medicare IM printed for Rebecca Pruitt to give to the patient.  

## 2020-11-30 NOTE — TOC Transition Note (Signed)
Transition of Care [facility]) - CM/SW Discharge Note   Patient Details  Name: Rebecca Pruitt MRN: [id number] Date of Birth: [birth year]  Transition of Care [facility]) CM/SW Contact:  Ross Ludwig, LCSW Phone Number: 11/30/2020, 9:35 AM   Clinical Narrative:     Patient will be going home with home health through [facility].  CSW signing off please reconsult with any other social work needs, home health agency has been notified of planned discharge.  Patient's daughter is aware that patient is discharging today.    Final next level of care: [place] Barriers to Discharge: Barriers Resolved   Patient Goals and CMS Choice Patient states their goals for this hospitalization and ongoing recovery are:: To return back home with home health. CMS Medicare.gov Compare Post Acute Care list provided to:: Patient Represenative (must comment) Choice offered to / list presented to : Adult Children  Discharge Placement  Patient will be discharging back home with home health services.                     Discharge Plan and Services   Discharge Planning Services: CM Consult                      HH Arranged: PT,OT,RN   Date [place]: 11/30/20 Time [place]: [phone] Representative spoke with at [place]: Amy  Social Determinants of Health ([place]) Interventions     Readmission Risk Interventions No flowsheet data found.

## 2020-12-13 ENCOUNTER — Telehealth: Payer: Self-pay

## 2020-12-13 NOTE — Telephone Encounter (Signed)
Patient's daughter called stating patient saw urology on Monday and urine culture was done.  Urine culture came back positive for E coli and and has a strong resistance.  Patient's daughter stated she was called this afternoon stating the urologist is wanting to start her on IV antibiotics, but unsure if this will be done tomorrow. She  wanted to let our office know just in case she has to reschedule her appointment with Dr. Juleen China tomorrow due to picc line getting placed tomorrow. Patient's daughter has been given option for the patient to do a phone visit tomorrow if she can not make it to the office.  Patient's daughter will call our office in the morning if she needs to change the appointment.  Diminique T Brooks Sailors

## 2020-12-14 ENCOUNTER — Encounter: Payer: Self-pay | Admitting: Internal Medicine

## 2020-12-14 ENCOUNTER — Inpatient Hospital Stay (HOSPITAL_COMMUNITY)
Admission: EM | Admit: 2020-12-14 | Discharge: 2020-12-23 | DRG: 689 | Disposition: A | Discharge status: Home Health | Payer: Medicare Other | Attending: Family Medicine | Admitting: Family Medicine

## 2020-12-14 ENCOUNTER — Encounter (HOSPITAL_COMMUNITY): Payer: Self-pay | Admitting: Pharmacy Technician

## 2020-12-14 ENCOUNTER — Ambulatory Visit (INDEPENDENT_AMBULATORY_CARE_PROVIDER_SITE_OTHER): Payer: Medicare Other | Admitting: Internal Medicine

## 2020-12-14 ENCOUNTER — Other Ambulatory Visit: Payer: Self-pay

## 2020-12-14 VITALS — BP 171/78 | HR 66 | Wt 105.0 lb

## 2020-12-14 DIAGNOSIS — N390 Urinary tract infection, site not specified: Secondary | ICD-10-CM | POA: Diagnosis not present

## 2020-12-14 DIAGNOSIS — Z8673 Personal history of transient ischemic attack (TIA), and cerebral infarction without residual deficits: Secondary | ICD-10-CM

## 2020-12-14 DIAGNOSIS — E876 Hypokalemia: Secondary | ICD-10-CM

## 2020-12-14 DIAGNOSIS — G9341 Metabolic encephalopathy: Secondary | ICD-10-CM | POA: Diagnosis present

## 2020-12-14 DIAGNOSIS — R319 Hematuria, unspecified: Secondary | ICD-10-CM | POA: Diagnosis not present

## 2020-12-14 DIAGNOSIS — R29810 Facial weakness: Secondary | ICD-10-CM | POA: Diagnosis not present

## 2020-12-14 DIAGNOSIS — B9620 Unspecified Escherichia coli [E. coli] as the cause of diseases classified elsewhere: Secondary | ICD-10-CM | POA: Diagnosis present

## 2020-12-14 DIAGNOSIS — Z79899 Other long term (current) drug therapy: Secondary | ICD-10-CM

## 2020-12-14 DIAGNOSIS — I080 Rheumatic disorders of both mitral and aortic valves: Secondary | ICD-10-CM | POA: Diagnosis present

## 2020-12-14 DIAGNOSIS — Z1624 Resistance to multiple antibiotics: Secondary | ICD-10-CM | POA: Diagnosis present

## 2020-12-14 DIAGNOSIS — Z87442 Personal history of urinary calculi: Secondary | ICD-10-CM

## 2020-12-14 DIAGNOSIS — F32A Depression, unspecified: Secondary | ICD-10-CM | POA: Diagnosis present

## 2020-12-14 DIAGNOSIS — R29708 NIHSS score 8: Secondary | ICD-10-CM | POA: Diagnosis not present

## 2020-12-14 DIAGNOSIS — Z20822 Contact with and (suspected) exposure to covid-19: Secondary | ICD-10-CM | POA: Diagnosis present

## 2020-12-14 DIAGNOSIS — Q248 Other specified congenital malformations of heart: Secondary | ICD-10-CM | POA: Diagnosis not present

## 2020-12-14 DIAGNOSIS — I313 Pericardial effusion (noninflammatory): Secondary | ICD-10-CM | POA: Diagnosis present

## 2020-12-14 DIAGNOSIS — G928 Other toxic encephalopathy: Secondary | ICD-10-CM | POA: Diagnosis present

## 2020-12-14 DIAGNOSIS — R64 Cachexia: Secondary | ICD-10-CM | POA: Diagnosis present

## 2020-12-14 DIAGNOSIS — R471 Dysarthria and anarthria: Secondary | ICD-10-CM | POA: Diagnosis present

## 2020-12-14 DIAGNOSIS — I482 Chronic atrial fibrillation, unspecified: Secondary | ICD-10-CM | POA: Diagnosis not present

## 2020-12-14 DIAGNOSIS — Z7902 Long term (current) use of antithrombotics/antiplatelets: Secondary | ICD-10-CM

## 2020-12-14 DIAGNOSIS — Z794 Long term (current) use of insulin: Secondary | ICD-10-CM

## 2020-12-14 DIAGNOSIS — H548 Legal blindness, as defined in USA: Secondary | ICD-10-CM | POA: Diagnosis present

## 2020-12-14 DIAGNOSIS — Z87440 Personal history of urinary (tract) infections: Secondary | ICD-10-CM

## 2020-12-14 DIAGNOSIS — F419 Anxiety disorder, unspecified: Secondary | ICD-10-CM | POA: Diagnosis present

## 2020-12-14 DIAGNOSIS — Z7982 Long term (current) use of aspirin: Secondary | ICD-10-CM

## 2020-12-14 DIAGNOSIS — Z90710 Acquired absence of both cervix and uterus: Secondary | ICD-10-CM

## 2020-12-14 DIAGNOSIS — I110 Hypertensive heart disease with heart failure: Secondary | ICD-10-CM | POA: Diagnosis present

## 2020-12-14 DIAGNOSIS — H9190 Unspecified hearing loss, unspecified ear: Secondary | ICD-10-CM | POA: Diagnosis present

## 2020-12-14 DIAGNOSIS — E785 Hyperlipidemia, unspecified: Secondary | ICD-10-CM | POA: Diagnosis present

## 2020-12-14 DIAGNOSIS — N320 Bladder-neck obstruction: Secondary | ICD-10-CM | POA: Diagnosis present

## 2020-12-14 DIAGNOSIS — R1310 Dysphagia, unspecified: Secondary | ICD-10-CM | POA: Diagnosis present

## 2020-12-14 DIAGNOSIS — Z515 Encounter for palliative care: Secondary | ICD-10-CM

## 2020-12-14 DIAGNOSIS — F0150 Vascular dementia without behavioral disturbance: Secondary | ICD-10-CM | POA: Diagnosis present

## 2020-12-14 DIAGNOSIS — D649 Anemia, unspecified: Secondary | ICD-10-CM

## 2020-12-14 DIAGNOSIS — I2510 Atherosclerotic heart disease of native coronary artery without angina pectoris: Secondary | ICD-10-CM | POA: Diagnosis present

## 2020-12-14 DIAGNOSIS — F0391 Unspecified dementia with behavioral disturbance: Secondary | ICD-10-CM

## 2020-12-14 DIAGNOSIS — E119 Type 2 diabetes mellitus without complications: Secondary | ICD-10-CM | POA: Diagnosis not present

## 2020-12-14 DIAGNOSIS — Z1612 Extended spectrum beta lactamase (ESBL) resistance: Secondary | ICD-10-CM | POA: Diagnosis present

## 2020-12-14 DIAGNOSIS — I5032 Chronic diastolic (congestive) heart failure: Secondary | ICD-10-CM | POA: Diagnosis present

## 2020-12-14 DIAGNOSIS — Z885 Allergy status to narcotic agent status: Secondary | ICD-10-CM

## 2020-12-14 DIAGNOSIS — Z681 Body mass index (BMI) 19 or less, adult: Secondary | ICD-10-CM

## 2020-12-14 DIAGNOSIS — R627 Adult failure to thrive: Secondary | ICD-10-CM

## 2020-12-14 DIAGNOSIS — I484 Atypical atrial flutter: Secondary | ICD-10-CM | POA: Diagnosis present

## 2020-12-14 LAB — URINALYSIS, ROUTINE W REFLEX MICROSCOPIC
Bilirubin Urine: NEGATIVE
Glucose, UA: NEGATIVE mg/dL
Ketones, ur: NEGATIVE mg/dL
Nitrite: NEGATIVE
Protein, ur: 100 mg/dL — AB
Specific Gravity, Urine: 1.011 (ref 1.005–1.030)
WBC, UA: >50 WBC/hpf — ABNORMAL HIGH (ref 0–5)
pH: 6 (ref 5.0–8.0)

## 2020-12-14 LAB — COMPREHENSIVE METABOLIC PANEL
ALT: 11 U/L (ref 0–44)
AST: 16 U/L (ref 15–41)
Albumin: 2.8 g/dL — ABNORMAL LOW (ref 3.5–5.0)
Alkaline Phosphatase: 44 U/L (ref 38–126)
Anion gap: 10 (ref 5–15)
BUN: 16 mg/dL (ref 8–23)
CO2: 25 mmol/L (ref 22–32)
Calcium: 9.8 mg/dL (ref 8.9–10.3)
Chloride: 107 mmol/L (ref 98–111)
Creatinine, Ser: 0.73 mg/dL (ref 0.44–1.00)
GFR, Estimated: >60 mL/min (ref >60)
Glucose, Bld: 152 mg/dL — ABNORMAL HIGH (ref 70–99)
Potassium: 3.4 mmol/L — ABNORMAL LOW (ref 3.5–5.1)
Sodium: 142 mmol/L (ref 135–145)
Total Bilirubin: 0.5 mg/dL (ref 0.3–1.2)
Total Protein: 6.3 g/dL — ABNORMAL LOW (ref 6.5–8.1)

## 2020-12-14 LAB — CBC WITH DIFFERENTIAL/PLATELET
Abs Immature Granulocytes: 0.01 10*3/uL (ref 0.00–0.07)
Basophils Absolute: 0.1 10*3/uL (ref 0.0–0.1)
Basophils Relative: 1 %
Eosinophils Absolute: 0.2 10*3/uL (ref 0.0–0.5)
Eosinophils Relative: 3 %
HCT: 38 % (ref 36.0–46.0)
Hemoglobin: 11 g/dL — ABNORMAL LOW (ref 12.0–15.0)
Immature Granulocytes: 0 %
Lymphocytes Relative: 21 %
Lymphs Abs: 1.4 10*3/uL (ref 0.7–4.0)
MCH: 28.4 pg (ref 26.0–34.0)
MCHC: 28.9 g/dL — ABNORMAL LOW (ref 30.0–36.0)
MCV: 98.2 fL (ref 80.0–100.0)
Monocytes Absolute: 0.9 10*3/uL (ref 0.1–1.0)
Monocytes Relative: 14 %
Neutro Abs: 4 10*3/uL (ref 1.7–7.7)
Neutrophils Relative %: 61 %
Platelets: 369 10*3/uL (ref 150–400)
RBC: 3.87 MIL/uL (ref 3.87–5.11)
RDW: 14.5 % (ref 11.5–15.5)
WBC: 6.6 10*3/uL (ref 4.0–10.5)
nRBC: 0 % (ref 0.0–0.2)

## 2020-12-14 LAB — LACTIC ACID, PLASMA: Lactic Acid, Venous: 1.2 mmol/L (ref 0.5–1.9)

## 2020-12-14 LAB — RESP PANEL BY RT-PCR (FLU A&B, COVID) ARPGX2
Influenza A by PCR: NEGATIVE
Influenza B by PCR: NEGATIVE
SARS Coronavirus 2 by RT PCR: NEGATIVE

## 2020-12-14 LAB — GLUCOSE, CAPILLARY: Glucose-Capillary: 152 mg/dL — ABNORMAL HIGH (ref 70–99)

## 2020-12-14 MED ORDER — DONEPEZIL HCL 10 MG PO TABS
10.0000 mg | ORAL_TABLET | Freq: Every day | ORAL | Status: DC
  Filled 2020-12-14: qty 1

## 2020-12-14 MED ORDER — SODIUM CHLORIDE 0.9 % IV SOLN
1.0000 g | Freq: Two times a day (BID) | INTRAVENOUS | Status: AC
  Administered 2020-12-14 – 2020-12-19 (×9): 1 g via INTRAVENOUS
  Filled 2020-12-14 (×10): qty 1

## 2020-12-14 MED ORDER — ENOXAPARIN SODIUM 40 MG/0.4ML ~~LOC~~ SOLN: 40.0000 mg | SUBCUTANEOUS | Status: DC

## 2020-12-14 MED ORDER — POTASSIUM CHLORIDE CRYS ER 20 MEQ PO TBCR
40.0000 meq | EXTENDED_RELEASE_TABLET | Freq: Once | ORAL | Status: DC
  Filled 2020-12-14: qty 2

## 2020-12-14 MED ORDER — INSULIN ASPART 100 UNIT/ML ~~LOC~~ SOLN
0.0000 [IU] | Freq: Every day | SUBCUTANEOUS | Status: DC
  Administered 2020-12-15: 3 [IU] via SUBCUTANEOUS
  Administered 2020-12-16 – 2020-12-17 (×2): 2 [IU] via SUBCUTANEOUS
  Administered 2020-12-18: 3 [IU] via SUBCUTANEOUS

## 2020-12-14 MED ORDER — INSULIN ASPART 100 UNIT/ML ~~LOC~~ SOLN
0.0000 [IU] | Freq: Three times a day (TID) | SUBCUTANEOUS | Status: DC
  Administered 2020-12-15: 1 [IU] via SUBCUTANEOUS
  Administered 2020-12-16: 3 [IU] via SUBCUTANEOUS
  Administered 2020-12-16: 1 [IU] via SUBCUTANEOUS
  Administered 2020-12-16: 5 [IU] via SUBCUTANEOUS
  Administered 2020-12-17: 1 [IU] via SUBCUTANEOUS
  Administered 2020-12-18: 3 [IU] via SUBCUTANEOUS
  Administered 2020-12-18: 2 [IU] via SUBCUTANEOUS
  Administered 2020-12-18: 1 [IU] via SUBCUTANEOUS
  Administered 2020-12-19: 3 [IU] via SUBCUTANEOUS
  Administered 2020-12-19: 2 [IU] via SUBCUTANEOUS
  Administered 2020-12-19: 1 [IU] via SUBCUTANEOUS
  Administered 2020-12-20: 3 [IU] via SUBCUTANEOUS
  Administered 2020-12-20: 2 [IU] via SUBCUTANEOUS
  Administered 2020-12-20 – 2020-12-21 (×3): 1 [IU] via SUBCUTANEOUS
  Administered 2020-12-21: 3 [IU] via SUBCUTANEOUS
  Administered 2020-12-22: 1 [IU] via SUBCUTANEOUS
  Administered 2020-12-22: 2 [IU] via SUBCUTANEOUS
  Administered 2020-12-23: 1 [IU] via SUBCUTANEOUS

## 2020-12-14 MED ORDER — CITALOPRAM HYDROBROMIDE 20 MG PO TABS
20.0000 mg | ORAL_TABLET | Freq: Every day | ORAL | Status: DC
  Filled 2020-12-14: qty 1

## 2020-12-14 MED ORDER — CLOPIDOGREL BISULFATE 75 MG PO TABS
75.0000 mg | ORAL_TABLET | Freq: Every day | ORAL | Status: DC
  Administered 2020-12-16 – 2020-12-18 (×3): 75 mg via ORAL
  Filled 2020-12-14 (×3): qty 1

## 2020-12-14 MED ORDER — ENOXAPARIN SODIUM 30 MG/0.3ML ~~LOC~~ SOLN
30.0000 mg | SUBCUTANEOUS | Status: DC
  Administered 2020-12-14 – 2020-12-15 (×2): 30 mg via SUBCUTANEOUS
  Filled 2020-12-14 (×2): qty 0.3

## 2020-12-14 NOTE — ED Provider Notes (Signed)
[facility] EMERGENCY DEPARTMENT Provider Note   CSN: [id number] Arrival date & time: 12/14/20  1209     History Chief Complaint  Patient presents with  . Recurrent UTI  . Weakness    Rebecca Pruitt is a 87 y.o. female.  HPI Patient is an 87 year old female with history of recurrent UTIs who was sent to the emergency department for admission and IV antibiotics for possible ESBL urinary tract infection.  Patient was evaluated earlier today by Jule Ser, DO with ID.  Please see his note for additional details.  Patient hospitalized from February 3 through February 11 due to progressive generalized weakness as well as concern for a recurrent UTI.  During this visit urology evaluated patient and she underwent cystoscopy with fulguration of the right ureter and bladder with stent placement on February 7.  She had multiple cultures since then which were negative but she most recently had a positive urine culture with ESBL E. coli.  She followed up with urology 3 days ago for stent removal but her urine appeared infected so they did not remove the stent.  Her son-in-law is in the room and states that her stent is still in place.  She has been taking Augmentin for her UTI which she finished last night.  Her son-in-law states that she typically will "carry on a conversation" and can walk over 100 steps.  He states that she has been declining for the past week and is now increasingly lethargic and speaking less and less.  Patient is oriented to self.  Appears extremely fatigued.  She will answer questions and follow commands but otherwise does not provide any reliable history.  Level 5 caveat due to dementia     Past Medical History:  Diagnosis Date  . Cancer ([place])    skin  . Coronary artery disease   . Diabetes mellitus   . Diabetic retinopathy   . Dyslipidemia   . Family history of adverse reaction to anesthesia    pts daughter vomits and has very difficult time to  awaken  . H/O: CVA (cardiovascular accident)   . Hearing loss   . Heart murmur   . History of kidney stones   . Hypertension   . Stroke [facility]) 03/2020   x5    Patient Active Problem List   Diagnosis Date Noted  . Pressure injury of skin 11/24/2020  . UTI (urinary tract infection) 11/22/2020  . Debility 11/22/2020  . Dementia ([place]) 11/22/2020  . Abnormal cardiac valve 11/22/2020  . DM2 (diabetes mellitus, type 2) ([place]) 11/22/2020    Past Surgical History:  Procedure Laterality Date  . CESAREAN SECTION    . CYSTOSCOPY WITH RETROGRADE PYELOGRAM, URETEROSCOPY AND STENT PLACEMENT Right 11/26/2020   Procedure: CYSTOSCOPY WITH RIGHT RETROGRADE PYELOGRAM, RIGHT URETEROSCOPY AND PYELOSCOPY, RIGHT STENT PLACEMENT, FULGERATION OF RIGHT URETER AND BLADDER;  Surgeon: Remi Haggard, MD;  Location: [facility] ORS;  Service: Urology;  Laterality: Right;  . DILATION AND CURETTAGE OF UTERUS    . EYE SURGERY Bilateral   . hysterectomy - unknown type    . MASTOIDECTOMY     as a child     OB History   No obstetric history on file.     Family History  Problem Relation Age of Onset  . Prostate cancer Other     Social History   Tobacco Use  . Smoking status: Never Smoker  . Smokeless tobacco: Never Used  Vaping Use  . Vaping Use: Never used  Substance Use Topics  . Alcohol use: No  . Drug use: Never    Home Medications Prior to Admission medications   Medication Sig Start Date End Date Taking? Authorizing Provider  aspirin 81 MG tablet Take 1 tablet (81 mg total) by mouth at bedtime. RESUME ON 12/03/20 IF NO BLOOD IN THE URINE 12/03/20   Bonnielee Haff, MD  citalopram (CELEXA) 20 MG tablet Take 20 mg by mouth at bedtime.    [provider]  clopidogrel (PLAVIX) 75 MG tablet Take 1 tablet (75 mg total) by mouth daily. RESUME ON 12/03/20 IF NO BLOOD IN THE URINE 12/03/20   Bonnielee Haff, MD  CONTOUR NEXT TEST test strip 1 each 3 (three) times daily. 12/05/20   [provider]  CRANBERRY PO Take 4,200 mg by mouth 2 (two) times daily.    [provider]  donepezil (ARICEPT) 10 MG tablet Take 10 mg by mouth at bedtime.    [provider]  fenofibrate 160 MG tablet Take 160 mg by mouth at bedtime.    [provider]  furosemide (LASIX) 20 MG tablet Take 20 mg by mouth daily.    [provider]  insulin NPH-insulin regular (NOVOLIN 70/30) (70-30) 100 UNIT/ML injection Inject 1-7 Units into the skin 2 (two) times daily as needed (high blood sugar). If blood sugar is over 150 takes 1 unit ; sliding scale    [provider]  Insulin Pen Needle (B-D ULTRAFINE III SHORT PEN) 31G X 8 MM MISC See admin instructions. 12/13/20   [provider]  LORazepam (ATIVAN) 0.5 MG tablet Take 0.5 mg by mouth every 8 (eight) hours as needed. 12/05/20   [provider]  mupirocin ointment (BACTROBAN) 2 % Apply 1 application topically 2 (two) times daily as needed for wound care. 09/25/20   [provider]    Allergies    Codeine  Review of Systems   Review of Systems  Unable to perform ROS: Dementia   Physical Exam Updated Vital Signs BP (!) 155/64   Pulse 67   Temp 100.1 F (37.8 C) (Oral)   Resp (!) 21   SpO2 97%   Physical Exam Vitals and nursing note reviewed.  Constitutional:      General: She is not in acute distress.    Appearance: Normal appearance. She is not ill-appearing, toxic-appearing or diaphoretic.     Comments: Cachectic, elderly, pleasantly demented female.  Sitting upright and speaking in short sentences.  Answers questions when asked.  Oriented to self.  Appears extremely fatigued.  HENT:     Head: Normocephalic and atraumatic.     Right Ear: External ear normal.     Left Ear: External ear normal.     Nose: Nose normal.     Mouth/Throat:     Mouth: Mucous membranes are moist.     Pharynx: Oropharynx is clear. No oropharyngeal exudate or posterior oropharyngeal erythema.  Eyes:      General: No scleral icterus.       Right eye: No discharge.        Left eye: No discharge.     Conjunctiva/sclera: Conjunctivae normal.     Pupils: Pupils are equal, round, and reactive to light.  Cardiovascular:     Rate and Rhythm: Normal rate and regular rhythm.     Pulses: Normal pulses.     Heart sounds: Murmur heard.  No friction rub. No gallop.   Pulmonary:     Effort: No respiratory  distress.     Breath sounds: Normal breath sounds. No stridor. No wheezing, rhonchi or rales.     Comments: Poor inspiratory effort. Abdominal:     General: Abdomen is flat.     Palpations: Abdomen is soft.     Tenderness: There is no abdominal tenderness.  Musculoskeletal:        General: Normal range of motion.     Cervical back: Normal range of motion and neck supple. No tenderness.     Right lower leg: Edema present.     Left lower leg: Edema present.     Comments: Trace pedal edema.  Skin:    General: Skin is warm and dry.  Neurological:     Comments: History of dementia.  Oriented to self.  Appears extremely fatigued.  Will answer questions and follow commands when asked.      ED Results / Procedures / Treatments   Labs (all labs ordered are listed, but only abnormal results are displayed) Labs Reviewed  COMPREHENSIVE METABOLIC PANEL - Abnormal; Notable for the following components:      Result Value   Potassium 3.4 (*)    Glucose, Bld 152 (*)    Total Protein 6.3 (*)    Albumin 2.8 (*)    All other components within normal limits  CBC WITH DIFFERENTIAL/PLATELET - Abnormal; Notable for the following components:   Hemoglobin 11.0 (*)    MCHC 28.9 (*)    All other components within normal limits  URINE CULTURE  RESP PANEL BY RT-PCR (FLU A&B, COVID) ARPGX2  CULTURE, BLOOD (ROUTINE X 2)  CULTURE, BLOOD (ROUTINE X 2)  LACTIC ACID, PLASMA  LACTIC ACID, PLASMA  URINALYSIS, ROUTINE W REFLEX MICROSCOPIC    EKG None  Radiology No results found.  Procedures Procedures    Medications Ordered in ED Medications - No data to display  ED Course  I have reviewed the triage vital signs and the nursing notes.  Pertinent labs & imaging results that were available during my care of the patient were reviewed by me and considered in my medical decision making (see chart for details).    MDM Rules/Calculators/A&P                          Patient presents today after being evaluated by infectious disease for recurrent UTIs.  Recent culture shows ESBL E. coli.  ID recommended admission for IV ertapenem.  Patient discussed with pharmacy who will start patient on IV meropenem.  CBC without leukocytosis.  Hemoglobin of 11.  CMP with a potassium of 3.4, glucose of 152, total protein of 6.3, albumin of 2.8.  Initial lactic acid of 1.2.  UA and urine cultures are pending.  Respiratory panel is pending.  Will discuss with the medicine team for admission.  Note: Portions of this report may have been transcribed using voice recognition software. Every effort was made to ensure accuracy; however, inadvertent computerized transcription errors may be present.   Final Clinical Impression(s) / ED Diagnoses Final diagnoses:  Recurrent UTI  Failure to thrive in adult   Rx / DC Orders ED Discharge Orders    None       Rayna Sexton, PA-C 12/14/20 1943    Lucrezia Starch, MD 12/14/20 2312

## 2020-12-14 NOTE — Progress Notes (Signed)
[place] for Infectious Disease  Reason for Consult:  ESBL UTI and recurrent delirium  Referring Provider: Vicenta Aly, NP   HPI:    Rebecca Pruitt is a 87 y.o. female with PMHx as below who presents to the clinic for further evaluation of ESBL urinary tract infection in the setting of bladder outlet obstruction with large diverticulum.  She was recently hospitalized from 11/22/2020 through 11/30/2020 where she was hospitalized due to progressive generalized weakness and concern for a recurrent UTI.  She was seen by urology and underwent cystoscopy with fulguration of right ureter and bladder with stent placement on 11/26/2020.  Urine culture from admission on 2/3 was no growth.  Per hospital discharge summary urine culture from her urology office January 31 and January 6 were no growth on a catheterized urine sample.  However, she did have ESBL E. coli previously from a voided urine sample at her primary care office.  She followed up with urology approximately 3 days ago for stent removal, however, her urine looked infected per their report and so the stent was not removed at that time.  Her urine culture has come back positive with approximately 50,000 colonies of ESBL E. coli.  She was placed on Augmentin prior to this susceptibility report returning.  Plan is for PICC line placement and ertapenem based on susceptibilities.  Of note, it appears that this isolate is also susceptible to Macrobid.  Patient is accompanied by her daughter and granddaughter today and they report over the last several days patient has become increasingly lethargic and less interactive.  They are concerned for worsening infection as a cause of her decline.  She has not had any fevers but this is not uncommon for her.  They are concerned regarding the potential delay in PICC line placement and getting started on appropriate therapy and her risk of further decline as a result. Her daughter reports that when she  left the hospital she was much more active and was able to walk over 100 steps and would interact with her.  However, now she is barely able to transition her from the car to a wheelchair and patient is much more lethargic.  Of note patient's mother is also concerned that her recurrent delirium episodes are a result of hidden infection that have been ongoing for approximately 6 years.  She reports a prior history of brain infection 6 years ago when much of this seems to have developed.  She reports that her mom will receive courses of antibiotics with subsequent improvement in her mental status.  This particularly seems to be the case when she receives IV antibiotics but also occurs even when receiving oral therapy.  She has been told that this is likely not related to an infection given no objective findings to support an infectious etiology and likely is the result of progressive decline from her dementia.     Patient's Medications  New Prescriptions   No medications on file  Previous Medications   AMOXICILLIN-CLAVULANATE (AUGMENTIN) 500-125 MG TABLET    Take 1 tablet by mouth 2 (two) times daily.   ASPIRIN 81 MG TABLET    Take 1 tablet (81 mg total) by mouth at bedtime. RESUME ON 12/03/20 IF NO BLOOD IN THE URINE   CITALOPRAM (CELEXA) 20 MG TABLET    Take 20 mg by mouth at bedtime.   CLOPIDOGREL (PLAVIX) 75 MG TABLET    Take 1 tablet (75 mg total) by mouth daily. RESUME ON  12/03/20 IF NO BLOOD IN THE URINE   CONTOUR NEXT TEST TEST STRIP    1 each 3 (three) times daily.   CRANBERRY PO    Take 4,200 mg by mouth 2 (two) times daily.   DONEPEZIL (ARICEPT) 10 MG TABLET    Take 10 mg by mouth at bedtime.   FENOFIBRATE 160 MG TABLET    Take 160 mg by mouth at bedtime.   FUROSEMIDE (LASIX) 20 MG TABLET    Take 20 mg by mouth daily.   INSULIN NPH-INSULIN REGULAR (NOVOLIN 70/30) (70-30) 100 UNIT/ML INJECTION    Inject 1-7 Units into the skin 2 (two) times daily as needed (high blood sugar). If blood sugar  is over 150 takes 1 unit ; sliding scale   INSULIN PEN NEEDLE (B-D ULTRAFINE III SHORT PEN) 31G X 8 MM MISC    See admin instructions.   LORAZEPAM (ATIVAN) 0.5 MG TABLET    Take 0.5 mg by mouth every 8 (eight) hours as needed.   MUPIROCIN OINTMENT (BACTROBAN) 2 %    Apply 1 application topically 2 (two) times daily as needed for wound care.  Modified Medications   No medications on file  Discontinued Medications   No medications on file      Past Medical History:  Diagnosis Date  . Cancer ([place])    skin  . Coronary artery disease   . Diabetes mellitus   . Diabetic retinopathy   . Dyslipidemia   . Family history of adverse reaction to anesthesia    pts daughter vomits and has very difficult time to awaken  . H/O: CVA (cardiovascular accident)   . Hearing loss   . Heart murmur   . History of kidney stones   . Hypertension   . Stroke [facility]) 03/2020   x5    Social History   Tobacco Use  . Smoking status: Never Smoker  . Smokeless tobacco: Never Used  Vaping Use  . Vaping Use: Never used  Substance Use Topics  . Alcohol use: No  . Drug use: Never    Family History  Problem Relation Age of Onset  . Prostate cancer Other     Allergies  Allergen Reactions  . Codeine     Syncope     Review of Systems  Unable to perform ROS: Mental acuity      OBJECTIVE:    Vitals:   12/14/20 1056  BP: (!) 171/78  Pulse: 66  Weight: 105 lb (47.6 kg)     Body mass index is 18.02 kg/m.  Physical Exam Constitutional:      Comments: Elderly, thin appearing woman sitting in wheelchair, no acute distress  HENT:     Head: Normocephalic and atraumatic.     Comments: She is wearing a facemask. Pulmonary:     Effort: Pulmonary effort is normal. No respiratory distress.  Abdominal:     General: There is no distension.     Palpations: Abdomen is soft.     Tenderness: There is no abdominal tenderness.  Musculoskeletal:     Cervical back: Normal range of motion and neck supple.   Neurological:     Comments: She is lethargic, arouses to voice and is able to say a few words but then seems to drift back off.  She reports feeling okay otherwise.      Labs and Microbiology:  CBC Latest Ref Rng & Units 11/30/2020 11/29/2020 11/28/2020  WBC 4.0 - 10.5 K/uL 9.5 8.4 9.2  Hemoglobin 12.0 -  15.0 g/dL 10.6(L) 9.4(L) 9.4(L)  Hematocrit 36.0 - 46.0 % 32.8(L) 28.9(L) 29.6(L)  Platelets 150 - 400 K/uL 287 309 329   CMP Latest Ref Rng & Units 11/30/2020 11/29/2020 11/28/2020  Glucose 70 - 99 mg/dL 124(H) 114(H) 179(H)  BUN 8 - 23 mg/dL 13 9 9   Creatinine 0.44 - 1.00 mg/dL 0.79 0.61 0.54  Sodium 135 - 145 mmol/L 139 139 140  Potassium 3.5 - 5.1 mmol/L 3.9 3.5 3.4(L)  Chloride 98 - 111 mmol/L 108 106 109  CO2 22 - 32 mmol/L 23 24 22   Calcium 8.9 - 10.3 mg/dL 9.2 9.0 8.9  Total Protein 6.5 - 8.1 g/dL - - -  Total Bilirubin 0.3 - 1.2 mg/dL - - -  Alkaline Phos 38 - 126 U/L - - -  AST 15 - 41 U/L - - -  ALT 0 - 44 U/L - - -     No results found for this or any previous visit (from the past 240 hour(s)).    ASSESSMENT & PLAN:    1. Dementia  2. Urinary tract infection with ESBL E coli  Patient with worsening lethargy and concern for early sepsis related to ESBL E. coli urinary tract infection.  Discussed with patient, her daughter, and granddaughter regarding PICC line placement and IV therapy likely would be delayed over the weekend.  Potentially could have started her on nitrofurantoin however would have some concern regarding this medication given her age and dementia.  Given how quickly her mother has declined, decision was made to have her proceed for admission and to initiate IV antibiotics with ertapenem to treat her ESBL infection.    I do not feel strongly that she has been dealing with recurrent infectious issues as an explanation for her recurrent delirium symptoms given that there has been no objective findings of ongoing infection.  I think it is better to treat her  through this current episode.  Possibly could consider suppressive antibiotics if recurrent urinary tract infections were suspected, however, recent outpatient urology cultures have been negative until this most recent one from this week.    [facility] 12/14/2020, 12:13 PM  I spent 60 minutes dedicated to the care of this patient on the date of this encounter to include pre-visit review of records, face-to-face time with the patient discussing ESBL UTI, dementia, and post-visit ordering of testing.

## 2020-12-14 NOTE — H&P (Signed)
History and Physical    [name] GHW:[id number] DOB: 07-Apr-1929 DOA: 12/14/2020  PCP: Vicenta Aly, [place]  Patient coming from: ID office  I have personally briefly reviewed patient's old medical records in [place]  Chief Complaint: AMS   HPI: [name] is a 87 y.o. female with medical history significant for recurrent UTI, chronic bladder outlet obstruction with large diverticulum s/p stent placement on 2/7,chronic atrial fibrillation , severe aortic stenosis/moderate mitral regurgitation, HTN, CVA, DM, HLD who was brought in by family for altered mental status.  Pt does not know why she is here. No family at bedside.   Pt was recently admitted on 2/3-2/11 for recurrent UTI and chronic bladder outlet obstruction with large diverticulum.  She underwent cystoscopy and stent placement by urology on 2/7.  Per documentation, she had urine cultures from urology office on 1/31 and 1/6 with no growth on cath urine sample but had ESBL E. coli from primary care office from voided urine prior to admission.  She was treated with ceftriaxone but ultimately her urine culture from that hospitalization was without any growth.  Pt presented to ID clinic today for follow up of ESBL UTI with increase lethargy. Per ID HPI, patient presented to urology about 3 days ago for stent removal but her urine looked infected and stent was not removed at that time.  She had urine cultures that came back positive with 50,000 colonies of ESBL E. coli.  She was placed on Augmentin prior to susceptibility report and had plans for PICC line placement and meropenem.  She was also shown to have susceptibility to nitrofurantoin but ID had concerns regarding this medication given her age and dementia.  The then advised the family to have her be have her be admitted with plans to treat with IV ertapenem for ESBL infection.  ED Course: She was afebrile, mildly hypertensive. CBC showed stable chronic anemia  with hemoglobin of 11.  Hypokalemia of 3.4.  Glucose mildly elevated 152.  Catheterized urine sample was obtained showing large leukocyte negative nitrite, few bacteria and greater than 50 WBC.  Patient was started on IV meropenem by pharmacy as we do not have to go to [facility] here.  Review of Systems: Unable to obtain since pt unable to provide any hx   Past Medical History:  Diagnosis Date  . Cancer ([place])    skin  . Coronary artery disease   . Diabetes mellitus   . Diabetic retinopathy   . Dyslipidemia   . Family history of adverse reaction to anesthesia    pts daughter vomits and has very difficult time to awaken  . H/O: CVA (cardiovascular accident)   . Hearing loss   . Heart murmur   . History of kidney stones   . Hypertension   . Stroke [facility]) 03/2020   x5    Past Surgical History:  Procedure Laterality Date  . CESAREAN SECTION    . CYSTOSCOPY WITH RETROGRADE PYELOGRAM, URETEROSCOPY AND STENT PLACEMENT Right 11/26/2020   Procedure: CYSTOSCOPY WITH RIGHT RETROGRADE PYELOGRAM, RIGHT URETEROSCOPY AND PYELOSCOPY, RIGHT STENT PLACEMENT, FULGERATION OF RIGHT URETER AND BLADDER;  Surgeon: Remi Haggard, MD;  Location: [facility];  Service: Urology;  Laterality: Right;  . DILATION AND CURETTAGE OF UTERUS    . EYE SURGERY Bilateral   . hysterectomy - unknown type    . MASTOIDECTOMY     as a child     reports that she has never smoked. She has never used smokeless tobacco.  She reports that she does not drink alcohol and does not use drugs. Social History  Allergies  Allergen Reactions  . Codeine     Syncope     Family History  Problem Relation Age of Onset  . Prostate cancer Other      Prior to Admission medications   Medication Sig Start Date End Date Taking? Authorizing Provider  aspirin 81 MG tablet Take 1 tablet (81 mg total) by mouth at bedtime. RESUME ON 12/03/20 IF NO BLOOD IN THE URINE 12/03/20   Bonnielee Haff, MD  citalopram (CELEXA) 20 MG tablet Take 20 mg by  mouth at bedtime.    [provider]  clopidogrel (PLAVIX) 75 MG tablet Take 1 tablet (75 mg total) by mouth daily. RESUME ON 12/03/20 IF NO BLOOD IN THE URINE 12/03/20   Bonnielee Haff, MD  CONTOUR NEXT TEST test strip 1 each 3 (three) times daily. 12/05/20   [provider]  CRANBERRY PO Take 4,200 mg by mouth 2 (two) times daily.    [provider]  donepezil (ARICEPT) 10 MG tablet Take 10 mg by mouth at bedtime.    [provider]  fenofibrate 160 MG tablet Take 160 mg by mouth at bedtime.    [provider]  furosemide (LASIX) 20 MG tablet Take 20 mg by mouth daily.    [provider]  insulin NPH-insulin regular (NOVOLIN 70/30) (70-30) 100 UNIT/ML injection Inject 1-7 Units into the skin 2 (two) times daily as needed (high blood sugar). If blood sugar is over 150 takes 1 unit ; sliding scale    [provider]  Insulin Pen Needle (B-D ULTRAFINE III SHORT PEN) 31G X 8 MM MISC See admin instructions. 12/13/20   [provider]  LORazepam (ATIVAN) 0.5 MG tablet Take 0.5 mg by mouth every 8 (eight) hours as needed. 12/05/20   [provider]  mupirocin ointment (BACTROBAN) 2 % Apply 1 application topically 2 (two) times daily as needed for wound care. 09/25/20   [provider]    Physical Exam: Vitals:   12/14/20 1930 12/14/20 2000 12/14/20 2030 12/14/20 2131  BP: (!) 145/61 (!) 155/66 (!) 153/54 (!) 142/70  Pulse: 67 66 68 69  Resp: 18 20 16 18   Temp:    98.1 F (36.7 C)  TempSrc:    Axillary  SpO2: 96% 97% 97% 97%    Constitutional: NAD, calm, comfortable, thin cachectic appearing elderly female lying in bed with left hand in decorticate positioning Vitals:   12/14/20 1930 12/14/20 2000 12/14/20 2030 12/14/20 2131  BP: (!) 145/61 (!) 155/66 (!) 153/54 (!) 142/70  Pulse: 67 66 68 69  Resp: 18 20 16 18   Temp:    98.1 F (36.7 C)  TempSrc:    Axillary  SpO2: 96% 97% 97% 97%   Eyes: PERRL, slight  clouding of the conjunctiva ENMT: Mucous membranes are moist.  Neck: normal, supple Respiratory: clear to auscultation bilaterally, no wheezing, no crackles. Normal respiratory effort. No accessory muscle use.  Cardiovascular: Regular rate and rhythm, no murmurs / rubs / gallops. No extremity edema. 2+ pedal pulses.  Abdomen: no tenderness, no masses palpated. . Bowel sounds positive.  Musculoskeletal: no clubbing / cyanosis. No joint deformity upper and lower extremities.  Skin: no rashes, lesions, ulcers. No induration Neurologic: Alert but oriented only to self. Able to move all extremities on command and answers questions appropriately but usually answers "I do not know." Weak hand grip.  Psychiatric: Alert  and oriented only to self     Labs on Admission: I have personally reviewed following labs and imaging studies  CBC: Recent Labs  Lab 12/14/20 1248  WBC 6.6  NEUTROABS 4.0  HGB 11.0*  HCT 38.0  MCV 98.2  PLT 527   Basic Metabolic Panel: Recent Labs  Lab 12/14/20 1248  NA 142  K 3.4*  CL 107  CO2 25  GLUCOSE 152*  BUN 16  CREATININE 0.73  CALCIUM 9.8   GFR: Estimated Creatinine Clearance: 35.8 mL/min (by C-G formula based on SCr of 0.73 mg/dL). Liver Function Tests: Recent Labs  Lab 12/14/20 1248  AST 16  ALT 11  ALKPHOS 44  BILITOT 0.5  PROT 6.3*  ALBUMIN 2.8*   No results for input(s): LIPASE, AMYLASE in the last 168 hours. No results for input(s): AMMONIA in the last 168 hours. Coagulation Profile: No results for input(s): INR, PROTIME in the last 168 hours. Cardiac Enzymes: No results for input(s): CKTOTAL, CKMB, CKMBINDEX, TROPONINI in the last 168 hours. BNP (last 3 results) No results for input(s): PROBNP in the last 8760 hours. HbA1C: No results for input(s): HGBA1C in the last 72 hours. CBG: Recent Labs  Lab 12/14/20 2142  GLUCAP 152*   Lipid Profile: No results for input(s): CHOL, HDL, LDLCALC, TRIG, CHOLHDL, LDLDIRECT in the last  72 hours. Thyroid Function Tests: No results for input(s): TSH, T4TOTAL, FREET4, T3FREE, THYROIDAB in the last 72 hours. Anemia Panel: No results for input(s): VITAMINB12, FOLATE, FERRITIN, TIBC, IRON, RETICCTPCT in the last 72 hours. Urine analysis:    Component Value Date/Time   COLORURINE AMBER (A) 12/14/2020 1949   APPEARANCEUR TURBID (A) 12/14/2020 1949   LABSPEC 1.011 12/14/2020 1949   PHURINE 6.0 12/14/2020 1949   GLUCOSEU NEGATIVE 12/14/2020 1949   HGBUR SMALL (A) 12/14/2020 1949   BILIRUBINUR NEGATIVE 12/14/2020 1949   KETONESUR NEGATIVE 12/14/2020 1949   PROTEINUR 100 (A) 12/14/2020 1949   UROBILINOGEN 0.2 05/26/2008 0817   NITRITE NEGATIVE 12/14/2020 1949   LEUKOCYTESUR LARGE (A) 12/14/2020 1949    Radiological Exams on Admission: No results found.    Assessment/Plan  Acute metabolic encephalopathy/Hx of dementia  pt sent over by ID for concerns of possible recurrent UTI. Pt had recent admission for recurrent UTI and chronic bladder outlet obstruction with large diverticulum.  She underwent cystoscopy and right stent placement on 2/7. Reportedly recently had positive ESBL E. coli outpatient with urology and had plans for PICC line placement with start of IV meropenem. Will start IV ertapenem here assisted only formulation we have.  Catheterized urine obtained showing large leukocyte, negative nitrite, few bacteria and greater than 50 WBC.  Urine culture pending. No leukocytosis on CBC.  No other obvious source of infection.  Chronic atrial fibrillation  Rate controlled  severe aortic stenosis/moderate MR Reportedly patient is a poor candidate for surgical intervention  Type 2 diabetes HbA1C 7.5 on 11/23/20 Sensitive sliding scale   Normocytic anemia stable and chronic   Hypokalemia  supplement  Hx of CVA continue Plavix   Dementia  Baseline oriented to self and family, not to time or place. Pt also legally blind and has severe hearing impairment  DVT  prophylaxis:.Lovenox Code Status: Partial Code- no CPR- confirmed with daughter Family Communication: No family at bedside. Plan discussed with daughter over the phone.  disposition Plan: Home with observation Consults called:  Admission status: Observation  Level of care: Telemetry Medical  Status is: Observation  The patient remains OBS appropriate and  will d/c before 2 midnights.  Dispo: The patient is from: Home              Anticipated d/c is to: Home              Patient currently is not medically stable to d/c.   Difficult to place patient No         Orene Desanctis DO [facility]   If 7PM-7AM, please contact night-coverage [email or web address]   12/14/2020, 9:49 PM

## 2020-12-14 NOTE — Progress Notes (Signed)
Pharmacy Antibiotic Note  Rebecca Pruitt is a 87 y.o. female admitted on 12/14/2020 with UTI.  Pharmacy has been consulted for Meropenem dosing.  Plan: - Meropenem 1g IV q12h  - Monitor patient's renal function and urine output      Temp (24hrs), Avg:99.2 F (37.3 C), Min:98.7 F (37.1 C), Max:100.1 F (37.8 C)  Recent Labs  Lab 12/14/20 1248 12/14/20 1856  WBC 6.6  --   CREATININE 0.73  --   LATICACIDVEN  --  1.2    Estimated Creatinine Clearance: 35.8 mL/min (by C-G formula based on SCr of 0.73 mg/dL).    Allergies  Allergen Reactions  . Codeine     Syncope     Antimicrobials this admission: 2/25 Meropenem >>   Dose adjustments this admission:   Microbiology results:  pending  Thank you for allowing pharmacy to be a part of this patient's care.  Duanne Limerick PharmD. BCPS 12/14/2020 8:13 PM

## 2020-12-14 NOTE — ED Triage Notes (Addendum)
Pt here with family with reports of recurrent infections. Seen at ID today and was sent here for Picc line and IV abx. Family reports pt has been declining over the last 2 weeks. Reports increased fatigue and weakness.

## 2020-12-14 NOTE — Patient Instructions (Signed)
Thank you for coming to see me today. It was a pleasure seeing you.  To Do: Marland Kitchen Go to the [facility] ED to have your mom admitted given her UTI with ESBL E coli and decline that you have noticed in the past couple days.  They will be able to get her started on IV antibiotics  If you have any questions or concerns, please do not hesitate to call the office at [phone].  Take Care,   Jule Ser

## 2020-12-14 NOTE — Progress Notes (Signed)
Patient transported to ED due to urology diagnosis of UTI with esbl and e.coli. Patient needs IV abx and PICC line placement plus assessment due to increased and rapid decline per daughter.   Alicha Raspberry Lorita Officer, RN

## 2020-12-15 ENCOUNTER — Encounter (HOSPITAL_COMMUNITY): Payer: Self-pay | Admitting: Family Medicine

## 2020-12-15 DIAGNOSIS — I484 Atypical atrial flutter: Secondary | ICD-10-CM | POA: Diagnosis present

## 2020-12-15 DIAGNOSIS — I5032 Chronic diastolic (congestive) heart failure: Secondary | ICD-10-CM | POA: Diagnosis present

## 2020-12-15 DIAGNOSIS — R627 Adult failure to thrive: Secondary | ICD-10-CM | POA: Diagnosis present

## 2020-12-15 DIAGNOSIS — Q248 Other specified congenital malformations of heart: Secondary | ICD-10-CM | POA: Diagnosis not present

## 2020-12-15 DIAGNOSIS — I110 Hypertensive heart disease with heart failure: Secondary | ICD-10-CM | POA: Diagnosis present

## 2020-12-15 DIAGNOSIS — I2510 Atherosclerotic heart disease of native coronary artery without angina pectoris: Secondary | ICD-10-CM | POA: Diagnosis present

## 2020-12-15 DIAGNOSIS — I350 Nonrheumatic aortic (valve) stenosis: Secondary | ICD-10-CM | POA: Diagnosis not present

## 2020-12-15 DIAGNOSIS — Z8673 Personal history of transient ischemic attack (TIA), and cerebral infarction without residual deficits: Secondary | ICD-10-CM | POA: Diagnosis not present

## 2020-12-15 DIAGNOSIS — I482 Chronic atrial fibrillation, unspecified: Secondary | ICD-10-CM | POA: Diagnosis present

## 2020-12-15 DIAGNOSIS — E785 Hyperlipidemia, unspecified: Secondary | ICD-10-CM | POA: Diagnosis present

## 2020-12-15 DIAGNOSIS — D649 Anemia, unspecified: Secondary | ICD-10-CM | POA: Diagnosis present

## 2020-12-15 DIAGNOSIS — Z20822 Contact with and (suspected) exposure to covid-19: Secondary | ICD-10-CM | POA: Diagnosis present

## 2020-12-15 DIAGNOSIS — I4891 Unspecified atrial fibrillation: Secondary | ICD-10-CM | POA: Diagnosis not present

## 2020-12-15 DIAGNOSIS — N390 Urinary tract infection, site not specified: Secondary | ICD-10-CM | POA: Diagnosis present

## 2020-12-15 DIAGNOSIS — H9190 Unspecified hearing loss, unspecified ear: Secondary | ICD-10-CM | POA: Diagnosis present

## 2020-12-15 DIAGNOSIS — R1310 Dysphagia, unspecified: Secondary | ICD-10-CM | POA: Diagnosis present

## 2020-12-15 DIAGNOSIS — Z681 Body mass index (BMI) 19 or less, adult: Secondary | ICD-10-CM | POA: Diagnosis not present

## 2020-12-15 DIAGNOSIS — R64 Cachexia: Secondary | ICD-10-CM | POA: Diagnosis present

## 2020-12-15 DIAGNOSIS — R471 Dysarthria and anarthria: Secondary | ICD-10-CM | POA: Diagnosis present

## 2020-12-15 DIAGNOSIS — G9341 Metabolic encephalopathy: Secondary | ICD-10-CM | POA: Diagnosis not present

## 2020-12-15 DIAGNOSIS — F0150 Vascular dementia without behavioral disturbance: Secondary | ICD-10-CM | POA: Diagnosis present

## 2020-12-15 DIAGNOSIS — Z515 Encounter for palliative care: Secondary | ICD-10-CM | POA: Diagnosis not present

## 2020-12-15 DIAGNOSIS — H548 Legal blindness, as defined in USA: Secondary | ICD-10-CM | POA: Diagnosis present

## 2020-12-15 DIAGNOSIS — Z1612 Extended spectrum beta lactamase (ESBL) resistance: Secondary | ICD-10-CM | POA: Diagnosis present

## 2020-12-15 DIAGNOSIS — I313 Pericardial effusion (noninflammatory): Secondary | ICD-10-CM | POA: Diagnosis present

## 2020-12-15 DIAGNOSIS — G928 Other toxic encephalopathy: Secondary | ICD-10-CM | POA: Diagnosis present

## 2020-12-15 DIAGNOSIS — Z7189 Other specified counseling: Secondary | ICD-10-CM | POA: Diagnosis not present

## 2020-12-15 DIAGNOSIS — I080 Rheumatic disorders of both mitral and aortic valves: Secondary | ICD-10-CM | POA: Diagnosis present

## 2020-12-15 DIAGNOSIS — Z1624 Resistance to multiple antibiotics: Secondary | ICD-10-CM | POA: Diagnosis present

## 2020-12-15 DIAGNOSIS — E876 Hypokalemia: Secondary | ICD-10-CM | POA: Diagnosis present

## 2020-12-15 LAB — BASIC METABOLIC PANEL
Anion gap: 9 (ref 5–15)
BUN: 16 mg/dL (ref 8–23)
CO2: 26 mmol/L (ref 22–32)
Calcium: 9.4 mg/dL (ref 8.9–10.3)
Chloride: 103 mmol/L (ref 98–111)
Creatinine, Ser: 0.58 mg/dL (ref 0.44–1.00)
GFR, Estimated: >60 mL/min (ref >60)
Glucose, Bld: 122 mg/dL — ABNORMAL HIGH (ref 70–99)
Potassium: 3.2 mmol/L — ABNORMAL LOW (ref 3.5–5.1)
Sodium: 138 mmol/L (ref 135–145)

## 2020-12-15 LAB — CBC
HCT: 30.2 % — ABNORMAL LOW (ref 36.0–46.0)
Hemoglobin: 9.8 g/dL — ABNORMAL LOW (ref 12.0–15.0)
MCH: 29.3 pg (ref 26.0–34.0)
MCHC: 32.5 g/dL (ref 30.0–36.0)
MCV: 90.1 fL (ref 80.0–100.0)
Platelets: 376 10*3/uL (ref 150–400)
RBC: 3.35 MIL/uL — ABNORMAL LOW (ref 3.87–5.11)
RDW: 14.6 % (ref 11.5–15.5)
WBC: 6.8 10*3/uL (ref 4.0–10.5)
nRBC: 0 % (ref 0.0–0.2)

## 2020-12-15 LAB — GLUCOSE, CAPILLARY
Glucose-Capillary: 108 mg/dL — ABNORMAL HIGH (ref 70–99)
Glucose-Capillary: 119 mg/dL — ABNORMAL HIGH (ref 70–99)
Glucose-Capillary: 142 mg/dL — ABNORMAL HIGH (ref 70–99)
Glucose-Capillary: 278 mg/dL — ABNORMAL HIGH (ref 70–99)

## 2020-12-15 MED ORDER — POTASSIUM CHLORIDE CRYS ER 20 MEQ PO TBCR
40.0000 meq | EXTENDED_RELEASE_TABLET | Freq: Once | ORAL | Status: AC
  Administered 2020-12-15: 40 meq via ORAL
  Filled 2020-12-15: qty 2

## 2020-12-15 MED ORDER — MELATONIN 3 MG PO TABS
3.0000 mg | ORAL_TABLET | Freq: Every evening | ORAL | Status: DC | PRN
  Administered 2020-12-17 – 2020-12-20 (×3): 3 mg via ORAL
  Filled 2020-12-15 (×3): qty 1

## 2020-12-15 MED ORDER — POTASSIUM CHLORIDE 10 MEQ/100ML IV SOLN
10.0000 meq | Freq: Once | INTRAVENOUS | Status: AC
  Administered 2020-12-15: 10 meq via INTRAVENOUS
  Filled 2020-12-15: qty 100

## 2020-12-15 MED ORDER — LORAZEPAM 0.5 MG PO TABS: 0.5000 mg | ORAL_TABLET | Freq: Three times a day (TID) | ORAL | Status: DC | PRN

## 2020-12-15 MED ORDER — FENOFIBRATE 160 MG PO TABS
160.0000 mg | ORAL_TABLET | Freq: Every day | ORAL | Status: DC
Start: 2020-12-15 — End: 2020-12-23
  Administered 2020-12-15 – 2020-12-22 (×8): 160 mg via ORAL
  Filled 2020-12-15 (×8): qty 1

## 2020-12-15 NOTE — Evaluation (Signed)
Clinical/Bedside Swallow Evaluation Patient Details  Name: Rebecca Pruitt MRN: [id number] Date of Birth: 22-Aug-1929  Today's Date: 12/15/2020 Time: SLP Start Time (ACUTE ONLY): 1515 SLP Stop Time (ACUTE ONLY): 1535 SLP Time Calculation (min) (ACUTE ONLY): 20 min  Past Medical History:  Past Medical History:  Diagnosis Date  . Cancer ([place])    skin  . Coronary artery disease   . Diabetes mellitus   . Diabetic retinopathy   . Dyslipidemia   . Family history of adverse reaction to anesthesia    pts daughter vomits and has very difficult time to awaken  . H/O: CVA (cardiovascular accident)   . Hearing loss   . Heart murmur   . History of kidney stones   . Hypertension   . Stroke [facility]) 03/2020   x5   Past Surgical History:  Past Surgical History:  Procedure Laterality Date  . CESAREAN SECTION    . CYSTOSCOPY WITH RETROGRADE PYELOGRAM, URETEROSCOPY AND STENT PLACEMENT Right 11/26/2020   Procedure: CYSTOSCOPY WITH RIGHT RETROGRADE PYELOGRAM, RIGHT URETEROSCOPY AND PYELOSCOPY, RIGHT STENT PLACEMENT, FULGERATION OF RIGHT URETER AND BLADDER;  Surgeon: Remi Haggard, MD;  Location: [facility];  Service: Urology;  Laterality: Right;  . DILATION AND CURETTAGE OF UTERUS    . EYE SURGERY Bilateral   . hysterectomy - unknown type    . MASTOIDECTOMY     as a child   HPI:  Rebecca Pruitt is a 87 y.o. female with medical history significant for recurrent UTI, chronic bladder outlet obstruction with large diverticulum s/p stent placement on 2/7,chronic atrial fibrillation , severe aortic stenosis/moderate mitral regurgitation, HTN, CVA, DM, HLD who was brought in by family for altered mental status.   Assessment / Plan / Recommendation Clinical Impression  Patient presents with a mild oropharyngeal dysphagia with likely impact from current state of fatigue/lethargy. Patient was alert enough to safely consume PO's however she is not initating to request, etc and requires cues to redirect.  Her daughter was in room with her and was feeding her lunch (puree solids, thin liquids). Patient exhibited prolonged oral transit with puree solids and gelatin, and suspected delayed swallow initaition with thin liquids via straw sips, however no coughing, throat clearing and voice remained clear. Patient appears to be tolerating Dys 1, thin liquids diet and SLP plans to see patient again in next 1-2 dates to ensure she continues to tolerate PO's and determine readiness to trial upgraded solids. SLP Visit Diagnosis: Dysphagia, unspecified (R13.10)    Aspiration Risk  Mild aspiration risk    Diet Recommendation Dysphagia 1 (Puree);Thin liquid   Liquid Administration via: Cup;Straw Medication Administration: Crushed with puree Supervision: Full supervision/cueing for compensatory strategies;Staff to assist with self feeding Compensations: Minimize environmental distractions;Small sips/bites;Slow rate Postural Changes: Seated upright at 90 degrees    Other  Recommendations Oral Care Recommendations: Oral care BID;Staff/trained caregiver to provide oral care   Follow up Recommendations Other (comment) (TBD)      Frequency and Duration min 1 x/week  1 week       Prognosis Prognosis for Safe Diet Advancement: Good      Swallow Study   General Date of Onset: 12/14/20 HPI: Rebecca Pruitt is a 87 y.o. female with medical history significant for recurrent UTI, chronic bladder outlet obstruction with large diverticulum s/p stent placement on 2/7,chronic atrial fibrillation , severe aortic stenosis/moderate mitral regurgitation, HTN, CVA, DM, HLD who was brought in by family for altered mental status. Type of Study: Bedside Swallow Evaluation Previous Swallow  Assessment: None found Diet Prior to this Study: Dysphagia 1 (puree);Thin liquids Temperature Spikes Noted: Yes (101 on 2/25) History of Recent Intubation: No Behavior/Cognition: Alert;Cooperative;Pleasant mood;Lethargic/Drowsy Oral  Cavity Assessment: Within Functional Limits Oral Care Completed by SLP: Recent completion by staff Oral Cavity - Dentition: Adequate natural dentition;Dentures, top Vision: Impaired for self-feeding (blind) Self-Feeding Abilities: Total assist Patient Positioning: Upright in bed Baseline Vocal Quality: Low vocal intensity Volitional Cough: Weak Volitional Swallow: Able to elicit    Oral/Motor/Sensory Function Overall Oral Motor/Sensory Function: Within functional limits   Ice Chips     Thin Liquid Thin Liquid: Impaired Pharyngeal  Phase Impairments: Suspected delayed Swallow    Nectar Thick     Honey Thick     Puree Puree: Impaired Oral Phase Functional Implications: Prolonged oral transit Pharyngeal Phase Impairments: Suspected delayed Swallow   Solid     Solid: Impaired (gelatin) Oral Phase Impairments: Impaired mastication Oral Phase Functional Implications: Prolonged oral transit      Sonia Baller, MA, CCC-SLP Speech Therapy [facility]

## 2020-12-15 NOTE — Progress Notes (Addendum)
PROGRESS NOTE  [name]  DOB: 11-Aug-1929  PCP: Vicenta Aly, [place] ZWC:[id number]  DOA: 12/14/2020  LOS: 0 days   Chief Complaint  Patient presents with  . Recurrent UTI  . Weakness   Brief narrative: Rebecca Pruitt is a 87 y.o. female with PMH significant for DM2, HTN, HLD, CVA, chronic A. fib, severe aortic stenosis, moderate mitral regurgitation, chronic bladder outlet obstruction with large diverticulum status post stent placement on 2/7, recurrent UTI.  Patient was brought to the ED by family on 2/25 for altered mental status.  2/3-2/11, patient was hospitalized for recurrent UTI and chronic bladder outlet obstruction with large diverticulum.   2/7, she underwent cystoscopy and stent placement by urology. 2/22, patient was at urologist office for stent removal but her urine looked infected and the stent was not removed.  She was sent home on empiric treatment with Augmentin. Urine culture came back positive with 50,000 colonies of ESBL E. Coli. 2/25, patient presented to ID clinic for follow-up for ESBL UTI, complaint of increased lethargy.  ID Dr. Juleen China sent her to ED for admission and treatment with IV ertapenem.  In the ED, patient had a temperature of 100.1, blood pressure elevated to 160s Labs with hemoglobin of 11, potassium low at 3.4, lactic acid normal at 1.2, urinalysis with turbid amber color urine, large amount of leukocytes, few bacteria.  Subjective: Patient was seen and examined this morning. Elderly Caucasian female.  Opens eyes on touch.  Unable to follow any other commands or respond verbally. Chart reviewed. Labs from this morning with stable normal WBC count, hemoglobin down to 9.8, potassium low at 3.2  Assessment/Plan: ESBL UTI -History of recurrent UTI related to chronic bladder obstruction, recent instrumentation -Presented with increased lethargy.  Low-grade fever but normal WC count and normal lactic acid level. -Per ID recommendation,  patient is currently on IV meropenem -Urine culture sent Recent Labs  Lab 12/14/20 1248 12/14/20 1856 12/15/20 0315  WBC 6.6  --  6.8  LATICACIDVEN  --  1.2  --    Acute metabolic encephalopathy History of dementia -At baseline, patient is legally blind but is able to walk with a walker. At baseline, she is not oriented to date but usually oriented to place and person.  -Mental status worsened probably because of UTI.   -Continue supportive care for dementia.  Continue Celexa, Aricept and as needed Ativan  Chronic atrial fibrillation  History of CVA -Rate controlled -Continue Plavix, fenofibrate  Severe aortic stenosis/moderate MR -Reportedly patient is a poor candidate for surgical intervention  Type 2 diabetes -HbA1C 7.5 on 11/23/20 -Home meds include Novolin 70/30 twice a day as needed only if blood sugar is more than 150 -Currently on sliding scale insulin only. Recent Labs  Lab 12/14/20 2142 12/15/20 0905 12/15/20 1117  GLUCAP 152* 108* 119*   Normocytic anemia -stable and chronic  Recent Labs    11/26/20 0450 11/26/20 1544 11/28/20 0518 11/29/20 0537 11/30/20 0530 12/14/20 1248 12/15/20 0315  HGB 8.0*   < > 9.4* 9.4* 10.6* 11.0* 9.8*  MCV 95.6   < > 94.9 93.8 93.4 98.2 90.1  VITAMINB12 504  --   --   --   --   --   --   FOLATE 8.1  --   --   --   --   --   --   FERRITIN 23  --   --   --   --   --   --   TIBC  234*  --   --   --   --   --   --   IRON 30  --   --   --   --   --   --   RETICCTPCT 1.8  --   --   --   --   --   --    < > = values in this interval not displayed.   Hypokalemia  -Continue potassium supplement Recent Labs  Lab 12/14/20 1248 12/15/20 0315  K 3.4* 3.2*   Mobility: Unclear baseline functional status. Code Status:   Code Status: Partial Code  Nutritional status: There is no height or weight on file to calculate BMI.     Diet Order            DIET - DYS 1 Room service appropriate? Yes; Fluid consistency: Thin  Diet  effective now               RN to check with patient's daughter and restart her on previous diet.  Probably has poor oral intake, no need of cardiac or diabetic restriction.  DVT prophylaxis: enoxaparin (LOVENOX) injection 30 mg Start: 12/14/20 2030   Antimicrobials:  IV meropenem Fluid: None Consultants: ID Family Communication:  Not at bedside  Status is: Observation  The patient will require care spanning > 2 midnights and should be moved to inpatient because: Continues to need IV antibiotics  Dispo: The patient is from: Home              Anticipated d/c is to: Home              Patient currently is not medically stable to d/c.   Difficult to place patient No   Infusions:  . meropenem (MERREM) IV 1 g (12/15/20 1006)    Scheduled Meds: . clopidogrel  75 mg Oral Daily  . enoxaparin (LOVENOX) injection  30 mg Subcutaneous Q24H  . fenofibrate  160 mg Oral QHS  . insulin aspart  0-5 Units Subcutaneous QHS  . insulin aspart  0-9 Units Subcutaneous TID WC    Antimicrobials: Anti-infectives (From admission, onward)   Start     Dose/Rate Route Frequency Ordered Stop   12/14/20 2000  meropenem (MERREM) 1 g in sodium chloride 0.9 % 100 mL IVPB        1 g 200 mL/hr over 30 Minutes Intravenous Every 12 hours 12/14/20 1952        PRN meds:    Objective: Vitals:   12/15/20 0630 12/15/20 0908  BP: 138/62 132/66  Pulse: 69 71  Resp: 17 18  Temp: (!) 97.5 F (36.4 C) 98.2 F (36.8 C)  SpO2: 98% 98%    Intake/Output Summary (Last 24 hours) at 12/15/2020 1155 Last data filed at 12/15/2020 1016 Gross per 24 hour  Intake 141.19 ml  Output 925 ml  Net -783.81 ml   There were no vitals filed for this visit. Weight change:  There is no height or weight on file to calculate BMI.   Physical Exam: General exam: Pleasant elderly Caucasian female with dementia.  Not in distress Skin: No rashes, lesions or ulcers. HEENT: Atraumatic, normocephalic, no obvious  bleeding Lungs: Clear to auscultation bilaterally CVS: Regular rate and rhythm, no murmur GI/Abd soft, nontender, nondistended, bowel sound present CNS: Opens eyes on touch, unable to follow other command.  No verbal response Psychiatry: Depressed look Extremities: No pedal edema, no calf tenderness  Data Review: I have personally reviewed the  laboratory data and studies available.  Recent Labs  Lab 12/14/20 1248 12/15/20 0315  WBC 6.6 6.8  NEUTROABS 4.0  --   HGB 11.0* 9.8*  HCT 38.0 30.2*  MCV 98.2 90.1  PLT 369 376   Recent Labs  Lab 12/14/20 1248 12/15/20 0315  NA 142 138  K 3.4* 3.2*  CL 107 103  CO2 25 26  GLUCOSE 152* 122*  BUN 16 16  CREATININE 0.73 0.58  CALCIUM 9.8 9.4    F/u labs ordered Unresulted Labs (From admission, onward)          Start     Ordered   12/16/20 0500  CBC with Differential/Platelet  Daily,   R      12/15/20 1100   12/16/20 7998  Basic metabolic panel  Daily,   R      12/15/20 1100   12/14/20 1934  Culture, blood (routine x 2)  BLOOD CULTURE X 2,   STAT      12/14/20 1933   12/14/20 1901  Urine culture  ONCE - STAT,   STAT        12/14/20 1900          Signed, Terrilee Croak, MD [facility] 12/15/2020

## 2020-12-15 NOTE — Evaluation (Signed)
Physical Therapy Evaluation Patient Details Name: Rebecca Pruitt MRN: [id number] DOB: 1929-04-16 Today's Date: 12/15/2020   History of Present Illness  Rebecca Pruitt is a 87 y.o. female with medical history significant for recurrent UTI, chronic bladder outlet obstruction with large diverticulum s/p stent placement on 2/7,chronic atrial fibrillation , severe aortic stenosis/moderate mitral regurgitation, HTN, CVA, DM, HLD who was brought in by family for altered mental status.  Clinical Impression  Patient received in bed, daughter at bedside feeing patient. Patient/daughter agreeable to PT assessment. Patient is pleasantly confused. Alert not oriented. She is generally weak, requiring total assist to perform bed mobility and poor/zero sitting balance. Patient will continue to benefit from skilled PT while here to improve functional mobility, strength and safety for hopeful return home with daughter at discharge. At this point patient would benefit from higher level of care.          Follow Up Recommendations SNF;Supervision/Assistance - 24 hour    Equipment Recommendations  None recommended by PT    Recommendations for Other Services       Precautions / Restrictions Precautions Precautions: Fall Restrictions Weight Bearing Restrictions: No      Mobility  Bed Mobility Overal bed mobility: Needs Assistance Bed Mobility: Supine to Sit;Sit to Supine     Supine to sit: Total assist Sit to supine: Total assist   General bed mobility comments: no real initiation of movement to edge of bed.    Transfers                 General transfer comment: unable at this time due to poor sitting balance  Ambulation/Gait             General Gait Details: unable  Stairs            Wheelchair Mobility    Modified Rankin (Stroke Patients Only)       Balance Overall balance assessment: Needs assistance   Sitting balance-Leahy Scale: Zero Sitting balance -  Comments: no attempt to use UEs to assist with sitting balance. Unable to maintain sitting balance without max assist. Postural control: Posterior lean                                   Pertinent Vitals/Pain Pain Assessment: Faces Faces Pain Scale: Hurts a little bit Pain Location: left knee with movement Pain Descriptors / Indicators: Grimacing;Guarding;Moaning Pain Intervention(s): Monitored during session;Limited activity within patient's tolerance;Repositioned    Home Living Family/patient expects to be discharged to:: Private residence Living Arrangements: Children Available Help at Discharge: Family;Available 24 hours/day             Additional Comments: patient is pleasantly confused, unable to provide info, duaghter present who states her mother lives with her and she also has Rebecca Pruitt services. She walks with walker and assistance.    Prior Function Level of Independence: Needs assistance   Gait / Transfers Assistance Needed: pt reports her DTR walks with her while she uses her rollator "most of the time"     Comments: patient unable to provide history, pleasantly confused. Per chart DTR is patient's caregiver. Patient is legally blind     Hand Dominance   Dominant Hand: Left    Extremity/Trunk Assessment   Upper Extremity Assessment Upper Extremity Assessment: Generalized weakness RUE Deficits / Details: keeps hands in flexed posture. limited strength to raise arms. R weaker than left. ROM appears in normal limits.  Lower Extremity Assessment Lower Extremity Assessment: Generalized weakness RLE Deficits / Details: LEs appear to have normal rom, weak    Cervical / Trunk Assessment Cervical / Trunk Assessment: Kyphotic  Communication   Communication: HOH  Cognition Arousal/Alertness: Awake/alert Behavior During Therapy: WFL for tasks assessed/performed Overall Cognitive Status: Impaired/Different from baseline Area of Impairment:  Orientation;Awareness;Problem solving                 Orientation Level: Disoriented to;Time;Situation;Place   Memory: Decreased short-term memory Following Commands: Follows one step commands inconsistently;Follows one step commands with increased time   Awareness: Intellectual Problem Solving: Slow processing;Decreased initiation;Requires verbal cues;Requires tactile cues        General Comments      Exercises     Assessment/Plan    PT Assessment Patient needs continued PT services  PT Problem List Decreased strength;Decreased mobility;Decreased activity tolerance;Decreased balance;Decreased cognition;Decreased safety awareness       PT Treatment Interventions DME instruction;Therapeutic activities;Gait training;Therapeutic exercise;Functional mobility training;Balance training;Patient/family education    PT Goals (Current goals can be found in the Care Plan section)  Acute Rehab PT Goals Patient Stated Goal: to return to prior level of function PT Goal Formulation: With family Time For Goal Achievement: 12/28/20 Potential to Achieve Goals: Fair    Frequency Min 3X/week   Barriers to discharge        Co-evaluation               AM-PAC PT "6 Clicks" Mobility  Outcome Measure Help needed turning from your back to your side while in a flat bed without using bedrails?: Total Help needed moving from lying on your back to sitting on the side of a flat bed without using bedrails?: Total Help needed moving to and from a bed to a chair (including a wheelchair)?: Total Help needed standing up from a chair using your arms (e.g., wheelchair or bedside chair)?: Total Help needed to walk in hospital room?: Total Help needed climbing 3-5 steps with a railing? : Total 6 Click Score: 6    End of Session   Activity Tolerance: Patient limited by fatigue;Other (comment) (cognition) Patient left: in bed;with call bell/phone within reach;with family/visitor  present Nurse Communication: Mobility status PT Visit Diagnosis: Muscle weakness (generalized) (M62.81);Other abnormalities of gait and mobility (R26.89)    Time: 2542-7062 PT Time Calculation (min) (ACUTE ONLY): 23 min   Charges:   PT Evaluation $PT Eval Moderate Complexity: 1 Mod PT Treatments $Therapeutic Activity: 8-22 mins        Verona Hartshorn, PT, GCS 12/15/20,3:27 PM

## 2020-12-16 ENCOUNTER — Inpatient Hospital Stay (HOSPITAL_COMMUNITY): Payer: Medicare Other

## 2020-12-16 DIAGNOSIS — I482 Chronic atrial fibrillation, unspecified: Secondary | ICD-10-CM | POA: Diagnosis not present

## 2020-12-16 DIAGNOSIS — Z8673 Personal history of transient ischemic attack (TIA), and cerebral infarction without residual deficits: Secondary | ICD-10-CM | POA: Diagnosis not present

## 2020-12-16 LAB — URINE CULTURE: Culture: <10000 — AB

## 2020-12-16 LAB — LIPID PANEL
Cholesterol: 136 mg/dL (ref 0–200)
HDL: 39 mg/dL — ABNORMAL LOW (ref >40)
LDL Cholesterol: 84 mg/dL (ref 0–99)
Total CHOL/HDL Ratio: 3.5 RATIO
Triglycerides: 63 mg/dL (ref <150)
VLDL: 13 mg/dL (ref 0–40)

## 2020-12-16 LAB — GLUCOSE, CAPILLARY
Glucose-Capillary: 142 mg/dL — ABNORMAL HIGH (ref 70–99)
Glucose-Capillary: 257 mg/dL — ABNORMAL HIGH (ref 70–99)
Glucose-Capillary: 238 mg/dL — ABNORMAL HIGH (ref 70–99)
Glucose-Capillary: 226 mg/dL — ABNORMAL HIGH (ref 70–99)

## 2020-12-16 LAB — BASIC METABOLIC PANEL
Anion gap: 7 (ref 5–15)
BUN: 18 mg/dL (ref 8–23)
CO2: 27 mmol/L (ref 22–32)
Calcium: 9.8 mg/dL (ref 8.9–10.3)
Chloride: 107 mmol/L (ref 98–111)
Creatinine, Ser: 0.76 mg/dL (ref 0.44–1.00)
GFR, Estimated: >60 mL/min (ref >60)
Glucose, Bld: 156 mg/dL — ABNORMAL HIGH (ref 70–99)
Potassium: 4.2 mmol/L (ref 3.5–5.1)
Sodium: 141 mmol/L (ref 135–145)

## 2020-12-16 LAB — CBC WITH DIFFERENTIAL/PLATELET
Abs Immature Granulocytes: 0.01 10*3/uL (ref 0.00–0.07)
Basophils Absolute: 0 10*3/uL (ref 0.0–0.1)
Basophils Relative: 1 %
Eosinophils Absolute: 0.3 10*3/uL (ref 0.0–0.5)
Eosinophils Relative: 5 %
HCT: 31.2 % — ABNORMAL LOW (ref 36.0–46.0)
Hemoglobin: 9.6 g/dL — ABNORMAL LOW (ref 12.0–15.0)
Immature Granulocytes: 0 %
Lymphocytes Relative: 30 %
Lymphs Abs: 1.9 10*3/uL (ref 0.7–4.0)
MCH: 28.1 pg (ref 26.0–34.0)
MCHC: 30.8 g/dL (ref 30.0–36.0)
MCV: 91.2 fL (ref 80.0–100.0)
Monocytes Absolute: 0.9 10*3/uL (ref 0.1–1.0)
Monocytes Relative: 13 %
Neutro Abs: 3.3 10*3/uL (ref 1.7–7.7)
Neutrophils Relative %: 51 %
Platelets: 328 10*3/uL (ref 150–400)
RBC: 3.42 MIL/uL — ABNORMAL LOW (ref 3.87–5.11)
RDW: 14.3 % (ref 11.5–15.5)
WBC: 6.5 10*3/uL (ref 4.0–10.5)
nRBC: 0 % (ref 0.0–0.2)

## 2020-12-16 LAB — HEMOGLOBIN A1C
Hgb A1c MFr Bld: 6.6 % — ABNORMAL HIGH (ref 4.8–5.6)
Mean Plasma Glucose: 142.72 mg/dL

## 2020-12-16 MED ORDER — ENOXAPARIN SODIUM 40 MG/0.4ML ~~LOC~~ SOLN
40.0000 mg | SUBCUTANEOUS | Status: DC
  Administered 2020-12-16: 40 mg via SUBCUTANEOUS
  Filled 2020-12-16: qty 0.4

## 2020-12-16 MED ORDER — INSULIN GLARGINE 100 UNIT/ML ~~LOC~~ SOLN
5.0000 [IU] | Freq: Every day | SUBCUTANEOUS | Status: DC
  Administered 2020-12-16 – 2020-12-23 (×8): 5 [IU] via SUBCUTANEOUS
  Filled 2020-12-16 (×8): qty 0.05

## 2020-12-16 MED ORDER — ASPIRIN EC 81 MG PO TBEC
81.0000 mg | DELAYED_RELEASE_TABLET | Freq: Every day | ORAL | Status: DC
  Administered 2020-12-16 – 2020-12-17 (×2): 81 mg via ORAL
  Filled 2020-12-16 (×2): qty 1

## 2020-12-16 NOTE — Progress Notes (Signed)
PROGRESS NOTE  [name]  DOB: [birth year]  PCP: Vicenta Aly, [place] OBS:[id number]  DOA: 12/14/2020  LOS: 1 day   Chief Complaint  Patient presents with  . Recurrent UTI  . Weakness   Brief narrative: Rebecca Pruitt is a 87 y.o. female with PMH significant for DM2, HTN, HLD, CVA, chronic A. fib, severe aortic stenosis, moderate mitral regurgitation, chronic bladder outlet obstruction with large diverticulum status post stent placement on 2/7, recurrent UTI.  Patient was brought to the ED by family on 2/25 for altered mental status.  2/3-2/11, patient was hospitalized for recurrent UTI and chronic bladder outlet obstruction with large diverticulum.   2/7, she underwent cystoscopy and stent placement by urology. 2/22, patient was at urologist office for stent removal but her urine looked infected and the stent was not removed.  She was sent home on empiric treatment with Augmentin. Urine culture came back positive with 50,000 colonies of ESBL E. Coli. 2/25, patient presented to ID clinic for follow-up for ESBL UTI, complaint of increased lethargy.  ID Dr. Juleen China sent her to ED for admission and treatment with IV ertapenem.  In the ED, patient had a temperature of 100.1, blood pressure elevated to 160s Labs with hemoglobin of 11, potassium low at 3.4, lactic acid normal at 1.2, urinalysis with turbid amber color urine, large amount of leukocytes, few bacteria. Admitted to hospitalist service. See below for details  Subjective: Patient was seen and examined this morning. Elderly Caucasian female.   She was able to tell me her name today.  Knows that she is in the hospital.  This is a better mental status than yesterday.  Seen by speech therapist.  On dysphagia one diet.    Assessment/Plan: ESBL UTI -History of recurrent UTI related to chronic bladder obstruction, recent instrumentation -Presented with increased lethargy.  Low-grade fever but normal WBC count and normal  lactic acid level. -Per ID recommendation, patient is currently on IV meropenem -Urine culture report pending Recent Labs  Lab 12/14/20 1248 12/14/20 1856 12/15/20 0315 12/16/20 0434  WBC 6.6  --  6.8 6.5  LATICACIDVEN  --  1.2  --   --    Acute metabolic encephalopathy History of dementia -At baseline, patient is legally blind but is able to walk with a walker.  Per daughter, she code recognize her family members, oriented to place but not oriented to time -On admission, patient was altered probably because of UTI as well as from the effect of mood altering medications. -Today, patient is slightly more awake.  -Continue supportive care for dementia.  Currently on hold are Celexa, Aricept and as needed Ativan  Dysphagia -Speech therapy evaluation obtained.  Currently on dysphagia one diet. -Per daughter, prior to this admission, patient was able to tolerate regular consistency.  Expect improvement with resolution of infection and improvement in mental status.  Chronic atrial fibrillation  History of CVA -Rate controlled -Continue aspirin, Plavix, fenofibrate.  Severe aortic stenosis/moderate MR -Reportedly patient is a poor candidate for surgical intervention  Type 2 diabetes -HbA1C 7.5 on 11/23/20 -Home meds include Novolin 70/30 twice a day as needed only if blood sugar is more than 150 -Currently on sliding scale insulin only. -I would add Lantus 5 units daily this morning. Recent Labs  Lab 12/15/20 0905 12/15/20 1117 12/15/20 1606 12/15/20 1941 12/16/20 0636  GLUCAP 108* 119* 142* 278* 142*   Normocytic anemia -stable and chronic  Recent Labs    11/26/20 0450 11/26/20 1544 11/29/20 0537 11/30/20 0530  12/14/20 1248 12/15/20 0315 12/16/20 0434  HGB 8.0*   < > 9.4* 10.6* 11.0* 9.8* 9.6*  MCV 95.6   < > 93.8 93.4 98.2 90.1 91.2  VITAMINB12 504  --   --   --   --   --   --   FOLATE 8.1  --   --   --   --   --   --   FERRITIN 23  --   --   --   --   --   --    TIBC 234*  --   --   --   --   --   --   IRON 30  --   --   --   --   --   --   RETICCTPCT 1.8  --   --   --   --   --   --    < > = values in this interval not displayed.   Hypokalemia  -Potassium level improved today with replacement. Recent Labs  Lab 12/14/20 1248 12/15/20 0315 12/16/20 0434  K 3.4* 3.2* 4.2   Mobility: Unclear baseline functional status. Code Status:   Code Status: Partial Code  Nutritional status: There is no height or weight on file to calculate BMI.     Diet Order            DIET - DYS 1 Room service appropriate? No; Fluid consistency: Thin  Diet effective now                 DVT prophylaxis: enoxaparin (LOVENOX) injection 40 mg Start: 12/16/20 2030   Antimicrobials:  IV meropenem Fluid: None Consultants: ID Family Communication:  Not at bedside  Status is: Observation  The patient will require care spanning > 2 midnights and should be moved to inpatient because: Continues to need IV antibiotics  Dispo: The patient is from: Home              Anticipated d/c is to: Home              Patient currently is not medically stable to d/c.   Difficult to place patient No   Infusions:  . meropenem (MERREM) IV 1 g (12/16/20 0831)    Scheduled Meds: . aspirin  81 mg Oral QHS  . clopidogrel  75 mg Oral Daily  . enoxaparin (LOVENOX) injection  40 mg Subcutaneous Q24H  . fenofibrate  160 mg Oral QHS  . insulin aspart  0-5 Units Subcutaneous QHS  . insulin aspart  0-9 Units Subcutaneous TID WC  . insulin glargine  5 Units Subcutaneous Daily    Antimicrobials: Anti-infectives (From admission, onward)   Start     Dose/Rate Route Frequency Ordered Stop   12/14/20 2000  meropenem (MERREM) 1 g in sodium chloride 0.9 % 100 mL IVPB        1 g 200 mL/hr over 30 Minutes Intravenous Every 12 hours 12/14/20 1952        PRN meds:    Objective: Vitals:   12/16/20 0537 12/16/20 0900  BP: (!) 158/59 (!) 142/55  Pulse: 64 68  Resp: 17 16   Temp: (!) 97.3 F (36.3 C) 97.8 F (36.6 C)  SpO2: 98% 98%    Intake/Output Summary (Last 24 hours) at 12/16/2020 1051 Last data filed at 12/16/2020 0900 Gross per 24 hour  Intake 560 ml  Output 1125 ml  Net -565 ml   There were no vitals filed  for this visit. Weight change:  There is no height or weight on file to calculate BMI.   Physical Exam: General exam: Pleasant elderly Caucasian female with dementia.  Not in physical distress Skin: No rashes, lesions or ulcers. HEENT: Atraumatic, normocephalic, no obvious bleeding Lungs: Clear to auscultation bilaterally CVS: Regular rate and rhythm, no murmur GI/Abd soft, nontender, nondistended, bowel sound present CNS: Alert, awake, able to confirm her name.  Knows he is in the hospital.  Mental status better than yesterday Psychiatry: Depressed look Extremities: No pedal edema, no calf tenderness  Data Review: I have personally reviewed the laboratory data and studies available.  Recent Labs  Lab 12/14/20 1248 12/15/20 0315 12/16/20 0434  WBC 6.6 6.8 6.5  NEUTROABS 4.0  --  3.3  HGB 11.0* 9.8* 9.6*  HCT 38.0 30.2* 31.2*  MCV 98.2 90.1 91.2  PLT 369 376 328   Recent Labs  Lab 12/14/20 1248 12/15/20 0315 12/16/20 0434  NA 142 138 141  K 3.4* 3.2* 4.2  CL 107 103 107  CO2 25 26 27   GLUCOSE 152* 122* 156*  BUN 16 16 18   CREATININE 0.73 0.58 0.76  CALCIUM 9.8 9.4 9.8    F/u labs ordered Unresulted Labs (From admission, onward)          Start     Ordered   12/16/20 0500  CBC with Differential/Platelet  Daily,   R      12/15/20 1100   12/16/20 8325  Basic metabolic panel  Daily,   R      12/15/20 1100          Signed, Terrilee Croak, MD [facility] 12/16/2020

## 2020-12-16 NOTE — Consult Note (Addendum)
Neurology Consultation  CC: Acute right facial droop Consulting physician: Dr. Pietro Cassis  History is obtained from: Daughter at bedside, chart review  HPI: Rebecca Pruitt is a 87 y.o. female with a medical history significant for dementia, coronary artery disease, type 2 diabetes mellitus, diabetic retinopathy, dyslipidemia, multiple CVAs, hearing loss, hypertension, recurrent urinary tract infections, chronic bladder outlet obstruction with large diverticulum s/p stent placement on 2/7, chronic atrial fibrillation, and severe aortic stenosis/moderate mitral regurgitation who was initially admitted to the hospital on 2/25 for evaluation of altered mental status. She was recently admitted in February (2/3 - 2/11) for recurrent UTI and chronic bladder outlet obstruction with large diverticulum and she underwent cystoscopy and stent placement on 2/7. She presented to outpatient urology 2/22 for stent removal but the stent appeared infected and was not removed at that time. Subsequently, she had a PICC placed and was admitted with plans for further IV antibiotics due to concerns with outpatient medications due to patient advanced age and dementia.   Today 2/27, her daughter at bedside noted that she had acute onset of right facial droop and a code stroke was activated and neurology was consulted for further evaluation.  LKW: 16:30 tpa given?: No, no evidence of LVO on clinical exam or initial imaging IR Thrombectomy? No, MRS of 4 Modified Rankin Scale: 4-Needs assistance to walk and tend to bodily needs NIHSS:  1a Level of Conscious.: 0 1b LOC Questions: 1 1c LOC Commands: 0 2 Best Gaze: 0 3 Visual: 0 4 Facial Palsy: 0 5a Motor Arm - left: 1 5b Motor Arm - Right: 1 6a Motor Leg - Left: 2 6b Motor Leg - Right: 2 7 Limb Ataxia: 0 8 Sensory: 0 9 Best Language: 0 10 Dysarthria: 1 11 Extinct. and Inatten.: 0 TOTAL: 8    ROS: Unable to obtain due to altered mental status.   Past Medical  History:  Diagnosis Date  . Cancer ([place])    skin  . Coronary artery disease   . Diabetes mellitus   . Diabetic retinopathy   . Dyslipidemia   . Family history of adverse reaction to anesthesia    pts daughter vomits and has very difficult time to awaken  . H/O: CVA (cardiovascular accident)   . Hearing loss   . Heart murmur   . History of kidney stones   . Hypertension   . Stroke [facility]) 03/2020   x5   Family History  Problem Relation Age of Onset  . Prostate cancer Other    Social History:  reports that she has never smoked. She has never used smokeless tobacco. She reports that she does not drink alcohol and does not use drugs.  Current Scheduled Medications: . aspirin EC  81 mg Oral QHS  . clopidogrel  75 mg Oral Daily  . enoxaparin (LOVENOX) injection  40 mg Subcutaneous Q24H  . fenofibrate  160 mg Oral QHS  . insulin aspart  0-5 Units Subcutaneous QHS  . insulin aspart  0-9 Units Subcutaneous TID WC  . insulin glargine  5 Units Subcutaneous Daily    Current PRN Medications: melatonin Current Outpatient Medications  Medication Instructions  . amoxicillin-clavulanate (AUGMENTIN) 875-125 MG tablet 1 tablet, Oral, 2 times daily  . aspirin 81 mg, Oral, Daily at bedtime, RESUME ON 12/03/20 IF NO BLOOD IN THE URINE  . citalopram (CELEXA) 20 mg, Oral, Daily at bedtime  . clopidogrel (PLAVIX) 75 mg, Oral, Daily, RESUME ON 12/03/20 IF NO BLOOD IN THE URINE  . CONTOUR  NEXT TEST test strip 1 each, 3 times daily  . CRANBERRY PO 4,200 mg, Oral, 2 times daily  . diphenhydrAMINE (BENADRYL) 25 mg, Oral, Every 6 hours PRN  . donepezil (ARICEPT) 10 mg, Oral, Daily at bedtime  . fenofibrate 160 mg, Oral, Daily at bedtime  . furosemide (LASIX) 20 mg, Oral, Daily  . insulin NPH-insulin regular (NOVOLIN 70/30) (70-30) 100 UNIT/ML injection 1-7 Units, Subcutaneous, 2 times daily PRN, If blood sugar is over 150 takes 1 unit ; sliding scale  . Insulin Pen Needle (B-D ULTRAFINE III SHORT PEN)  31G X 8 MM MISC See admin instructions  . LORazepam (ATIVAN) 0.5 mg, Oral, Every 8 hours PRN  . OXYGEN 2 L, Inhalation, Daily at bedtime    Exam: Current vital signs: BP (!) 149/57 (BP Location: Left Arm)   Pulse 73   Temp 98.6 F (37 C) (Oral)   Resp 16   SpO2 96%   Physical Exam  Attending addendum General: Cachectic looking woman in no apparent distress HEENT: Normocephalic atraumatic CVS: Regular rhythm Respiratory: No Rales Neurological exam Awake alert oriented to self Able to follow simple commands Could not tell me the month. Speech is mildly dysarthric Does not appear aphasic Cranial nerves: Pupils equal round react light, extraocular movement intact, visual fields full, face appears symmetric, facial sensation intact, tongue and palate midline. Motor exam: Both upper extremities drift before 10 seconds scoring 1 on NIH stroke scale.  Both lower extremities drift down to bed before 5 seconds scoring 2 on NIH stroke scale. Sensory intact Coordination difficult to assess Gait deferred  I have reviewed labs in epic and the pertinent results are: CBC    Component Value Date/Time   WBC 6.5 12/16/2020 0434   RBC 3.42 (L) 12/16/2020 0434   HGB 9.6 (L) 12/16/2020 0434   HCT 31.2 (L) 12/16/2020 0434   PLT 328 12/16/2020 0434   MCV 91.2 12/16/2020 0434   MCH 28.1 12/16/2020 0434   MCHC 30.8 12/16/2020 0434   RDW 14.3 12/16/2020 0434   LYMPHSABS 1.9 12/16/2020 0434   MONOABS 0.9 12/16/2020 0434   EOSABS 0.3 12/16/2020 0434   BASOSABS 0.0 12/16/2020 0434   CMP     Component Value Date/Time   NA 141 12/16/2020 0434   K 4.2 12/16/2020 0434   CL 107 12/16/2020 0434   CO2 27 12/16/2020 0434   GLUCOSE 156 (H) 12/16/2020 0434   BUN 18 12/16/2020 0434   CREATININE 0.76 12/16/2020 0434   CALCIUM 9.8 12/16/2020 0434   PROT 6.3 (L) 12/14/2020 1248   ALBUMIN 2.8 (L) 12/14/2020 1248   AST 16 12/14/2020 1248   ALT 11 12/14/2020 1248   ALKPHOS 44 12/14/2020 1248    BILITOT 0.5 12/14/2020 1248   GFRNONAA >60 12/16/2020 0434   GFRAA  05/25/2008 2100    >60        The eGFR has been calculated using the MDRD equation. This calculation has not been validated in all clinical   Lab Results  Component Value Date   HGBA1C 6.6 (H) 12/16/2020   I have reviewed the images obtained: CT head without contrast: 1. No acute intracranial process. Advanced chronic microvascular ischemic changes. 2. ASPECTS is 10  Assessment and recs per attending addendum  Pt seen by NP/Neuro and later by MD. Note/plan to be edited by MD as needed.  Anibal Henderson, AGAC-NP [facility] Pager: [phone]   Attending Neurohospitalist Addendum Patient seen and examined with APP/Resident. Agree with the history  and physical as documented above. Agree with the plan as documented, which I helped formulate. I have independently reviewed the chart, obtained history, review of systems and examined the patient.I have personally reviewed pertinent head/neck/spine imaging (CT/MRI).   Assessment: 87 year old female hospitalized for recurrent UTIs, chronic bladder outlet obstruction with large diverticulum s/p stent placement on 2/7 with suspected infected stent and altered mental status. Today, her daughter at bedside noted acute onset of right-sided facial droop and she was activated as a code stroke. - Multiple stroke risk factors including diabetes mellitus type 2, hypertension, hyperlipidemia, history of strokes x 5, CAD, and chronic atrial fibrillation.  - CT head without acute intracranial process but with advanced chronic microvascular ischemic changes. - MRI brain obtained stat-preliminary review negative for stroke Given her prior history of multiple strokes, this could be recrudescence of symptoms due to the systemic illness. I do not think she needs any further inpatient work-up -see recommendations below.  Impression: -Recrudescence of stroke  symptoms -Toxic metabolic encephalopathy  Recommendations: -Continue medical work-up as primary team is currently doing. -Management of current toxic metabolic derangements per primary team as you are. -Preliminary review of the MRI scan negative for acute stroke.  MRA head also unremarkable for ELVO.  Even if it turns out that she has a very small small vessel etiology stroke/cardioembolic stroke, she is very disabled from her prior strokes and I do not think she needs any further interventions or work-up. -She has a history of atrial fibrillation along with severe aortic stenosis amongst other comorbidities.  From a stroke prevention perspective, she should be on anticoagulation.  The daughter does not know if she was ever anticoagulation.  She was on Aggrenox at 1 point and has been on Plavix for the last 5 years. I would recommend that the primary team touch base with cardiology regarding anticoagulation from a cardiac standpoint. No further neurological work-up at this time. Please call neurology with questions as needed. Plan discussed with Dr. Pietro Cassis Plan discussed with patient's [name].  Neurology will be available as needed.  Please call with questions.  Please feel free to call with any questions.  -- Amie Portland, MD Neurologist [facility] Pager: [phone]  CRITICAL CARE ATTESTATION Performed by: Amie Portland, MD Total critical care time: 60 minutes Critical care time was exclusive of separately billable procedures and treating other patients and/or supervising APPs/Residents/Students Critical care was necessary to treat or prevent imminent or life-threatening deterioration due to strokelike symptoms, recrudescence of stroke symptoms, concern for toxic metabolic encephalopathy. This patient is critically ill and at significant risk for neurological worsening and/or death and care requires constant monitoring. Critical care was time spent  personally by me on the following activities: development of treatment plan with patient and/or surrogate as well as nursing, discussions with consultants, evaluation of patient's response to treatment, examination of patient, obtaining history from patient or surrogate, ordering and performing treatments and interventions, ordering and review of laboratory studies, ordering and review of radiographic studies, pulse oximetry, re-evaluation of patient's condition, participation in multidisciplinary rounds and medical decision making of high complexity in the care of this patient.

## 2020-12-16 NOTE — Progress Notes (Signed)
This RN alerted by patients daughter of right sided facial droop.   Last known well was 1640.  Rapid response was paged and code stroke was initiated at 1652.   Patient was taken to get a CT of the head w/o contrast and also a MRI of the brain.  Patient has now returned to her room.   Will continue to monitor.   Vira Agar, RN

## 2020-12-16 NOTE — Progress Notes (Signed)
  Speech Language Pathology Treatment: Dysphagia  Patient Details Name: Rebecca Pruitt MRN: [id number] DOB: 1929-04-06 Today's Date: 12/16/2020 Time: [id number] SLP Time Calculation (min) (ACUTE ONLY): 12 min  Assessment / Plan / Recommendation Clinical Impression  Pt was seen for skilled ST targeting diet tolerance and diagnostic treatment.  Pt was encountered asleep in bed, but she roused easily to minimal verbal stimulation and she was agreeable to this tx session.  RN reported that pt was tolerating Dysphagia 1 (puree) solids and thin liquids well.  Pt was seen with trials of puree, regular solids (cracker), and thin liquid.  Pt exhibited bolus holding in the central portion of her lingual surface and she did not attempt to masticate the bolus despite max cues.  Bolus was eventually removed from her oral cavity secondary to concerns for aspiration.  She exhibited suspected prolonged AP transport of puree and suspected delayed swallow initiation with puree and thin liquid, but no overt s/sx of aspiration were observed.  Recommend continuation of Dysphagia 1 (puree) solids and thin liquids with medications administered crushed in puree.  SLP will f/u per POC.     HPI HPI: [name] is a 87 y.o. female with medical history significant for recurrent UTI, chronic bladder outlet obstruction with large diverticulum s/p stent placement on 2/7,chronic atrial fibrillation , severe aortic stenosis/moderate mitral regurgitation, HTN, CVA, DM, HLD who was brought in by family for altered mental status.      SLP Plan  Continue with current plan of care       Recommendations  Diet recommendations: Dysphagia 1 (puree);Thin liquid Liquids provided via: Straw;Cup Medication Administration: Crushed with puree Supervision: Staff to assist with self feeding;Full supervision/cueing for compensatory strategies Compensations: Minimize environmental distractions;Small sips/bites;Slow rate                 Oral Care Recommendations: Oral care BID;Staff/trained caregiver to provide oral care Follow up Recommendations: 24 hour supervision/assistance;Skilled Nursing facility SLP Visit Diagnosis: Dysphagia, unspecified (R13.10) Plan: Continue with current plan of care       GO               Rebecca Pruitt M.S., [place] Office: [phone]  [place] 12/16/2020, 10:13 AM

## 2020-12-16 NOTE — Plan of Care (Signed)
  Problem: Education: Goal: Knowledge of General Education information will improve Description: Including pain rating scale, medication(s)/side effects and non-pharmacologic comfort measures Outcome: Progressing   Problem: Clinical Measurements: Goal: Ability to maintain clinical measurements within normal limits will improve Outcome: Progressing   Problem: Nutrition: Goal: Adequate nutrition will be maintained Outcome: Progressing   Problem: Elimination: Goal: Will not experience complications related to urinary retention Outcome: Progressing

## 2020-12-16 NOTE — Progress Notes (Signed)
Q4h Neuro checks initiated at 1800.  Vira Agar, RN

## 2020-12-17 DIAGNOSIS — Q248 Other specified congenital malformations of heart: Secondary | ICD-10-CM

## 2020-12-17 DIAGNOSIS — N390 Urinary tract infection, site not specified: Principal | ICD-10-CM

## 2020-12-17 DIAGNOSIS — D649 Anemia, unspecified: Secondary | ICD-10-CM

## 2020-12-17 DIAGNOSIS — G9341 Metabolic encephalopathy: Secondary | ICD-10-CM | POA: Diagnosis not present

## 2020-12-17 DIAGNOSIS — R410 Disorientation, unspecified: Secondary | ICD-10-CM

## 2020-12-17 DIAGNOSIS — Z7189 Other specified counseling: Secondary | ICD-10-CM | POA: Diagnosis not present

## 2020-12-17 DIAGNOSIS — E119 Type 2 diabetes mellitus without complications: Secondary | ICD-10-CM

## 2020-12-17 DIAGNOSIS — I350 Nonrheumatic aortic (valve) stenosis: Secondary | ICD-10-CM

## 2020-12-17 DIAGNOSIS — I482 Chronic atrial fibrillation, unspecified: Secondary | ICD-10-CM | POA: Diagnosis not present

## 2020-12-17 DIAGNOSIS — E876 Hypokalemia: Secondary | ICD-10-CM

## 2020-12-17 DIAGNOSIS — Z1612 Extended spectrum beta lactamase (ESBL) resistance: Secondary | ICD-10-CM

## 2020-12-17 DIAGNOSIS — B9629 Other Escherichia coli [E. coli] as the cause of diseases classified elsewhere: Secondary | ICD-10-CM

## 2020-12-17 DIAGNOSIS — I484 Atypical atrial flutter: Secondary | ICD-10-CM | POA: Diagnosis not present

## 2020-12-17 DIAGNOSIS — Z8673 Personal history of transient ischemic attack (TIA), and cerebral infarction without residual deficits: Secondary | ICD-10-CM | POA: Diagnosis not present

## 2020-12-17 DIAGNOSIS — F0390 Unspecified dementia without behavioral disturbance: Secondary | ICD-10-CM

## 2020-12-17 LAB — LIPID PANEL
Cholesterol: 129 mg/dL (ref 0–200)
HDL: 39 mg/dL — ABNORMAL LOW (ref >40)
LDL Cholesterol: 78 mg/dL (ref 0–99)
Total CHOL/HDL Ratio: 3.3 RATIO
Triglycerides: 58 mg/dL (ref <150)
VLDL: 12 mg/dL (ref 0–40)

## 2020-12-17 LAB — CBC WITH DIFFERENTIAL/PLATELET
Abs Immature Granulocytes: 0.02 10*3/uL (ref 0.00–0.07)
Basophils Absolute: 0.1 10*3/uL (ref 0.0–0.1)
Basophils Relative: 1 %
Eosinophils Absolute: 0.5 10*3/uL (ref 0.0–0.5)
Eosinophils Relative: 6 %
HCT: 31.1 % — ABNORMAL LOW (ref 36.0–46.0)
Hemoglobin: 9.6 g/dL — ABNORMAL LOW (ref 12.0–15.0)
Immature Granulocytes: 0 %
Lymphocytes Relative: 34 %
Lymphs Abs: 2.6 10*3/uL (ref 0.7–4.0)
MCH: 28.2 pg (ref 26.0–34.0)
MCHC: 30.9 g/dL (ref 30.0–36.0)
MCV: 91.2 fL (ref 80.0–100.0)
Monocytes Absolute: 0.9 10*3/uL (ref 0.1–1.0)
Monocytes Relative: 12 %
Neutro Abs: 3.5 10*3/uL (ref 1.7–7.7)
Neutrophils Relative %: 47 %
Platelets: 304 10*3/uL (ref 150–400)
RBC: 3.41 MIL/uL — ABNORMAL LOW (ref 3.87–5.11)
RDW: 14.3 % (ref 11.5–15.5)
WBC: 7.6 10*3/uL (ref 4.0–10.5)
nRBC: 0 % (ref 0.0–0.2)

## 2020-12-17 LAB — BASIC METABOLIC PANEL
Anion gap: 8 (ref 5–15)
BUN: 16 mg/dL (ref 8–23)
CO2: 29 mmol/L (ref 22–32)
Calcium: 10 mg/dL (ref 8.9–10.3)
Chloride: 103 mmol/L (ref 98–111)
Creatinine, Ser: 0.6 mg/dL (ref 0.44–1.00)
GFR, Estimated: >60 mL/min (ref >60)
Glucose, Bld: 136 mg/dL — ABNORMAL HIGH (ref 70–99)
Potassium: 4.1 mmol/L (ref 3.5–5.1)
Sodium: 140 mmol/L (ref 135–145)

## 2020-12-17 LAB — GLUCOSE, CAPILLARY
Glucose-Capillary: 129 mg/dL — ABNORMAL HIGH (ref 70–99)
Glucose-Capillary: 109 mg/dL — ABNORMAL HIGH (ref 70–99)
Glucose-Capillary: 189 mg/dL — ABNORMAL HIGH (ref 70–99)
Glucose-Capillary: 224 mg/dL — ABNORMAL HIGH (ref 70–99)

## 2020-12-17 MED ORDER — SODIUM CHLORIDE 0.9 % IV SOLN: INTRAVENOUS | Status: DC

## 2020-12-17 MED ORDER — HEPARIN (PORCINE) 25000 UT/250ML-% IV SOLN
750.0000 [IU]/h | INTRAVENOUS | Status: DC
  Administered 2020-12-17: 600 [IU]/h via INTRAVENOUS
  Filled 2020-12-17: qty 250

## 2020-12-17 MED ORDER — HEPARIN BOLUS VIA INFUSION
2000.0000 [IU] | Freq: Once | INTRAVENOUS | Status: AC
  Administered 2020-12-17: 2000 [IU] via INTRAVENOUS
  Filled 2020-12-17: qty 2000

## 2020-12-17 NOTE — Progress Notes (Signed)
PROGRESS NOTE  [name]  DOB: 1928-11-05  PCP: Vicenta Aly, [place] GHW:[id number]  DOA: 12/14/2020  LOS: 2 days   Chief Complaint  Patient presents with  . Recurrent UTI  . Weakness   Brief narrative: Rebecca Pruitt is a 87 y.o. female with PMH significant for DM2, HTN, HLD, CVA, chronic A. fib, severe aortic stenosis, moderate mitral regurgitation, chronic bladder outlet obstruction with large diverticulum status post stent placement on 2/7, recurrent UTI.  Patient was brought to the ED by family on 2/25 for altered mental status.  2/3-2/11, patient was hospitalized for recurrent UTI and chronic bladder outlet obstruction with large diverticulum.   2/7, she underwent cystoscopy and stent placement by urology. 2/22, patient was at urologist office for stent removal but her urine looked infected and the stent was not removed.  She was sent home on empiric treatment with Augmentin. Urine culture came back positive with 50,000 colonies of ESBL E. Coli. 2/25, patient presented to ID clinic for follow-up for ESBL UTI, complaint of increased lethargy.  ID Dr. Juleen China sent her to ED for admission and treatment with IV antibiotics.  In the ED, patient had a temperature of 100.1, blood pressure elevated to 160s Labs with hemoglobin of 11, potassium low at 3.4, lactic acid normal at 1.2, urinalysis with turbid amber color urine, large amount of leukocytes, few bacteria. Admitted to hospitalist service on IV meropenem. 2/27, code stroke called for facial droop.  CT scan and MRI negative for acute ischemia/infarct  Subjective: Patient was seen and examined this morning. Elderly Caucasian female with dementia..  Alert, awake, minimal words for me.  Not hypervolemic.  Assessment/Plan: ESBL UTI -History of recurrent UTI related to chronic bladder obstruction, recent instrumentation -Presented with increased lethargy.  Low-grade fever but normal WBC count and normal lactic acid  level. -Per ID recommendation, patient is currently on IV meropenem -Urine culture report pending.  ID consult called. Recent Labs  Lab 12/14/20 1248 12/14/20 1856 12/15/20 0315 12/16/20 0434 12/17/20 0354  WBC 6.6  --  6.8 6.5 7.6  LATICACIDVEN  --  1.2  --   --   --    Acute metabolic encephalopathy History of dementia -At baseline, patient is legally blind but is able to walk with a walker.  Per daughter, she code recognize her family members, oriented to place but not oriented to time -On admission, patient was altered probably because of UTI as well as from the effect of mood altering medications. -Mental status is gradually improving.  -Continue supportive care for dementia.  Currently on hold are Celexa, Aricept and as needed Ativan  Code stroke - 2/27 History of CVA Chronic atrial fibrillation  -2/27, code stroke was called for facial asymmetry.  Imagings did not show any acute ischemia/infarct.  Seen by neurology.  Patient is on aspirin, Plavix and fenofibrate but not on anticoagulation or statin despite having chronic A. fib.  Daughter denies history of GI bleeding.  Daughter requested cardiology input. Consult requested.  Severe aortic stenosis/moderate MR -Cardiology to comment if patient is a candidate for TAVR.  Dysphagia -Speech therapy evaluation obtained.  Currently on dysphagia one diet. -Per daughter, prior to this admission, patient was able to tolerate regular consistency.  Expect improvement with resolution of infection and improvement in mental status.  Type 2 diabetes -HbA1C 7.5 on 11/23/20 -Home meds include Novolin 70/30 twice a day as needed only if blood sugar is more than 150 -Currently on Lantus 5 units daily and sliding scale  insulin.  Continue the same.   Recent Labs  Lab 12/16/20 0636 12/16/20 1147 12/16/20 1625 12/16/20 2137 12/17/20 0631  GLUCAP 142* 257* 238* 226* 129*   Normocytic anemia -stable and chronic  Recent Labs     11/26/20 0450 11/26/20 1544 11/30/20 0530 12/14/20 1248 12/15/20 0315 12/16/20 0434 12/17/20 0354  HGB 8.0*   < > 10.6* 11.0* 9.8* 9.6* 9.6*  MCV 95.6   < > 93.4 98.2 90.1 91.2 91.2  VITAMINB12 504  --   --   --   --   --   --   FOLATE 8.1  --   --   --   --   --   --   FERRITIN 23  --   --   --   --   --   --   TIBC 234*  --   --   --   --   --   --   IRON 30  --   --   --   --   --   --   RETICCTPCT 1.8  --   --   --   --   --   --    < > = values in this interval not displayed.   Hypokalemia  -Potassium level improved today with replacement. Recent Labs  Lab 12/14/20 1248 12/15/20 0315 12/16/20 0434 12/17/20 0354  K 3.4* 3.2* 4.2 4.1   Mobility: PT evaluation obtained.  SNF versus 24-hour supervision at home recommended. Code Status:   Code Status: Partial Code  Nutritional status: There is no height or weight on file to calculate BMI.     Diet Order            DIET - DYS 1 Room service appropriate? No; Fluid consistency: Thin  Diet effective now                 DVT prophylaxis: enoxaparin (LOVENOX) injection 40 mg Start: 12/16/20 2030   Antimicrobials:  IV meropenem Fluid: None Consultants: ID Family Communication:  Not at bedside  Status is: Inpatient  Remains inpatient appropriate because: Pending cardiology input  Dispo: The patient is from: Home              Anticipated d/c is to: Home               Patient currently is not medically stable to d/c.   Difficult to place patient No   Infusions:  . meropenem (MERREM) IV 1 g (12/17/20 0854)    Scheduled Meds: . aspirin EC  81 mg Oral QHS  . clopidogrel  75 mg Oral Daily  . enoxaparin (LOVENOX) injection  40 mg Subcutaneous Q24H  . fenofibrate  160 mg Oral QHS  . insulin aspart  0-5 Units Subcutaneous QHS  . insulin aspart  0-9 Units Subcutaneous TID WC  . insulin glargine  5 Units Subcutaneous Daily    Antimicrobials: Anti-infectives (From admission, onward)   Start     Dose/Rate Route  Frequency Ordered Stop   12/14/20 2000  meropenem (MERREM) 1 g in sodium chloride 0.9 % 100 mL IVPB        1 g 200 mL/hr over 30 Minutes Intravenous Every 12 hours 12/14/20 1952        PRN meds:    Objective: Vitals:   12/17/20 0543 12/17/20 0942  BP: (!) 150/58 (!) 149/56  Pulse: (!) 58 62  Resp: 17 18  Temp: 97.8 F (36.6 C) 99.1  F (37.3 C)  SpO2: 98% 97%    Intake/Output Summary (Last 24 hours) at 12/17/2020 1025 Last data filed at 12/17/2020 0600 Gross per 24 hour  Intake 440 ml  Output 1375 ml  Net -935 ml   There were no vitals filed for this visit. Weight change:  There is no height or weight on file to calculate BMI.   Physical Exam: General exam: Pleasant elderly Caucasian female with dementia.  Not in physical distress Skin: No rashes, lesions or ulcers. HEENT: Atraumatic, normocephalic, no obvious bleeding Lungs: Clear to auscultate bilaterally CVS: Regular rate and rhythm, no murmur GI/Abd soft, nontender, nondistended, bowel sound present CNS: Alert, awake, able to confirm her name.  Knows he is in the hospital.  Mental status seem like yesterday Psychiatry: Depressed look Extremities: No pedal edema, no calf tenderness  Data Review: I have personally reviewed the laboratory data and studies available.  Recent Labs  Lab 12/14/20 1248 12/15/20 0315 12/16/20 0434 12/17/20 0354  WBC 6.6 6.8 6.5 7.6  NEUTROABS 4.0  --  3.3 3.5  HGB 11.0* 9.8* 9.6* 9.6*  HCT 38.0 30.2* 31.2* 31.1*  MCV 98.2 90.1 91.2 91.2  PLT 369 376 328 304   Recent Labs  Lab 12/14/20 1248 12/15/20 0315 12/16/20 0434 12/17/20 0354  NA 142 138 141 140  K 3.4* 3.2* 4.2 4.1  CL 107 103 107 103  CO2 25 26 27 29   GLUCOSE 152* 122* 156* 136*  BUN 16 16 18 16   CREATININE 0.73 0.58 0.76 0.60  CALCIUM 9.8 9.4 9.8 10.0    F/u labs ordered Unresulted Labs (From admission, onward)          Start     Ordered   12/17/20 1024  Lipid panel  Add-on,   AD        12/17/20 1023    12/16/20 0500  CBC with Differential/Platelet  Daily,   R      12/15/20 1100   12/16/20 0300  Basic metabolic panel  Daily,   R      12/15/20 1100          Signed, Terrilee Croak, MD [facility] 12/17/2020

## 2020-12-17 NOTE — Progress Notes (Signed)
Pharmacy Antibiotic Note  Rebecca Pruitt is a 87 y.o. female admitted on 12/14/2020 with ESBL Ecoli UTI.  Pharmacy has been consulted for Meropenem dosing.  Awaiting PICC line for home IV abx.  Renal fxn remains stable.  Plan: - Continue Meropenem 1g IV q12h  - Monitor patient's renal function and urine output      Temp (24hrs), Avg:98.4 F (36.9 C), Min:97.8 F (36.6 C), Max:99.1 F (37.3 C)  Recent Labs  Lab 12/14/20 1248 12/14/20 1856 12/15/20 0315 12/16/20 0434 12/17/20 0354  WBC 6.6  --  6.8 6.5 7.6  CREATININE 0.73  --  0.58 0.76 0.60  LATICACIDVEN  --  1.2  --   --   --     Estimated Creatinine Clearance: 35.8 mL/min (by C-G formula based on SCr of 0.6 mg/dL).    Allergies  Allergen Reactions  . Codeine     Syncope     Antimicrobials this admission: 2/25 Meropenem >>   Dose adjustments this admission:   Microbiology results:  pending  Thank you for allowing pharmacy to be a part of this patient's care.  [employer], Pharm.D., BCPS Clinical Pharmacist Clinical phone for 12/17/2020 from 8:30-4:00 is [phone].  **Pharmacist phone directory can be found on [email or web address] listed under [place].  12/17/2020 12:53 PM

## 2020-12-17 NOTE — Progress Notes (Signed)
ANTICOAGULATION CONSULT NOTE - Initial Consult  Pharmacy Consult for Heparin Indication: atrial fibrillation  Allergies  Allergen Reactions  . Codeine     Syncope     Patient Measurements: Height: 5\' 4"  (162.6 cm) Weight: 47.6 kg (104 lb 15 oz) IBW/kg (Calculated) : 54.7 Heparin Dosing Weight: 47.6 kg  Vital Signs: Temp: 98 F (36.7 C) (02/28 2028) Temp Source: Oral (02/28 2028) BP: 138/43 (02/28 2028) Pulse Rate: 58 (02/28 2028)  Labs: Recent Labs    12/15/20 0315 12/16/20 0434 12/17/20 0354  HGB 9.8* 9.6* 9.6*  HCT 30.2* 31.2* 31.1*  PLT 376 328 304  CREATININE 0.58 0.76 0.60    Estimated Creatinine Clearance: 35.8 mL/min (by C-G formula based on SCr of 0.6 mg/dL).   Medical History: Past Medical History:  Diagnosis Date  . Cancer ([place])    skin  . Coronary artery disease   . Diabetes mellitus   . Diabetic retinopathy   . Dyslipidemia   . Family history of adverse reaction to anesthesia    pts daughter vomits and has very difficult time to awaken  . H/O: CVA (cardiovascular accident)   . Hearing loss   . Heart murmur   . History of kidney stones   . Hypertension   . Stroke [facility]) 03/2020   x5   Assessment:  87 yr old female to begin IV heparin for atrial fibrillation. On Aspirin 81 mg and Clopidogrel 75 mg daily as PTA for hx multiple strokes.  Notes bruises easily.  Plan IV heparin tonight, monitor for bleeding, then discuss long-term anticoagulation based on heparin tolerance.   Lovenox 40 mg SQ given ~10pm on 2/27 for VTE prophylaxis.    Will begin heparin conservatively.    Goal of Therapy:  Heparin level 0.3-0.7 units/ml Monitor platelets by anticoagulation protocol: Yes   Plan:   Discontinue Lovenox.  Heparin 2000 units IV x 1 (~42 units/kg)   Heparin drip to begin at 600 units/hr (~12.6 units/kg/hr)  Heparin level ~8 hrs after drip begins.  Daily heparin level and CBC.  Monitor for signs/symptoms of bleeding.  Follow up anticoagulation  plan.  Arty Baumgartner, RPh 12/17/2020,8:39 PM

## 2020-12-17 NOTE — Progress Notes (Signed)
Inpatient Diabetes Program Recommendations  AACE/ADA: New Consensus Statement on Inpatient Glycemic Control (2015)  Target Ranges:  Prepandial:   less than 140 mg/dL      Peak postprandial:   less than 180 mg/dL (1-2 hours)      Critically ill patients:  140 - 180 mg/dL   Lab Results  Component Value Date   GLUCAP 129 (H) 12/17/2020   HGBA1C 6.6 (H) 12/16/2020    Review of Glycemic Control Results for Rebecca Pruitt, Rebecca Pruitt" (MRN [id number]) as of 12/17/2020 09:53  Ref. Range 12/16/2020 06:36 12/16/2020 11:47 12/16/2020 16:25 12/16/2020 21:37 12/17/2020 06:31  Glucose-Capillary Latest Ref Range: 70 - 99 mg/dL 142 (H) 257 (H) 238 (H) 226 (H) 129 (H)   Diabetes history: DM 2 Outpatient Diabetes medications: 70/30 1-7 units on a SSI Current orders for Inpatient glycemic control:  Lantus 5 units Novolog 0-9 units tid + hs  A1c 6.6 % on 2/27  Inpatient Diabetes Program Recommendations:    -  Consider adding Novolog 3 units tid meal coverage if eating >50% of meals.  Thanks,  Tama Headings RN, MSN, BC-ADM Inpatient Diabetes Coordinator Team Pager [phone] (8a-5p)'

## 2020-12-17 NOTE — Consult Note (Addendum)
Date of Admission:  12/14/2020          Reason for Consult: Recent ESBL in urine    Referring Provider: Dr. Marlowe Aschoff   Assessment:  1. ? ESBL UTI  2. Dementia 3. Hx of DM 4. Hx of CVA 5. Delirium 6. Atrial fibrillation  Plan:  1. Complete 5 days of carbapenem therapy and stop  2. No role for suppressive antibiotics here  She can followup with Dr. Juleen China at the end of March   Rebecca Pruitt has an appointment on 01/14/2021 at 90PM  The [facility] for Infect[facility] Disease is located in the [facility] at  [address] in [place].  [address], which is located to the left of the elevators.  Phone: [phone]  Fax: [phone]  [email or web address]  She should arrive 15 minutes prior to her appointment.  Active Problems:   Abnormal cardiac valve   DM2 (diabetes mellitus, type 2) (HCC)   Acute metabolic encephalopathy   Atrial fibrillation, chronic (HCC)   Anemia   Hypokalemia   History of CVA (cerebrovascular accident)   Scheduled Meds: . aspirin EC  81 mg Oral QHS  . clopidogrel  75 mg Oral Daily  . enoxaparin (LOVENOX) injection  40 mg Subcutaneous Q24H  . fenofibrate  160 mg Oral QHS  . insulin aspart  0-5 Units Subcutaneous QHS  . insulin aspart  0-9 Units Subcutaneous TID WC  . insulin glargine  5 Units Subcutaneous Daily   Continuous Infusions: . meropenem (MERREM) IV 1 g (12/17/20 0854)   PRN Meds:.melatonin  HPI: Rebecca Pruitt is a 87 y.o. female with history of atrial fibrillation prior stroke diabetes mellitus coronary disease hearing loss and I suspect underlying dementia.  She was seen by my partner Dr. Juleen China on Friday.  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  She was hospitalized in February 3 through February 11 when she had progressive generalized weakness and there was concern for recurrent  urinary tract infection.  Her urine cultures actually did not yield an organism.  She was seen by urology underwent cystoscopy and fulguration of the right ureter and bladder with stent placement 7 February.  Her urology office cultures from January 31 and 6 were also without growth on catheterized urine sample.  3 days prior to seeing Dr. Juleen China she had been seen in urology's office where they removed stent.  Apparently the urine looked infected according to them.  Urine culture done there yielded 50,000 colony-forming units of ESBL E. coli.  Prior to the cultures coming back she had been placed on Augmentin.  She was seen by my partner Dr. Juleen China with a plan that was in place for PICC line placement.  Unfortunately there was concerns that she was deteriorating in terms of her cognitive status and that this might be due to underlying urinary tract infection.  Dr. Juleen China help to arrange direct admission to the hospital.  Is admitted the hospitalist service and blood cultures and urine cultures were obtained.  Urine cultures only grown less than 10,000 colony-forming units of mixed flora.  Blood cultures have been sterile.  She has been on meropenem for now nearly 4 days.  She continues to suffer from delirium likely superimposed on dementia.  I do not see any evidence that she has a significant infection that is driving this.  I am comfortable continue her Carbapenem to complete  5 days of therapy but not think she needs further therapy beyond this.  I am suspicious that her episodes of delirium are not all related to urinary tract infections.  I would try to be very cautious about treating her for presumed urinary tract infections given the emergence of multidrug-resistant organisms in her already.  I do not think that chronic Macrobid will be helpful.  I will schedule her a follow-up appointment with Dr. Juleen China in the outpatient world.  I will sign off for now please call if further  questions.     Review of Systems: Review of Systems  Unable to perform ROS: Dementia    Past Medical History:  Diagnosis Date  . Cancer ([place])    skin  . Coronary artery disease   . Diabetes mellitus   . Diabetic retinopathy   . Dyslipidemia   . Family history of adverse reaction to anesthesia    pts daughter vomits and has very difficult time to awaken  . H/O: CVA (cardiovascular accident)   . Hearing loss   . Heart murmur   . History of kidney stones   . Hypertension   . Stroke [facility]) 03/2020   x5    Social History   Tobacco Use  . Smoking status: Never Smoker  . Smokeless tobacco: Never Used  Vaping Use  . Vaping Use: Never used  Substance Use Topics  . Alcohol use: No  . Drug use: Never    Family History  Problem Relation Age of Onset  . Prostate cancer Other    Allergies  Allergen Reactions  . Codeine     Syncope     OBJECTIVE: Blood pressure (!) 149/56, pulse 62, temperature 99.1 F (37.3 C), temperature source Axillary, resp. rate 18, SpO2 97 %.  Physical Exam Constitutional:      General: She is not in acute distress.    Appearance: Normal appearance. She is well-developed and well-nourished. She is not ill-appearing or diaphoretic.  HENT:     Head: Normocephalic and atraumatic.     Right Ear: Hearing and external ear normal.     Left Ear: Hearing and external ear normal.     Nose: No nasal deformity, rhinorrhea or epistaxis.  Eyes:     General: No scleral icterus.    Extraocular Movements: Extraocular movements intact and EOM normal.     Conjunctiva/sclera: Conjunctivae normal.     Right eye: Right conjunctiva is not injected.     Left eye: Left conjunctiva is not injected.     Pupils: Pupils are equal, round, and reactive to light.  Neck:     Vascular: No JVD.  Cardiovascular:     Rate and Rhythm: Normal rate. Rhythm irregular.     Heart sounds: Normal heart sounds, S1 normal and S2 normal. No murmur heard. No friction rub.   Pulmonary:     Effort: Pulmonary effort is normal. No respiratory distress.     Breath sounds: No wheezing.  Abdominal:     General: Bowel sounds are normal. There is no distension or ascites.     Palpations: Abdomen is soft. There is no hepatosplenomegaly.     Tenderness: There is no abdominal tenderness.  Musculoskeletal:        General: Normal range of motion.     Right shoulder: Normal.     Left shoulder: Normal.     Cervical back: Normal range of motion and neck supple.     Right hip: Normal.  Left hip: Normal.     Right knee: Normal.     Left knee: Normal.  Lymphadenopathy:     Head:     Right side of head: No submandibular, preauricular or posterior auricular adenopathy.     Left side of head: No submandibular, preauricular or posterior auricular adenopathy.     Cervical: No cervical adenopathy.     Right cervical: No superficial or deep cervical adenopathy.    Left cervical: No superficial or deep cervical adenopathy.  Skin:    General: Skin is warm, dry and intact.     Coloration: Skin is pale.     Findings: No abrasion, bruising, ecchymosis, erythema, lesion or rash.     Nails: There is no clubbing or cyanosis.  Neurological:     Mental Status: She is alert. She is disoriented.     Deep Tendon Reflexes: Strength normal.  Psychiatric:        Attention and Perception: She is attentive.        Mood and Affect: Mood and affect normal.        Speech: Speech normal.        Behavior: Behavior is cooperative.        Cognition and Memory: Cognition and memory normal.     Lab Results Lab Results  Component Value Date   WBC 7.6 12/17/2020   HGB 9.6 (L) 12/17/2020   HCT 31.1 (L) 12/17/2020   MCV 91.2 12/17/2020   PLT 304 12/17/2020    Lab Results  Component Value Date   CREATININE 0.60 12/17/2020   BUN 16 12/17/2020   NA 140 12/17/2020   K 4.1 12/17/2020   CL 103 12/17/2020   CO2 29 12/17/2020    Lab Results  Component Value Date   ALT 11 12/14/2020    AST 16 12/14/2020   ALKPHOS 44 12/14/2020   BILITOT 0.5 12/14/2020     Microbiology: Recent Results (from the past 240 hour(s))  Urine culture     Status: Abnormal   Collection Time: 12/14/20  7:49 PM   Specimen: Urine, Random  Result Value Ref Range Status   Specimen Description URINE, RANDOM  Final   Special Requests NONE  Final   Culture (A)  Final    <10,000 COLONIES/mL INSIGNIFICANT GROWTH Performed at [facility], [address], [place], [place] [address]    Report Status 12/16/2020 FINAL  Final  Resp Panel by RT-PCR (Flu A&B, Covid) Nasopharyngeal Swab     Status: None   Collection Time: 12/14/20  8:01 PM   Specimen: Nasopharyngeal Swab; Nasopharyngeal(NP) swabs in vial transport medium  Result Value Ref Range Status   SARS Coronavirus 2 by RT PCR NEGATIVE NEGATIVE Final    Comment: (NOTE) SARS-CoV-2 target nucleic acids are NOT DETECTED.  The SARS-CoV-2 RNA is generally detectable in upper respiratory specimens during the acute phase of infection. The lowest concentration of SARS-CoV-2 viral copies this assay can detect is 138 copies/mL. A negative result does not preclude SARS-Cov-2 infection and should not be used as the sole basis for treatment or other patient management decisions. A negative result may occur with  improper specimen collection/handling, submission of specimen other than nasopharyngeal swab, presence of viral mutation(s) within the areas targeted by this assay, and inadequate number of viral copies(<138 copies/mL). A negative result must be combined with clinical observations, patient history, and epidemiological information. The expected result is Negative.  Fact Sheet for Patients:  EntrepreneurPulse.com.au  Fact Sheet for Healthcare Providers:  IncredibleEmployment.be  This test is no t yet approved or cleared by the [name] and  has been authorized for detection and/or diagnosis of  SARS-CoV-2 by FDA under an Emergency Use Authorization (EUA). This EUA will remain  in effect (meaning this test can be used) for the duration of the COVID-19 declaration under Section 564(b)(1) of the Act, 21 U.S.C.section 360bbb-3(b)(1), unless the authorization is terminated  or revoked sooner.       Influenza A by PCR NEGATIVE NEGATIVE Final   Influenza B by PCR NEGATIVE NEGATIVE Final    Comment: (NOTE) The Xpert Xpress SARS-CoV-2/FLU/RSV plus assay is intended as an aid in the diagnosis of influenza from Nasopharyngeal swab specimens and should not be used as a sole basis for treatment. Nasal washings and aspirates are unacceptable for Xpert Xpress SARS-CoV-2/FLU/RSV testing.  Fact Sheet for Patients: EntrepreneurPulse.com.au  Fact Sheet for Healthcare Providers: IncredibleEmployment.be  This test is not yet approved or cleared by the [place] FDA and has been authorized for detection and/or diagnosis of SARS-CoV-2 by FDA under an Emergency Use Authorization (EUA). This EUA will remain in effect (meaning this test can be used) for the duration of the COVID-19 declaration under Section 564(b)(1) of the Act, 21 U.S.C. section 360bbb-3(b)(1), unless the authorization is terminated or revoked.  Performed at [facility], [place] [address] [place], [place] [address]   Culture, blood (routine x 2)     Status: None (Preliminary result)   Collection Time: 12/14/20  8:01 PM   Specimen: BLOOD RIGHT WRIST  Result Value Ref Range Status   Specimen Description BLOOD RIGHT WRIST  Final   Special Requests   Final    BOTTLES DRAWN AEROBIC AND ANAEROBIC Blood Culture results may not be optimal due to an inadequate volume of blood received in culture bottles   Culture   Final    NO GROWTH 3 DAYS Performed at [facility], [place] [address], [place], [place] [address]    Report Status PENDING  Incomplete  Culture, blood (routine x 2)      Status: None (Preliminary result)   Collection Time: 12/14/20 10:27 PM   Specimen: BLOOD LEFT ARM  Result Value Ref Range Status   Specimen Description BLOOD LEFT ARM  Final   Special Requests   Final    AEROBIC BOTTLE ONLY Blood Culture results may not be optimal due to an inadequate volume of blood received in culture bottles   Culture   Final    NO GROWTH 3 DAYS Performed at [facility], [place] [address], [place], [place] [address]    Report Status PENDING  Incomplete    Alcide Evener, [facility] [phone] pager  12/17/2020, 2:41 PM

## 2020-12-17 NOTE — Consult Note (Signed)
Cardiology Consultation:   Patient ID: [name] MRN: [id number]; DOB: March 25, 1929  Admit date: 12/14/2020 Date of Consult: 12/17/2020  PCP:  Vicenta Aly, [place]  Cardiologist:  Buford Dresser, MD new   Patient Profile:   Rebecca Pruitt is a 87 y.o. female with a hx of chronic bladder obstruction, recurrent UTI, multiple CVAs, HTN, HLD, DM2, and dementia who is being seen today for the evaluation of Afib, severe AS, and need for chronic anticoagulation at the request of Dr. Pietro Cassis.  History of Present Illness:   [name] does not currently follow with cardiology. She has recently struggled with recurrent UTI, for which she has been following with [facility] PCP. She also has vascular dementia - ACEI and BB stopped last year. She is reportedly nonverbal and requires assistance with all ADLs. She is primarily under the care of her daughter, Rebecca Pruitt. Prior to admission, she was on plavix and ASA for history of strokes. She has been maintained on 20 mg laisx daily (now 10 mg daily), fenofibrate, and 40 mg lipitor. Recently D/C'ed 5 mg lisinopril and lopressor 25 mg BID.    She was hospitalized 11/22/20 - 11/30/20 for recurrent UTI and hematuria. Urine culture with ESBL E coli. Urology performed cystoscopy and stent placement. She was treated with ABX and discharged. Echo performed that admission shows normal EF, grade 2 DD, moderate pericardial effusion, left atrial size severely dilated, severe AS, severe MAC, and moderate MR.  She followed up with urology OP, but urine appeared infected and stent was left in place. Unfortunately, she was re-hospitalized 12/14/20 after a visit with OP ID in which she had increasing lethargy and changes in speaking patterns.  She does have baseline dementia. ID felt that she needed to be admitted for IV ertapenem. Upon arrival, she was febrile at 100.1, mildly hypertensive, with stable chronic anemia. PICC placed  for IV ABX. Code Stroke called 12/16/20 for new facial droop. Neurology was consulted. CT and MRI negative for acute ischemia/infarct.  She does not have a history of GI bleed. Daughter requested cardiology consult.   Level 5 Caveat for dementia.  Daughter helps provide history. I was able to speak with Rebecca Pruitt this afternoon. She is not aware of a history of Afib and has never been informed of an arrhythmia. She has been on ASA and plavix for hx of 5 strokes starting in 2007 (2009, 2017, 2019, 03/2020). Prior to Feb, she was able to walk 104 ft with her walker, she knew family members' names and joked during conversations. Between hospitalizations, she was abel to walk 36 ft, and continued to decline. She does not have a history of falls. She is legally blind. No complaints of dyspnea or chest pain.    Past Medical History:  Diagnosis Date  . Cancer ([place])    skin  . Coronary artery disease   . Diabetes mellitus   . Diabetic retinopathy   . Dyslipidemia   . Family history of adverse reaction to anesthesia    pts daughter vomits and has very difficult time to awaken  . H/O: CVA (cardiovascular accident)   . Hearing loss   . Heart murmur   . History of kidney stones   . Hypertension   . Stroke [facility]) 03/2020   x5    Past Surgical History:  Procedure Laterality Date  . CESAREAN SECTION    . CYSTOSCOPY WITH RETROGRADE PYELOGRAM, URETEROSCOPY AND STENT PLACEMENT Right 11/26/2020   Procedure: CYSTOSCOPY WITH  RIGHT RETROGRADE PYELOGRAM, RIGHT URETEROSCOPY AND PYELOSCOPY, RIGHT STENT PLACEMENT, FULGERATION OF RIGHT URETER AND BLADDER;  Surgeon: Remi Haggard, MD;  Location: [facility];  Service: Urology;  Laterality: Right;  . DILATION AND CURETTAGE OF UTERUS    . EYE SURGERY Bilateral   . hysterectomy - unknown type    . MASTOIDECTOMY     as a child     Home Medications:  Prior to Admission medications   Medication Sig Start Date End Date Taking? Authorizing Provider  aspirin 81 MG  tablet Take 1 tablet (81 mg total) by mouth at bedtime. RESUME ON 12/03/20 IF NO BLOOD IN THE URINE 12/03/20  Yes Bonnielee Haff, MD  citalopram (CELEXA) 20 MG tablet Take 20 mg by mouth at bedtime.   Yes [provider]  clopidogrel (PLAVIX) 75 MG tablet Take 1 tablet (75 mg total) by mouth daily. RESUME ON 12/03/20 IF NO BLOOD IN THE URINE 12/03/20  Yes Bonnielee Haff, MD  CRANBERRY PO Take 4,200 mg by mouth 2 (two) times daily.   Yes [provider]  diphenhydrAMINE (BENADRYL) 25 MG tablet Take 25 mg by mouth every 6 (six) hours as needed for sleep.   Yes [provider]  donepezil (ARICEPT) 10 MG tablet Take 10 mg by mouth at bedtime.   Yes [provider]  fenofibrate 160 MG tablet Take 160 mg by mouth at bedtime.   Yes [provider]  furosemide (LASIX) 20 MG tablet Take 20 mg by mouth daily.   Yes [provider]  insulin NPH-insulin regular (NOVOLIN 70/30) (70-30) 100 UNIT/ML injection Inject 1-7 Units into the skin 2 (two) times daily as needed (high blood sugar). If blood sugar is over 150 takes 1 unit ; sliding scale   Yes [provider]  LORazepam (ATIVAN) 0.5 MG tablet Take 0.5 mg by mouth every 8 (eight) hours as needed for anxiety or sleep (hallucinations). 12/05/20  Yes [provider]  OXYGEN Inhale 2 L into the lungs at bedtime.   Yes [provider]  amoxicillin-clavulanate (AUGMENTIN) 875-125 MG tablet Take 1 tablet by mouth 2 (two) times daily.    [provider]  CONTOUR NEXT TEST test strip 1 each 3 (three) times daily. 12/05/20   [provider]  Insulin Pen Needle (B-D ULTRAFINE III SHORT PEN) 31G X 8 MM MISC See admin instructions. 12/13/20   [provider]    Inpatient Medications: Scheduled Meds: . aspirin EC  81 mg Oral QHS  . clopidogrel  75 mg Oral Daily  . enoxaparin (LOVENOX) injection  40 mg Subcutaneous Q24H  . fenofibrate  160 mg Oral QHS  . insulin  aspart  0-5 Units Subcutaneous QHS  . insulin aspart  0-9 Units Subcutaneous TID WC  . insulin glargine  5 Units Subcutaneous Daily   Continuous Infusions: . meropenem (MERREM) IV 1 g (12/17/20 0854)   PRN Meds: melatonin  Allergies:    Allergies  Allergen Reactions  . Codeine     Syncope     Social History:   Social History   Socioeconomic History  . Marital status: Widowed    Spouse name: Not on file  . Number of children: 1  . Years of education: Not on file  . Highest education level: Not on file  Occupational History  . Occupation: retired  Tobacco Use  . Smoking status: Never Smoker  . Smokeless tobacco: Never Used  Vaping Use  . Vaping Use: Never used  Substance  and Sexual Activity  . Alcohol use: No  . Drug use: Never  . Sexual activity: Not on file  Other Topics Concern  . Not on file  Social History Narrative  . Not on file   Social Determinants of Health   Financial Resource Strain: Not on file  Food Insecurity: Not on file  Transportation Needs: Not on file  Physical Activity: Not on file  Stress: Not on file  Social Connections: Not on file  Intimate Partner Violence: Not on file    Family History:    Family History  Problem Relation Age of Onset  . Prostate cancer Other      ROS:  Please see the history of present illness.   All other ROS reviewed and negative.     Physical Exam/Data:   Vitals:   12/16/20 1640 12/16/20 2040 12/17/20 0543 12/17/20 0942  BP: (!) 149/57 (!) 137/55 (!) 150/58 (!) 149/56  Pulse: 73 78 (!) 58 62  Resp: _0 Temp: 98.6 F (37 C) 98.1 F (36.7 C) 97.8 F (36.6 C) 99.1 F (37.3 C)  TempSrc: Oral Axillary Axillary Axillary  SpO2: 96% 96% 98% 97%    Intake/Output Summary (Last 24 hours) at 12/17/2020 1347 Last data filed at 12/17/2020 0900 Gross per 24 hour  Intake 220 ml  Output 1200 ml  Net -980 ml   Last 3 Weights 12/14/2020 11/23/2020 11/22/2020  Weight (lbs) 105 lb 106 lb 11.2 oz 132 lb   Weight (kg) 47.628 kg 48.4 kg 59.875 kg     There is no height or weight on file to calculate BMI.  General:  Thin elderly female found sleeping upright HEENT: normal Neck: no JVD Vascular: No carotid bruits  Cardiac:  normal S1, S2; RRR; 4/6 systolic murmur Lungs:  unlabored Abd: soft, nontender, no hepatomegaly  Ext: no edema Musculoskeletal:  No deformities, BUE and BLE strength normal and equal Skin: warm and dry  Neuro:  CNs 2-12 intact, no focal abnormalities noted Psych:  Normal affect   EKG:  The EKG was personally reviewed and demonstrates:  pending Telemetry:  Telemetry was personally reviewed and demonstrates:  N/A  Relevant CV Studies:  Echo 11/23/20: 1. Left ventricular ejection fraction, by estimation, is 65 to 70%. The  left ventricle has normal function. The left ventricle has no regional  wall motion abnormalities. There is severe left ventricular hypertrophy.  Left ventricular diastolic parameters  are consistent with Grade II diastolic dysfunction (pseudonormalization).  2. Right ventricular systolic function is normal. The right ventricular  size is normal. There is normal pulmonary artery systolic pressure.  3. Left atrial size was severely dilated.  4. Pericardial effusion - 1.1 cm. Moderate pericardial effusion. The  pericardial effusion is lateral to the left ventricle. There is no  evidence of cardiac tamponade.  5. The mitral valve is normal in structure. Moderate mitral valve  regurgitation. No evidence of mitral stenosis. The mean mitral valve  gradient is 4.0 mmHg. Severe mitral annular calcification.  6. The aortic valve is tricuspid. Aortic valve regurgitation is not  visualized. Severe aortic valve stenosis. Aortic valve area, by VTI  measures 0.84 cm. Aortic valve mean gradient measures 38.3 mmHg. Aortic  valve Vmax measures 3.97 m/s.  7. The inferior vena cava is normal in size with greater than 50%  respiratory variability,  suggesting right atrial pressure of 3 mmHg.   Laboratory Data:  High Sensitivity Troponin:  No results for input(s): TROPONINIHS in the last  720 hours.   Chemistry Recent Labs  Lab 12/15/20 0315 12/16/20 0434 12/17/20 0354  NA 138 141 140  K 3.2* 4.2 4.1  CL 103 107 103  CO2 _0 GLUCOSE 122* 156* 136*  BUN _1 CREATININE 0.58 0.76 0.60  CALCIUM 9.4 9.8 10.0  GFRNONAA >60 >60 >60  ANIONGAP _2 Recent Labs  Lab 12/14/20 1248  PROT 6.3*  ALBUMIN 2.8*  AST 16  ALT 11  ALKPHOS 44  BILITOT 0.5   Hematology Recent Labs  Lab 12/15/20 0315 12/16/20 0434 12/17/20 0354  WBC 6.8 6.5 7.6  RBC 3.35* 3.42* 3.41*  HGB 9.8* 9.6* 9.6*  HCT 30.2* 31.2* 31.1*  MCV 90.1 91.2 91.2  MCH 29.3 28.1 28.2  MCHC 32.5 30.8 30.9  RDW 14.6 14.3 14.3  PLT 376 328 304   BNPNo results for input(s): BNP, PROBNP in the last 168 hours.  DDimer No results for input(s): DDIMER in the last 168 hours.   Radiology/Studies:  MR ANGIO HEAD WO CONTRAST  Result Date: 12/16/2020 CLINICAL DATA:  Neuro deficit, acute, stroke suspected EXAM: MRI HEAD WITHOUT CONTRAST MRA HEAD WITHOUT CONTRAST TECHNIQUE: Multiplanar, multiecho pulse sequences of the brain and surrounding structures were obtained without intravenous contrast. Angiographic images of the head were obtained using MRA technique without contrast. COMPARISON:  12/16/2020 and prior. FINDINGS: MRI HEAD FINDINGS Brain: No diffusion-weighted signal abnormality. No intracranial hemorrhage. No midline shift, ventriculomegaly or extra-axial fluid collection. No mass lesion. Moderate cerebral atrophy with ex vacuo dilatation. Chronic left pontine and right thalamic and bilateral corona radiata/centrum semiovale lacunar insults. Advanced chronic microvascular ischemic changes. Vascular: Please see MRA. Skull and upper cervical spine: Normal marrow signal. Sinuses/Orbits: Sequela of bilateral lens replacement. Clear paranasal sinuses.  Pneumatized mastoid air cells. Other: None. MRA HEAD FINDINGS Anterior circulation: Patent ICAs and ophthalmic artery origins. Patent ACAs, trifurcated. Patent MCAs. Mild bilateral M2 segment narrowing. Posterior circulation: Dominant left vertebral artery supplies the basilar. Patent left PICA. Right vertebral terminates as PICA. Diminutive basilar artery, patent. Patent superior cerebellar arteries with mild proximal narrowing on the left. Fetal origin of the bilateral PCAs. Mild left P3 segment narrowing. Anatomic variants: Please see above. IMPRESSION: MRI head: No acute intracranial process. Multifocal lacunar insults as detailed above. Moderate cerebral atrophy and advanced chronic microvascular ischemic changes. MRA head: No emergent vascular finding within the head. Mild bilateral M2, left superior cerebellar artery and left P3 segment narrowing. Electronically Signed   By: Primitivo Gauze M.D.   On: 12/16/2020 18:56   MR BRAIN WO CONTRAST  Result Date: 12/16/2020 CLINICAL DATA:  Neuro deficit, acute, stroke suspected EXAM: MRI HEAD WITHOUT CONTRAST MRA HEAD WITHOUT CONTRAST TECHNIQUE: Multiplanar, multiecho pulse sequences of the brain and surrounding structures were obtained without intravenous contrast. Angiographic images of the head were obtained using MRA technique without contrast. COMPARISON:  12/16/2020 and prior. FINDINGS: MRI HEAD FINDINGS Brain: No diffusion-weighted signal abnormality. No intracranial hemorrhage. No midline shift, ventriculomegaly or extra-axial fluid collection. No mass lesion. Moderate cerebral atrophy with ex vacuo dilatation. Chronic left pontine and right thalamic and bilateral corona radiata/centrum semiovale lacunar insults. Advanced chronic microvascular ischemic changes. Vascular: Please see MRA. Skull and upper cervical spine: Normal marrow signal. Sinuses/Orbits: Sequela of bilateral lens replacement. Clear paranasal sinuses. Pneumatized mastoid air cells.  Other: None. MRA HEAD FINDINGS Anterior circulation: Patent ICAs and ophthalmic artery origins. Patent ACAs, trifurcated. Patent MCAs. Mild bilateral M2 segment narrowing. Posterior circulation: Dominant  left vertebral artery supplies the basilar. Patent left PICA. Right vertebral terminates as PICA. Diminutive basilar artery, patent. Patent superior cerebellar arteries with mild proximal narrowing on the left. Fetal origin of the bilateral PCAs. Mild left P3 segment narrowing. Anatomic variants: Please see above. IMPRESSION: MRI head: No acute intracranial process. Multifocal lacunar insults as detailed above. Moderate cerebral atrophy and advanced chronic microvascular ischemic changes. MRA head: No emergent vascular finding within the head. Mild bilateral M2, left superior cerebellar artery and left P3 segment narrowing. Electronically Signed   By: Primitivo Gauze M.D.   On: 12/16/2020 18:56   CT HEAD CODE STROKE WO CONTRAST  Result Date: 12/16/2020 CLINICAL DATA:  Code stroke.  Neuro deficit, acute, stroke suspected EXAM: CT HEAD WITHOUT CONTRAST TECHNIQUE: Contiguous axial images were obtained from the base of the skull through the vertex without intravenous contrast. COMPARISON:  11/22/2020 and prior. FINDINGS: Brain: No acute infarct or intracranial hemorrhage. No mass lesion. No midline shift, ventriculomegaly or extra-axial fluid collection. Diffuse parenchymal volume loss with ex vacuo dilatation. Chronic microvascular ischemic changes. Vascular: No hyperdense vessel or unexpected calcification. Bilateral skull base atherosclerotic calcifications. Skull: No acute finding.  Sequela of right mastoidectomy. Sinuses/Orbits: Normal orbits. Clear paranasal sinuses. No mastoid effusion. Other: None. ASPECTS [facility] Stroke Program Early CT Score) - Ganglionic level infarction (caudate, lentiform nuclei, internal capsule, insula, M1-M3 cortex): 7 - Supraganglionic infarction (M4-M6 cortex): 3 Total score  (0-10 with 10 being normal): 10 IMPRESSION: 1. No acute intracranial process. Advanced chronic microvascular ischemic changes. 2. ASPECTS is 10 3. Code stroke imaging results were communicated on 12/16/2020 at 5:28 pm to provider Dr. Rory Percy via secure text paging. Electronically Signed   By: Primitivo Gauze M.D.   On: 12/16/2020 17:29     Assessment and Plan:   Severe aortic stenosis Moderate MR - new diagnosis on echo - would not be a surgical candidate, but could consider referral to structural heart team if she has cognitive recovery from her infection, although at baseline she has vascular dementia   Hx of CVA - total of 5 prior strokes - code stroke called this admission, imaging unremarkable, neurology felt symptoms were due to her infection - on ASA and plavix, statin   Hypertension - ACEI and BB previously D/C'ed - SBP 140-150s   Hyperlipidemia 12/17/2020: Cholesterol 129; HDL 39; LDL Cholesterol 78; Triglycerides 58; VLDL 12 - on statin and fenofibrate   Pericardial effusion - noted on echo 11/23/20 - consider repeating echo this admission   Need for chronic anticoagulation - pt carries a diagnosis of chronic Afib in the chart - I do not see evidence of Afib and daughter does not report history of Afib - not currently on telemetry - will obtain an EKG this admission - given her history of strokes, Afib is a possibility - could consider placing a heart monitor at discharge if patient is an anticoagulation candidate - no history of falls, but she has declined over the past month in the setting of recurrent infections   Chronic diastolic heart failure - daughter has been giving her only 10 mg lasix - drinks approximately 64 oz water daily   Disposition - she was in rehab last fall, but daughter was unable to visit her and she came out more debilitated - she will be discharging to daughter's care this admissino   Risk Assessment/Risk Scores:                 For questions or updates, please  contact [place] Please consult [email or web address] for contact info under    Signed, Ledora Bottcher, PA  12/17/2020 1:47 PM

## 2020-12-18 DIAGNOSIS — I313 Pericardial effusion (noninflammatory): Secondary | ICD-10-CM

## 2020-12-18 DIAGNOSIS — I482 Chronic atrial fibrillation, unspecified: Secondary | ICD-10-CM | POA: Diagnosis not present

## 2020-12-18 DIAGNOSIS — I4891 Unspecified atrial fibrillation: Secondary | ICD-10-CM

## 2020-12-18 LAB — CBC WITH DIFFERENTIAL/PLATELET
Abs Immature Granulocytes: 0.02 10*3/uL (ref 0.00–0.07)
Basophils Absolute: 0 10*3/uL (ref 0.0–0.1)
Basophils Relative: 0 %
Eosinophils Absolute: 0.5 10*3/uL (ref 0.0–0.5)
Eosinophils Relative: 6 %
HCT: 31.2 % — ABNORMAL LOW (ref 36.0–46.0)
Hemoglobin: 9.5 g/dL — ABNORMAL LOW (ref 12.0–15.0)
Immature Granulocytes: 0 %
Lymphocytes Relative: 32 %
Lymphs Abs: 2.3 10*3/uL (ref 0.7–4.0)
MCH: 27.8 pg (ref 26.0–34.0)
MCHC: 30.4 g/dL (ref 30.0–36.0)
MCV: 91.2 fL (ref 80.0–100.0)
Monocytes Absolute: 0.8 10*3/uL (ref 0.1–1.0)
Monocytes Relative: 11 %
Neutro Abs: 3.6 10*3/uL (ref 1.7–7.7)
Neutrophils Relative %: 51 %
Platelets: 314 10*3/uL (ref 150–400)
RBC: 3.42 MIL/uL — ABNORMAL LOW (ref 3.87–5.11)
RDW: 14.6 % (ref 11.5–15.5)
WBC: 7.2 10*3/uL (ref 4.0–10.5)
nRBC: 0 % (ref 0.0–0.2)

## 2020-12-18 LAB — BASIC METABOLIC PANEL
Anion gap: 9 (ref 5–15)
BUN: 20 mg/dL (ref 8–23)
CO2: 26 mmol/L (ref 22–32)
Calcium: 10.1 mg/dL (ref 8.9–10.3)
Chloride: 102 mmol/L (ref 98–111)
Creatinine, Ser: 0.61 mg/dL (ref 0.44–1.00)
GFR, Estimated: >60 mL/min (ref >60)
Glucose, Bld: 189 mg/dL — ABNORMAL HIGH (ref 70–99)
Potassium: 4.2 mmol/L (ref 3.5–5.1)
Sodium: 137 mmol/L (ref 135–145)

## 2020-12-18 LAB — GLUCOSE, CAPILLARY
Glucose-Capillary: 152 mg/dL — ABNORMAL HIGH (ref 70–99)
Glucose-Capillary: 123 mg/dL — ABNORMAL HIGH (ref 70–99)
Glucose-Capillary: 223 mg/dL — ABNORMAL HIGH (ref 70–99)
Glucose-Capillary: 247 mg/dL — ABNORMAL HIGH (ref 70–99)

## 2020-12-18 LAB — HEPARIN LEVEL (UNFRACTIONATED): Heparin Unfractionated: <0.1 IU/mL — ABNORMAL LOW (ref 0.30–0.70)

## 2020-12-18 MED ORDER — APIXABAN 2.5 MG PO TABS
2.5000 mg | ORAL_TABLET | ORAL | Status: AC
  Administered 2020-12-18: 2.5 mg via ORAL
  Filled 2020-12-18: qty 1

## 2020-12-18 MED ORDER — ATORVASTATIN CALCIUM 40 MG PO TABS
40.0000 mg | ORAL_TABLET | Freq: Every day | ORAL | Status: DC
  Administered 2020-12-18 – 2020-12-23 (×6): 40 mg via ORAL
  Filled 2020-12-18 (×6): qty 1

## 2020-12-18 MED ORDER — APIXABAN 2.5 MG PO TABS
2.5000 mg | ORAL_TABLET | Freq: Two times a day (BID) | ORAL | Status: DC
  Administered 2020-12-18 – 2020-12-23 (×10): 2.5 mg via ORAL
  Filled 2020-12-18 (×10): qty 1

## 2020-12-18 NOTE — Progress Notes (Signed)
      INFECTIOUS DISEASE ATTENDING ADDENDUM:   Date: 12/18/2020  Patient name: Rebecca Pruitt  Rebecca record number: [id number]  Date of birth: 19-Feb-1929   I had lengthy discussion with the patients' daughter, [name].  She is concerned that pt is not back to baseline and that she will have to yet again be readmitted if pt dc again soon  She is concerned re the appearance of the urine with stringy "gello" like appearance she tells me over the phone (I was not on call when I received this message from primary)  I am very skeptical that by the time the pt arrived in the hospital that she had much to suggest infection with culture that was essentially sterile  I WOULD recommend getting CT chest abdomen and pelvis with contrast IF the patient is YET AGAIN "acting infected." as in a FUO workup  RE "UTI's" I feel in this patient specifically and in many patients in the health care system it is a simple and frequently incorrect explanation for patients cognitive decline. I would suggewst the latter is multifactorial.  Daughter has also called Urology.  I suspect the patient has underlying cognitive deficits and is having recurrent episodes of delirium which I suspect are not necessarily every single time  tied to infection at all   [facility] 12/18/2020, 5:48 PM

## 2020-12-18 NOTE — Progress Notes (Signed)
Appropriate Use Committee Chart Review  Chart reviewed by the physician advisor with input from [facility] [address] and the attending MD as needed for review of the appropriateness for SNF referral.  TOC notes, PT/OT/ST notes, nursing notes and physician notes reviewed for medical necessity to determine if the patient's needs are appropriate for short-term rehab to return to a prior level of function versus the likely need for custodial care.  At this time, the patient meets Medicare criteria for SNF placement.Given her age, and history of dementia and multiple co-morbidities, recommend beginning the discussion of trajectory of illness and likelihood of progressing to need LTC or 24/7 supervision going forward after rehab.   Recommendations: The patient is SNF appropriate for short-term rehab/skilled nursing interventions.     Jacquelynn Cree, MD Chief Physician Advisor  12/18/2020 9:17 AM

## 2020-12-18 NOTE — Discharge Instructions (Addendum)
Urinary Tract Infection, Adult A urinary tract infection (UTI) is an infection of any part of the urinary tract. The urinary tract includes:  The kidneys.  The ureters.  The bladder.  The urethra. These organs make, store, and get rid of pee (urine) in the body. What are the causes? This infection is caused by germs (bacteria) in your genital area. These germs grow and cause swelling (inflammation) of your urinary tract. What increases the risk? The following factors may make you more likely to develop this condition:  Using a small, thin tube (catheter) to drain pee.  Not being able to control when you pee or poop (incontinence).  Being female. If you are female, these things can increase the risk: ? Using these methods to prevent pregnancy:  A medicine that kills sperm (spermicide).  A device that blocks sperm (diaphragm). ? Having low levels of a female hormone (estrogen). ? Being pregnant. You are more likely to develop this condition if:  You have genes that add to your risk.  You are sexually active.  You take antibiotic medicines.  You have trouble peeing because of: ? A prostate that is bigger than normal, if you are female. ? A blockage in the part of your body that drains pee from the bladder. ? A kidney stone. ? A nerve condition that affects your bladder. ? Not getting enough to drink. ? Not peeing often enough.  You have other conditions, such as: ? Diabetes. ? A weak disease-fighting system (immune system). ? Sickle cell disease. ? Gout. ? Injury of the spine. What are the signs or symptoms? Symptoms of this condition include:  Needing to pee right away.  Peeing small amounts often.  Pain or burning when peeing.  Blood in the pee.  Pee that smells bad or not like normal.  Trouble peeing.  Pee that is cloudy.  Fluid coming from the vagina, if you are female.  Pain in the belly or lower back. Other symptoms  include:  Vomiting.  Not feeling hungry.  Feeling mixed up (confused). This may be the first symptom in older adults.  Being tired and grouchy (irritable).  A fever.  Watery poop (diarrhea). How is this treated?  Taking antibiotic medicine.  Taking other medicines.  Drinking enough water. In some cases, you may need to see a specialist. Follow these instructions at home: Medicines  Take over-the-counter and prescription medicines only as told by your doctor.  If you were prescribed an antibiotic medicine, take it as told by your doctor. Do not stop taking it even if you start to feel better. General instructions  Make sure you: ? Pee until your bladder is empty. ? Do not hold pee for a long time. ? Empty your bladder after sex. ? Wipe from front to back after peeing or pooping if you are a female. Use each tissue one time when you wipe.  Drink enough fluid to keep your pee pale yellow.  Keep all follow-up visits.   Contact a doctor if:  You do not get better after 1-2 days.  Your symptoms go away and then come back. Get help right away if:  You have very bad back pain.  You have very bad pain in your lower belly.  You have a fever.  You have chills.  You feeling like you will vomit or you vomit. Summary  A urinary tract infection (UTI) is an infection of any part of the urinary tract.  This condition is caused  by germs in your genital area.  There are many risk factors for a UTI.  Treatment includes antibiotic medicines.  Drink enough fluid to keep your pee pale yellow. This information is not intended to replace advice given to you by your health care provider. Make sure you discuss any questions you have with your health care provider. Document Revised: 05/18/2020 Document Reviewed: 05/18/2020 Elsevier Patient Education  [place] on my medicine - ELIQUIS (apixaban)  This medication education was reviewed with me or my  healthcare representative as part of my discharge preparation.    Why was Eliquis prescribed for you? Eliquis was prescribed for you to reduce the risk of a blood clot forming that can cause a stroke if you have a medical condition called atrial fibrillation (a type of irregular heartbeat).  What do You need to know about Eliquis ? Take your Eliquis TWICE DAILY - one tablet in the morning and one tablet in the evening with or without food. If you have difficulty swallowing the tablet whole please discuss with your pharmacist how to take the medication safely.  Take Eliquis exactly as prescribed by your doctor and DO NOT stop taking Eliquis without talking to the doctor who prescribed the medication.  Stopping may increase your risk of developing a stroke.  Refill your prescription before you run out.  After discharge, you should have regular check-up appointments with your healthcare provider that is prescribing your Eliquis.  In the future your dose may need to be changed if your kidney function or weight changes by a significant amount or as you get older.  What do you do if you miss a dose? If you miss a dose, take it as soon as you remember on the same day and resume taking twice daily.  Do not take more than one dose of ELIQUIS at the same time to make up a missed dose.  Important Safety Information A possible side effect of Eliquis is bleeding. You should call your healthcare provider right away if you experience any of the following: ? Bleeding from an injury or your nose that does not stop. ? Unusual colored urine (red or dark brown) or unusual colored stools (red or black). ? Unusual bruising for unknown reasons. ? A serious fall or if you hit your head (even if there is no bleeding).  Some medicines may interact with Eliquis and might increase your risk of bleeding or clotting while on Eliquis. To help avoid this, consult your healthcare provider or pharmacist prior to using  any new prescription or non-prescription medications, including herbals, vitamins, non-steroidal anti-inflammatory drugs (NSAIDs) and supplements.  This website has more information on Eliquis (apixaban): http://www.eliquis.com/eliquis/home

## 2020-12-18 NOTE — Progress Notes (Addendum)
ANTICOAGULATION CONSULT NOTE - [facility] for Heparin >> Apixaban (see addendum) Indication: atrial fibrillation  Patient Measurements: Height: 5\' 4"  (162.6 cm) Weight: 47.6 kg (104 lb 15 oz) IBW/kg (Calculated) : 54.7 Heparin Dosing Weight: 47.6 kg  Vital Signs: Temp: 98.2 F (36.8 C) (03/01 0437) Temp Source: Oral (03/01 0437) BP: 155/69 (03/01 0437) Pulse Rate: 60 (03/01 0437)  Labs: Recent Labs    12/16/20 0434 12/17/20 0354 12/18/20 0224  HGB 9.6* 9.6* 9.5*  HCT 31.2* 31.1* 31.2*  PLT 328 304 314  CREATININE 0.76 0.60 0.61    Estimated Creatinine Clearance: 35.8 mL/min (by C-G formula based on SCr of 0.61 mg/dL).   Assessment:  87 yr old female to begin IV heparin for atrial fibrillation. On Aspirin 81 mg and Clopidogrel 75 mg daily as PTA for hx multiple strokes.  Notes bruises easily.  Plan IV heparin tonight, monitor for bleeding, then discuss long-term anticoagulation based on heparin tolerance.  Heparin level this morning is SUBtherapeutic (HL<0.1). CBC stable - no bleeding noted. No bleeding or issues with the infusion noted per RN.   Addendum: Now with plans to transition Heparin to Apixaban. Will d/c Heparin drip and initiate Apixaban 2.5 mg bid.  12/18/2020 11:40 AM    Goal of Therapy:  Heparin level 0.3-0.7 units/ml Monitor platelets by anticoagulation protocol: Yes   Plan:  - Increase Heparin to 750 units/hr (7.5 ml/hr) - Will continue to monitor for any signs/symptoms of bleeding and will follow up with heparin level in 8 hours   Thank you for allowing pharmacy to be a part of this patient's care.  Alycia Rossetti, PharmD, BCPS Clinical Pharmacist Clinical phone for 12/18/2020: [phone] 12/18/2020 7:09 AM   **Pharmacist phone directory can now be found on [email or web address] ([email or web address]).  Listed under [place].

## 2020-12-18 NOTE — Progress Notes (Signed)
Physical Therapy Treatment Patient Details Name: Rebecca Pruitt MRN: [id number] DOB: [birth year] Today's Date: 12/18/2020    History of Present Illness [name] is a 87 y.o. female with medical history significant for recurrent UTI, chronic bladder outlet obstruction with large diverticulum s/p stent placement on 2/7,chronic atrial fibrillation , severe aortic stenosis/moderate mitral regurgitation, HTN, CVA, DM, HLD who was brought in by family for altered mental status.    PT Comments    Patient received in bed, asleep and a bit difficult to wake this morning. Legally blind and functionally deaf (deaf in one ear, very HOH in the other) and did not follow cues or make efforts at initiation to get to EOB. Unsure if lack of initiation is from vision loss, hearing loss, cognition, or a combination thereof. Needed totalAx2 for all mobility and had strong posterior lean at EOB. Left in bed positioned to comfort with all needs met, bed alarm active.    Follow Up Recommendations  SNF;Supervision/Assistance - 24 hour;Other (comment) (however family wanting to take her home- would need HHPT and 24/7A in that case)     [facility] bed;Wheelchair (measurements PT);Wheelchair cushion (measurements PT);Other (comment) (hoyer lift)    Recommendations for Other Services       Precautions / Restrictions Precautions Precautions: Fall Precaution Comments: legally blind, HOH in one ear and almost deaf in the other, dementia at baseline Restrictions Weight Bearing Restrictions: No    Mobility  Bed Mobility Overal bed mobility: Needs Assistance Bed Mobility: Supine to Sit;Sit to Supine Rolling: Total assist;+2 for physical assistance   Supine to sit: Total assist;+2 for physical assistance     General bed mobility comments: no real initiation of movement to edge of bed and posterior lean requiring Mod-maxA for upright at EOB    Transfers                  General transfer comment: unable at this time due to poor sitting balance  Ambulation/Gait             General Gait Details: unable   Stairs             Wheelchair Mobility    Modified Rankin (Stroke Patients Only)       Balance Overall balance assessment: Needs assistance Sitting-balance support: Bilateral upper extremity supported;Feet supported Sitting balance-Leahy Scale: Zero Sitting balance - Comments: no attempt to use UEs to assist with sitting balance. Unable to maintain sitting balance without max assist. Postural control: Posterior lean                                  Cognition Arousal/Alertness: Awake/alert Behavior During Therapy: Flat affect Overall Cognitive Status: Difficult to assess                                 General Comments: very difficult to assess cognition as she is mostly deaf and also legally blind- difficult to communicate with. Cannot really clarifiy if lack of cue/command following was cognition or her just not hearing commands.      Exercises      General Comments        Pertinent Vitals/Pain Pain Assessment: Faces Faces Pain Scale: No hurt Pain Intervention(s): Limited activity within patient's tolerance;Monitored during session    Home Living  Prior Function            PT Goals (current goals can now be found in the care plan section) Acute Rehab PT Goals Patient Stated Goal: to return to prior level of function PT Goal Formulation: With family Time For Goal Achievement: 12/28/20 Potential to Achieve Goals: Fair Progress towards PT goals: Not progressing toward goals - comment (cognition/communication)    Frequency    Min 3X/week      PT Plan Discharge plan needs to be updated    Co-evaluation              AM-PAC PT "6 Clicks" Mobility   Outcome Measure  Help needed turning from your back to your side while in a flat bed without  using bedrails?: Total Help needed moving from lying on your back to sitting on the side of a flat bed without using bedrails?: Total Help needed moving to and from a bed to a chair (including a wheelchair)?: Total Help needed standing up from a chair using your arms (e.g., wheelchair or bedside chair)?: Total Help needed to walk in hospital room?: Total Help needed climbing 3-5 steps with a railing? : Total 6 Click Score: 6    End of Session   Activity Tolerance: Patient tolerated treatment well;Other (comment) (cognition limiting session) Patient left: in bed;with call bell/phone within reach;with bed alarm set Nurse Communication: Mobility status PT Visit Diagnosis: Muscle weakness (generalized) (M62.81);Other abnormalities of gait and mobility (R26.89)     Time: 7955-8316 PT Time Calculation (min) (ACUTE ONLY): 12 min  Charges:  $Therapeutic Activity: 8-22 mins                     Windell Norfolk, DPT, PN1   Supplemental Physical Therapist Amanda    Pager [phone] Acute Rehab Office [phone]

## 2020-12-18 NOTE — TOC Initial Note (Signed)
Transition of Care [facility]) - Initial/Assessment Note    Patient Details  Name: Rebecca Pruitt MRN: [id number] Date of Birth: [birth year]  [facility] CM/SW Contact:    Ninfa Meeker, RN Phone Number: 12/18/2020, 1:25 PM  Clinical Narrative:   Case Manager spoke with patient's daughter, [name] [phone], to discuss discharge needs. [name] states her mom has been active with [facility] prior nto hospitalization.They have Hospital bed, wheelchair and have purchased a hoyer lift, which she is actually returning, [name] says she doesn't need it. When patient is ready for discharge [name] and her husband will transport her home. CM will confirm resumption of services with [facility] liaison. TOC Team will continue to monitor.                 Expected Discharge Plan: [place] Barriers to Discharge: Continued Medical Work up   Patient Goals and CMS Choice        Expected Discharge Plan and Services Expected Discharge Plan: [place] Choice: [place] arrangements for the past 2 months: Single Family Home                           HH Arranged: RN,PT,OT (resumption of care) [place] Agency: [facility] Date [facility] Agency Contacted: 12/18/20      Prior Living Arrangements/Services Living arrangements for the past 2 months: Single Family Home Lives with:: Adult Children                   Activities of Daily Living      Permission Sought/Granted                  Emotional Assessment         Alcohol / Substance Use: Not Applicable Psych Involvement: No (comment)  Admission diagnosis:  Recurrent UTI [N39.0] Failure to thrive in adult [G28.3] Acute metabolic encephalopathy [M62.94] Patient Active Problem List   Diagnosis Date Noted  . Acute metabolic encephalopathy 76/54/6503  . Atrial fibrillation, chronic ([place]) 12/14/2020  . Anemia 12/14/2020  .  Hypokalemia 12/14/2020  . History of CVA (cerebrovascular accident) 12/14/2020  . Pressure injury of skin 11/24/2020  . UTI (urinary tract infection) 11/22/2020  . Debility 11/22/2020  . Dementia ([place]) 11/22/2020  . Abnormal cardiac valve 11/22/2020  . DM2 (diabetes mellitus, type 2) ([place]) 11/22/2020   PCP:  Vicenta Aly, [place] Pharmacy:   [id number] - [place], [address]  [id number] Phone: [phone] Fax: [phone]     Social Determinants of Health (SDOH) Interventions    Readmission Risk Interventions No flowsheet data found.

## 2020-12-18 NOTE — Progress Notes (Signed)
PROGRESS NOTE  [name]  DOB: 07-Aug-1929  PCP: Vicenta Aly, [place] HYW:[id number]  DOA: 12/14/2020  LOS: 3 days   Chief Complaint  Patient presents with  . Recurrent UTI  . Weakness   Brief narrative: Rebecca Pruitt is a 87 y.o. female with PMH significant for DM2, HTN, HLD, CVA, chronic A. fib, severe aortic stenosis, moderate mitral regurgitation, chronic bladder outlet obstruction with large diverticulum status post stent placement on 2/7, recurrent UTI.  Patient was brought to the ED by family on 2/25 for altered mental status.  2/3-2/11, patient was hospitalized for recurrent UTI and chronic bladder outlet obstruction with large diverticulum.   2/7, she underwent cystoscopy and stent placement by urology. 2/22, patient was at urologist office for stent removal but her urine looked infected and the stent was not removed.  She was sent home on empiric treatment with Augmentin. Urine culture came back positive with 50,000 colonies of ESBL E. Coli. 2/25, patient presented to ID clinic for follow-up for ESBL UTI, complaint of increased lethargy.  ID Dr. Juleen China sent her to ED for admission and treatment with IV antibiotics.  In the ED, patient had a temperature of 100.1, blood pressure elevated to 160s Labs with hemoglobin of 11, potassium low at 3.4, lactic acid normal at 1.2, urinalysis with turbid amber color urine, large amount of leukocytes, few bacteria. Admitted to hospitalist service on IV meropenem. 2/27, code stroke called for facial droop.  CT scan and MRI negative for acute ischemia/infarct. See below for details  Subjective: Patient was seen and examined this morning. Elderly Caucasian female with dementia..  Alert, awake, minimal words for me.  Not hypervolemic. No essential change in mental status for me.  Assessment/Plan: ESBL UTI -History of recurrent UTI related to chronic bladder obstruction, recent instrumentation -Presented with increased  lethargy.  Low-grade fever but normal WBC count and normal lactic acid level. -Per ID recommendation, patient is currently on IV meropenem -Urine culture did not show significant growth.  5 days of IV meropenem to complete today. Recent Labs  Lab 12/14/20 1248 12/14/20 1856 12/15/20 0315 12/16/20 0434 12/17/20 0354 12/18/20 0224  WBC 6.6  --  6.8 6.5 7.6 7.2  LATICACIDVEN  --  1.2  --   --   --   --    Acute metabolic encephalopathy History of dementia -At baseline, patient is legally blind but is able to walk with a walker.  Per daughter, she code recognize her family members, oriented to place but not oriented to time -On admission, patient was altered probably because of UTI as well as from the effect of mood altering medications. -Mental status is gradually improving.  -Continue supportive care for dementia.  Currently on hold are Celexa, Aricept and as needed Ativan  Code stroke - 2/27 History of CVA -2/27, code stroke was called for facial asymmetry.  Imagings did not show any acute ischemia/infarct.  Seen by neurology.  -Despite history of A. fib and multiple CVAs in the past, patient was not on anticoagulation.  She was on aspirin, Plavix and fenofibrate.  Daughter reported easy bruisability but.  No history of GI bleeding.  Unclear why she was not on statin.  -Neurology and cardiology recommendation appreciated -Patient started on Eliquis has been.  Aspirin is stopped, Plavix continued.  Statin started.   New diagnosis of atypical atrial flutter/coarse A. Fib Reported prior history of A. Fib -Cardiology consultation was obtained.  Started on Eliquis.  Severe aortic stenosis/moderate MR -Outpatient evaluation for  TAVR per cardiology  Dysphagia -Speech therapy evaluation obtained.  Currently on dysphagia one diet. -Per daughter, prior to this admission, patient was able to tolerate regular consistency.  Expect improvement with resolution of infection and improvement in  mental status.  Type 2 diabetes -HbA1C 7.5 on 11/23/20 -Home meds include Novolin 70/30 twice a day as needed only if blood sugar is more than 150 -Currently on Lantus 5 units daily and sliding scale insulin.  Continue the same.   Recent Labs  Lab 12/17/20 1210 12/17/20 1853 12/17/20 2211 12/18/20 0832 12/18/20 1123  GLUCAP 109* 189* 224* 152* 123*   Normocytic anemia -stable and chronic  Recent Labs    11/26/20 0450 11/26/20 1544 12/14/20 1248 12/15/20 0315 12/16/20 0434 12/17/20 0354 12/18/20 0224  HGB 8.0*   < > 11.0* 9.8* 9.6* 9.6* 9.5*  MCV 95.6   < > 98.2 90.1 91.2 91.2 91.2  VITAMINB12 504  --   --   --   --   --   --   FOLATE 8.1  --   --   --   --   --   --   FERRITIN 23  --   --   --   --   --   --   TIBC 234*  --   --   --   --   --   --   IRON 30  --   --   --   --   --   --   RETICCTPCT 1.8  --   --   --   --   --   --    < > = values in this interval not displayed.   Hypokalemia  -Potassium level improved today with replacement. Recent Labs  Lab 12/14/20 1248 12/15/20 0315 12/16/20 0434 12/17/20 0354 12/18/20 0224  K 3.4* 3.2* 4.2 4.1 4.2   Mobility: PT evaluation obtained.  SNF versus 24-hour supervision at home recommended. Code Status:   Code Status: Partial Code  Nutritional status: Body mass index is 18.01 kg/m.     Diet Order            DIET - DYS 1 Room service appropriate? No; Fluid consistency: Thin  Diet effective now                 DVT prophylaxis: apixaban (ELIQUIS) tablet 2.5 mg Start: 12/18/20 2200   Antimicrobials:  IV meropenem Fluid: None Consultants: ID Family Communication:  Not at bedside  Status is: Inpatient  Remains inpatient appropriate because: Pending cardiology input  Dispo: The patient is from: Home              Anticipated d/c is to: Home               Patient currently is not medically stable to d/c.   Difficult to place patient No   Infusions:  . meropenem (MERREM) IV 1 g (12/17/20 2305)     Scheduled Meds: . apixaban  2.5 mg Oral BID  . atorvastatin  40 mg Oral Daily  . clopidogrel  75 mg Oral Daily  . fenofibrate  160 mg Oral QHS  . insulin aspart  0-5 Units Subcutaneous QHS  . insulin aspart  0-9 Units Subcutaneous TID WC  . insulin glargine  5 Units Subcutaneous Daily    Antimicrobials: Anti-infectives (From admission, onward)   Start     Dose/Rate Route Frequency Ordered Stop   12/14/20 2000  meropenem (MERREM) 1 g  in sodium chloride 0.9 % 100 mL IVPB        1 g 200 mL/hr over 30 Minutes Intravenous Every 12 hours 12/14/20 1952 12/19/20 1959      PRN meds:    Objective: Vitals:   12/18/20 0437 12/18/20 0959  BP: (!) 155/69 (!) 148/71  Pulse: 60 72  Resp:  16  Temp: 98.2 F (36.8 C) 98.3 F (36.8 C)  SpO2: 95% 96%    Intake/Output Summary (Last 24 hours) at 12/18/2020 1151 Last data filed at 12/18/2020 0900 Gross per 24 hour  Intake 1003.37 ml  Output 1350 ml  Net -346.63 ml   Filed Weights   12/17/20 2028  Weight: 47.6 kg   Weight change:  Body mass index is 18.01 kg/m.   Physical Exam: General exam: Pleasant elderly Caucasian female with dementia.  Not in physical distress Skin: No rashes, lesions or ulcers. HEENT: Atraumatic, normocephalic, no obvious bleeding Lungs: Clear to auscultate bilaterally CVS: Regular rate and rhythm, no murmur GI/Abd soft, nontender, nondistended, bowel sound present CNS: Alert, awake, able to confirm her name.  Knows he is in the hospital.  Mental status seem like yesterday Psychiatry: Depressed look Extremities: No pedal edema, no calf tenderness  Data Review: I have personally reviewed the laboratory data and studies available.  Recent Labs  Lab 12/14/20 1248 12/15/20 0315 12/16/20 0434 12/17/20 0354 12/18/20 0224  WBC 6.6 6.8 6.5 7.6 7.2  NEUTROABS 4.0  --  3.3 3.5 3.6  HGB 11.0* 9.8* 9.6* 9.6* 9.5*  HCT 38.0 30.2* 31.2* 31.1* 31.2*  MCV 98.2 90.1 91.2 91.2 91.2  PLT 369 376 328 304 314    Recent Labs  Lab 12/14/20 1248 12/15/20 0315 12/16/20 0434 12/17/20 0354 12/18/20 0224  NA 142 138 141 140 137  K 3.4* 3.2* 4.2 4.1 4.2  CL 107 103 107 103 102  CO2 25 26 27 29 26   GLUCOSE 152* 122* 156* 136* 189*  BUN 16 16 18 16 20   CREATININE 0.73 0.58 0.76 0.60 0.61  CALCIUM 9.8 9.4 9.8 10.0 10.1    F/u labs ordered Unresulted Labs (From admission, onward)         None      Signed, Terrilee Croak, MD [facility] 12/18/2020

## 2020-12-18 NOTE — Progress Notes (Signed)
Progress Note  Patient Name: Rebecca Pruitt Date of Encounter: 12/18/2020  [facility] Cardiologist: Buford Dresser, MD   Subjective   Daughter not present. Unable to answer questions.  Inpatient Medications    Scheduled Meds: . atorvastatin  40 mg Oral Daily  . clopidogrel  75 mg Oral Daily  . fenofibrate  160 mg Oral QHS  . insulin aspart  0-5 Units Subcutaneous QHS  . insulin aspart  0-9 Units Subcutaneous TID WC  . insulin glargine  5 Units Subcutaneous Daily   Continuous Infusions: . heparin 750 Units/hr (12/18/20 1035)  . meropenem (MERREM) IV 1 g (12/17/20 2305)   PRN Meds: melatonin   Vital Signs    Vitals:   12/17/20 1846 12/17/20 2028 12/18/20 0437 12/18/20 0959  BP: (!) 149/87 (!) 138/43 (!) 155/69 (!) 148/71  Pulse: 70 (!) 58 60 72  Resp: 20   16  Temp: 97.8 F (36.6 C) 98 F (36.7 C) 98.2 F (36.8 C) 98.3 F (36.8 C)  TempSrc: Oral Oral Oral   SpO2: 97% 95% 95% 96%  Weight:  47.6 kg    Height:  5\' 4"  (1.626 m)      Intake/Output Summary (Last 24 hours) at 12/18/2020 1136 Last data filed at 12/18/2020 0900 Gross per 24 hour  Intake 1003.37 ml  Output 1350 ml  Net -346.63 ml   Last 3 Weights 12/17/2020 12/14/2020 11/23/2020  Weight (lbs) 104 lb 15 oz 105 lb 106 lb 11.2 oz  Weight (kg) 47.6 kg 47.628 kg 48.4 kg      Telemetry    Not on telemetry - Personally Reviewed  ECG    ECG today consistent with rate controlled atrial fibrillation - Personally Reviewed  Physical Exam   GEN: No acute distress.  Frail, eyes open but not interactive Neck: No JVD Cardiac: largely regular with occasional irregularity, no rubs, or gallops. 4/6 late peaking systolic murmur with obscured S2 Respiratory: Clear to auscultation bilaterally. GI: Soft, nontender, non-distended  MS: No edema; No deformity. Neuro:  Nonfocal  Psych: not interactive   Labs    High Sensitivity Troponin:  No results for input(s): TROPONINIHS in the last 720 hours.     Chemistry Recent Labs  Lab 12/14/20 1248 12/15/20 0315 12/16/20 0434 12/17/20 0354 12/18/20 0224  NA 142   < > 141 140 137  K 3.4*   < > 4.2 4.1 4.2  CL 107   < > 107 103 102  CO2 25   < > 27 29 26   GLUCOSE 152*   < > 156* 136* 189*  BUN 16   < > 18 16 20   CREATININE 0.73   < > 0.76 0.60 0.61  CALCIUM 9.8   < > 9.8 10.0 10.1  PROT 6.3*  --   --   --   --   ALBUMIN 2.8*  --   --   --   --   AST 16  --   --   --   --   ALT 11  --   --   --   --   ALKPHOS 44  --   --   --   --   BILITOT 0.5  --   --   --   --   GFRNONAA >60   < > >60 >60 >60  ANIONGAP 10   < > 7 8 9    < > = values in this interval not displayed.     Hematology Recent  Labs  Lab 12/16/20 0434 12/17/20 0354 12/18/20 0224  WBC 6.5 7.6 7.2  RBC 3.42* 3.41* 3.42*  HGB 9.6* 9.6* 9.5*  HCT 31.2* 31.1* 31.2*  MCV 91.2 91.2 91.2  MCH 28.1 28.2 27.8  MCHC 30.8 30.9 30.4  RDW 14.3 14.3 14.6  PLT 328 304 314    BNPNo results for input(s): BNP, PROBNP in the last 168 hours.   DDimer No results for input(s): DDIMER in the last 168 hours.   Radiology    MR ANGIO HEAD WO CONTRAST  Result Date: 12/16/2020 CLINICAL DATA:  Neuro deficit, acute, stroke suspected EXAM: MRI HEAD WITHOUT CONTRAST MRA HEAD WITHOUT CONTRAST TECHNIQUE: Multiplanar, multiecho pulse sequences of the brain and surrounding structures were obtained without intravenous contrast. Angiographic images of the head were obtained using MRA technique without contrast. COMPARISON:  12/16/2020 and prior. FINDINGS: MRI HEAD FINDINGS Brain: No diffusion-weighted signal abnormality. No intracranial hemorrhage. No midline shift, ventriculomegaly or extra-axial fluid collection. No mass lesion. Moderate cerebral atrophy with ex vacuo dilatation. Chronic left pontine and right thalamic and bilateral corona radiata/centrum semiovale lacunar insults. Advanced chronic microvascular ischemic changes. Vascular: Please see MRA. Skull and upper cervical spine: Normal  marrow signal. Sinuses/Orbits: Sequela of bilateral lens replacement. Clear paranasal sinuses. Pneumatized mastoid air cells. Other: None. MRA HEAD FINDINGS Anterior circulation: Patent ICAs and ophthalmic artery origins. Patent ACAs, trifurcated. Patent MCAs. Mild bilateral M2 segment narrowing. Posterior circulation: Dominant left vertebral artery supplies the basilar. Patent left PICA. Right vertebral terminates as PICA. Diminutive basilar artery, patent. Patent superior cerebellar arteries with mild proximal narrowing on the left. Fetal origin of the bilateral PCAs. Mild left P3 segment narrowing. Anatomic variants: Please see above. IMPRESSION: MRI head: No acute intracranial process. Multifocal lacunar insults as detailed above. Moderate cerebral atrophy and advanced chronic microvascular ischemic changes. MRA head: No emergent vascular finding within the head. Mild bilateral M2, left superior cerebellar artery and left P3 segment narrowing. Electronically Signed   By: Primitivo Gauze M.D.   On: 12/16/2020 18:56   MR BRAIN WO CONTRAST  Result Date: 12/16/2020 CLINICAL DATA:  Neuro deficit, acute, stroke suspected EXAM: MRI HEAD WITHOUT CONTRAST MRA HEAD WITHOUT CONTRAST TECHNIQUE: Multiplanar, multiecho pulse sequences of the brain and surrounding structures were obtained without intravenous contrast. Angiographic images of the head were obtained using MRA technique without contrast. COMPARISON:  12/16/2020 and prior. FINDINGS: MRI HEAD FINDINGS Brain: No diffusion-weighted signal abnormality. No intracranial hemorrhage. No midline shift, ventriculomegaly or extra-axial fluid collection. No mass lesion. Moderate cerebral atrophy with ex vacuo dilatation. Chronic left pontine and right thalamic and bilateral corona radiata/centrum semiovale lacunar insults. Advanced chronic microvascular ischemic changes. Vascular: Please see MRA. Skull and upper cervical spine: Normal marrow signal. Sinuses/Orbits:  Sequela of bilateral lens replacement. Clear paranasal sinuses. Pneumatized mastoid air cells. Other: None. MRA HEAD FINDINGS Anterior circulation: Patent ICAs and ophthalmic artery origins. Patent ACAs, trifurcated. Patent MCAs. Mild bilateral M2 segment narrowing. Posterior circulation: Dominant left vertebral artery supplies the basilar. Patent left PICA. Right vertebral terminates as PICA. Diminutive basilar artery, patent. Patent superior cerebellar arteries with mild proximal narrowing on the left. Fetal origin of the bilateral PCAs. Mild left P3 segment narrowing. Anatomic variants: Please see above. IMPRESSION: MRI head: No acute intracranial process. Multifocal lacunar insults as detailed above. Moderate cerebral atrophy and advanced chronic microvascular ischemic changes. MRA head: No emergent vascular finding within the head. Mild bilateral M2, left superior cerebellar artery and left P3 segment narrowing. Electronically Signed  By: Primitivo Gauze M.D.   On: 12/16/2020 18:56   CT HEAD CODE STROKE WO CONTRAST  Result Date: 12/16/2020 CLINICAL DATA:  Code stroke.  Neuro deficit, acute, stroke suspected EXAM: CT HEAD WITHOUT CONTRAST TECHNIQUE: Contiguous axial images were obtained from the base of the skull through the vertex without intravenous contrast. COMPARISON:  11/22/2020 and prior. FINDINGS: Brain: No acute infarct or intracranial hemorrhage. No mass lesion. No midline shift, ventriculomegaly or extra-axial fluid collection. Diffuse parenchymal volume loss with ex vacuo dilatation. Chronic microvascular ischemic changes. Vascular: No hyperdense vessel or unexpected calcification. Bilateral skull base atherosclerotic calcifications. Skull: No acute finding.  Sequela of right mastoidectomy. Sinuses/Orbits: Normal orbits. Clear paranasal sinuses. No mastoid effusion. Other: None. ASPECTS [facility] Stroke Program Early CT Score) - Ganglionic level infarction (caudate, lentiform nuclei, internal  capsule, insula, M1-M3 cortex): 7 - Supraganglionic infarction (M4-M6 cortex): 3 Total score (0-10 with 10 being normal): 10 IMPRESSION: 1. No acute intracranial process. Advanced chronic microvascular ischemic changes. 2. ASPECTS is 10 3. Code stroke imaging results were communicated on 12/16/2020 at 5:28 pm to provider Dr. Rory Percy via secure text paging. Electronically Signed   By: Primitivo Gauze M.D.   On: 12/16/2020 17:29    Cardiac Studies   Echo 11/23/20: 1. Left ventricular ejection fraction, by estimation, is 65 to 70%. The  left ventricle has normal function. The left ventricle has no regional  wall motion abnormalities. There is severe left ventricular hypertrophy.  Left ventricular diastolic parameters  are consistent with Grade II diastolic dysfunction (pseudonormalization).  2. Right ventricular systolic function is normal. The right ventricular  size is normal. There is normal pulmonary artery systolic pressure.  3. Left atrial size was severely dilated.  4. Pericardial effusion - 1.1 cm. Moderate pericardial effusion. The  pericardial effusion is lateral to the left ventricle. There is no  evidence of cardiac tamponade.  5. The mitral valve is normal in structure. Moderate mitral valve  regurgitation. No evidence of mitral stenosis. The mean mitral valve  gradient is 4.0 mmHg. Severe mitral annular calcification.  6. The aortic valve is tricuspid. Aortic valve regurgitation is not  visualized. Severe aortic valve stenosis. Aortic valve area, by VTI  measures 0.84 cm. Aortic valve mean gradient measures 38.3 mmHg. Aortic  valve Vmax measures 3.97 m/s.  7. The inferior vena cava is normal in size with greater than 50%  respiratory variability, suggesting right atrial pressure of 3 mmHg.   Patient Profile     87 y.o. female with PMH 5 prior CVAs, vascular dementia, hypertension, hyperlipidemia, type II diabetes, recurrent UTI/chronic bladder obstruction who is  followed in consultation for severe aortic stenosis and assessment for anticoagulation.  Assessment & Plan    New diagnosis of atypical atrial flutter/coarse atrial fibrillation, reported prior history of atrial fibrillation -started on heparin overnight, tolerating. CBC stable this AM. Will transition to DOAC, suspect 2.5 mg BID given age, weight. Stop heparin once DOAC started. -rate controlled -was on aspirin and clopidogrel previously given history of stroke -given age, risk of bleeding, would recommend stopping aspirin and continuing clopidogrel plus DOAC. Will start this today, monitor -chadsvasc=7  Severe aortic stenosis:  -per daughter, was asymptomatic prior to recent health decline -needs to recover from current infection prior to evaluation to see if she is a TAVR candidate   Moderate MR -monitor as outpatient, is not volume overloaded  Pericardial effusion -monitor as outpatient, no evidence of clinical tamponade  Hypertension: -beta blocker and lisinopril previously held  for hypotension. Monitor  Hyperlipidemia: -continue fibrate. Statin mentioned previously but unclear what she was taking most recently. Note from 08/01/20 in care everywhere states atorvastatin 40 mg daily. Will restart today.  Recurrent UTI: per primary team/ID  For questions or updates, please contact [place] Please consult [email or web address] for contact info under     Signed, Buford Dresser, MD  12/18/2020, 11:36 AM

## 2020-12-19 DIAGNOSIS — Q248 Other specified congenital malformations of heart: Secondary | ICD-10-CM | POA: Diagnosis not present

## 2020-12-19 LAB — CULTURE, BLOOD (ROUTINE X 2)
Culture: NO GROWTH
Culture: NO GROWTH

## 2020-12-19 LAB — GLUCOSE, CAPILLARY
Glucose-Capillary: 142 mg/dL — ABNORMAL HIGH (ref 70–99)
Glucose-Capillary: 244 mg/dL — ABNORMAL HIGH (ref 70–99)
Glucose-Capillary: 183 mg/dL — ABNORMAL HIGH (ref 70–99)
Glucose-Capillary: 179 mg/dL — ABNORMAL HIGH (ref 70–99)

## 2020-12-19 NOTE — Progress Notes (Signed)
Progress Note  Patient Name: Rebecca Pruitt Date of Encounter: 12/19/2020  [facility] Cardiologist: Buford Dresser, MD   Subjective   Patient sleeping comfortably in bed, awakens to voice. Daughter not present at bedside, so I spoke with her over the phone, see below.  Inpatient Medications    Scheduled Meds: . apixaban  2.5 mg Oral BID  . atorvastatin  40 mg Oral Daily  . fenofibrate  160 mg Oral QHS  . insulin aspart  0-5 Units Subcutaneous QHS  . insulin aspart  0-9 Units Subcutaneous TID WC  . insulin glargine  5 Units Subcutaneous Daily   Continuous Infusions: . meropenem (MERREM) IV 1 g (12/19/20 0803)   PRN Meds: melatonin   Vital Signs    Vitals:   12/18/20 1733 12/18/20 2004 12/19/20 0451 12/19/20 0842  BP: (!) 155/56 (!) 148/49 (!) 152/55 (!) 118/54  Pulse: 62 70 77 68  Resp: 16 15 16 18   Temp: 98.3 F (36.8 C) 98.2 F (36.8 C) 98.7 F (37.1 C) 98.4 F (36.9 C)  TempSrc:   Oral   SpO2: 94% 96% 96% 98%  Weight:  47.6 kg    Height:        Intake/Output Summary (Last 24 hours) at 12/19/2020 1002 Last data filed at 12/19/2020 0900 Gross per 24 hour  Intake 480 ml  Output 1525 ml  Net -1045 ml   Last 3 Weights 12/18/2020 12/17/2020 12/14/2020  Weight (lbs) 104 lb 15.1 oz 104 lb 15 oz 105 lb  Weight (kg) 47.603 kg 47.6 kg 47.628 kg      Telemetry    Not on telemetry - Personally Reviewed  ECG    ECG 12/19/20 consistent with rate controlled atrial fibrillation - Personally Reviewed  Physical Exam   GEN: Frail, in no acute distress NECK: No JVD CARDIAC: rate controlled, largely regular sounding on exam with occasional irregularity, normal S1 and S2, no rubs or gallops. 4/6 late peaking systolic murmur with obscured S2. VASCULAR: Radial pulses 2+ bilaterally.  RESPIRATORY:  Clear to auscultation without rales, wheezing or rhonchi  ABDOMEN: Soft, non-tender, non-distended SKIN: Warm and dry, no edema PSYCHIATRIC:  Opens eyes to voice but  minimally interactive  Labs    High Sensitivity Troponin:  No results for input(s): TROPONINIHS in the last 720 hours.    Chemistry Recent Labs  Lab 12/14/20 1248 12/15/20 0315 12/16/20 0434 12/17/20 0354 12/18/20 0224  NA 142   < > 141 140 137  K 3.4*   < > 4.2 4.1 4.2  CL 107   < > 107 103 102  CO2 25   < > 27 29 26   GLUCOSE 152*   < > 156* 136* 189*  BUN 16   < > 18 16 20   CREATININE 0.73   < > 0.76 0.60 0.61  CALCIUM 9.8   < > 9.8 10.0 10.1  PROT 6.3*  --   --   --   --   ALBUMIN 2.8*  --   --   --   --   AST 16  --   --   --   --   ALT 11  --   --   --   --   ALKPHOS 44  --   --   --   --   BILITOT 0.5  --   --   --   --   GFRNONAA >60   < > >60 >60 >60  ANIONGAP 10   < >  7 8 9    < > = values in this interval not displayed.     Hematology Recent Labs  Lab 12/16/20 0434 12/17/20 0354 12/18/20 0224  WBC 6.5 7.6 7.2  RBC 3.42* 3.41* 3.42*  HGB 9.6* 9.6* 9.5*  HCT 31.2* 31.1* 31.2*  MCV 91.2 91.2 91.2  MCH 28.1 28.2 27.8  MCHC 30.8 30.9 30.4  RDW 14.3 14.3 14.6  PLT 328 304 314    BNPNo results for input(s): BNP, PROBNP in the last 168 hours.   DDimer No results for input(s): DDIMER in the last 168 hours.   Radiology    No results found.  Cardiac Studies   Echo 11/23/20: 1. Left ventricular ejection fraction, by estimation, is 65 to 70%. The  left ventricle has normal function. The left ventricle has no regional  wall motion abnormalities. There is severe left ventricular hypertrophy.  Left ventricular diastolic parameters  are consistent with Grade II diastolic dysfunction (pseudonormalization).  2. Right ventricular systolic function is normal. The right ventricular  size is normal. There is normal pulmonary artery systolic pressure.  3. Left atrial size was severely dilated.  4. Pericardial effusion - 1.1 cm. Moderate pericardial effusion. The  pericardial effusion is lateral to the left ventricle. There is no  evidence of cardiac  tamponade.  5. The mitral valve is normal in structure. Moderate mitral valve  regurgitation. No evidence of mitral stenosis. The mean mitral valve  gradient is 4.0 mmHg. Severe mitral annular calcification.  6. The aortic valve is tricuspid. Aortic valve regurgitation is not  visualized. Severe aortic valve stenosis. Aortic valve area, by VTI  measures 0.84 cm. Aortic valve mean gradient measures 38.3 mmHg. Aortic  valve Vmax measures 3.97 m/s.  7. The inferior vena cava is normal in size with greater than 50%  respiratory variability, suggesting right atrial pressure of 3 mmHg.   Patient Profile     87 y.o. female with PMH 5 prior CVAs, vascular dementia, hypertension, hyperlipidemia, type II diabetes, recurrent UTI/chronic bladder obstruction who is followed in consultation for severe aortic stenosis and assessment for anticoagulation.  Assessment & Plan    New diagnosis of atypical atrial flutter/coarse atrial fibrillation, reported prior history of atrial fibrillation -rate controlled on no agents -tolerating apixaban 2.5 mg BID -was on aspirin and clopidogrel previously given history of stroke. Initially stopped aspirin with initiation of DOAC. Appreciated pharmacy assistance, they spoke to Dr. Leonie Man who recommended stopping clopidogrel as well and continuing on DOAC alone. -chadsvasc=7 -I spoke to patient's daughter [name] on the phone and relayed the above information and plan for anticoagulation and cessation of antiplatelets. All questions answered.  Severe aortic stenosis:  -per daughter, was asymptomatic prior to recent health decline -needs to recover from current infection prior to evaluation to see if she is a TAVR candidate   Moderate MR -monitor as outpatient, is not volume overloaded  Pericardial effusion -monitor as outpatient, no evidence of clinical tamponade  Hypertension: -beta blocker and lisinopril previously held for hypotension.  Monitor  Hyperlipidemia: -continue fibrate. Restarted atorvastatin 40 mg daily this admission.  Recurrent UTI: per primary team/ID  [facility] will sign off.   Medication Recommendations:  Continue apixaban 2.5 mg BID, fenofibrate, atorvastatin.  Was previously on aspirin, clopidogrel prior to admission. Please make sure these are not continued on discharge given start of apixaban.  Has not required lasix while admitted, would change this to PRN for shortness of breath, swelling, weight gain at discharge.  Other recommendations (  labs, testing, etc):  none Follow up as an outpatient:  We will arrange for outpatient follow up with me and referral to TAVR team from there  For questions or updates, please contact [place] Please consult [email or web address] for contact info under     Signed, Buford Dresser, MD  12/19/2020, 10:02 AM

## 2020-12-19 NOTE — Progress Notes (Signed)
PROGRESS NOTE  [name] IEP:[id number] DOB: 1928/11/22 DOA: 12/14/2020 PCP: Vicenta Aly, FNP  HPI/Recap of past 24 hours: Vertell Novak is a 87 y.o. female with PMH significant for DM2, HTN, HLD, CVA, chronic A. fib, severe aortic stenosis, moderate mitral regurgitation, chronic bladder outlet obstruction with large diverticulum status post stent placement on 2/7, recurrent UTI.  Patient was brought to the ED by family on 2/25 for altered mental status.  2/3-2/11, patient was hospitalized for recurrent UTI and chronicbladder outlet obstruction with large diverticulum.  2/7, she underwent cystoscopy and stent placement by urology. 2/22, patient was at urologist office for stent removal but her urine looked infected and the stent was not removed.  She was sent home on empiric treatment with Augmentin. Urine culture came back positive with 50,000 colonies of ESBL E. Coli. 2/25, patient presented to ID clinic for follow-up for ESBL UTI, complaint of increased lethargy.  ID Dr. Juleen China sent her to ED for admission and treatment with IV antibiotics.  In the ED, patient had a temperature of 100.1, urinalysis with turbid amber color urine, large amount of leukocytes, few bacteria.  Admitted to hospitalist service on IV meropenem. 2/27, code stroke called for facial droop.  CT scan and MRI negative for acute ischemia/infarct.  12/19/20: Seen and examined at bedside.  Somnolent this morning but easily arousable to voices.  She does not respond to questions appropriately, appears confused.   Assessment/Plan: Active Problems:   Abnormal cardiac valve   DM2 (diabetes mellitus, type 2) (HCC)   Acute metabolic encephalopathy   Atrial fibrillation, chronic (HCC)   Anemia   Hypokalemia   History of CVA (cerebrovascular accident)  ESBL E. coli UTI, POA Seen by infectious disease, recommending 5 days of meropenem. Blood cultures obtained on 12/14/2020 -, final. She is currently  afebrile with no leukocytosis.  Newly diagnosed atrial flutter/A. Fib Reported prior history of A. fib She is currently not on any rate control agents. She is on Eliquis for secondary CVA prevention.  Acute metabolic encephalopathy in the setting of dementia. At baseline the patient is legally blind, oriented to place but not oriented to time and can recognize family members. Reorient as needed  Severe aortic stenosis/moderate mitral regurgitation. Outpatient evaluation for TAVR per cardiology.  History of CVA She had a code stroke on 12/16/2020. Imaging did not reveal acute ischemia or infarct. Seen by neurology and cardiology.  Dysphagia with history of prior stroke. Seen by speech therapist with recommendation for dysphagia 1 diet. Continue aspiration precautions.  Type 2 diabetes with hyperglycemia Hemoglobin A1c 7.5 on 11/23/2020. Continue Lantus and insulin sliding scale.  Resolved post repletion: Hypokalemia Serum potassium 4.2 from 3.2.  Physical debility Seen by PT OT with recommendation for SNF. TOC assisting with SNF placement. Continue fall precautions.   Code Status: Partial code, no CPR.  Family Communication: None at bedside.  Disposition Plan: Likely will discharge to SNF.   Consultants:  Cardiology  Infectious disease.  Neurology.  Procedures:  None  Antimicrobials:  Meropenem.  DVT prophylaxis: Eliquis.  Status is: Inpatient    Dispo: The patient is from: Home.              Anticipated d/c is to: SNF. Anticipated date of discharge: 12/20/2020.  After she completes her course of meropenem.              Patient currently not stable for discharge due to ongoing treatment for ESBL E. coli   Difficult to place patient:  N/A        Objective: Vitals:   12/18/20 2004 12/19/20 0451 12/19/20 0842 12/19/20 1630  BP: (!) 148/49 (!) 152/55 (!) 118/54 (!) 125/53  Pulse: 70 77 68 67  Resp: 15 16 18 18   Temp: 98.2 F (36.8 C) 98.7 F  (37.1 C) 98.4 F (36.9 C) 98.2 F (36.8 C)  TempSrc:  Oral  Oral  SpO2: 96% 96% 98% 98%  Weight: 47.6 kg     Height:        Intake/Output Summary (Last 24 hours) at 12/19/2020 1636 Last data filed at 12/19/2020 1300 Gross per 24 hour  Intake 480 ml  Output 750 ml  Net -270 ml   Filed Weights   12/17/20 2028 12/18/20 2004  Weight: 47.6 kg 47.6 kg    Exam:  . General: 87 y.o. year-old female chronically ill-appearing in no acute distress.  Asleep but easily arousable to voices. . Cardiovascular: Irregular rate and rhythm with no rubs or gallops.  No thyromegaly or JVD noted.   Marland Kitchen Respiratory: Clear to auscultation with no wheezes or rales.  Poor inspiratory effort. . Abdomen: Soft nontender nondistended with normal bowel sounds x4 quadrants. . Musculoskeletal: No lower extremity edema bilaterally. . Skin: No ulcerative lesions noted or rashes. . Psychiatry: Mood is appropriate for condition and setting   Data Reviewed: CBC: Recent Labs  Lab 12/14/20 1248 12/15/20 0315 12/16/20 0434 12/17/20 0354 12/18/20 0224  WBC 6.6 6.8 6.5 7.6 7.2  NEUTROABS 4.0  --  3.3 3.5 3.6  HGB 11.0* 9.8* 9.6* 9.6* 9.5*  HCT 38.0 30.2* 31.2* 31.1* 31.2*  MCV 98.2 90.1 91.2 91.2 91.2  PLT 369 376 328 304 110   Basic Metabolic Panel: Recent Labs  Lab 12/14/20 1248 12/15/20 0315 12/16/20 0434 12/17/20 0354 12/18/20 0224  NA 142 138 141 140 137  K 3.4* 3.2* 4.2 4.1 4.2  CL 107 103 107 103 102  CO2 25 26 27 29 26   GLUCOSE 152* 122* 156* 136* 189*  BUN 16 16 18 16 20   CREATININE 0.73 0.58 0.76 0.60 0.61  CALCIUM 9.8 9.4 9.8 10.0 10.1   GFR: Estimated Creatinine Clearance: 35.8 mL/min (by C-G formula based on SCr of 0.61 mg/dL). Liver Function Tests: Recent Labs  Lab 12/14/20 1248  AST 16  ALT 11  ALKPHOS 44  BILITOT 0.5  PROT 6.3*  ALBUMIN 2.8*   No results for input(s): LIPASE, AMYLASE in the last 168 hours. No results for input(s): AMMONIA in the last 168  hours. Coagulation Profile: No results for input(s): INR, PROTIME in the last 168 hours. Cardiac Enzymes: No results for input(s): CKTOTAL, CKMB, CKMBINDEX, TROPONINI in the last 168 hours. BNP (last 3 results) No results for input(s): PROBNP in the last 8760 hours. HbA1C: No results for input(s): HGBA1C in the last 72 hours. CBG: Recent Labs  Lab 12/18/20 1615 12/18/20 2006 12/19/20 0636 12/19/20 1110 12/19/20 1627  GLUCAP 223* 247* 142* 244* 183*   Lipid Profile: Recent Labs    12/17/20 0354  CHOL 129  HDL 39*  LDLCALC 78  TRIG 58  CHOLHDL 3.3   Thyroid Function Tests: No results for input(s): TSH, T4TOTAL, FREET4, T3FREE, THYROIDAB in the last 72 hours. Anemia Panel: No results for input(s): VITAMINB12, FOLATE, FERRITIN, TIBC, IRON, RETICCTPCT in the last 72 hours. Urine analysis:    Component Value Date/Time   COLORURINE AMBER (A) 12/14/2020 1949   APPEARANCEUR TURBID (A) 12/14/2020 1949   LABSPEC 1.011 12/14/2020 1949  PHURINE 6.0 12/14/2020 1949   GLUCOSEU NEGATIVE 12/14/2020 1949   HGBUR SMALL (A) 12/14/2020 1949   BILIRUBINUR NEGATIVE 12/14/2020 1949   KETONESUR NEGATIVE 12/14/2020 1949   PROTEINUR 100 (A) 12/14/2020 1949   UROBILINOGEN 0.2 05/26/2008 0817   NITRITE NEGATIVE 12/14/2020 1949   LEUKOCYTESUR LARGE (A) 12/14/2020 1949   Sepsis Labs: @LABRCNTIP (procalcitonin:4,lacticidven:4)  ) Recent Results (from the past 240 hour(s))  Urine culture     Status: Abnormal   Collection Time: 12/14/20  7:49 PM   Specimen: Urine, Random  Result Value Ref Range Status   Specimen Description URINE, RANDOM  Final   Special Requests NONE  Final   Culture (A)  Final    <10,000 COLONIES/mL INSIGNIFICANT GROWTH Performed at [facility], [place] [address] [place], [place] [address]    Report Status 12/16/2020 FINAL  Final  Resp Panel by RT-PCR (Flu A&B, Covid) Nasopharyngeal Swab     Status: None   Collection Time: 12/14/20  8:01 PM   Specimen:  Nasopharyngeal Swab; Nasopharyngeal(NP) swabs in vial transport medium  Result Value Ref Range Status   SARS Coronavirus 2 by RT PCR NEGATIVE NEGATIVE Final    Comment: (NOTE) SARS-CoV-2 target nucleic acids are NOT DETECTED.  The SARS-CoV-2 RNA is generally detectable in upper respiratory specimens during the acute phase of infection. The lowest concentration of SARS-CoV-2 viral copies this assay can detect is 138 copies/mL. A negative result does not preclude SARS-Cov-2 infection and should not be used as the sole basis for treatment or other patient management decisions. A negative result may occur with  improper specimen collection/handling, submission of specimen other than nasopharyngeal swab, presence of viral mutation(s) within the areas targeted by this assay, and inadequate number of viral copies(<138 copies/mL). A negative result must be combined with clinical observations, patient history, and epidemiological information. The expected result is Negative.  Fact Sheet for Patients:  EntrepreneurPulse.com.au  Fact Sheet for Healthcare Providers:  IncredibleEmployment.be  This test is no t yet approved or cleared by the [place] FDA and  has been authorized for detection and/or diagnosis of SARS-CoV-2 by FDA under an Emergency Use Authorization (EUA). This EUA will remain  in effect (meaning this test can be used) for the duration of the COVID-19 declaration under Section 564(b)(1) of the Act, 21 U.S.C.section 360bbb-3(b)(1), unless the authorization is terminated  or revoked sooner.       Influenza A by PCR NEGATIVE NEGATIVE Final   Influenza B by PCR NEGATIVE NEGATIVE Final    Comment: (NOTE) The Xpert Xpress SARS-CoV-2/FLU/RSV plus assay is intended as an aid in the diagnosis of influenza from Nasopharyngeal swab specimens and should not be used as a sole basis for treatment. Nasal washings and aspirates are unacceptable for  Xpert Xpress SARS-CoV-2/FLU/RSV testing.  Fact Sheet for Patients: EntrepreneurPulse.com.au  Fact Sheet for Healthcare Providers: IncredibleEmployment.be  This test is not yet approved or cleared by the [place] FDA and has been authorized for detection and/or diagnosis of SARS-CoV-2 by FDA under an Emergency Use Authorization (EUA). This EUA will remain in effect (meaning this test can be used) for the duration of the COVID-19 declaration under Section 564(b)(1) of the Act, 21 U.S.C. section 360bbb-3(b)(1), unless the authorization is terminated or revoked.  Performed at [facility], [place] [address], [place], [place] [address]   Culture, blood (routine x 2)     Status: None   Collection Time: 12/14/20  8:01 PM   Specimen: BLOOD RIGHT WRIST  Result Value Ref Range Status   Specimen Description BLOOD RIGHT WRIST  Final   Special Requests   Final    BOTTLES DRAWN AEROBIC AND ANAEROBIC Blood Culture results may not be optimal due to an inadequate volume of blood received in culture bottles   Culture   Final    NO GROWTH 5 DAYS Performed at [facility] [address], [place], [place] [address]    Report Status 12/19/2020 FINAL  Final  Culture, blood (routine x 2)     Status: None   Collection Time: 12/14/20 10:27 PM   Specimen: BLOOD LEFT ARM  Result Value Ref Range Status   Specimen Description BLOOD LEFT ARM  Final   Special Requests   Final    AEROBIC BOTTLE ONLY Blood Culture results may not be optimal due to an inadequate volume of blood received in culture bottles   Culture   Final    NO GROWTH 5 DAYS Performed at [facility], [place] [address], [place], [place] [address]    Report Status 12/19/2020 FINAL  Final      Studies: No results found.  Scheduled Meds: . apixaban  2.5 mg Oral BID  . atorvastatin  40 mg Oral Daily  . fenofibrate  160 mg Oral QHS  . insulin aspart  0-5 Units Subcutaneous QHS  .  insulin aspart  0-9 Units Subcutaneous TID WC  . insulin glargine  5 Units Subcutaneous Daily    Continuous Infusions: . meropenem (MERREM) IV 1 g (12/19/20 0803)     LOS: 4 days     Kayleen Memos, MD [facility] Pager [phone]  If 7PM-7AM, please contact night-coverage [email or web address] Password [facility] 12/19/2020, 4:36 PM

## 2020-12-19 NOTE — Plan of Care (Signed)

## 2020-12-20 DIAGNOSIS — Q248 Other specified congenital malformations of heart: Secondary | ICD-10-CM | POA: Diagnosis not present

## 2020-12-20 LAB — GLUCOSE, CAPILLARY
Glucose-Capillary: 157 mg/dL — ABNORMAL HIGH (ref 70–99)
Glucose-Capillary: 144 mg/dL — ABNORMAL HIGH (ref 70–99)
Glucose-Capillary: 213 mg/dL — ABNORMAL HIGH (ref 70–99)
Glucose-Capillary: 120 mg/dL — ABNORMAL HIGH (ref 70–99)

## 2020-12-20 MED ORDER — ATORVASTATIN CALCIUM 40 MG PO TABS: 40.0000 mg | ORAL_TABLET | Freq: Every day | ORAL | 0 refills | Status: AC

## 2020-12-20 MED ORDER — APIXABAN 2.5 MG PO TABS: 2.5000 mg | ORAL_TABLET | Freq: Two times a day (BID) | ORAL | 1 refills | Status: DC

## 2020-12-20 MED ORDER — DONEPEZIL HCL 10 MG PO TABS
10.0000 mg | ORAL_TABLET | Freq: Every day | ORAL | Status: DC
Start: 2020-12-20 — End: 2020-12-23
  Administered 2020-12-20 – 2020-12-22 (×3): 10 mg via ORAL
  Filled 2020-12-20 (×3): qty 1

## 2020-12-20 MED ORDER — CITALOPRAM HYDROBROMIDE 20 MG PO TABS
20.0000 mg | ORAL_TABLET | Freq: Every day | ORAL | Status: DC
  Administered 2020-12-20 – 2020-12-21 (×2): 20 mg via ORAL
  Filled 2020-12-20 (×2): qty 1

## 2020-12-20 MED ORDER — MELATONIN 3 MG PO TABS: 3.0000 mg | ORAL_TABLET | Freq: Every evening | ORAL | 0 refills | Status: AC | PRN

## 2020-12-20 NOTE — Progress Notes (Signed)
Physical Therapy Treatment Patient Details Name: Rebecca Pruitt MRN: [id number] DOB: 09-23-1929 Today's Date: 12/20/2020    History of Present Illness Rebecca Pruitt is a 87 y.o. female with medical history significant for recurrent UTI, chronic bladder outlet obstruction with large diverticulum s/p stent placement on 2/7,chronic atrial fibrillation , severe aortic stenosis/moderate mitral regurgitation, HTN, CVA, DM, HLD who was brought in by family for altered mental status.    PT Comments    Patient received in bed sleeping- able to wake with repeated tactile cues and speaking extremely loudly, but still unable to get her to follow cues today. Does not perform or initiate even rote tasks such as rolling or washing her face with a washcloth despite max cues and encouragement plus heavy tactile cuing. Needed totalA for rolling. Basically no cue following capacity. Very challenging patient to work with and frankly unlikely to make significant progress given combination of vision loss, hearing loss, and cognitive status (delerium on dementia)- given these factors we are unable to accurately or efficiently communicate with patient or provide cues or instructions for mobility. Left in bed with all needs met. Signing off at this time- continue to strongly recommend hospital bed, WC and cushion, and hoyer lift with pad for safety of patient and family during mobility at home.     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 and almost deaf in the other, dementia at baseline Restrictions Weight  Bearing Restrictions: No    Mobility  Bed Mobility Overal bed mobility: Needs Assistance Bed Mobility: Rolling Rolling: Total assist         General bed mobility comments: no cue following or initiation of movement to EOB- did not improve with repeated rolling and tactile cues    Transfers                 General transfer comment: deferred  Ambulation/Gait             General Gait Details: unable   Stairs             Wheelchair Mobility    Modified Rankin (Stroke Patients Only)       Balance Overall balance assessment: Needs assistance Sitting-balance support: Bilateral upper extremity supported;Feet supported Sitting balance-Leahy Scale: Zero         Standing balance comment: unable to assess                            Cognition Arousal/Alertness: Awake/alert Behavior During Therapy: Flat affect Overall Cognitive Status: Difficult to assess                                 General Comments: very difficult to assess cognition as she is mostly deaf and also legally blind- difficult to communicate with. Cannot really clarifiy if lack of cue/command following was cognition or her just not hearing commands.      Exercises      General Comments        Pertinent Vitals/Pain Pain Assessment: Faces Faces Pain Scale: No hurt Pain  Intervention(s): Limited activity within patient's tolerance;Monitored during session    Home Living                      Prior Function            PT Goals (current goals can now be found in the care plan section) Acute Rehab PT Goals Patient Stated Goal: to return to prior level of function PT Goal Formulation: With family Time For Goal Achievement: 12/28/20 Potential to Achieve Goals: Fair Progress towards PT goals: Not progressing toward goals - comment (cognition/communication issues)    Frequency     (sign off)      PT Plan Current plan remains appropriate     Co-evaluation              AM-PAC PT "6 Clicks" Mobility   Outcome Measure  Help needed turning from your back to your side while in a flat bed without using bedrails?: Total Help needed moving from lying on your back to sitting on the side of a flat bed without using bedrails?: Total Help needed moving to and from a bed to a chair (including a wheelchair)?: Total Help needed standing up from a chair using your arms (e.g., wheelchair or bedside chair)?: Total Help needed to walk in hospital room?: Total Help needed climbing 3-5 steps with a railing? : Total 6 Click Score: 6    End of Session   Activity Tolerance: Patient tolerated treatment well;Other (comment) (limited by cognition) Patient left: in bed;with call bell/phone within reach (too light for bed alarm to activate) Nurse Communication: Mobility status PT Visit Diagnosis: Muscle weakness (generalized) (M62.81);Other abnormalities of gait and mobility (R26.89)     Time: 3338-3291 PT Time Calculation (min) (ACUTE ONLY): 9 min  Charges:  $Therapeutic Activity: 8-22 mins                     Windell Norfolk, DPT, PN1   Supplemental Physical Therapist [place]    Pager [phone] Acute Rehab Office [phone]

## 2020-12-20 NOTE — Progress Notes (Signed)
  Speech Language Pathology Treatment: Dysphagia  Patient Details Name: Rebecca Pruitt MRN: [id number] DOB: [birth year] Today's Date: 12/20/2020 Time: 4818-5631 SLP Time Calculation (min) (ACUTE ONLY): 16 min  Assessment / Plan / Recommendation Clinical Impression  Followed up for dysphagia intervention. Pt seen with puree and thin liquid lunch tray as well as upgraded PO snack of softer solids from SLP. Pt with overt cough with initial sip of thin liquids via straw (suspected to be due to impulsivity as consecutive sips evidenced). Smaller sips of thin liquids via straw were without overt difficulty. Pt with difficulty obtaining labial seal with thins via cup despite cueing. Puree textures were unremarkable. Pt able to adequately mastication softer solid and exhibit adequate oral clearance. Recommend dysphagia 2 (finely chopped) and thin liquids with meds crushed in puree. Nursing in agreement.    HPI HPI: Rebecca Pruitt is a 87 y.o. female with medical history significant for recurrent UTI, chronic bladder outlet obstruction with large diverticulum s/p stent placement on 2/7,chronic atrial fibrillation , severe aortic stenosis/moderate mitral regurgitation, HTN, CVA, DM, HLD who was brought in by family for altered mental status.      SLP Plan  Continue with current plan of care       Recommendations  Diet recommendations: Dysphagia 2 (fine chop);Thin liquid Liquids provided via: Straw;Cup Medication Administration: Crushed with puree Supervision: Staff to assist with self feeding;Full supervision/cueing for compensatory strategies Compensations: Minimize environmental distractions;Small sips/bites;Slow rate Postural Changes and/or Swallow Maneuvers: Seated upright 90 degrees;Upright 30-60 min after meal                Oral Care Recommendations: Oral care BID;Staff/trained caregiver to provide oral care Follow up Recommendations: 24 hour supervision/assistance;Skilled Nursing  facility SLP Visit Diagnosis: Dysphagia, unspecified (R13.10) Plan: Continue with current plan of care       Rebecca Pruitt, Rebecca Pruitt   12/20/2020, 12:36 PM

## 2020-12-20 NOTE — Discharge Summary (Signed)
Discharge Summary  [facility] ZJQ:[id number] DOB: 1929-01-23  PCP: Vicenta Aly, FNP  Admit date: 12/14/2020 Discharge date: 12/20/2020  Time spent: 35 minutes.  Recommendations for Outpatient Follow-up:  1. Follow-up with cardiology in 1 to 2 weeks. 2. Follow-up with urology in 1 to 2 weeks. 3. Follow-up with palliative care medicine in 1 to 2 weeks. 4. Follow-up with your PCP in 1 to 2 weeks. 5. Continue PT OT with assistance and fall precautions. 6. Take your medications as prescribed.  Discharge Diagnoses:  Active Hospital Problems   Diagnosis Date Noted  . Acute metabolic encephalopathy 24/06/7352  . Atrial fibrillation, chronic ([place]) 12/14/2020  . Anemia 12/14/2020  . Hypokalemia 12/14/2020  . History of CVA (cerebrovascular accident) 12/14/2020  . Abnormal cardiac valve 11/22/2020  . DM2 (diabetes mellitus, type 2) ([place]) 11/22/2020    Resolved Hospital Problems  No resolved problems to display.    Discharge Condition: Stable.  Diet recommendation: Heart healthy carb modified diet.  Vitals:   12/19/20 2035 12/20/20 0504  BP: 134/87 (!) 157/63  Pulse: 60 76  Resp: 18 18  Temp: 98.7 F (37.1 C) 98.3 F (36.8 C)  SpO2: 95% 95%    History of present illness:  [place] Makofskeis a 87 y.o.femalewith PMH significant for DM2, HTN, HLD, CVA, chronic A. fib, severe aortic stenosis, moderate pericardial effusion with no tamponade, moderate mitral regurgitation, chronic bladder outlet obstruction with large diverticulum status post stent placement on 11/26/20, recurrent UTI.  Patient was brought to the ED by family on 12/14/20 for altered mental status and generalized weakness.  2/3-2/11/22, patient was hospitalized for recurrent UTI and chronicbladder outlet obstruction with large diverticulum.  11/26/20, she underwent cystoscopy and stent placement by urology. 12/11/20, patient was at urologist office for stent removal but her urine looked infected and the  stent was not removed. She was sent home on empiric treatment with Augmentin. Urine culture came back positive with 50,000 colonies of ESBL E. Coli. 12/14/20, patient presented to ID clinic for follow-up for ESBL UTI, complaint of increased lethargy. ID Dr. Juleen China sent her to ED for admission and treatment with IV antibiotics.  She has completed 5 days of meropenem.  Significant events:  12/16/20, code stroke called for facial droop. CT scan head and MRI brain negative for acute ischemia/infarct.  Hospital course complicated by generalized weakness.  She was assessed by PT OT with recommendation for SNF.  Daughter preference is to bring home with home health services.  TOC consulted to assist with arrangement for PT OT, RN, home health aide, CSW.  12/20/20:  Patient was seen and examined at her bedside.  She was asleep and arouses to voices.  She is minimally interactive today.  Discussed case with cardiology, Dr. Harrell Gave, plan is to follow-up outpatient for possible TAVR if she is a candidate.  Discussed with urology, Dr. Milford Cage, urology will arrange for follow-up next week outpatient for possible stent removal.  Hospital Course:  Active Problems:   Abnormal cardiac valve   DM2 (diabetes mellitus, type 2) (HCC)   Acute metabolic encephalopathy   Atrial fibrillation, chronic (HCC)   Anemia   Hypokalemia   History of CVA (cerebrovascular accident)  Treated ESBL E. coli UTI, POA Seen by infectious disease, recommended 5 days of meropenem. Blood cultures obtained on 12/14/2020, negative final. Urine culture obtained on 12/14/2020, less than 10,000 colonies, insignificant growth, final. She is currently afebrile with no leukocytosis. Nonseptic appearing, no evidence of active infective process.  Newly diagnosed atypical atrial  flutter/coarse atrial fibrillation CHA2DS2-VASc score 7 Reported prior history of A. fib She is currently not on any rate control agents. She is on Eliquis for  secondary CVA prevention. Stop aspirin and Plavix, per Dr. Leonie Man, neurologist recommendation. Continue Eliquis alone.  Severe aortic stenosis/moderate pericardial effusion/moderate mitral regurgitation. Outpatient evaluation for TAVR per cardiology. Per cardiology needs to recover from current infection prior to evaluation to see if she is a TAVR Candidate.  Acute metabolic encephalopathy in the setting of dementia and prior history of stroke. At baseline the patient is legally blind, oriented to place, makes jokes, and can recognize family members. Reorient as needed Continue fall precautions Mobilize as tolerated with fall precautions  History of CVA She had a code stroke on 12/16/2020 in the hospital. Imaging did not reveal acute ischemia or infarct. Seen by neurology.  Dysphagia with history of prior stroke. Seen by speech therapist with recommendation for dysphagia 2 (fine chop); Thin liquids, provided via straw, cup.   Medication administration: Crushed with pure. Staff to assist with self-feeding. Full supervision, minimize environmental distractions, small sips/bites/slow rate. Seated upright 90 degrees, keep upright 30 to 60 minutes after meal. Continue aspiration precautions.  Type 2 diabetes with hyperglycemia Hemoglobin A1c 7.5 on 11/23/2020. Resume home regimen at discharge.  Chronic anxiety/depression Continue home Celexa Periodically assess QTC Last QTC 444 on 12/20/2020. Follow-up with your PCP  Dementia Continue home Aricept Follow-up with your PCP  Resolved post repletion: Hypokalemia Serum potassium 4.2 from 3.2.  Physical debility Seen by PT OT with recommendation for SNF. Daughter preference is to bring home with home health services.  TOC consulted to assist with arrangement for PT OT, RN, home health aide, CSW. Continue fall precautions, on blood thinners.  Goals of care Patient and family would benefit from palliative care medicine follow-up  outpatient. TOC consulted to assist with referral to palliative care team outpatient. Appreciate TOC's assistance.  Code Status: Partial code, no CPR.  Family Communication:  Updated her daughter via phone on 12/20/2020.  Disposition Plan:  Will discharge to home with home health services on 12/21/2020.   Consultants:  Cardiology  Infectious disease.  Neurology.  Procedures:  None  Antimicrobials:  Meropenem.  DVT prophylaxis: Eliquis.   Discharge Exam: BP (!) 157/63 (BP Location: Right Arm)   Pulse 76   Temp 98.3 F (36.8 C) (Oral)   Resp 18   Ht 5\' 4"  (1.626 m)   Wt 47.6 kg   SpO2 95%   BMI 18.01 kg/m  . General: 87 y.o. year-old female frail-appearing in no acute distress.  Sleeping and easily arousable to voices. . Cardiovascular: Regular rate and rhythm with no rubs or gallops.  No thyromegaly or JVD noted.   Marland Kitchen Respiratory: Clear to auscultation with no wheezes or rales. Good inspiratory effort. . Abdomen: Soft nontender nondistended with normal bowel sounds x4 quadrants. . Musculoskeletal: No lower extremity edema bilaterally. Marland Kitchen Psychiatry: Mood is appropriate for condition and setting  Discharge Instructions You were cared for by a hospitalist during your hospital stay. If you have any questions about your discharge medications or the care you received while you were in the hospital after you are discharged, you can call the unit and asked to speak with the hospitalist on call if the hospitalist that took care of you is not available. Once you are discharged, your primary care physician will handle any further medical issues. Please note that NO REFILLS for any discharge medications will be authorized once you are discharged,  as it is imperative that you return to your primary care physician (or establish a relationship with a primary care physician if you do not have one) for your aftercare needs so that they can reassess your need for medications and  monitor your lab values.   Allergies as of 12/20/2020      Reactions   Codeine    Syncope       Medication List    STOP taking these medications   amoxicillin-clavulanate 875-125 MG tablet Commonly known as: AUGMENTIN   aspirin 81 MG tablet   clopidogrel 75 MG tablet Commonly known as: PLAVIX   LORazepam 0.5 MG tablet Commonly known as: ATIVAN     TAKE these medications   apixaban 2.5 MG Tabs tablet Commonly known as: ELIQUIS Take 1 tablet (2.5 mg total) by mouth 2 (two) times daily.   atorvastatin 40 MG tablet Commonly known as: LIPITOR Take 1 tablet (40 mg total) by mouth daily. Start taking on: December 21, 2020   B-D ULTRAFINE III SHORT PEN 31G X 8 MM Misc Generic drug: Insulin Pen Needle See admin instructions.   citalopram 20 MG tablet Commonly known as: CELEXA Take 20 mg by mouth at bedtime.   Contour Next Test test strip Generic drug: glucose blood 1 each 3 (three) times daily.   CRANBERRY PO Take 4,200 mg by mouth 2 (two) times daily.   diphenhydrAMINE 25 MG tablet Commonly known as: BENADRYL Take 25 mg by mouth every 6 (six) hours as needed for sleep.   donepezil 10 MG tablet Commonly known as: ARICEPT Take 10 mg by mouth at bedtime.   fenofibrate 160 MG tablet Take 160 mg by mouth at bedtime.   furosemide 20 MG tablet Commonly known as: LASIX Take 20 mg by mouth daily.   insulin NPH-regular Human (70-30) 100 UNIT/ML injection Inject 1-7 Units into the skin 2 (two) times daily as needed (high blood sugar). If blood sugar is over 150 takes 1 unit ; sliding scale   melatonin 3 MG Tabs tablet Take 1 tablet (3 mg total) by mouth at bedtime as needed for up to 20 days.   OXYGEN Inhale 2 L into the lungs at bedtime.            Durable Medical Equipment  (From admission, onward)         Start     Ordered   12/20/20 1233  For home use only DME Hospital bed  Once       Question Answer Comment  Length of Need 12 Months   The above  medical condition requires: Patient requires the ability to reposition frequently   Head must be elevated greater than: 30 degrees   Bed type Semi-electric   Hoyer Lift Yes      12/20/20 1234   12/20/20 1233  For home use only DME standard manual wheelchair with seat cushion  Once       Comments: Patient suffers from ambulatory dysfunction which impairs their ability to perform daily activities like walking in the home.  A walker will not resolve issue with performing activities of daily living. A wheelchair will allow patient to safely perform daily activities. Patient can safely propel the wheelchair in the home or has a caregiver who can provide assistance. Length of need 12 months. Accessories: elevating leg rests, wheel locks, extensions and anti-tippers.   12/20/20 1234         Allergies  Allergen Reactions  . Codeine  Syncope     Follow-up Information    [facility] up.   Specialty: [place] Why: HHRN/PT/OT/SW services to resume on discharge Contact information: [place] [address] [phone]        Vicenta Aly, [place]. Call in 1 day(s).   Specialty: Nurse Practitioner Why: Please call for a post hospital follow-up appointment. Contact information: [place] [address] [phone]        Buford Dresser, MD. Call in 1 day(s).   Specialty: Cardiology Why: Please call for a post hospital follow-up appointment. Contact information: [address] [place] [address] [phone]        Remi Haggard, MD. Call in 1 day(s).   Specialty: Urology Why: Please call for a post hospital follow-up appointment. Contact information: [place]. [place] [address] [phone]                The results of significant diagnostics from this hospitalization (including imaging, microbiology, ancillary and laboratory) are listed below for  reference.    Significant Diagnostic Studies: CT HEAD WO CONTRAST  Result Date: 11/22/2020 CLINICAL DATA:  Focal neuro deficit greater than 6 hours. EXAM: CT HEAD WITHOUT CONTRAST TECHNIQUE: Contiguous axial images were obtained from the base of the skull through the vertex without intravenous contrast. COMPARISON:  CT head 05/25/2008 FINDINGS: Brain: Generalized atrophy. Extensive white matter hypodensity bilaterally, chronic. Negative for acute infarct, hemorrhage, mass. Vascular: Negative for hyperdense vessel Skull: Negative Sinuses/Orbits: Paranasal sinuses clear. Right mastoidectomy. Left mastoid clear. Bilateral cataract extraction. Other: None IMPRESSION: Atrophy and chronic white matter changes.  No acute abnormality. Electronically Signed   By: Franchot Gallo M.D.   On: 11/22/2020 17:40   MR ANGIO HEAD WO CONTRAST  Result Date: 12/16/2020 CLINICAL DATA:  Neuro deficit, acute, stroke suspected EXAM: MRI HEAD WITHOUT CONTRAST MRA HEAD WITHOUT CONTRAST TECHNIQUE: Multiplanar, multiecho pulse sequences of the brain and surrounding structures were obtained without intravenous contrast. Angiographic images of the head were obtained using MRA technique without contrast. COMPARISON:  12/16/2020 and prior. FINDINGS: MRI HEAD FINDINGS Brain: No diffusion-weighted signal abnormality. No intracranial hemorrhage. No midline shift, ventriculomegaly or extra-axial fluid collection. No mass lesion. Moderate cerebral atrophy with ex vacuo dilatation. Chronic left pontine and right thalamic and bilateral corona radiata/centrum semiovale lacunar insults. Advanced chronic microvascular ischemic changes. Vascular: Please see MRA. Skull and upper cervical spine: Normal marrow signal. Sinuses/Orbits: Sequela of bilateral lens replacement. Clear paranasal sinuses. Pneumatized mastoid air cells. Other: None. MRA HEAD FINDINGS Anterior circulation: Patent ICAs and ophthalmic artery origins. Patent ACAs, trifurcated. Patent  MCAs. Mild bilateral M2 segment narrowing. Posterior circulation: Dominant left vertebral artery supplies the basilar. Patent left PICA. Right vertebral terminates as PICA. Diminutive basilar artery, patent. Patent superior cerebellar arteries with mild proximal narrowing on the left. Fetal origin of the bilateral PCAs. Mild left P3 segment narrowing. Anatomic variants: Please see above. IMPRESSION: MRI head: No acute intracranial process. Multifocal lacunar insults as detailed above. Moderate cerebral atrophy and advanced chronic microvascular ischemic changes. MRA head: No emergent vascular finding within the head. Mild bilateral M2, left superior cerebellar artery and left P3 segment narrowing. Electronically Signed   By: Primitivo Gauze M.D.   On: 12/16/2020 18:56   MR BRAIN WO CONTRAST  Result Date: 12/16/2020 CLINICAL DATA:  Neuro deficit, acute, stroke suspected EXAM: MRI HEAD WITHOUT CONTRAST MRA HEAD WITHOUT CONTRAST TECHNIQUE: Multiplanar, multiecho pulse sequences  of the brain and surrounding structures were obtained without intravenous contrast. Angiographic images of the head were obtained using MRA technique without contrast. COMPARISON:  12/16/2020 and prior. FINDINGS: MRI HEAD FINDINGS Brain: No diffusion-weighted signal abnormality. No intracranial hemorrhage. No midline shift, ventriculomegaly or extra-axial fluid collection. No mass lesion. Moderate cerebral atrophy with ex vacuo dilatation. Chronic left pontine and right thalamic and bilateral corona radiata/centrum semiovale lacunar insults. Advanced chronic microvascular ischemic changes. Vascular: Please see MRA. Skull and upper cervical spine: Normal marrow signal. Sinuses/Orbits: Sequela of bilateral lens replacement. Clear paranasal sinuses. Pneumatized mastoid air cells. Other: None. MRA HEAD FINDINGS Anterior circulation: Patent ICAs and ophthalmic artery origins. Patent ACAs, trifurcated. Patent MCAs. Mild bilateral M2 segment  narrowing. Posterior circulation: Dominant left vertebral artery supplies the basilar. Patent left PICA. Right vertebral terminates as PICA. Diminutive basilar artery, patent. Patent superior cerebellar arteries with mild proximal narrowing on the left. Fetal origin of the bilateral PCAs. Mild left P3 segment narrowing. Anatomic variants: Please see above. IMPRESSION: MRI head: No acute intracranial process. Multifocal lacunar insults as detailed above. Moderate cerebral atrophy and advanced chronic microvascular ischemic changes. MRA head: No emergent vascular finding within the head. Mild bilateral M2, left superior cerebellar artery and left P3 segment narrowing. Electronically Signed   By: Primitivo Gauze M.D.   On: 12/16/2020 18:56   DG Chest Portable 1 View  Result Date: 11/22/2020 CLINICAL DATA:  Weakness. EXAM: PORTABLE CHEST 1 VIEW COMPARISON:  05/25/2008 FINDINGS: The heart size is mildly enlarged. Aortic calcifications are noted. There is no pneumothorax. No large pleural effusion. No acute osseous abnormality. IMPRESSION: 1. No acute cardiopulmonary disease. 2. Mild cardiomegaly. 3. Aortic calcifications. Electronically Signed   By: Constance Holster M.D.   On: 11/22/2020 16:52   DG C-Arm 1-60 Min-No Report  Result Date: 11/26/2020 Fluoroscopy was utilized by the requesting physician.  No radiographic interpretation.   ECHOCARDIOGRAM COMPLETE  Result Date: 11/23/2020    ECHOCARDIOGRAM REPORT   Patient Name:   Rebecca Pruitt Date of Exam: 11/23/2020 Medical Rec #:  [id number]         Height:       64.0 in Accession #:    [id number]        Weight:       132.0 lb Date of Birth:  June 30, 1929         BSA:          1.640 m Patient Age:    28 years          BP:           142/66 mmHg Patient Gender: F                 HR:           50 bpm. Exam Location:  Inpatient Procedure: 2D Echo, 3D Echo, Cardiac Doppler and Color Doppler Indications:    I35.0 Nonrheumatic aortic (valve) stenosis  History:         Patient has prior history of Echocardiogram examinations, most                 recent 05/26/2008. CAD, Aortic Valve Disease and Mitral Valve                 Disease, Signs/Symptoms:Altered Mental Status and Murmur; Risk                 Factors:Hypertension and Diabetes. Cancer. Dementia.  Sonographer:    [place] Referring Phys: [phone]  ANASTASSIA DOUTOVA  Sonographer Comments: Patient could not follow directions. IMPRESSIONS  1. Left ventricular ejection fraction, by estimation, is 65 to 70%. The left ventricle has normal function. The left ventricle has no regional wall motion abnormalities. There is severe left ventricular hypertrophy. Left ventricular diastolic parameters  are consistent with Grade II diastolic dysfunction (pseudonormalization).  2. Right ventricular systolic function is normal. The right ventricular size is normal. There is normal pulmonary artery systolic pressure.  3. Left atrial size was severely dilated.  4. Pericardial effusion - 1.1 cm. Moderate pericardial effusion. The pericardial effusion is lateral to the left ventricle. There is no evidence of cardiac tamponade.  5. The mitral valve is normal in structure. Moderate mitral valve regurgitation. No evidence of mitral stenosis. The mean mitral valve gradient is 4.0 mmHg. Severe mitral annular calcification.  6. The aortic valve is tricuspid. Aortic valve regurgitation is not visualized. Severe aortic valve stenosis. Aortic valve area, by VTI measures 0.84 cm. Aortic valve mean gradient measures 38.3 mmHg. Aortic valve Vmax measures 3.97 m/s.  7. The inferior vena cava is normal in size with greater than 50% respiratory variability, suggesting right atrial pressure of 3 mmHg. FINDINGS  Left Ventricle: Left ventricular ejection fraction, by estimation, is 65 to 70%. The left ventricle has normal function. The left ventricle has no regional wall motion abnormalities. The left ventricular internal cavity size was normal in size. There is   severe left ventricular hypertrophy. Left ventricular diastolic parameters are consistent with Grade II diastolic dysfunction (pseudonormalization). Right Ventricle: The right ventricular size is normal. No increase in right ventricular wall thickness. Right ventricular systolic function is normal. There is normal pulmonary artery systolic pressure. The tricuspid regurgitant velocity is 2.41 m/s, and  with an assumed right atrial pressure of 3 mmHg, the estimated right ventricular systolic pressure is 51.8 mmHg. Left Atrium: Left atrial size was severely dilated. Right Atrium: Right atrial size was normal in size. Pericardium: Pericardial effusion - 1.1 cm. A moderately sized pericardial effusion is present. The pericardial effusion is lateral to the left ventricle. There is no evidence of cardiac tamponade. Mitral Valve: The mitral valve is normal in structure. There is moderate thickening of the mitral valve leaflet(s). There is moderate calcification of the mitral valve leaflet(s). Severe mitral annular calcification. Moderate mitral valve regurgitation. No evidence of mitral valve stenosis. MV peak gradient, 11.8 mmHg. The mean mitral valve gradient is 4.0 mmHg. Tricuspid Valve: The tricuspid valve is normal in structure. Tricuspid valve regurgitation is not demonstrated. No evidence of tricuspid stenosis. Aortic Valve: The aortic valve is tricuspid. Aortic valve regurgitation is not visualized. Severe aortic stenosis is present. Aortic valve mean gradient measures 38.3 mmHg. Aortic valve peak gradient measures 63.1 mmHg. Aortic valve area, by VTI measures  0.84 cm. Pulmonic Valve: The pulmonic valve was normal in structure. Pulmonic valve regurgitation is trivial. No evidence of pulmonic stenosis. Aorta: The aortic root is normal in size and structure. Venous: The inferior vena cava is normal in size with greater than 50% respiratory variability, suggesting right atrial pressure of 3 mmHg. IAS/Shunts: No  atrial level shunt detected by color flow Doppler.  LEFT VENTRICLE PLAX 2D LVIDd:         2.50 cm     Diastology LVIDs:         1.70 cm     LV e' medial:    3.00 cm/s LV PW:         2.20 cm  LV E/e' medial:  46.3 LV IVS:        1.90 cm     LV e' lateral:   3.50 cm/s LVOT diam:     2.00 cm     LV E/e' lateral: 39.7 LV SV:         94 LV SV Index:   57 LVOT Area:     3.14 cm  LV Volumes (MOD) LV vol d, MOD A2C: 50.7 ml LV vol d, MOD A4C: 58.5 ml LV vol s, MOD A2C: 19.4 ml LV vol s, MOD A4C: 22.0 ml LV SV MOD A2C:     31.3 ml LV SV MOD A4C:     58.5 ml LV SV MOD BP:      38.6 ml RIGHT VENTRICLE            IVC RV S prime:     8.18 cm/s  IVC diam: 1.60 cm TAPSE (M-mode): 1.8 cm LEFT ATRIUM             Index       RIGHT ATRIUM          Index LA diam:        4.30 cm 2.62 cm/m  RA Area:     9.23 cm LA Vol (A2C):   72.6 ml 44.28 ml/m RA Volume:   15.40 ml 9.39 ml/m LA Vol (A4C):   78.5 ml 47.88 ml/m LA Biplane Vol: 74.5 ml 45.44 ml/m  AORTIC VALVE                    PULMONIC VALVE AV Area (Vmax):    1.21 cm     PR End Diast Vel: 2.23 msec AV Area (Vmean):   0.87 cm AV Area (VTI):     0.84 cm AV Vmax:           397.33 cm/s AV Vmean:          291.000 cm/s AV VTI:            1.117 m AV Peak Grad:      63.1 mmHg AV Mean Grad:      38.3 mmHg LVOT Vmax:         153.00 cm/s LVOT Vmean:        80.700 cm/s LVOT VTI:          0.299 m LVOT/AV VTI ratio: 0.27  AORTA Ao Root diam: 3.00 cm Ao Asc diam:  3.10 cm MITRAL VALVE                 TRICUSPID VALVE MV Area (PHT): 1.50 cm      TR Peak grad:   23.2 mmHg MV Area VTI:   1.35 cm      TR Vmax:        241.00 cm/s MV Peak grad:  11.8 mmHg MV Mean grad:  4.0 mmHg      SHUNTS MV Vmax:       1.72 m/s      Systemic VTI:  0.30 m MV Vmean:      91.5 cm/s     Systemic Diam: 2.00 cm MV Decel Time: 507 msec MR Peak grad:    134.6 mmHg MR Mean grad:    82.0 mmHg MR Vmax:         580.00 cm/s MR Vmean:        417.0 cm/s MR PISA:         4.02 cm MR PISA Eff ROA: 26  mm MR PISA Radius:   0.80 cm MV E velocity: 139.00 cm/s MV A velocity: 108.00 cm/s MV E/A ratio:  1.29 Candee Furbish MD Electronically signed by Candee Furbish MD Signature Date/Time: 11/23/2020/11:24:30 AM    Final    CT HEAD CODE STROKE WO CONTRAST  Result Date: 12/16/2020 CLINICAL DATA:  Code stroke.  Neuro deficit, acute, stroke suspected EXAM: CT HEAD WITHOUT CONTRAST TECHNIQUE: Contiguous axial images were obtained from the base of the skull through the vertex without intravenous contrast. COMPARISON:  11/22/2020 and prior. FINDINGS: Brain: No acute infarct or intracranial hemorrhage. No mass lesion. No midline shift, ventriculomegaly or extra-axial fluid collection. Diffuse parenchymal volume loss with ex vacuo dilatation. Chronic microvascular ischemic changes. Vascular: No hyperdense vessel or unexpected calcification. Bilateral skull base atherosclerotic calcifications. Skull: No acute finding.  Sequela of right mastoidectomy. Sinuses/Orbits: Normal orbits. Clear paranasal sinuses. No mastoid effusion. Other: None. ASPECTS [facility] Stroke Program Early CT Score) - Ganglionic level infarction (caudate, lentiform nuclei, internal capsule, insula, M1-M3 cortex): 7 - Supraganglionic infarction (M4-M6 cortex): 3 Total score (0-10 with 10 being normal): 10 IMPRESSION: 1. No acute intracranial process. Advanced chronic microvascular ischemic changes. 2. ASPECTS is 10 3. Code stroke imaging results were communicated on 12/16/2020 at 5:28 pm to provider Dr. Rory Percy via secure text paging. Electronically Signed   By: Primitivo Gauze M.D.   On: 12/16/2020 17:29    Microbiology: Recent Results (from the past 240 hour(s))  Urine culture     Status: Abnormal   Collection Time: 12/14/20  7:49 PM   Specimen: Urine, Random  Result Value Ref Range Status   Specimen Description URINE, RANDOM  Final   Special Requests NONE  Final   Culture (A)  Final    <10,000 COLONIES/mL INSIGNIFICANT GROWTH Performed at [facility], [address] [place], [place] [address]    Report Status 12/16/2020 FINAL  Final  Resp Panel by RT-PCR (Flu A&B, Covid) Nasopharyngeal Swab     Status: None   Collection Time: 12/14/20  8:01 PM   Specimen: Nasopharyngeal Swab; Nasopharyngeal(NP) swabs in vial transport medium  Result Value Ref Range Status   SARS Coronavirus 2 by RT PCR NEGATIVE NEGATIVE Final    Comment: (NOTE) SARS-CoV-2 target nucleic acids are NOT DETECTED.  The SARS-CoV-2 RNA is generally detectable in upper respiratory specimens during the acute phase of infection. The lowest concentration of SARS-CoV-2 viral copies this assay can detect is 138 copies/mL. A negative result does not preclude SARS-Cov-2 infection and should not be used as the sole basis for treatment or other patient management decisions. A negative result may occur with  improper specimen collection/handling, submission of specimen other than nasopharyngeal swab, presence of viral mutation(s) within the areas targeted by this assay, and inadequate number of viral copies(<138 copies/mL). A negative result must be combined with clinical observations, patient history, and epidemiological information. The expected result is Negative.  Fact Sheet for Patients:  EntrepreneurPulse.com.au  Fact Sheet for Healthcare Providers:  IncredibleEmployment.be  This test is no t yet approved or cleared by the [place] FDA and  has been authorized for detection and/or diagnosis of SARS-CoV-2 by FDA under an Emergency Use Authorization (EUA). This EUA will remain  in effect (meaning this test can be used) for the duration of the COVID-19 declaration under Section 564(b)(1) of the Act, 21 U.S.C.section 360bbb-3(b)(1), unless the authorization is terminated  or revoked sooner.       Influenza A by PCR NEGATIVE NEGATIVE  Final   Influenza B by PCR NEGATIVE NEGATIVE Final    Comment: (NOTE) The Xpert Xpress SARS-CoV-2/FLU/RSV  plus assay is intended as an aid in the diagnosis of influenza from Nasopharyngeal swab specimens and should not be used as a sole basis for treatment. Nasal washings and aspirates are unacceptable for Xpert Xpress SARS-CoV-2/FLU/RSV testing.  Fact Sheet for Patients: EntrepreneurPulse.com.au  Fact Sheet for Healthcare Providers: IncredibleEmployment.be  This test is not yet approved or cleared by the [place] FDA and has been authorized for detection and/or diagnosis of SARS-CoV-2 by FDA under an Emergency Use Authorization (EUA). This EUA will remain in effect (meaning this test can be used) for the duration of the COVID-19 declaration under Section 564(b)(1) of the Act, 21 U.S.C. section 360bbb-3(b)(1), unless the authorization is terminated or revoked.  Performed at [facility], [place] [address] [place], [place] [address]   Culture, blood (routine x 2)     Status: None   Collection Time: 12/14/20  8:01 PM   Specimen: BLOOD RIGHT WRIST  Result Value Ref Range Status   Specimen Description BLOOD RIGHT WRIST  Final   Special Requests   Final    BOTTLES DRAWN AEROBIC AND ANAEROBIC Blood Culture results may not be optimal due to an inadequate volume of blood received in culture bottles   Culture   Final    NO GROWTH 5 DAYS Performed at [facility], [place] [address] [place], [place] [address]    Report Status 12/19/2020 FINAL  Final  Culture, blood (routine x 2)     Status: None   Collection Time: 12/14/20 10:27 PM   Specimen: BLOOD LEFT ARM  Result Value Ref Range Status   Specimen Description BLOOD LEFT ARM  Final   Special Requests   Final    AEROBIC BOTTLE ONLY Blood Culture results may not be optimal due to an inadequate volume of blood received in culture bottles   Culture   Final    NO GROWTH 5 DAYS Performed at [facility], [place] [address] [place], [place] [address]    Report Status 12/19/2020 FINAL  Final      Labs: Basic Metabolic Panel: Recent Labs  Lab 12/14/20 1248 12/15/20 0315 12/16/20 0434 12/17/20 0354 12/18/20 0224  NA 142 138 141 140 137  K 3.4* 3.2* 4.2 4.1 4.2  CL 107 103 107 103 102  CO2 25 26 27 29 26   GLUCOSE 152* 122* 156* 136* 189*  BUN 16 16 18 16 20   CREATININE 0.73 0.58 0.76 0.60 0.61  CALCIUM 9.8 9.4 9.8 10.0 10.1   Liver Function Tests: Recent Labs  Lab 12/14/20 1248  AST 16  ALT 11  ALKPHOS 44  BILITOT 0.5  PROT 6.3*  ALBUMIN 2.8*   No results for input(s): LIPASE, AMYLASE in the last 168 hours. No results for input(s): AMMONIA in the last 168 hours. CBC: Recent Labs  Lab 12/14/20 1248 12/15/20 0315 12/16/20 0434 12/17/20 0354 12/18/20 0224  WBC 6.6 6.8 6.5 7.6 7.2  NEUTROABS 4.0  --  3.3 3.5 3.6  HGB 11.0* 9.8* 9.6* 9.6* 9.5*  HCT 38.0 30.2* 31.2* 31.1* 31.2*  MCV 98.2 90.1 91.2 91.2 91.2  PLT 369 376 328 304 314   Cardiac Enzymes: No results for input(s): CKTOTAL, CKMB, CKMBINDEX, TROPONINI in the last 168 hours. BNP: BNP (last 3 results) No results for input(s): BNP in the last 8760 hours.  ProBNP (last 3 results) No results for input(s):  PROBNP in the last 8760 hours.  CBG: Recent Labs  Lab 12/19/20 0636 12/19/20 1110 12/19/20 1627 12/19/20 2111 12/20/20 0640  GLUCAP 142* 244* 183* 179* 157*       Signed:  Kayleen Memos, MD [facility] 12/20/2020, 1:18 PM

## 2020-12-20 NOTE — TOC Transition Note (Addendum)
Transition of Care (TOC) - CM/SW Discharge Note Marvetta Gibbons RN, BSN Transitions of Care Unit 4E- RN Case Manager See Treatment Team for direct phone # Cross Coverage for 72M   Patient Details  Name: Rebecca Pruitt MRN: [id number] Date of Birth: May 11, 1929  Transition of Care [facility]) CM/SW Contact:  Dawayne Patricia, RN Phone Number: 12/20/2020, 11:43 AM   Clinical Narrative:    Pt stable for transition home today, Pt to return home with family. Family has needed DME needs- no new DME needs noted. Orders have been placed to resume [facility] services as prior to admit.- RN/PT/OT/aide/SW- pt was active with [facility]. Msg sent to Amy with [facility] for resumption of [facility] services.  Family to transport pt home.   1430- spoke with [name]- to confirm transition plans/needs- per Rebecca Pruitt she has hospital bed and w/c at home- they had hoyer lift however returned it. Discussed getting one through [employer] however daughter decided on again purchasing one privately again. No other DME needs per daughter. Daughter waiting to confirm her spouse can come and assist with transport - will let staff know if transportation assistance is needed tomorrow.  Also confirmed [place] agency- [facility]- daughter asked about aide assistance- explained aide is a "bath" aide and will see if [facility] has aide available- daughter voices she will stay with [facility] even if aide is not available. Notified Amy with [facility] about aide need.   Referral also received about outpt PC need- discussed outpt PC services with daughter- choice offered for agency- daughter voiced she did not have a preference for agency- based on home address - referral made to [facility]. - Call made to [facility] with [place]- and referral accepted.    Final next level of care: [place] Barriers to Discharge: Barriers Resolved   Patient Goals and CMS Choice Patient states their goals for this  hospitalization and ongoing recovery are:: Return home with Home health CMS Medicare.gov Compare Post Acute Care list provided to:: Patient Represenative (must comment) Choice offered to / list presented to : Adult Children  Discharge Placement                 Home with [facility]      Discharge Plan and Services   Discharge Planning Services: CM Consult Post Acute Care Choice: Home Health          DME Arranged: N/A DME Agency: NA       HH Arranged: RN,PT,OT (resumption of care) [place] Agency: [facility] Date [place]: 12/18/20      Social Determinants of Health (SDOH) Interventions     Readmission Risk Interventions Readmission Risk Prevention Plan 12/20/2020  Transportation Screening Complete  PCP or Specialist Appt within 5-7 Days Complete  Home Care Screening Complete  Medication Review (RN CM) Complete  Some recent data might be hidden

## 2020-12-21 DIAGNOSIS — N390 Urinary tract infection, site not specified: Secondary | ICD-10-CM | POA: Diagnosis not present

## 2020-12-21 DIAGNOSIS — Z515 Encounter for palliative care: Secondary | ICD-10-CM

## 2020-12-21 DIAGNOSIS — G9341 Metabolic encephalopathy: Secondary | ICD-10-CM | POA: Diagnosis not present

## 2020-12-21 DIAGNOSIS — R627 Adult failure to thrive: Secondary | ICD-10-CM | POA: Diagnosis not present

## 2020-12-21 DIAGNOSIS — Q248 Other specified congenital malformations of heart: Secondary | ICD-10-CM | POA: Diagnosis not present

## 2020-12-21 DIAGNOSIS — Z7189 Other specified counseling: Secondary | ICD-10-CM | POA: Diagnosis not present

## 2020-12-21 LAB — BRAIN NATRIURETIC PEPTIDE: B Natriuretic Peptide: 477.2 pg/mL — ABNORMAL HIGH (ref 0.0–100.0)

## 2020-12-21 LAB — CBC WITH DIFFERENTIAL/PLATELET
Abs Immature Granulocytes: 0.02 10*3/uL (ref 0.00–0.07)
Basophils Absolute: 0.1 10*3/uL (ref 0.0–0.1)
Basophils Relative: 1 %
Eosinophils Absolute: 0.2 10*3/uL (ref 0.0–0.5)
Eosinophils Relative: 2 %
HCT: 32.4 % — ABNORMAL LOW (ref 36.0–46.0)
Hemoglobin: 10.2 g/dL — ABNORMAL LOW (ref 12.0–15.0)
Immature Granulocytes: 0 %
Lymphocytes Relative: 15 %
Lymphs Abs: 1.2 10*3/uL (ref 0.7–4.0)
MCH: 28.6 pg (ref 26.0–34.0)
MCHC: 31.5 g/dL (ref 30.0–36.0)
MCV: 90.8 fL (ref 80.0–100.0)
Monocytes Absolute: 0.9 10*3/uL (ref 0.1–1.0)
Monocytes Relative: 12 %
Neutro Abs: 5.7 10*3/uL (ref 1.7–7.7)
Neutrophils Relative %: 70 %
Platelets: 339 10*3/uL (ref 150–400)
RBC: 3.57 MIL/uL — ABNORMAL LOW (ref 3.87–5.11)
RDW: 14.6 % (ref 11.5–15.5)
WBC: 8 10*3/uL (ref 4.0–10.5)
nRBC: 0 % (ref 0.0–0.2)

## 2020-12-21 LAB — COMPREHENSIVE METABOLIC PANEL
ALT: 9 U/L (ref 0–44)
AST: 15 U/L (ref 15–41)
Albumin: 2.5 g/dL — ABNORMAL LOW (ref 3.5–5.0)
Alkaline Phosphatase: 52 U/L (ref 38–126)
Anion gap: 7 (ref 5–15)
BUN: 28 mg/dL — ABNORMAL HIGH (ref 8–23)
CO2: 29 mmol/L (ref 22–32)
Calcium: 10.1 mg/dL (ref 8.9–10.3)
Chloride: 106 mmol/L (ref 98–111)
Creatinine, Ser: 1.05 mg/dL — ABNORMAL HIGH (ref 0.44–1.00)
GFR, Estimated: 51 mL/min — ABNORMAL LOW (ref >60)
Glucose, Bld: 296 mg/dL — ABNORMAL HIGH (ref 70–99)
Potassium: 3.6 mmol/L (ref 3.5–5.1)
Sodium: 142 mmol/L (ref 135–145)
Total Bilirubin: 0.6 mg/dL (ref 0.3–1.2)
Total Protein: 5.8 g/dL — ABNORMAL LOW (ref 6.5–8.1)

## 2020-12-21 LAB — MAGNESIUM: Magnesium: 2 mg/dL (ref 1.7–2.4)

## 2020-12-21 LAB — PROCALCITONIN: Procalcitonin: <0.1 ng/mL

## 2020-12-21 LAB — GLUCOSE, CAPILLARY
Glucose-Capillary: 122 mg/dL — ABNORMAL HIGH (ref 70–99)
Glucose-Capillary: 122 mg/dL — ABNORMAL HIGH (ref 70–99)
Glucose-Capillary: 226 mg/dL — ABNORMAL HIGH (ref 70–99)
Glucose-Capillary: 187 mg/dL — ABNORMAL HIGH (ref 70–99)

## 2020-12-21 LAB — PHOSPHORUS: Phosphorus: 2.6 mg/dL (ref 2.5–4.6)

## 2020-12-21 MED ORDER — FUROSEMIDE 20 MG PO TABS: 20.0000 mg | ORAL_TABLET | Freq: Every day | ORAL | 0 refills | Status: AC | PRN

## 2020-12-21 NOTE — Care Management Important Message (Signed)
Important Message  Patient Details  Name: Rebecca Pruitt MRN: [id number] Date of Birth: 1929/03/03   Medicare Important Message Given:  Yes     [name] P [name] 12/21/2020, 2:15 PM

## 2020-12-21 NOTE — Progress Notes (Signed)
Progress Note  [name] [facility]:[id number] DOB: 1929-07-27  PCP: Vicenta Aly, FNP  Admit date: 12/14/2020 Discharge date: 12/21/2020  Time spent: 35 minutes.  Recommendations for Outpatient Follow-up:  1. Follow-up with cardiology in 1 to 2 weeks. 2. Follow-up with urology in 1 to 2 weeks. 3. Follow-up with palliative care medicine in 1 to 2 weeks. 4. Follow-up with your PCP in 1 to 2 weeks. 5. Continue PT OT with assistance and fall precautions. 6. Take your medications as prescribed.  Discharge Diagnoses:  Active Hospital Problems   Diagnosis Date Noted  . Acute metabolic encephalopathy 85/85/6314  . Atrial fibrillation, chronic ([facility]) 12/14/2020  . Anemia 12/14/2020  . Hypokalemia 12/14/2020  . History of CVA (cerebrovascular accident) 12/14/2020  . Abnormal cardiac valve 11/22/2020  . DM2 (diabetes mellitus, type 2) ([facility]) 11/22/2020    Resolved Hospital Problems  No resolved problems to display.    Discharge Condition: Stable.  Diet recommendation: Heart healthy carb modified diet.  Vitals:   12/21/20 0536 12/21/20 0914  BP: (!) 169/76 (!) 141/54  Pulse: 75 72  Resp: 16 19  Temp: 97.6 F (36.4 C) 98.2 F (36.8 C)  SpO2: 95% 95%    History of present illness:  Rebecca Makofskeis a 87 y.o.femalewith PMH significant for DM2, HTN, HLD, CVA, chronic A. fib, severe aortic stenosis, moderate pericardial effusion with no tamponade, moderate mitral regurgitation, chronic bladder outlet obstruction with large diverticulum status post stent placement on 11/26/20, recurrent UTI.  Patient was brought to the ED by family on 12/14/20 for altered mental status and generalized weakness.  2/3-2/11/22, patient was hospitalized for recurrent UTI and chronicbladder outlet obstruction with large diverticulum.  11/26/20, she underwent cystoscopy and stent placement by urology. 12/11/20, patient was at urologist office for stent removal but her urine looked infected and the  stent was not removed. She was sent home on empiric treatment with Augmentin. Urine culture came back positive with 50,000 colonies of ESBL E. Coli. 12/14/20, patient presented to ID clinic for follow-up for ESBL UTI, complaint of increased lethargy. ID Dr. Juleen China sent her to ED for admission and treatment with IV antibiotics.  She has completed 5 days of meropenem.  Significant events:  12/16/20, code stroke called for facial droop. CT scan head and MRI brain negative for acute ischemia/infarct.  Hospital course complicated by generalized weakness.  She was assessed by PT OT with recommendation for SNF.  Daughter preference is to bring home with home health services.  TOC consulted to assist with arrangement for PT OT, RN, home health aide, CSW.  Discussed case with cardiology, Dr. Harrell Gave, plan is to follow-up outpatient for possible TAVR if she is a candidate.  Discussed with urology, Dr. Milford Cage, urology will arrange for follow-up next week outpatient for possible stent removal.  12/21/20: Discharge has been delayed due to decline in overall clinical picture.  Generalized weakness, minimally interactive, unclear if she will be a candidate for any invasive procedures, TAVR.  Palliative care team consulted to assist with establishing goals of care.  Hospital Course:  Active Problems:   Abnormal cardiac valve   DM2 (diabetes mellitus, type 2) (HCC)   Acute metabolic encephalopathy   Atrial fibrillation, chronic (HCC)   Anemia   Hypokalemia   History of CVA (cerebrovascular accident)  Treated ESBL E. coli UTI, POA Seen by infectious disease, recommended 5 days of meropenem. Blood cultures obtained on 12/14/2020, negative final. Urine culture obtained on 12/14/2020, less than 10,000 colonies, insignificant growth, final. We will  repeat labs and follow results.  Newly diagnosed atypical atrial flutter/coarse atrial fibrillation CHA2DS2-VASc score 7 Reported prior history of A. fib She  is currently not on any rate control agents. She is on Eliquis for secondary CVA prevention. Stop aspirin and Plavix, per Dr. Leonie Man, neurologist recommendation. Continue Eliquis alone. Repeating CBC, monitor H&H. Transfuse hemoglobin less than 8.  Severe aortic stenosis/moderate pericardial effusion/moderate mitral regurgitation. Outpatient evaluation for TAVR per cardiology. Per cardiology needs to recover from current infection prior to evaluation to see if she is a TAVR Candidate.  Acute metabolic encephalopathy in the setting of dementia and prior history of stroke. At baseline the patient is legally blind, oriented to place, makes jokes, and can recognize family members. Reorient as needed Continue fall precautions Mobilize as tolerated with fall precautions  History of CVA She had a code stroke on 12/16/2020 in the hospital. Imaging did not reveal acute ischemia or infarct. Seen by neurology.  Dysphagia with history of prior stroke. Seen by speech therapist with recommendation for dysphagia 3 (mechanical soft); Thin liquids, provided via straw, cup.   Medication administration: Crushed with pure. Staff to assist with self-feeding. Full supervision, minimize environmental distractions, small sips/bites/slow rate. Seated upright 90 degrees, keep upright 30 to 60 minutes after meal. Continue aspiration precautions.  Type 2 diabetes with hyperglycemia Hemoglobin A1c 7.5 on 11/23/2020. Resume home regimen at discharge.  Chronic anxiety/depression Continue home Celexa Periodically assess QTC Last QTC 444 on 12/20/2020. Follow-up with your PCP  Dementia Continue home Aricept Follow-up with your PCP  Resolved post repletion: Hypokalemia Serum potassium 4.2 from 3.2. Repeat labs and follow results.  Physical debility/generalized weakness. Minimally interactive, weak appearing, frail. Seen by PT OT with recommendation for SNF. Daughter preference is to bring home with  home health services.  TOC consulted to assist with arrangement for PT OT, RN, home health aide, CSW. Continue fall precautions, on blood thinners.  Goals of care Patient and family would benefit from palliative care medicine follow-up outpatient. TOC consulted to assist with referral to palliative care team outpatient. Appreciate TOC's assistance. Palliative care team consult in-house to assist with establishing goals of care.  Code Status: Partial code, no CPR.  Family Communication:  Updated her daughter via phone on 12/21/2020.  Disposition Plan:  Will discharge to home with home health services on 12/22/2020.   Consultants:  Cardiology  Infectious disease.  Neurology.  Palliative care team.  Procedures:  None  Antimicrobials:  Meropenem, completed course.  DVT prophylaxis: Eliquis.   Discharge Exam: BP (!) 141/54 (BP Location: Right Arm)   Pulse 72   Temp 98.2 F (36.8 C)   Resp 19   Ht 5\' 4"  (1.626 m)   Wt 47.6 kg   SpO2 95%   BMI 18.01 kg/m  . General: 87 y.o. year-old female frail-appearing in no acute distress.  Minimally interactive. . Cardiovascular: Regular rate and rhythm no rubs or gallops.   Marland Kitchen Respiratory: Clear to auscultation no wheezes or rales.   . Abdomen: Soft nontender no bowel sounds present. . Musculoskeletal: No extremity edema bilaterally. Marland Kitchen Psychiatry: Mood is appropriate for condition setting.  Discharge Instructions You were cared for by a hospitalist during your hospital stay. If you have any questions about your discharge medications or the care you received while you were in the hospital after you are discharged, you can call the unit and asked to speak with the hospitalist on call if the hospitalist that took care of you is not available. Once  you are discharged, your primary care physician will handle any further medical issues. Please note that NO REFILLS for any discharge medications will be authorized once you are  discharged, as it is imperative that you return to your primary care physician (or establish a relationship with a primary care physician if you do not have one) for your aftercare needs so that they can reassess your need for medications and monitor your lab values.   Allergies as of 12/21/2020      Reactions   Codeine    Syncope       Medication List    STOP taking these medications   amoxicillin-clavulanate 875-125 MG tablet Commonly known as: AUGMENTIN   aspirin 81 MG tablet   clopidogrel 75 MG tablet Commonly known as: PLAVIX   LORazepam 0.5 MG tablet Commonly known as: ATIVAN     TAKE these medications   apixaban 2.5 MG Tabs tablet Commonly known as: ELIQUIS Take 1 tablet (2.5 mg total) by mouth 2 (two) times daily.   atorvastatin 40 MG tablet Commonly known as: LIPITOR Take 1 tablet (40 mg total) by mouth daily.   B-D ULTRAFINE III SHORT PEN 31G X 8 MM Misc Generic drug: Insulin Pen Needle See admin instructions.   citalopram 20 MG tablet Commonly known as: CELEXA Take 20 mg by mouth at bedtime.   Contour Next Test test strip Generic drug: glucose blood 1 each 3 (three) times daily.   CRANBERRY PO Take 4,200 mg by mouth 2 (two) times daily.   diphenhydrAMINE 25 MG tablet Commonly known as: BENADRYL Take 25 mg by mouth every 6 (six) hours as needed for sleep.   donepezil 10 MG tablet Commonly known as: ARICEPT Take 10 mg by mouth at bedtime.   fenofibrate 160 MG tablet Take 160 mg by mouth at bedtime.   furosemide 20 MG tablet Commonly known as: LASIX Take 1 tablet (20 mg total) by mouth daily as needed for fluid or edema (swelling, weight gain >2 lbs). What changed:   when to take this  reasons to take this   insulin NPH-regular Human (70-30) 100 UNIT/ML injection Inject 1-7 Units into the skin 2 (two) times daily as needed (high blood sugar). If blood sugar is over 150 takes 1 unit ; sliding scale   melatonin 3 MG Tabs tablet Take 1 tablet  (3 mg total) by mouth at bedtime as needed for up to 20 days.   OXYGEN Inhale 2 L into the lungs at bedtime.            Durable Medical Equipment  (From admission, onward)         Start     Ordered   12/20/20 1233  For home use only DME Hospital bed  Once       Question Answer Comment  Length of Need 12 Months   The above medical condition requires: Patient requires the ability to reposition frequently   Head must be elevated greater than: 30 degrees   Bed type Semi-electric   Hoyer Lift Yes      12/20/20 1234   12/20/20 1233  For home use only DME standard manual wheelchair with seat cushion  Once       Comments: Patient suffers from ambulatory dysfunction which impairs their ability to perform daily activities like walking in the home.  A walker will not resolve issue with performing activities of daily living. A wheelchair will allow patient to safely perform daily activities. Patient can safely  propel the wheelchair in the home or has a caregiver who can provide assistance. Length of need 12 months. Accessories: elevating leg rests, wheel locks, extensions and anti-tippers.   12/20/20 1234         Allergies  Allergen Reactions  . Codeine     Syncope     Follow-up Information    [facility] Follow up.   Specialty: [place] Why: HHRN/PT/OT/SW services to resume on discharge Contact information: [place] [address] [phone]        Vicenta Aly, [place]. Call in 1 day(s).   Specialty: Nurse Practitioner Why: Please call for a post hospital follow-up appointment. Contact information: [place] [address] [phone]        Buford Dresser, MD. Call in 1 day(s).   Specialty: Cardiology Why: Please call for a post hospital follow-up appointment. Contact information: [address] [place] [address] [phone]        Remi Haggard, MD. Call in 1  day(s).   Specialty: Urology Why: Please call for a post hospital follow-up appointment. Contact information: [place] [place] [address] [phone]        [facility] [place] Follow up.   Specialty: PALLIATIVE CARE Why: Outpt Palliative Care referral made- they will contact you post discharge for ongoing PC needs Contact information: [address] [place] [address] [phone]               The results of significant diagnostics from this hospitalization (including imaging, microbiology, ancillary and laboratory) are listed below for reference.    Significant Diagnostic Studies: CT HEAD WO CONTRAST  Result Date: 11/22/2020 CLINICAL DATA:  Focal neuro deficit greater than 6 hours. EXAM: CT HEAD WITHOUT CONTRAST TECHNIQUE: Contiguous axial images were obtained from the base of the skull through the vertex without intravenous contrast. COMPARISON:  CT head 05/25/2008 FINDINGS: Brain: Generalized atrophy. Extensive white matter hypodensity bilaterally, chronic. Negative for acute infarct, hemorrhage, mass. Vascular: Negative for hyperdense vessel Skull: Negative Sinuses/Orbits: Paranasal sinuses clear. Right mastoidectomy. Left mastoid clear. Bilateral cataract extraction. Other: None IMPRESSION: Atrophy and chronic white matter changes.  No acute abnormality. Electronically Signed   By: Franchot Gallo M.D.   On: 11/22/2020 17:40   MR ANGIO HEAD WO CONTRAST  Result Date: 12/16/2020 CLINICAL DATA:  Neuro deficit, acute, stroke suspected EXAM: MRI HEAD WITHOUT CONTRAST MRA HEAD WITHOUT CONTRAST TECHNIQUE: Multiplanar, multiecho pulse sequences of the brain and surrounding structures were obtained without intravenous contrast. Angiographic images of the head were obtained using MRA technique without contrast. COMPARISON:  12/16/2020 and prior. FINDINGS: MRI HEAD FINDINGS Brain: No diffusion-weighted signal abnormality. No intracranial  hemorrhage. No midline shift, ventriculomegaly or extra-axial fluid collection. No mass lesion. Moderate cerebral atrophy with ex vacuo dilatation. Chronic left pontine and right thalamic and bilateral corona radiata/centrum semiovale lacunar insults. Advanced chronic microvascular ischemic changes. Vascular: Please see MRA. Skull and upper cervical spine: Normal marrow signal. Sinuses/Orbits: Sequela of bilateral lens replacement. Clear paranasal sinuses. Pneumatized mastoid air cells. Other: None. MRA HEAD FINDINGS Anterior circulation: Patent ICAs and ophthalmic artery origins. Patent ACAs, trifurcated. Patent MCAs. Mild bilateral M2 segment narrowing. Posterior circulation: Dominant left vertebral artery supplies the basilar. Patent left PICA. Right vertebral terminates as PICA. Diminutive basilar artery, patent. Patent superior cerebellar arteries with mild proximal narrowing on the left. Fetal origin of the bilateral PCAs. Mild left P3 segment  narrowing. Anatomic variants: Please see above. IMPRESSION: MRI head: No acute intracranial process. Multifocal lacunar insults as detailed above. Moderate cerebral atrophy and advanced chronic microvascular ischemic changes. MRA head: No emergent vascular finding within the head. Mild bilateral M2, left superior cerebellar artery and left P3 segment narrowing. Electronically Signed   By: Primitivo Gauze M.D.   On: 12/16/2020 18:56   MR BRAIN WO CONTRAST  Result Date: 12/16/2020 CLINICAL DATA:  Neuro deficit, acute, stroke suspected EXAM: MRI HEAD WITHOUT CONTRAST MRA HEAD WITHOUT CONTRAST TECHNIQUE: Multiplanar, multiecho pulse sequences of the brain and surrounding structures were obtained without intravenous contrast. Angiographic images of the head were obtained using MRA technique without contrast. COMPARISON:  12/16/2020 and prior. FINDINGS: MRI HEAD FINDINGS Brain: No diffusion-weighted signal abnormality. No intracranial hemorrhage. No midline shift,  ventriculomegaly or extra-axial fluid collection. No mass lesion. Moderate cerebral atrophy with ex vacuo dilatation. Chronic left pontine and right thalamic and bilateral corona radiata/centrum semiovale lacunar insults. Advanced chronic microvascular ischemic changes. Vascular: Please see MRA. Skull and upper cervical spine: Normal marrow signal. Sinuses/Orbits: Sequela of bilateral lens replacement. Clear paranasal sinuses. Pneumatized mastoid air cells. Other: None. MRA HEAD FINDINGS Anterior circulation: Patent ICAs and ophthalmic artery origins. Patent ACAs, trifurcated. Patent MCAs. Mild bilateral M2 segment narrowing. Posterior circulation: Dominant left vertebral artery supplies the basilar. Patent left PICA. Right vertebral terminates as PICA. Diminutive basilar artery, patent. Patent superior cerebellar arteries with mild proximal narrowing on the left. Fetal origin of the bilateral PCAs. Mild left P3 segment narrowing. Anatomic variants: Please see above. IMPRESSION: MRI head: No acute intracranial process. Multifocal lacunar insults as detailed above. Moderate cerebral atrophy and advanced chronic microvascular ischemic changes. MRA head: No emergent vascular finding within the head. Mild bilateral M2, left superior cerebellar artery and left P3 segment narrowing. Electronically Signed   By: Primitivo Gauze M.D.   On: 12/16/2020 18:56   DG Chest Portable 1 View  Result Date: 11/22/2020 CLINICAL DATA:  Weakness. EXAM: PORTABLE CHEST 1 VIEW COMPARISON:  05/25/2008 FINDINGS: The heart size is mildly enlarged. Aortic calcifications are noted. There is no pneumothorax. No large pleural effusion. No acute osseous abnormality. IMPRESSION: 1. No acute cardiopulmonary disease. 2. Mild cardiomegaly. 3. Aortic calcifications. Electronically Signed   By: Constance Holster M.D.   On: 11/22/2020 16:52   DG C-Arm 1-60 Min-No Report  Result Date: 11/26/2020 Fluoroscopy was utilized by the requesting  physician.  No radiographic interpretation.   ECHOCARDIOGRAM COMPLETE  Result Date: 11/23/2020    ECHOCARDIOGRAM REPORT   Patient Name:   Elora Boberg Date of Exam: 11/23/2020 Medical Rec #:  [id number]         Height:       64.0 in Accession #:    [id number]        Weight:       132.0 lb Date of Birth:  [birth year]         BSA:          1.640 m Patient Age:    52 years          BP:           142/66 mmHg Patient Gender: F                 HR:           50 bpm. Exam Location:  Inpatient Procedure: 2D Echo, 3D Echo, Cardiac Doppler and Color Doppler Indications:    I35.0 Nonrheumatic aortic (valve) stenosis  History:  Patient has prior history of Echocardiogram examinations, most                 recent 05/26/2008. CAD, Aortic Valve Disease and Mitral Valve                 Disease, Signs/Symptoms:Altered Mental Status and Murmur; Risk                 Factors:Hypertension and Diabetes. Cancer. Dementia.  Sonographer:    Roseanna Rainbow RDCS Referring Phys: [place]  Sonographer Comments: Patient could not follow directions. IMPRESSIONS  1. Left ventricular ejection fraction, by estimation, is 65 to 70%. The left ventricle has normal function. The left ventricle has no regional wall motion abnormalities. There is severe left ventricular hypertrophy. Left ventricular diastolic parameters  are consistent with Grade II diastolic dysfunction (pseudonormalization).  2. Right ventricular systolic function is normal. The right ventricular size is normal. There is normal pulmonary artery systolic pressure.  3. Left atrial size was severely dilated.  4. Pericardial effusion - 1.1 cm. Moderate pericardial effusion. The pericardial effusion is lateral to the left ventricle. There is no evidence of cardiac tamponade.  5. The mitral valve is normal in structure. Moderate mitral valve regurgitation. No evidence of mitral stenosis. The mean mitral valve gradient is 4.0 mmHg. Severe mitral annular calcification.  6. The  aortic valve is tricuspid. Aortic valve regurgitation is not visualized. Severe aortic valve stenosis. Aortic valve area, by VTI measures 0.84 cm. Aortic valve mean gradient measures 38.3 mmHg. Aortic valve Vmax measures 3.97 m/s.  7. The inferior vena cava is normal in size with greater than 50% respiratory variability, suggesting right atrial pressure of 3 mmHg. FINDINGS  Left Ventricle: Left ventricular ejection fraction, by estimation, is 65 to 70%. The left ventricle has normal function. The left ventricle has no regional wall motion abnormalities. The left ventricular internal cavity size was normal in size. There is  severe left ventricular hypertrophy. Left ventricular diastolic parameters are consistent with Grade II diastolic dysfunction (pseudonormalization). Right Ventricle: The right ventricular size is normal. No increase in right ventricular wall thickness. Right ventricular systolic function is normal. There is normal pulmonary artery systolic pressure. The tricuspid regurgitant velocity is 2.41 m/s, and  with an assumed right atrial pressure of 3 mmHg, the estimated right ventricular systolic pressure is 25.9 mmHg. Left Atrium: Left atrial size was severely dilated. Right Atrium: Right atrial size was normal in size. Pericardium: Pericardial effusion - 1.1 cm. A moderately sized pericardial effusion is present. The pericardial effusion is lateral to the left ventricle. There is no evidence of cardiac tamponade. Mitral Valve: The mitral valve is normal in structure. There is moderate thickening of the mitral valve leaflet(s). There is moderate calcification of the mitral valve leaflet(s). Severe mitral annular calcification. Moderate mitral valve regurgitation. No evidence of mitral valve stenosis. MV peak gradient, 11.8 mmHg. The mean mitral valve gradient is 4.0 mmHg. Tricuspid Valve: The tricuspid valve is normal in structure. Tricuspid valve regurgitation is not demonstrated. No evidence of  tricuspid stenosis. Aortic Valve: The aortic valve is tricuspid. Aortic valve regurgitation is not visualized. Severe aortic stenosis is present. Aortic valve mean gradient measures 38.3 mmHg. Aortic valve peak gradient measures 63.1 mmHg. Aortic valve area, by VTI measures  0.84 cm. Pulmonic Valve: The pulmonic valve was normal in structure. Pulmonic valve regurgitation is trivial. No evidence of pulmonic stenosis. Aorta: The aortic root is normal in size and structure. Venous: The inferior vena  cava is normal in size with greater than 50% respiratory variability, suggesting right atrial pressure of 3 mmHg. IAS/Shunts: No atrial level shunt detected by color flow Doppler.  LEFT VENTRICLE PLAX 2D LVIDd:         2.50 cm     Diastology LVIDs:         1.70 cm     LV e' medial:    3.00 cm/s LV PW:         2.20 cm     LV E/e' medial:  46.3 LV IVS:        1.90 cm     LV e' lateral:   3.50 cm/s LVOT diam:     2.00 cm     LV E/e' lateral: 39.7 LV SV:         94 LV SV Index:   57 LVOT Area:     3.14 cm  LV Volumes (MOD) LV vol d, MOD A2C: 50.7 ml LV vol d, MOD A4C: 58.5 ml LV vol s, MOD A2C: 19.4 ml LV vol s, MOD A4C: 22.0 ml LV SV MOD A2C:     31.3 ml LV SV MOD A4C:     58.5 ml LV SV MOD BP:      38.6 ml RIGHT VENTRICLE            IVC RV S prime:     8.18 cm/s  IVC diam: 1.60 cm TAPSE (M-mode): 1.8 cm LEFT ATRIUM             Index       RIGHT ATRIUM          Index LA diam:        4.30 cm 2.62 cm/m  RA Area:     9.23 cm LA Vol (A2C):   72.6 ml 44.28 ml/m RA Volume:   15.40 ml 9.39 ml/m LA Vol (A4C):   78.5 ml 47.88 ml/m LA Biplane Vol: 74.5 ml 45.44 ml/m  AORTIC VALVE                    PULMONIC VALVE AV Area (Vmax):    1.21 cm     PR End Diast Vel: 2.23 msec AV Area (Vmean):   0.87 cm AV Area (VTI):     0.84 cm AV Vmax:           397.33 cm/s AV Vmean:          291.000 cm/s AV VTI:            1.117 m AV Peak Grad:      63.1 mmHg AV Mean Grad:      38.3 mmHg LVOT Vmax:         153.00 cm/s LVOT Vmean:        80.700  cm/s LVOT VTI:          0.299 m LVOT/AV VTI ratio: 0.27  AORTA Ao Root diam: 3.00 cm Ao Asc diam:  3.10 cm MITRAL VALVE                 TRICUSPID VALVE MV Area (PHT): 1.50 cm      TR Peak grad:   23.2 mmHg MV Area VTI:   1.35 cm      TR Vmax:        241.00 cm/s MV Peak grad:  11.8 mmHg MV Mean grad:  4.0 mmHg      SHUNTS MV Vmax:       1.72  m/s      Systemic VTI:  0.30 m MV Vmean:      91.5 cm/s     Systemic Diam: 2.00 cm MV Decel Time: 507 msec MR Peak grad:    134.6 mmHg MR Mean grad:    82.0 mmHg MR Vmax:         580.00 cm/s MR Vmean:        417.0 cm/s MR PISA:         4.02 cm MR PISA Eff ROA: 26 mm MR PISA Radius:  0.80 cm MV E velocity: 139.00 cm/s MV A velocity: 108.00 cm/s MV E/A ratio:  1.29 Candee Furbish MD Electronically signed by Candee Furbish MD Signature Date/Time: 11/23/2020/11:24:30 AM    Final    CT HEAD CODE STROKE WO CONTRAST  Result Date: 12/16/2020 CLINICAL DATA:  Code stroke.  Neuro deficit, acute, stroke suspected EXAM: CT HEAD WITHOUT CONTRAST TECHNIQUE: Contiguous axial images were obtained from the base of the skull through the vertex without intravenous contrast. COMPARISON:  11/22/2020 and prior. FINDINGS: Brain: No acute infarct or intracranial hemorrhage. No mass lesion. No midline shift, ventriculomegaly or extra-axial fluid collection. Diffuse parenchymal volume loss with ex vacuo dilatation. Chronic microvascular ischemic changes. Vascular: No hyperdense vessel or unexpected calcification. Bilateral skull base atherosclerotic calcifications. Skull: No acute finding.  Sequela of right mastoidectomy. Sinuses/Orbits: Normal orbits. Clear paranasal sinuses. No mastoid effusion. Other: None. ASPECTS [facility] Stroke Program Early CT Score) - Ganglionic level infarction (caudate, lentiform nuclei, internal capsule, insula, M1-M3 cortex): 7 - Supraganglionic infarction (M4-M6 cortex): 3 Total score (0-10 with 10 being normal): 10 IMPRESSION: 1. No acute intracranial process. Advanced  chronic microvascular ischemic changes. 2. ASPECTS is 10 3. Code stroke imaging results were communicated on 12/16/2020 at 5:28 pm to provider Dr. Rory Percy via secure text paging. Electronically Signed   By: Primitivo Gauze M.D.   On: 12/16/2020 17:29    Microbiology: Recent Results (from the past 240 hour(s))  Urine culture     Status: Abnormal   Collection Time: 12/14/20  7:49 PM   Specimen: Urine, Random  Result Value Ref Range Status   Specimen Description URINE, RANDOM  Final   Special Requests NONE  Final   Culture (A)  Final    <10,000 COLONIES/mL INSIGNIFICANT GROWTH Performed at [facility], [address] [place], [place] [address]    Report Status 12/16/2020 FINAL  Final  Resp Panel by RT-PCR (Flu A&B, Covid) Nasopharyngeal Swab     Status: None   Collection Time: 12/14/20  8:01 PM   Specimen: Nasopharyngeal Swab; Nasopharyngeal(NP) swabs in vial transport medium  Result Value Ref Range Status   SARS Coronavirus 2 by RT PCR NEGATIVE NEGATIVE Final    Comment: (NOTE) SARS-CoV-2 target nucleic acids are NOT DETECTED.  The SARS-CoV-2 RNA is generally detectable in upper respiratory specimens during the acute phase of infection. The lowest concentration of SARS-CoV-2 viral copies this assay can detect is 138 copies/mL. A negative result does not preclude SARS-Cov-2 infection and should not be used as the sole basis for treatment or other patient management decisions. A negative result may occur with  improper specimen collection/handling, submission of specimen other than nasopharyngeal swab, presence of viral mutation(s) within the areas targeted by this assay, and inadequate number of viral copies(<138 copies/mL). A negative result must be combined with clinical observations, patient history, and epidemiological information. The expected result is Negative.  Fact Sheet for Patients:  EntrepreneurPulse.com.au  Fact Sheet for  Healthcare  Providers:  IncredibleEmployment.be  This test is no t yet approved or cleared by the [name] and  has been authorized for detection and/or diagnosis of SARS-CoV-2 by FDA under an Emergency Use Authorization (EUA). This EUA will remain  in effect (meaning this test can be used) for the duration of the COVID-19 declaration under Section 564(b)(1) of the Act, 21 U.S.C.section 360bbb-3(b)(1), unless the authorization is terminated  or revoked sooner.       Influenza A by PCR NEGATIVE NEGATIVE Final   Influenza B by PCR NEGATIVE NEGATIVE Final    Comment: (NOTE) The Xpert Xpress SARS-CoV-2/FLU/RSV plus assay is intended as an aid in the diagnosis of influenza from Nasopharyngeal swab specimens and should not be used as a sole basis for treatment. Nasal washings and aspirates are unacceptable for Xpert Xpress SARS-CoV-2/FLU/RSV testing.  Fact Sheet for Patients: EntrepreneurPulse.com.au  Fact Sheet for Healthcare Providers: IncredibleEmployment.be  This test is not yet approved or cleared by the [place] FDA and has been authorized for detection and/or diagnosis of SARS-CoV-2 by FDA under an Emergency Use Authorization (EUA). This EUA will remain in effect (meaning this test can be used) for the duration of the COVID-19 declaration under Section 564(b)(1) of the Act, 21 U.S.C. section 360bbb-3(b)(1), unless the authorization is terminated or revoked.  Performed at [facility], [place] [address], [place], [place] [address]   Culture, blood (routine x 2)     Status: None   Collection Time: 12/14/20  8:01 PM   Specimen: BLOOD RIGHT WRIST  Result Value Ref Range Status   Specimen Description BLOOD RIGHT WRIST  Final   Special Requests   Final    BOTTLES DRAWN AEROBIC AND ANAEROBIC Blood Culture results may not be optimal due to an inadequate volume of blood received in culture bottles   Culture   Final    NO  GROWTH 5 DAYS Performed at [facility], [place] [address], [place], [place] [address]    Report Status 12/19/2020 FINAL  Final  Culture, blood (routine x 2)     Status: None   Collection Time: 12/14/20 10:27 PM   Specimen: BLOOD LEFT ARM  Result Value Ref Range Status   Specimen Description BLOOD LEFT ARM  Final   Special Requests   Final    AEROBIC BOTTLE ONLY Blood Culture results may not be optimal due to an inadequate volume of blood received in culture bottles   Culture   Final    NO GROWTH 5 DAYS Performed at [facility], [place] [address] [place], [place] [address]    Report Status 12/19/2020 FINAL  Final     Labs: Basic Metabolic Panel: Recent Labs  Lab 12/15/20 0315 12/16/20 0434 12/17/20 0354 12/18/20 0224  NA 138 141 140 137  K 3.2* 4.2 4.1 4.2  CL 103 107 103 102  CO2 26 27 29 26   GLUCOSE 122* 156* 136* 189*  BUN 16 18 16 20   CREATININE 0.58 0.76 0.60 0.61  CALCIUM 9.4 9.8 10.0 10.1   Liver Function Tests: No results for input(s): AST, ALT, ALKPHOS, BILITOT, PROT, ALBUMIN in the last 168 hours. No results for input(s): LIPASE, AMYLASE in the last 168 hours. No results for input(s): AMMONIA in the last 168 hours. CBC: Recent Labs  Lab 12/15/20 0315 12/16/20 0434 12/17/20 0354 12/18/20 0224  WBC 6.8 6.5 7.6 7.2  NEUTROABS  --  3.3 3.5 3.6  HGB 9.8* 9.6* 9.6* 9.5*  HCT 30.2* 31.2* 31.1* 31.2*  MCV 90.1 91.2 91.2 91.2  PLT 376 328 304 314   Cardiac Enzymes: No results for input(s): CKTOTAL, CKMB, CKMBINDEX, TROPONINI in the last 168 hours. BNP: BNP (last 3 results) No results for input(s): BNP in the last 8760 hours.  ProBNP (last 3 results) No results for input(s): PROBNP in the last 8760 hours.  CBG: Recent Labs  Lab 12/20/20 1309 12/20/20 1651 12/20/20 2102 12/21/20 0531 12/21/20 1054  GLUCAP 144* 213* 120* 122* 122*       Signed:  Kayleen Memos, MD [facility] 12/21/2020, 12:53 PM

## 2020-12-21 NOTE — Consult Note (Signed)
Consultation Note Date: 12/21/2020   Patient Name: [name]  DOB: 08-27-1929  MRN: [id number]  Age / Sex: 87 y.o., female  PCP: Vicenta Aly, [place] Referring Physician: Kayleen Memos, DO  Reason for Consultation: Establishing goals of care  HPI/Patient Profile: 87 y.o. female  with past medical history of DM2, HTN, HLD, CVA, chronic A. fib, severe aortic stenosis, moderate pericardial effusion with no tamponade, moderate mitral regurgitation, chronic bladder outlet obstruction with large diverticulum status post stent placement on 11/26/20, and recurrent UTI admitted on 12/14/2020 with AMS and weakness. On 12/14/20, patient presented to ID clinic for follow-up for ESBL UTI and complaint of increased lethargy. ID Dr. Juleen China sent her to ED for admission and treatment with IV antibiotics.  She has completed 5 days of meropenem. On 2/27 code stroke was called for facial droop. CT scan head and MRI brain negative for acute ischemia/infarct. Throughout hospitalization, weakness and lethargy have continued. PMT consulted to discuss [place].   Clinical Assessment and Goals of Care: I have reviewed medical records including EPIC notes, labs and imaging, assessed the patient and then met with patient and daughter [name] to discuss diagnosis prognosis, [place], EOL wishes, disposition and options.  I introduced Palliative Medicine as specialized medical care for people living with serious illness. It focuses on providing relief from the symptoms and stress of a serious illness. The goal is to improve quality of life for both the patient and the family.  [name] shows me a video of the patient ~1 month ago - she is awake, talking, smiling. [name] tells me current picture is very far from patient's baseline. Tells me patient was walking recently.    We discussed patient's current illness and what it means in the larger context of patient's on-going  co-morbidities.  Natural disease trajectory and expectations at EOL were discussed. We discuss her recurrent UTIs and heart conditions: severe aortic stenosis, a fib/flutter.   I attempted to elicit values and goals of care important to the patient.  [name] tells me the most important goal is for patient to be at home - she is not interested in any sort of facility placement.   [name] is very concerned that patient has not improved the way she has in the past once IV antibiotics are started for infection. She fears something else may be going on with patient that has not been found yet. She asks about other infections or stroke that was not seen on recent imaging. I spoke with Dr. Nevada Crane who is aware of these concerns - additional lab work has been ordered and daughter would be interested in speaking to neurology again.   We discussed the possibility that culmination of frequent infections and chronic conditions has been very hard on her already frail body and this may be a difficult recovery.   Discussed with daughter the importance of continued conversation with family and the medical providers regarding overall plan of care and treatment options, ensuring decisions are within the context of the patient's values and GOCs.    Discussed with daughter plan to follow up tomorrow for ongoing [place] discussion - she is agreeable.   Questions and concerns were addressed. The family was encouraged to call with questions or concerns.   Primary Decision Maker NEXT OF KIN - daughter [name]    SUMMARY OF RECOMMENDATIONS   - daughter would interested in ongoing work up, would like to see neurology again - Dr Nevada Crane aware of requests.   - daughter is agreeable  to ongoing [place] discussion - most important goal to her is patient at home (no facility) - will f/u 3/5   Code Status/Advance Care Planning:  Limited code - no CPR - will discuss further  Prognosis:   Unable to determine  Discharge  Planning: To Be Determined      Primary Diagnoses: Present on Admission: . Acute metabolic encephalopathy   I have reviewed the medical record, interviewed the patient and family, and examined the patient. The following aspects are pertinent.  Past Medical History:  Diagnosis Date  . Cancer ([place])    skin  . Coronary artery disease   . Diabetes mellitus   . Diabetic retinopathy   . Dyslipidemia   . Family history of adverse reaction to anesthesia    pts daughter vomits and has very difficult time to awaken  . H/O: CVA (cardiovascular accident)   . Hearing loss   . Heart murmur   . History of kidney stones   . Hypertension   . Stroke [facility]) 03/2020   x5   Social History   Socioeconomic History  . Marital status: Widowed    Spouse name: Not on file  . Number of children: 1  . Years of education: Not on file  . Highest education level: Not on file  Occupational History  . Occupation: retired  Tobacco Use  . Smoking status: Never Smoker  . Smokeless tobacco: Never Used  Vaping Use  . Vaping Use: Never used  Substance and Sexual Activity  . Alcohol use: No  . Drug use: Never  . Sexual activity: Not on file  Other Topics Concern  . Not on file  Social History Narrative  . Not on file   Social Determinants of Health   Financial Resource Strain: Not on file  Food Insecurity: Not on file  Transportation Needs: Not on file  Physical Activity: Not on file  Stress: Not on file  Social Connections: Not on file   Family History  Problem Relation Age of Onset  . Prostate cancer Other    Scheduled Meds: . apixaban  2.5 mg Oral BID  . atorvastatin  40 mg Oral Daily  . citalopram  20 mg Oral QHS  . donepezil  10 mg Oral QHS  . fenofibrate  160 mg Oral QHS  . insulin aspart  0-5 Units Subcutaneous QHS  . insulin aspart  0-9 Units Subcutaneous TID WC  . insulin glargine  5 Units Subcutaneous Daily   Continuous Infusions: PRN Meds:.melatonin Allergies  Allergen  Reactions  . Codeine     Syncope    Review of Systems  Unable to perform ROS: Mental status change    Physical Exam Constitutional:      General: She is not in acute distress.    Appearance: She is ill-appearing.     Comments: Opens eyes occasionally, does not make eye contact. Appears very weak and frail, unable to hold head up  Pulmonary:     Effort: Pulmonary effort is normal.  Skin:    Coloration: Skin is pale.     Vital Signs: BP (!) 141/54 (BP Location: Right Arm)   Pulse 72   Temp 98.2 F (36.8 C)   Resp 19   Ht _0  (1.626 m)   Wt 47.6 kg   SpO2 95%   BMI 18.01 kg/m  Pain Scale: PAINAD   Pain Score: 0-No pain   SpO2: SpO2: 95 % O2 Device:SpO2: 95 % O2 Flow Rate: .   IO:  Intake/output summary:   Intake/Output Summary (Last 24 hours) at 12/21/2020 1723 Last data filed at 12/21/2020 0900 Gross per 24 hour  Intake 340 ml  Output -  Net 340 ml    LBM: Last BM Date: 12/20/20 Baseline Weight: Weight: 47.6 kg Most recent weight: Weight: 47.6 kg     Palliative Assessment/Data: PPS 30%    Time Total: 60 minutes Greater than 50%  of this time was spent counseling and coordinating care related to the above assessment and plan.  Juel Burrow, DNP, AGNP-C Palliative Medicine Team [phone] Pager: [phone]

## 2020-12-21 NOTE — Progress Notes (Signed)
    Speech Language Pathology Treatment: Dysphagia  Patient Details Name: Rebecca Pruitt MRN: [id number] DOB: Oct 12, 1929 Today's Date: 12/21/2020 Time: 7116-5790 SLP Time Calculation (min) (ACUTE ONLY): 15 min  Assessment / Plan / Recommendation Clinical Impression  Pt was seen for dysphagia treatment and was cooperative throughout the session. Verbal output was minimal, but she responded to many questions. Nursing, and NT reported that the pt has been tolerating the current diet without overt s/sx of aspiration. Pt consumed solids from breakfast tray and regular texture solids without overt s/sx of aspiration. Mastication was mildly prolonged with regular textures and mild lingual residue was cleared with a liquid wash. Pt demonstrated delayed coughing following five consecutive swallows of thin liquids via straw. Pt's diet will be advanced further to dysphagia 3 solids and SLP will continue to follow pt.    HPI HPI: Rebecca Pruitt is a 87 y.o. female with medical history significant for dementia, recurrent UTI, chronic bladder outlet obstruction with large diverticulum s/p stent placement on 2/7,chronic atrial fibrillation , severe aortic stenosis/moderate mitral regurgitation, HTN, CVA, DM, HLD who was brought in by family for altered mental status. MRI negative; CXR 2/2 negative. MBS 2017: Mild oral phase dysphagia;Mild pharyngeal phase dysphagia   Clinical Impression Patient presents with a mild oropharyngeal dysphagia characterized by mild facial weakness and intermittently delayed swallow initiation resulting in flash penetration of both nectar thick and thin liquids. No frank penetration or aspiration noted.      SLP Plan  Continue with current plan of care       Recommendations  Diet recommendations: Dysphagia 3 (mechanical soft);Thin liquid Liquids provided via: Straw;Cup Medication Administration: Crushed with puree Supervision: Staff to assist with self feeding;Full  supervision/cueing for compensatory strategies Compensations: Minimize environmental distractions;Small sips/bites;Slow rate Postural Changes and/or Swallow Maneuvers: Seated upright 90 degrees;Upright 30-60 min after meal                Oral Care Recommendations: Oral care BID;Staff/trained caregiver to provide oral care Follow up Recommendations: 24 hour supervision/assistance;Skilled Nursing facility SLP Visit Diagnosis: Dysphagia, unspecified (R13.10) Plan: Continue with current plan of care       Rebecca Pruitt I. Rebecca Pruitt, Rebecca Pruitt, Rebecca Pruitt Office number [phone] Pager [phone]                Rebecca Pruitt 12/21/2020, 10:44 AM

## 2020-12-22 DIAGNOSIS — N390 Urinary tract infection, site not specified: Secondary | ICD-10-CM | POA: Diagnosis not present

## 2020-12-22 DIAGNOSIS — R627 Adult failure to thrive: Secondary | ICD-10-CM | POA: Diagnosis not present

## 2020-12-22 DIAGNOSIS — G9341 Metabolic encephalopathy: Secondary | ICD-10-CM | POA: Diagnosis not present

## 2020-12-22 DIAGNOSIS — Z7189 Other specified counseling: Secondary | ICD-10-CM | POA: Diagnosis not present

## 2020-12-22 LAB — GLUCOSE, CAPILLARY
Glucose-Capillary: 111 mg/dL — ABNORMAL HIGH (ref 70–99)
Glucose-Capillary: 151 mg/dL — ABNORMAL HIGH (ref 70–99)
Glucose-Capillary: 160 mg/dL — ABNORMAL HIGH (ref 70–99)
Glucose-Capillary: 158 mg/dL — ABNORMAL HIGH (ref 70–99)

## 2020-12-22 LAB — AMMONIA: Ammonia: 37 umol/L — ABNORMAL HIGH (ref 9–35)

## 2020-12-22 MED ORDER — LACTULOSE 10 GM/15ML PO SOLN
20.0000 g | Freq: Every day | ORAL | Status: DC
  Administered 2020-12-22 – 2020-12-23 (×2): 20 g via ORAL
  Filled 2020-12-22 (×2): qty 30

## 2020-12-22 NOTE — Progress Notes (Signed)
Daily Progress Note   Patient Name: Rebecca Pruitt       Date: 12/22/2020 DOB: 1929/03/16  Age: 87 y.o. MRN#: [id number] Attending Physician: Nita Sells, MD Primary Care Physician: Vicenta Aly, FNP Admit Date: 12/14/2020  Reason for Consultation/Follow-up: Establishing goals of care  Subjective: A bit more interactive than yesterday, falls asleep quickly. Tells me she feels "fine"  Length of Stay: 7  Current Medications: Scheduled Meds:  . apixaban  2.5 mg Oral BID  . atorvastatin  40 mg Oral Daily  . donepezil  10 mg Oral QHS  . fenofibrate  160 mg Oral QHS  . insulin aspart  0-5 Units Subcutaneous QHS  . insulin aspart  0-9 Units Subcutaneous TID WC  . insulin glargine  5 Units Subcutaneous Daily  . lactulose  20 g Oral Daily    Continuous Infusions:   PRN Meds:   Physical Exam Constitutional:      General: She is not in acute distress.    Appearance: She is ill-appearing.     Comments: lethargic  Pulmonary:     Effort: Pulmonary effort is normal.  Skin:    General: Skin is warm and dry.  Neurological:     Mental Status: She is disoriented.             Vital Signs: BP 134/72 (BP Location: Left Arm)   Pulse 71   Temp 97.8 F (36.6 C) (Oral)   Resp 20   Ht 5\' 4"  (1.626 m)   Wt 47.6 kg   SpO2 100%   BMI 18.01 kg/m  SpO2: SpO2: 100 % O2 Device: O2 Device: Room Air O2 Flow Rate:    Intake/output summary:   Intake/Output Summary (Last 24 hours) at 12/22/2020 1632 Last data filed at 12/22/2020 1232 Gross per 24 hour  Intake 840 ml  Output 1025 ml  Net -185 ml   LBM: Last BM Date: 12/21/20 Baseline Weight: Weight: 47.6 kg Most recent weight: Weight: 47.6 kg       Palliative Assessment/Data: PPS 30%    Flowsheet Rows   Flowsheet Row Most Recent  Value  Intake Tab   Referral Department Hospitalist  Unit at Time of Referral Cardiac/Telemetry Unit  Palliative Care Primary Diagnosis Sepsis/Infectious Disease  Date Notified 12/21/20  Palliative Care Type Rebecca Palliative care  Reason for referral Clarify Goals of Care  Date of Admission 12/14/20  Date first seen by Palliative Care 12/21/20  # of days Palliative referral response time 0 Day(s)  # of days IP prior to Palliative referral 7  Clinical Assessment   Palliative Performance Scale Score 30%  Psychosocial & Spiritual Assessment   Palliative Care Outcomes   Patient/Family meeting held? Yes  Who was at the meeting? daughter  [place] goals of care      Patient Active Problem List   Diagnosis Date Noted  . Acute metabolic encephalopathy 28/76/8115  . Atrial fibrillation, chronic ([place]) 12/14/2020  . Anemia 12/14/2020  . Hypokalemia 12/14/2020  . History of CVA (cerebrovascular accident) 12/14/2020  . Pressure injury of skin 11/24/2020  . UTI (urinary tract infection) 11/22/2020  . Debility 11/22/2020  . Dementia ([place]) 11/22/2020  . Abnormal cardiac valve  11/22/2020  . DM2 (diabetes mellitus, type 2) ([place]) 11/22/2020    Palliative Care Assessment & Plan   HPI: 87 y.o. female  with past medical history of DM2, HTN, HLD, CVA, chronic A. fib, severe aortic stenosis,moderate pericardial effusion with no tamponade, moderate mitral regurgitation, chronic bladder outlet obstruction with large diverticulum status post stent placement on 11/26/20, and recurrent UTI admitted on 12/14/2020 with AMS and weakness. On 12/14/20, patient presented to ID clinic for follow-up for ESBL UTI and complaint of increased lethargy. ID Dr. Juleen China sent her to ED for admission and treatment with IV antibiotics.She hascompleted 5 days of meropenem. On 2/27 code stroke was called for facial droop. CT scanheadand MRIbrainnegative for acute ischemia/infarct. Throughout  hospitalization, weakness and lethargy have continued. PMT consulted to discuss [place].  Assessment: Follow up with daughter today. Daughter is pleased with additional work up, feeling better and comfortable with patient's care plan.   We discuss her lab results. Discuss plan to dc potentially sedating meds and initiate lactulose.   We again discuss that though patient has made some improvement from yesterday, she may not completely improve to baseline. We discuss her frail body and the toll a significant infection and hospitalization take on her.   Daughter expresses understanding. Understands her mother suffers from multiple chronic conditions but remains hopeful for continued improvement and hopeful for return of good quality of life. Of note, a goal for patient and daughter is for her to reach her 13th birthday coming up in a little over a month.   We discussed palliative following up outpatient and she is agreeable. Briefly discussed involving hospice support when appropriate to help them meet the goal of keeping patient at home.   Recommendations/Plan:  Palliative to f/u outpatient  Most important goal to family/patient is for patient to be at home, no facility placement  Code Status:  Limited code - no CPR  Prognosis:   Unable to determine  Discharge Planning:  Home with [place] was discussed with daughter, Dr. Verlon Au  Thank you for allowing the Palliative Medicine Team to assist in the care of this patient.   Total Time 25 minutes Prolonged Time Billed  no       Greater than 50%  of this time was spent counseling and coordinating care related to the above assessment and plan.  Juel Burrow, DNP, [facility] Palliative Medicine Team Team Phone # [phone]  Pager [phone]

## 2020-12-22 NOTE — Progress Notes (Addendum)
Chart reviewed Patient examined  Quite listless-aorouses, can follow some command but falls back to sleep Note she is better than prior several days acc. to daughter who is at bedside  Meds reviewed--I have held celexa and melatonin Labs reviewed-Ammonia 37  BP 134/72 (BP Location: Left Arm)   Pulse 71   Temp 97.8 F (36.6 C) (Oral)   Resp 20   Ht 5\' 4"  (1.626 m)   Wt 47.6 kg   SpO2 100%   BMI 18.01 kg/m  Awakens but listless after awakening ctab no added sound abd soft nt nd no rebound no guard L heel has dressing, no wound Reflexes brisk  P Await metabolic encephalopathy clearance--starting lactulose 20 qd   have discussed clearly with family this may take several days to a week to improve--should she improve overnight, she can d/c am  Verneita Griffes, MD [facility] 3:42 PM  No charge

## 2020-12-22 NOTE — Progress Notes (Addendum)
Neurology Progress Note  HPI: Neurology was consulted for code stroke on 12/16/20 secondary to acute right facial droop. Imaging was negative for acute process. Her NIHSS was 8. No intervention was done. Symptoms were thought to be recrudescence of old stroke symptoms. Recommendations were for further medical workup by primary team and management of toxic metabolic derangements. No further neurological work up was recommended. Cardiology changed her AC to Eliquis.   Neurology was asked to come and see patient today due to her continual decline at patient's daughter's request.   Palliative care has been consulted and GOC are being addressed.   O: Current vital signs: BP 134/72 (BP Location: Left Arm)   Pulse 71   Temp 97.8 F (36.6 C) (Oral)   Resp 20   Ht 5\' 4"  (1.626 m)   Wt 47.6 kg   SpO2 100%   BMI 18.01 kg/m  Vital signs in last 24 hours: Temp:  [93 F (33.9 C)-98.4 F (36.9 C)] 97.8 F (36.6 C) (03/05 1047) Pulse Rate:  [64-71] 71 (03/05 1000) Resp:  [15-22] 20 (03/05 1000) BP: (125-141)/(52-72) 134/72 (03/05 0519) SpO2:  [95 %-100 %] 100 % (03/05 1000)  GENERAL: She perks up when name called.  HEENT: Normocephalic and atraumatic. Legally blind.  LUNGS: Normal respiratory effort.  CV: RRR Ext: warm  NEURO:  Mental Status: Awake.   Speech/Language: She does not converse. Follows very simple commands and not at other times. Comprehension seems to be intact, but is difficult to discern.   Cranial Nerves:  Face is grossly symmetrical. Eyelids elevate symmetrically. Hearing intact to voice. Will not open mouth for exam. Tone is very decreased as is bulk. Patient unable to tell NP where she is being touched or if symmetrical. Patient is unable to perform strength testing or drift testing. She does make effort to move her extremities but not against gravity.   Medications  Current Facility-Administered Medications:  .  apixaban (ELIQUIS) tablet 2.5 mg, 2.5 mg, Oral, BID,  Rolla Flatten, RPH, 2.5 mg at 12/22/20 1218 .  atorvastatin (LIPITOR) tablet 40 mg, 40 mg, Oral, Daily, Buford Dresser, MD, 40 mg at 12/22/20 1218 .  citalopram (CELEXA) tablet 20 mg, 20 mg, Oral, QHS, Hall, Carole N, DO, 20 mg at 12/21/20 2052 .  donepezil (ARICEPT) tablet 10 mg, 10 mg, Oral, QHS, Hall, Carole N, DO, 10 mg at 12/21/20 2052 .  fenofibrate tablet 160 mg, 160 mg, Oral, QHS, Dahal, Binaya, MD, 160 mg at 12/21/20 2052 .  insulin aspart (novoLOG) injection 0-5 Units, 0-5 Units, Subcutaneous, QHS, Tu, Ching T, DO, 3 Units at 12/18/20 2158 .  insulin aspart (novoLOG) injection 0-9 Units, 0-9 Units, Subcutaneous, TID WC, Tu, Ching T, DO, 2 Units at 12/22/20 1219 .  insulin glargine (LANTUS) injection 5 Units, 5 Units, Subcutaneous, Daily, Dahal, Binaya, MD, 5 Units at 12/22/20 1219 .  melatonin tablet 3 mg, 3 mg, Oral, QHS PRN, Dahal, Binaya, MD, 3 mg at 12/20/20 2210  Pertinent Labs ammonia   37          LDL 78  Imaging During code stroke 12/16/20: MRI head: No acute intracranial process. Multifocal lacunar insults as detailed above. Moderate cerebral atrophy and advanced chronic microvascular ischemic changes. MRA head: No emergent vascular finding within the head. Mild bilateral M2, left superior cerebellar artery and left P3 segment narrowing.   Assessment: Unfortunate 87 yo lady with dementia and history of multiple strokes, chronic medical comorbidities, and multiple infections recently requiring hospital admissions  for IV antibiotics. There was a code stroke on 12/16/20 at which nothing acute was found and we recommended no further neurological workup. Since admission, medications have been withheld that were thought to be negatively influencing her mental status. She continued to have cognitive decline and increasing overall weakness. In speaking with her, the daughter seems to desperately looking for an answer to this significant decline in a month's time.  NP totally understands this as it is difficult to see her mother different than before. Daughter shows NP a video of patient smiling and conversing about a month ago. Daughter does not understand why she hasn't "perked right up" like she has in the past when she was treated for infections.   NP had discussion with daughter that patient's overall decline is a step wise process of all her chronic medical conditions, strokes, infections, multiple hospital stays and dementia causing her decline. Explained that each time patient has an event and/or hospital stay, this decreases her baseline and with every event, the baseline decreases further. NP told her that we do not have a distinct answer to patient's overall decline, but is likely multifactorial from hospital stays, multiple past medical problems, multiple strokes, frailty, etc.    Plan: -agree with palliative care and goals of care discussions -we see no distinct neurological cause for her continued decline and is likely multifactorial as stated above.  -dementia definitely plays a role in her decline.    Pt seen by Clance Boll, MSN, APN-BC/Nurse Practitioner/Neuro and later by MD. Note and plan to be edited as needed by MD.  Pager: [phone]  I have seen the patient reviewed the above notes.  She appears to have a multifactorial delirium which has made some progress.  I suspect that she will have gradual improvement towards a new baseline, unclear exactly what this will be at this time.    I do not think that further neurodiagnostic testing is at all likely to yield further information and I discussed this with the daughter at this time.  Neurology will be available on an as-needed basis.  Roland Rack, MD [facility] [phone]  If 7pm- 7am, please page neurology on call as listed in [place]. \

## 2020-12-22 NOTE — Progress Notes (Signed)
Manufacturing engineer [facility])  Hospital Liaison RN note         Notified of patient/family request for [facility] Palliative services at home after discharge.                  [place] Palliative team will follow up with patient after discharge.         Please call with any hospice or palliative related questions.         Thank you for the opportunity to participate in this patient's care.     Domenic Moras, BSN, RN [facility] Liaison (listed on [place] under Hospice/[facility])    [phone] (24h on call)

## 2020-12-23 LAB — GLUCOSE, CAPILLARY
Glucose-Capillary: 122 mg/dL — ABNORMAL HIGH (ref 70–99)
Glucose-Capillary: 117 mg/dL — ABNORMAL HIGH (ref 70–99)
Glucose-Capillary: 94 mg/dL (ref 70–99)

## 2020-12-23 MED ORDER — LACTULOSE 10 GM/15ML PO SOLN: 20.0000 g | Freq: Every day | ORAL | 0 refills | Status: DC

## 2020-12-23 NOTE — Progress Notes (Signed)
[facility] arrived to transport patient home. Daughter currently not at home. [facility] to transport at a later time.

## 2020-12-23 NOTE — Progress Notes (Signed)
[place] to be discharged Home per MD order. Discussed prescriptions and follow up appointments with the daughter. Medication list explained in detail. Patient verbalized understanding.  Skin clean, dry and intact without evidence of skin break down, no evidence of skin tears noted. IV catheter discontinued and intact. Site without signs and symptoms of complications. Dressing and pressure applied. Pt denies pain at the site currently. No complaints noted.  Patient free of lines, drains, and wounds.   An After Visit Summary (AVS) was printed and given to EMS personnel. Patient escorted via stretcher, and discharged home via [place].  Mikki Santee, RN

## 2020-12-23 NOTE — TOC Transition Note (Signed)
Transition of Care [facility]) - CM/SW Discharge Note   Patient Details  Name: Rebecca Pruitt MRN: [id number] Date of Birth: 22-Oct-1928  Transition of Care [facility]) CM/SW Contact:  Coralee Pesa, [place] Phone Number: 12/23/2020, 12:09 PM   Clinical Narrative:    CSW was notified by nurse that dtr was concerned about transporting her in her personal vehicle and requested non emergency ambulance. CSW confirmed address with dtr and set up transportation. SW will sign off.   Final next level of care: [place] Barriers to Discharge: Barriers Resolved   Patient Goals and CMS Choice Patient states their goals for this hospitalization and ongoing recovery are:: Return home with Home health CMS Medicare.gov Compare Post Acute Care list provided to:: Patient Represenative (must comment) Choice offered to / list presented to : Adult Children  Discharge Placement                       Discharge Plan and Services   Discharge Planning Services: CM Consult Post Acute Care Choice: Home Health          DME Arranged: N/A DME Agency: NA       HH Arranged: RN,PT,OT (resumption of care) [place] Agency: [facility] Date [place]: 12/18/20      Social Determinants of Health (SDOH) Interventions     Readmission Risk Interventions Readmission Risk Prevention Plan 12/20/2020  Transportation Screening Complete  PCP or Specialist Appt within 5-7 Days Complete  Home Care Screening Complete  Medication Review (RN CM) Complete  Some recent data might be hidden

## 2020-12-23 NOTE — Discharge Summary (Signed)
D/c summary  [name] PJA:[id number] DOB: 06/14/1929  PCP: Vicenta Aly, FNP  Admit date: 12/14/2020 Discharge date: 12/23/2020  Time spent: 35 minutes.  Recommendations for Outpatient Follow-up:  1. Follow-up with cardiology in 1 to 2 weeks. 2. Follow-up with urology in 1 to 2 weeks. 3. Follow-up with palliative care medicine in 1 to 2 weeks. 4. Follow-up with your PCP in 1 to 2 weeks. 5. Continue PT OT with assistance and fall precautions. 6. Take your medications as prescribed.  Discharge Diagnoses:  Active Hospital Problems   Diagnosis Date Noted   Acute metabolic encephalopathy 34/19/3790   Atrial fibrillation, chronic (HCC) 12/14/2020   Anemia 12/14/2020   Hypokalemia 12/14/2020   History of CVA (cerebrovascular accident) 12/14/2020   Abnormal cardiac valve 11/22/2020   DM2 (diabetes mellitus, type 2) ([place]) 11/22/2020    Resolved Hospital Problems  No resolved problems to display.    Discharge Condition: Stable.  Diet recommendation: Heart healthy carb modified diet.  Vitals:   12/22/20 2017 12/23/20 0452  BP: (!) 121/48 (!) 151/73  Pulse: 68 68  Resp:  19  Temp: (!) 97.3 F (36.3 C) 98.4 F (36.9 C)  SpO2: 95% 98%    History of present illness:  87 y.o.home dwelling white femalewith PMH significant for DM2, HTN, HLD, CVA, chronic A. fib, severe aortic stenosis, moderate pericardial effusion with no tamponade, moderate mitral regurgitation, chronic bladder outlet obstruction with large diverticulum status post stent placement on 11/26/20, recurrent UTI.    2/3-2/11/22, patient was hospitalized for recurrent UTI and chronicbladder outlet obstruction with large diverticulum. Where she underwent on 2/7 cystoscopy stent placement 12/11/20, patient was at urologist office for stent removal but her urine looked infected and the stent was not removed. She was sent home on empiric treatment with Augmentin. Urine culture came back positive with  50,000 colonies of ESBL E. Coli. 12/14/20, patient presented to ID clinic for follow-up for ESBL UTI, complaint of increased lethargy.   ID Dr. Juleen China sent her to ED for admission and treatment with IV antibiotics.  had completed 5 days of meropenem.  Patient was brought to the ED by family on 12/14/20 for altered mental status and generalized weakness.   Significant events:  12/16/20, code stroke called for facial droop. CT scan head and MRI brain negative for acute ischemia/infarct.  Hospital course complicated by generalized weakness.  She was assessed by PT OT with recommendation for SNF.  Daughter preference is to bring home with home health services.  TOC consulted to assist with arrangement for PT OT, RN, home health aide, CSW.  Discussed case with cardiology, Dr. Harrell Gave, plan is to follow-up outpatient for possible TAVR if she is a candidate.  Discussed with urology, Dr. Milford Cage, urology will arrange for follow-up next week outpatient for possible stent removal.  12/21/20: Patient developed encephalopathy toxic metabolic-unclear if further candidate for any procedures palliative care team consulted to assist with establishing goals of care.  Hospital Course:  Active Problems:   Abnormal cardiac valve   DM2 (diabetes mellitus, type 2) (HCC)   Acute metabolic encephalopathy   Atrial fibrillation, chronic (HCC)   Anemia   Hypokalemia   History of CVA (cerebrovascular accident)  Treated ESBL E. coli UTI, POA Seen by infectious disease, recommended 5 days of meropenem. Blood cultures obtained on 12/14/2020, negative final. Urine culture obtained on 12/14/2020, less than 10,000 colonies, insignificant growth, final.  Newly diagnosed atypical atrial flutter/coarse atrial fibrillation CHA2DS2-VASc score 7 Reported prior history of A. fib  She is currently not on any rate control agents. She is on Eliquis for secondary CVA prevention. Stop aspirin and Plavix, per Dr. Leonie Man,  neurologist recommendation. Continue Eliquis alone. Repeating CBC, monitor H&H. Transfuse hemoglobin less than 8.  Severe aortic stenosis/moderate pericardial effusion/moderate mitral regurgitation. Outpatient evaluation for TAVR per cardiology. Per cardiology needs to recover from current infection prior to evaluation to see if she is a TAVR Candidate.  Although may not be  Acute metabolic encephalopathy in the setting of dementia and prior history of stroke. At baseline the patient is legally blind, oriented to place, makes jokes, and can recognize family members. Reorient as needed Likely combination of use of melatonin Celexa and UTI-improved on discharge discussed in detail with daughter  History of CVA She had a code stroke on 12/16/2020 in the hospital. Imaging did not reveal acute ischemia or infarct. Seen by neurology  And reevaluated by them on 3/5 and they did not feel that there was any further etiology caused by something intracranial and that this was toxic metabolic encephalopathy as above  Dysphagia with history of prior stroke. Seen by speech therapist with recommendation for dysphagia 3 (mechanical soft); Thin liquids, provided via straw, cup.   Medication administration: Crushed with pure. Staff to assist with self-feeding. Full supervision, minimize environmental distractions, small sips/bites/slow rate. Seated upright 90 degrees, keep upright 30 to 60 minutes after meal. Continue aspiration precautions.  Type 2 diabetes with hyperglycemia Hemoglobin A1c 7.5 on 11/23/2020. Resume home regimen at discharge.  Chronic anxiety/depression Discontinue Celexa discontinue melatonin as above  Dementia Continue home Aricept-May consider discontinuation of the same Follow-up with your PCP  Resolved post repletion: Hypokalemia Serum potassium 4.2 from 3.2. Repeat labs and follow results.  Physical debility/generalized weakness. Minimally interactive, weak  appearing, frail. Seen by PT OT with recommendation for SNF. Daughter preference is to bring home with home health services.  TOC consulted to assist with arrangement for PT OT, RN, home health aide, CSW. Continue fall precautions, on blood thinners.  Goals of care Patient and family would benefit from palliative care medicine follow-up outpatient. TOC consulted to assist with referral to palliative care team outpatient. Appreciate TOC's assistance. Palliative care team consulted appreciate input  Code Status: Partial code, no CPR.  Family Communication:  Updated her daughter via phone on 12/21/2020.  Disposition Plan:  Will discharge to home with home health services on 12/23/2020.   Consultants:  Cardiology  Infectious disease.  Neurology.  Palliative care team.  Procedures:  None  Antimicrobials:  Meropenem, completed course.  DVT prophylaxis: Eliquis.   Discharge Exam: BP (!) 151/73 (BP Location: Left Arm)    Pulse 68    Temp 98.4 F (36.9 C) (Oral)    Resp 19    Ht 5\' 4"  (1.626 m)    Wt 47.6 kg    SpO2 98%    BMI 18.01 kg/m   General: 87 y.o. year-old female frail-appearing in no acute distress.  Minimally interactive.  Cardiovascular: Regular rate and rhythm no rubs or gallops.    Respiratory: Clear to auscultation no wheezes or rales.    Abdomen: Soft nontender no bowel sounds present.  Musculoskeletal: No extremity edema bilaterally.  Psychiatry: Mood is appropriate for condition setting.  Discharge Instructions You were cared for by a hospitalist during your hospital stay. If you have any questions about your discharge medications or the care you received while you were in the hospital after you are discharged, you can call the unit and asked  to speak with the hospitalist on call if the hospitalist that took care of you is not available. Once you are discharged, your primary care physician will handle any further medical issues. Please note that  NO REFILLS for any discharge medications will be authorized once you are discharged, as it is imperative that you return to your primary care physician (or establish a relationship with a primary care physician if you do not have one) for your aftercare needs so that they can reassess your need for medications and monitor your lab values.  Discharge Instructions    Discharge patient   Complete by: As directed    Discharge disposition: 01-Home or Self Care   Discharge patient date: 12/23/2020     Allergies as of 12/23/2020      Reactions   Codeine    Syncope       Medication List    STOP taking these medications   amoxicillin-clavulanate 875-125 MG tablet Commonly known as: AUGMENTIN   aspirin 81 MG tablet   clopidogrel 75 MG tablet Commonly known as: PLAVIX   LORazepam 0.5 MG tablet Commonly known as: ATIVAN     TAKE these medications   apixaban 2.5 MG Tabs tablet Commonly known as: ELIQUIS Take 1 tablet (2.5 mg total) by mouth 2 (two) times daily.   atorvastatin 40 MG tablet Commonly known as: LIPITOR Take 1 tablet (40 mg total) by mouth daily.   B-D ULTRAFINE III SHORT PEN 31G X 8 MM Misc Generic drug: Insulin Pen Needle See admin instructions.   citalopram 20 MG tablet Commonly known as: CELEXA Take 20 mg by mouth at bedtime.   Contour Next Test test strip Generic drug: glucose blood 1 each 3 (three) times daily.   CRANBERRY PO Take 4,200 mg by mouth 2 (two) times daily.   diphenhydrAMINE 25 MG tablet Commonly known as: BENADRYL Take 25 mg by mouth every 6 (six) hours as needed for sleep.   donepezil 10 MG tablet Commonly known as: ARICEPT Take 10 mg by mouth at bedtime.   fenofibrate 160 MG tablet Take 160 mg by mouth at bedtime.   furosemide 20 MG tablet Commonly known as: LASIX Take 1 tablet (20 mg total) by mouth daily as needed for fluid or edema (swelling, weight gain >2 lbs). What changed:   when to take this  reasons to take this    insulin NPH-regular Human (70-30) 100 UNIT/ML injection Inject 1-7 Units into the skin 2 (two) times daily as needed (high blood sugar). If blood sugar is over 150 takes 1 unit ; sliding scale   lactulose 10 GM/15ML solution Commonly known as: CHRONULAC Take 30 mLs (20 g total) by mouth daily.   melatonin 3 MG Tabs tablet Take 1 tablet (3 mg total) by mouth at bedtime as needed for up to 20 days.   OXYGEN Inhale 2 L into the lungs at bedtime.            Durable Medical Equipment  (From admission, onward)         Start     Ordered   12/20/20 1233  For home use only DME Hospital bed  Once       Question Answer Comment  Length of Need 12 Months   The above medical condition requires: Patient requires the ability to reposition frequently   Head must be elevated greater than: 30 degrees   Bed type Semi-electric   Hoyer Lift Yes      12/20/20 1234  12/20/20 1233  For home use only DME standard manual wheelchair with seat cushion  Once       Comments: Patient suffers from ambulatory dysfunction which impairs their ability to perform daily activities like walking in the home.  A walker will not resolve issue with performing activities of daily living. A wheelchair will allow patient to safely perform daily activities. Patient can safely propel the wheelchair in the home or has a caregiver who can provide assistance. Length of need 12 months. Accessories: elevating leg rests, wheel locks, extensions and anti-tippers.   12/20/20 1234         Allergies  Allergen Reactions   Codeine     Syncope     Follow-up Information    [facility] Follow up.   Specialty: [place] Why: HHRN/PT/OT/SW services to resume on discharge Contact information: [place] [address] [phone]        Vicenta Aly, [place]. Call in 1 day(s).   Specialty: Nurse Practitioner Why: Please call for a post hospital follow-up appointment. Contact  information: [place] [address] [phone]        Buford Dresser, MD. Call in 1 day(s).   Specialty: Cardiology Why: Please call for a post hospital follow-up appointment. Contact information: [address] [place] [address] [phone]        Remi Haggard, MD. Call in 1 day(s).   Specialty: Urology Why: Please call for a post hospital follow-up appointment. Contact information: [place] [place] [address] [phone]        [facility] [place] Follow up.   Specialty: PALLIATIVE CARE Why: Outpt Palliative Care referral made- they will contact you post discharge for ongoing PC needs Contact information: [address] [place] [id number] [phone]               The results of significant diagnostics from this hospitalization (including imaging, microbiology, ancillary and laboratory) are listed below for reference.    Significant Diagnostic Studies: MR ANGIO HEAD WO CONTRAST  Result Date: 12/16/2020 CLINICAL DATA:  Neuro deficit, acute, stroke suspected EXAM: MRI HEAD WITHOUT CONTRAST MRA HEAD WITHOUT CONTRAST TECHNIQUE: Multiplanar, multiecho pulse sequences of the brain and surrounding structures were obtained without intravenous contrast. Angiographic images of the head were obtained using MRA technique without contrast. COMPARISON:  12/16/2020 and prior. FINDINGS: MRI HEAD FINDINGS Brain: No diffusion-weighted signal abnormality. No intracranial hemorrhage. No midline shift, ventriculomegaly or extra-axial fluid collection. No mass lesion. Moderate cerebral atrophy with ex vacuo dilatation. Chronic left pontine and right thalamic and bilateral corona radiata/centrum semiovale lacunar insults. Advanced chronic microvascular ischemic changes. Vascular: Please see MRA. Skull and upper cervical spine: Normal marrow signal. Sinuses/Orbits: Sequela of bilateral  lens replacement. Clear paranasal sinuses. Pneumatized mastoid air cells. Other: None. MRA HEAD FINDINGS Anterior circulation: Patent ICAs and ophthalmic artery origins. Patent ACAs, trifurcated. Patent MCAs. Mild bilateral M2 segment narrowing. Posterior circulation: Dominant left vertebral artery supplies the basilar. Patent left PICA. Right vertebral terminates as PICA. Diminutive basilar artery, patent. Patent superior cerebellar arteries with mild proximal narrowing on the left. Fetal origin of the bilateral PCAs. Mild left P3 segment narrowing. Anatomic variants: Please see above. IMPRESSION: MRI head: No acute intracranial process. Multifocal lacunar insults as detailed above. Moderate cerebral atrophy and advanced chronic microvascular ischemic changes. MRA head: No emergent vascular finding within the head. Mild bilateral M2, left superior cerebellar  artery and left P3 segment narrowing. Electronically Signed   By: Primitivo Gauze M.D.   On: 12/16/2020 18:56   MR BRAIN WO CONTRAST  Result Date: 12/16/2020 CLINICAL DATA:  Neuro deficit, acute, stroke suspected EXAM: MRI HEAD WITHOUT CONTRAST MRA HEAD WITHOUT CONTRAST TECHNIQUE: Multiplanar, multiecho pulse sequences of the brain and surrounding structures were obtained without intravenous contrast. Angiographic images of the head were obtained using MRA technique without contrast. COMPARISON:  12/16/2020 and prior. FINDINGS: MRI HEAD FINDINGS Brain: No diffusion-weighted signal abnormality. No intracranial hemorrhage. No midline shift, ventriculomegaly or extra-axial fluid collection. No mass lesion. Moderate cerebral atrophy with ex vacuo dilatation. Chronic left pontine and right thalamic and bilateral corona radiata/centrum semiovale lacunar insults. Advanced chronic microvascular ischemic changes. Vascular: Please see MRA. Skull and upper cervical spine: Normal marrow signal. Sinuses/Orbits: Sequela of bilateral lens replacement. Clear paranasal  sinuses. Pneumatized mastoid air cells. Other: None. MRA HEAD FINDINGS Anterior circulation: Patent ICAs and ophthalmic artery origins. Patent ACAs, trifurcated. Patent MCAs. Mild bilateral M2 segment narrowing. Posterior circulation: Dominant left vertebral artery supplies the basilar. Patent left PICA. Right vertebral terminates as PICA. Diminutive basilar artery, patent. Patent superior cerebellar arteries with mild proximal narrowing on the left. Fetal origin of the bilateral PCAs. Mild left P3 segment narrowing. Anatomic variants: Please see above. IMPRESSION: MRI head: No acute intracranial process. Multifocal lacunar insults as detailed above. Moderate cerebral atrophy and advanced chronic microvascular ischemic changes. MRA head: No emergent vascular finding within the head. Mild bilateral M2, left superior cerebellar artery and left P3 segment narrowing. Electronically Signed   By: Primitivo Gauze M.D.   On: 12/16/2020 18:56   DG C-Arm 1-60 Min-No Report  Result Date: 11/26/2020 Fluoroscopy was utilized by the requesting physician.  No radiographic interpretation.   ECHOCARDIOGRAM COMPLETE  Result Date: 11/23/2020    ECHOCARDIOGRAM REPORT   Patient Name:   Rebecca Pruitt Date of Exam: 11/23/2020 Medical Rec #:  [id number]         Height:       64.0 in Accession #:    [id number]        Weight:       132.0 lb Date of Birth:  Jun 06, 1929         BSA:          1.640 m Patient Age:    87 years          BP:           142/66 mmHg Patient Gender: F                 HR:           50 bpm. Exam Location:  Inpatient Procedure: 2D Echo, 3D Echo, Cardiac Doppler and Color Doppler Indications:    I35.0 Nonrheumatic aortic (valve) stenosis  History:        Patient has prior history of Echocardiogram examinations, most                 recent 05/26/2008. CAD, Aortic Valve Disease and Mitral Valve                 Disease, Signs/Symptoms:Altered Mental Status and Murmur; Risk                 Factors:Hypertension and  Diabetes. Cancer. Dementia.  Sonographer:    Roseanna Rainbow RDCS Referring Phys: [place]  Sonographer Comments: Patient could not follow directions. IMPRESSIONS  1. Left ventricular ejection fraction, by estimation, is  65 to 70%. The left ventricle has normal function. The left ventricle has no regional wall motion abnormalities. There is severe left ventricular hypertrophy. Left ventricular diastolic parameters  are consistent with Grade II diastolic dysfunction (pseudonormalization).  2. Right ventricular systolic function is normal. The right ventricular size is normal. There is normal pulmonary artery systolic pressure.  3. Left atrial size was severely dilated.  4. Pericardial effusion - 1.1 cm. Moderate pericardial effusion. The pericardial effusion is lateral to the left ventricle. There is no evidence of cardiac tamponade.  5. The mitral valve is normal in structure. Moderate mitral valve regurgitation. No evidence of mitral stenosis. The mean mitral valve gradient is 4.0 mmHg. Severe mitral annular calcification.  6. The aortic valve is tricuspid. Aortic valve regurgitation is not visualized. Severe aortic valve stenosis. Aortic valve area, by VTI measures 0.84 cm. Aortic valve mean gradient measures 38.3 mmHg. Aortic valve Vmax measures 3.97 m/s.  7. The inferior vena cava is normal in size with greater than 50% respiratory variability, suggesting right atrial pressure of 3 mmHg. FINDINGS  Left Ventricle: Left ventricular ejection fraction, by estimation, is 65 to 70%. The left ventricle has normal function. The left ventricle has no regional wall motion abnormalities. The left ventricular internal cavity size was normal in size. There is  severe left ventricular hypertrophy. Left ventricular diastolic parameters are consistent with Grade II diastolic dysfunction (pseudonormalization). Right Ventricle: The right ventricular size is normal. No increase in right ventricular wall thickness. Right  ventricular systolic function is normal. There is normal pulmonary artery systolic pressure. The tricuspid regurgitant velocity is 2.41 m/s, and  with an assumed right atrial pressure of 3 mmHg, the estimated right ventricular systolic pressure is 25.3 mmHg. Left Atrium: Left atrial size was severely dilated. Right Atrium: Right atrial size was normal in size. Pericardium: Pericardial effusion - 1.1 cm. A moderately sized pericardial effusion is present. The pericardial effusion is lateral to the left ventricle. There is no evidence of cardiac tamponade. Mitral Valve: The mitral valve is normal in structure. There is moderate thickening of the mitral valve leaflet(s). There is moderate calcification of the mitral valve leaflet(s). Severe mitral annular calcification. Moderate mitral valve regurgitation. No evidence of mitral valve stenosis. MV peak gradient, 11.8 mmHg. The mean mitral valve gradient is 4.0 mmHg. Tricuspid Valve: The tricuspid valve is normal in structure. Tricuspid valve regurgitation is not demonstrated. No evidence of tricuspid stenosis. Aortic Valve: The aortic valve is tricuspid. Aortic valve regurgitation is not visualized. Severe aortic stenosis is present. Aortic valve mean gradient measures 38.3 mmHg. Aortic valve peak gradient measures 63.1 mmHg. Aortic valve area, by VTI measures  0.84 cm. Pulmonic Valve: The pulmonic valve was normal in structure. Pulmonic valve regurgitation is trivial. No evidence of pulmonic stenosis. Aorta: The aortic root is normal in size and structure. Venous: The inferior vena cava is normal in size with greater than 50% respiratory variability, suggesting right atrial pressure of 3 mmHg. IAS/Shunts: No atrial level shunt detected by color flow Doppler.  LEFT VENTRICLE PLAX 2D LVIDd:         2.50 cm     Diastology LVIDs:         1.70 cm     LV e' medial:    3.00 cm/s LV PW:         2.20 cm     LV E/e' medial:  46.3 LV IVS:        1.90 cm     LV  e' lateral:   3.50  cm/s LVOT diam:     2.00 cm     LV E/e' lateral: 39.7 LV SV:         94 LV SV Index:   57 LVOT Area:     3.14 cm  LV Volumes (MOD) LV vol d, MOD A2C: 50.7 ml LV vol d, MOD A4C: 58.5 ml LV vol s, MOD A2C: 19.4 ml LV vol s, MOD A4C: 22.0 ml LV SV MOD A2C:     31.3 ml LV SV MOD A4C:     58.5 ml LV SV MOD BP:      38.6 ml RIGHT VENTRICLE            IVC RV S prime:     8.18 cm/s  IVC diam: 1.60 cm TAPSE (M-mode): 1.8 cm LEFT ATRIUM             Index       RIGHT ATRIUM          Index LA diam:        4.30 cm 2.62 cm/m  RA Area:     9.23 cm LA Vol (A2C):   72.6 ml 44.28 ml/m RA Volume:   15.40 ml 9.39 ml/m LA Vol (A4C):   78.5 ml 47.88 ml/m LA Biplane Vol: 74.5 ml 45.44 ml/m  AORTIC VALVE                    PULMONIC VALVE AV Area (Vmax):    1.21 cm     PR End Diast Vel: 2.23 msec AV Area (Vmean):   0.87 cm AV Area (VTI):     0.84 cm AV Vmax:           397.33 cm/s AV Vmean:          291.000 cm/s AV VTI:            1.117 m AV Peak Grad:      63.1 mmHg AV Mean Grad:      38.3 mmHg LVOT Vmax:         153.00 cm/s LVOT Vmean:        80.700 cm/s LVOT VTI:          0.299 m LVOT/AV VTI ratio: 0.27  AORTA Ao Root diam: 3.00 cm Ao Asc diam:  3.10 cm MITRAL VALVE                 TRICUSPID VALVE MV Area (PHT): 1.50 cm      TR Peak grad:   23.2 mmHg MV Area VTI:   1.35 cm      TR Vmax:        241.00 cm/s MV Peak grad:  11.8 mmHg MV Mean grad:  4.0 mmHg      SHUNTS MV Vmax:       1.72 m/s      Systemic VTI:  0.30 m MV Vmean:      91.5 cm/s     Systemic Diam: 2.00 cm MV Decel Time: 507 msec MR Peak grad:    134.6 mmHg MR Mean grad:    82.0 mmHg MR Vmax:         580.00 cm/s MR Vmean:        417.0 cm/s MR PISA:         4.02 cm MR PISA Eff ROA: 26 mm MR PISA Radius:  0.80 cm MV E velocity: 139.00 cm/s MV A velocity: 108.00 cm/s MV E/A ratio:  1.29 Candee Furbish MD Electronically signed by Candee Furbish MD Signature Date/Time: 11/23/2020/11:24:30 AM    Final    CT HEAD CODE STROKE WO CONTRAST  Result Date: 12/16/2020 CLINICAL DATA:   Code stroke.  Neuro deficit, acute, stroke suspected EXAM: CT HEAD WITHOUT CONTRAST TECHNIQUE: Contiguous axial images were obtained from the base of the skull through the vertex without intravenous contrast. COMPARISON:  11/22/2020 and prior. FINDINGS: Brain: No acute infarct or intracranial hemorrhage. No mass lesion. No midline shift, ventriculomegaly or extra-axial fluid collection. Diffuse parenchymal volume loss with ex vacuo dilatation. Chronic microvascular ischemic changes. Vascular: No hyperdense vessel or unexpected calcification. Bilateral skull base atherosclerotic calcifications. Skull: No acute finding.  Sequela of right mastoidectomy. Sinuses/Orbits: Normal orbits. Clear paranasal sinuses. No mastoid effusion. Other: None. ASPECTS [facility] Stroke Program Early CT Score) - Ganglionic level infarction (caudate, lentiform nuclei, internal capsule, insula, M1-M3 cortex): 7 - Supraganglionic infarction (M4-M6 cortex): 3 Total score (0-10 with 10 being normal): 10 IMPRESSION: 1. No acute intracranial process. Advanced chronic microvascular ischemic changes. 2. ASPECTS is 10 3. Code stroke imaging results were communicated on 12/16/2020 at 5:28 pm to provider Dr. Rory Percy via secure text paging. Electronically Signed   By: Primitivo Gauze M.D.   On: 12/16/2020 17:29    Microbiology: Recent Results (from the past 240 hour(s))  Urine culture     Status: Abnormal   Collection Time: 12/14/20  7:49 PM   Specimen: Urine, Random  Result Value Ref Range Status   Specimen Description URINE, RANDOM  Final   Special Requests NONE  Final   Culture (A)  Final    <10,000 COLONIES/mL INSIGNIFICANT GROWTH Performed at [facility], [address], [place], [place] [address]    Report Status 12/16/2020 FINAL  Final  Resp Panel by RT-PCR (Flu A&B, Covid) Nasopharyngeal Swab     Status: None   Collection Time: 12/14/20  8:01 PM   Specimen: Nasopharyngeal Swab; Nasopharyngeal(NP) swabs in vial  transport medium  Result Value Ref Range Status   SARS Coronavirus 2 by RT PCR NEGATIVE NEGATIVE Final    Comment: (NOTE) SARS-CoV-2 target nucleic acids are NOT DETECTED.  The SARS-CoV-2 RNA is generally detectable in upper respiratory specimens during the acute phase of infection. The lowest concentration of SARS-CoV-2 viral copies this assay can detect is 138 copies/mL. A negative result does not preclude SARS-Cov-2 infection and should not be used as the sole basis for treatment or other patient management decisions. A negative result may occur with  improper specimen collection/handling, submission of specimen other than nasopharyngeal swab, presence of viral mutation(s) within the areas targeted by this assay, and inadequate number of viral copies(<138 copies/mL). A negative result must be combined with clinical observations, patient history, and epidemiological information. The expected result is Negative.  Fact Sheet for Patients:  EntrepreneurPulse.com.au  Fact Sheet for Healthcare Providers:  IncredibleEmployment.be  This test is no t yet approved or cleared by the [place] FDA and  has been authorized for detection and/or diagnosis of SARS-CoV-2 by FDA under an Emergency Use Authorization (EUA). This EUA will remain  in effect (meaning this test can be used) for the duration of the COVID-19 declaration under Section 564(b)(1) of the Act, 21 U.S.C.section 360bbb-3(b)(1), unless the authorization is terminated  or revoked sooner.       Influenza A by PCR NEGATIVE NEGATIVE Final   Influenza B by PCR NEGATIVE NEGATIVE Final    Comment: (NOTE) The Xpert Xpress SARS-CoV-2/FLU/RSV plus assay  is intended as an aid in the diagnosis of influenza from Nasopharyngeal swab specimens and should not be used as a sole basis for treatment. Nasal washings and aspirates are unacceptable for Xpert Xpress SARS-CoV-2/FLU/RSV testing.  Fact  Sheet for Patients: EntrepreneurPulse.com.au  Fact Sheet for Healthcare Providers: IncredibleEmployment.be  This test is not yet approved or cleared by the [place] FDA and has been authorized for detection and/or diagnosis of SARS-CoV-2 by FDA under an Emergency Use Authorization (EUA). This EUA will remain in effect (meaning this test can be used) for the duration of the COVID-19 declaration under Section 564(b)(1) of the Act, 21 U.S.C. section 360bbb-3(b)(1), unless the authorization is terminated or revoked.  Performed at [facility], [place] [address] [place], [place] [address]   Culture, blood (routine x 2)     Status: None   Collection Time: 12/14/20  8:01 PM   Specimen: BLOOD RIGHT WRIST  Result Value Ref Range Status   Specimen Description BLOOD RIGHT WRIST  Final   Special Requests   Final    BOTTLES DRAWN AEROBIC AND ANAEROBIC Blood Culture results may not be optimal due to an inadequate volume of blood received in culture bottles   Culture   Final    NO GROWTH 5 DAYS Performed at [facility], [place] [address] [place], [place] [address]    Report Status 12/19/2020 FINAL  Final  Culture, blood (routine x 2)     Status: None   Collection Time: 12/14/20 10:27 PM   Specimen: BLOOD LEFT ARM  Result Value Ref Range Status   Specimen Description BLOOD LEFT ARM  Final   Special Requests   Final    AEROBIC BOTTLE ONLY Blood Culture results may not be optimal due to an inadequate volume of blood received in culture bottles   Culture   Final    NO GROWTH 5 DAYS Performed at [facility], [place] [address] [place], [place] [address]    Report Status 12/19/2020 FINAL  Final     Labs: Basic Metabolic Panel: Recent Labs  Lab 12/17/20 0354 12/18/20 0224 12/21/20 1521  NA 140 137 142  K 4.1 4.2 3.6  CL 103 102 106  CO2 29 26 29   GLUCOSE 136* 189* 296*  BUN 16 20 28*  CREATININE 0.60 0.61 1.05*  CALCIUM 10.0 10.1  10.1  MG  --   --  2.0  PHOS  --   --  2.6   Liver Function Tests: Recent Labs  Lab 12/21/20 1521  AST 15  ALT 9  ALKPHOS 52  BILITOT 0.6  PROT 5.8*  ALBUMIN 2.5*   No results for input(s): LIPASE, AMYLASE in the last 168 hours. Recent Labs  Lab 12/22/20 1408  AMMONIA 37*   CBC: Recent Labs  Lab 12/17/20 0354 12/18/20 0224 12/21/20 1521  WBC 7.6 7.2 8.0  NEUTROABS 3.5 3.6 5.7  HGB 9.6* 9.5* 10.2*  HCT 31.1* 31.2* 32.4*  MCV 91.2 91.2 90.8  PLT 304 314 339   Cardiac Enzymes: No results for input(s): CKTOTAL, CKMB, CKMBINDEX, TROPONINI in the last 168 hours. BNP: BNP (last 3 results) Recent Labs    12/21/20 1521  BNP 477.2*    ProBNP (last 3 results) No results for input(s): PROBNP in the last 8760 hours.  CBG: Recent Labs  Lab 12/22/20 0732 12/22/20 1120 12/22/20 1634 12/22/20 2036 12/23/20 0655  GLUCAP 111* 151* 160* 158* 122*       Signed:  Jai-Gurmukh  Verlon Au, MD [facility] 12/23/2020, 8:28 AM

## 2020-12-27 ENCOUNTER — Telehealth: Payer: Self-pay

## 2020-12-27 NOTE — Telephone Encounter (Signed)
Attempted to contact patient to schedule a Palliative Care consult appointment. No answer left a message to return call.  

## 2020-12-31 ENCOUNTER — Telehealth: Payer: Self-pay

## 2020-12-31 NOTE — Telephone Encounter (Signed)
Attempted to contact patient's daughter [name] to schedule a Palliative Care consult appointment. No answer left a message to return call.

## 2021-01-03 ENCOUNTER — Telehealth: Payer: Self-pay

## 2021-01-03 NOTE — Telephone Encounter (Signed)
Spoke with patient's [name] and scheduled an in-person Palliative Consult for 01/23/21 @ 12PM  COVID screening was negative. There is pets in the home that she will put away. Patient lives with daughter.  Consent obtained; updated Outlook/Netsmart/Team List and Epic.  Family is aware they may be receiving a call from NP the day before or day of to confirm appointment.

## 2021-01-04 ENCOUNTER — Other Ambulatory Visit: Payer: Self-pay

## 2021-01-04 ENCOUNTER — Inpatient Hospital Stay (HOSPITAL_COMMUNITY)
Admission: EM | Admit: 2021-01-04 | Discharge: 2021-01-07 | DRG: 640 | Disposition: A | Discharge status: Home Health | Payer: Medicare Other | Attending: Student | Admitting: Student

## 2021-01-04 ENCOUNTER — Emergency Department (HOSPITAL_COMMUNITY): Payer: Medicare Other

## 2021-01-04 DIAGNOSIS — G9341 Metabolic encephalopathy: Secondary | ICD-10-CM | POA: Diagnosis present

## 2021-01-04 DIAGNOSIS — I82409 Acute embolism and thrombosis of unspecified deep veins of unspecified lower extremity: Secondary | ICD-10-CM

## 2021-01-04 DIAGNOSIS — E861 Hypovolemia: Secondary | ICD-10-CM | POA: Diagnosis present

## 2021-01-04 DIAGNOSIS — I2510 Atherosclerotic heart disease of native coronary artery without angina pectoris: Secondary | ICD-10-CM | POA: Diagnosis present

## 2021-01-04 DIAGNOSIS — E860 Dehydration: Principal | ICD-10-CM | POA: Diagnosis present

## 2021-01-04 DIAGNOSIS — R636 Underweight: Secondary | ICD-10-CM | POA: Diagnosis present

## 2021-01-04 DIAGNOSIS — I82411 Acute embolism and thrombosis of right femoral vein: Secondary | ICD-10-CM

## 2021-01-04 DIAGNOSIS — R262 Difficulty in walking, not elsewhere classified: Secondary | ICD-10-CM | POA: Diagnosis present

## 2021-01-04 DIAGNOSIS — I080 Rheumatic disorders of both mitral and aortic valves: Secondary | ICD-10-CM | POA: Diagnosis present

## 2021-01-04 DIAGNOSIS — Z79899 Other long term (current) drug therapy: Secondary | ICD-10-CM

## 2021-01-04 DIAGNOSIS — R4182 Altered mental status, unspecified: Secondary | ICD-10-CM

## 2021-01-04 DIAGNOSIS — R1310 Dysphagia, unspecified: Secondary | ICD-10-CM | POA: Diagnosis present

## 2021-01-04 DIAGNOSIS — I5032 Chronic diastolic (congestive) heart failure: Secondary | ICD-10-CM | POA: Diagnosis present

## 2021-01-04 DIAGNOSIS — R5381 Other malaise: Secondary | ICD-10-CM | POA: Diagnosis present

## 2021-01-04 DIAGNOSIS — E1165 Type 2 diabetes mellitus with hyperglycemia: Secondary | ICD-10-CM | POA: Diagnosis present

## 2021-01-04 DIAGNOSIS — E785 Hyperlipidemia, unspecified: Secondary | ICD-10-CM | POA: Diagnosis present

## 2021-01-04 DIAGNOSIS — F05 Delirium due to known physiological condition: Secondary | ICD-10-CM | POA: Diagnosis present

## 2021-01-04 DIAGNOSIS — F0390 Unspecified dementia without behavioral disturbance: Secondary | ICD-10-CM | POA: Diagnosis present

## 2021-01-04 DIAGNOSIS — E119 Type 2 diabetes mellitus without complications: Secondary | ICD-10-CM

## 2021-01-04 DIAGNOSIS — Z87440 Personal history of urinary (tract) infections: Secondary | ICD-10-CM

## 2021-01-04 DIAGNOSIS — N390 Urinary tract infection, site not specified: Secondary | ICD-10-CM | POA: Diagnosis present

## 2021-01-04 DIAGNOSIS — E1169 Type 2 diabetes mellitus with other specified complication: Secondary | ICD-10-CM | POA: Diagnosis present

## 2021-01-04 DIAGNOSIS — Z794 Long term (current) use of insulin: Secondary | ICD-10-CM

## 2021-01-04 DIAGNOSIS — I110 Hypertensive heart disease with heart failure: Secondary | ICD-10-CM | POA: Diagnosis present

## 2021-01-04 DIAGNOSIS — H9190 Unspecified hearing loss, unspecified ear: Secondary | ICD-10-CM | POA: Diagnosis present

## 2021-01-04 DIAGNOSIS — Z681 Body mass index (BMI) 19 or less, adult: Secondary | ICD-10-CM

## 2021-01-04 DIAGNOSIS — Z8673 Personal history of transient ischemic attack (TIA), and cerebral infarction without residual deficits: Secondary | ICD-10-CM

## 2021-01-04 DIAGNOSIS — I482 Chronic atrial fibrillation, unspecified: Secondary | ICD-10-CM | POA: Diagnosis present

## 2021-01-04 DIAGNOSIS — Z20822 Contact with and (suspected) exposure to covid-19: Secondary | ICD-10-CM | POA: Diagnosis present

## 2021-01-04 DIAGNOSIS — Z7901 Long term (current) use of anticoagulants: Secondary | ICD-10-CM

## 2021-01-04 LAB — CBC WITH DIFFERENTIAL/PLATELET
Abs Immature Granulocytes: 0.03 10*3/uL (ref 0.00–0.07)
Basophils Absolute: 0 10*3/uL (ref 0.0–0.1)
Basophils Relative: 1 %
Eosinophils Absolute: 0.3 10*3/uL (ref 0.0–0.5)
Eosinophils Relative: 4 %
HCT: 35.6 % — ABNORMAL LOW (ref 36.0–46.0)
Hemoglobin: 11.2 g/dL — ABNORMAL LOW (ref 12.0–15.0)
Immature Granulocytes: 0 %
Lymphocytes Relative: 19 %
Lymphs Abs: 1.5 10*3/uL (ref 0.7–4.0)
MCH: 27.4 pg (ref 26.0–34.0)
MCHC: 31.5 g/dL (ref 30.0–36.0)
MCV: 87 fL (ref 80.0–100.0)
Monocytes Absolute: 1.2 10*3/uL — ABNORMAL HIGH (ref 0.1–1.0)
Monocytes Relative: 15 %
Neutro Abs: 5.2 10*3/uL (ref 1.7–7.7)
Neutrophils Relative %: 61 %
Platelets: 387 10*3/uL (ref 150–400)
RBC: 4.09 MIL/uL (ref 3.87–5.11)
RDW: 15.1 % (ref 11.5–15.5)
WBC: 8.3 10*3/uL (ref 4.0–10.5)
nRBC: 0 % (ref 0.0–0.2)

## 2021-01-04 LAB — COMPREHENSIVE METABOLIC PANEL
ALT: 14 U/L (ref 0–44)
AST: 28 U/L (ref 15–41)
Albumin: 2.8 g/dL — ABNORMAL LOW (ref 3.5–5.0)
Alkaline Phosphatase: 37 U/L — ABNORMAL LOW (ref 38–126)
Anion gap: 9 (ref 5–15)
BUN: 26 mg/dL — ABNORMAL HIGH (ref 8–23)
CO2: 25 mmol/L (ref 22–32)
Calcium: 10.2 mg/dL (ref 8.9–10.3)
Chloride: 105 mmol/L (ref 98–111)
Creatinine, Ser: 0.84 mg/dL (ref 0.44–1.00)
GFR, Estimated: >60 mL/min (ref >60)
Glucose, Bld: 262 mg/dL — ABNORMAL HIGH (ref 70–99)
Potassium: 4.8 mmol/L (ref 3.5–5.1)
Sodium: 139 mmol/L (ref 135–145)
Total Bilirubin: 1.1 mg/dL (ref 0.3–1.2)
Total Protein: 6.7 g/dL (ref 6.5–8.1)

## 2021-01-04 LAB — URINALYSIS, ROUTINE W REFLEX MICROSCOPIC
Bacteria, UA: NONE SEEN
Bilirubin Urine: NEGATIVE
Glucose, UA: 150 mg/dL — AB
Ketones, ur: NEGATIVE mg/dL
Nitrite: NEGATIVE
Protein, ur: 100 mg/dL — AB
RBC / HPF: >50 RBC/hpf — ABNORMAL HIGH (ref 0–5)
Specific Gravity, Urine: 1.012 (ref 1.005–1.030)
WBC, UA: >50 WBC/hpf — ABNORMAL HIGH (ref 0–5)
pH: 6 (ref 5.0–8.0)

## 2021-01-04 LAB — CBG MONITORING, ED: Glucose-Capillary: 262 mg/dL — ABNORMAL HIGH (ref 70–99)

## 2021-01-04 LAB — LACTIC ACID, PLASMA: Lactic Acid, Venous: 1.7 mmol/L (ref 0.5–1.9)

## 2021-01-04 MED ORDER — SODIUM CHLORIDE 0.9 % IV SOLN: Freq: Once | INTRAVENOUS | Status: AC

## 2021-01-04 MED ORDER — SODIUM CHLORIDE 0.9 % IV SOLN
1.0000 g | Freq: Once | INTRAVENOUS | Status: AC
  Administered 2021-01-04: 1 g via INTRAVENOUS
  Filled 2021-01-04: qty 10

## 2021-01-04 NOTE — ED Notes (Signed)
Unsuccessful phlebotomy attempt 

## 2021-01-04 NOTE — ED Triage Notes (Signed)
Reports patient has a bump on her calf for the past 2 weeks, got a [place] yesterday and they found a 'right lower extremity DVT' per the report.

## 2021-01-04 NOTE — ED Notes (Signed)
Patient transported to CT 

## 2021-01-04 NOTE — ED Provider Notes (Addendum)
[place] DEPT Provider Note   CSN: [id number] Arrival date & time: 01/04/21  1736     History Chief Complaint  Patient presents with   DVT   Altered Mental Status    Rebecca Pruitt is a 87 y.o. female.  Patient's daughter is at bedside.  She reports that over the last 3 days patient has had a decrease in her mental status.  Patient reports that she is not alert as she normally is.  She denies patient having any fevers, vomiting, diarrhea, changes in urination.  Denies any recent falls or injuries.  Patient has been eating and drinking normally at home.  Patient's daughter reports that she was at [facility] earlier today where DVT study was performed.  Showing right lower extremity DVT.  Patient's daughter states that patient has had difficulty walking on her leg for the past few days due to complaints of pain.  Patient is on Eliquis 2.5 mg twice daily.  Per chart review patient had US venous lower extremity right, UA, CMP, CBC, ammonia performed at [facility] earlier today.  No leukocytosis noted on previous lab work.  Hemoglobin slightly lower at 10.7.  Urinalysis shows +4 bacteria, RBC 20-30, WBC greater than 50, 500 leukocyte esterase, nitrite negative, squamous epithelial cells 0-2.  Ammonia within normal limits.  Ultrasound study showed right lower extremity DVT, complex collection within the soft tissues of the right calf suggesting hematoma.  Per chart review patient was admitted 11/22/20-2/11/122 for cute cystitis.  This admission patient had stent placed by Dr. Milford Cage. Patient was admitted 12/14/2020-12/23/20 for recurrent UTI.  Previous urine culture showed no significant growth.  HPI     Past Medical History:  Diagnosis Date   Cancer ([place])    skin   Coronary artery disease    Diabetes mellitus    Diabetic retinopathy    Dyslipidemia    Family history of adverse reaction to anesthesia    pts  daughter vomits and has very difficult time to awaken   H/O: CVA (cardiovascular accident)    Hearing loss    Heart murmur    History of kidney stones    Hypertension    Stroke ([place]) 03/2020   x5    Patient Active Problem List   Diagnosis Date Noted   Acute metabolic encephalopathy 11/91/4782   Atrial fibrillation, chronic ([place]) 12/14/2020   Anemia 12/14/2020   Hypokalemia 12/14/2020   History of CVA (cerebrovascular accident) 12/14/2020   Pressure injury of skin 11/24/2020   UTI (urinary tract infection) 11/22/2020   Debility 11/22/2020   Dementia ([place]) 11/22/2020   Abnormal cardiac valve 11/22/2020   DM2 (diabetes mellitus, type 2) ([place]) 11/22/2020    Past Surgical History:  Procedure Laterality Date   CESAREAN SECTION     CYSTOSCOPY WITH RETROGRADE PYELOGRAM, URETEROSCOPY AND STENT PLACEMENT Right 11/26/2020   Procedure: CYSTOSCOPY WITH RIGHT RETROGRADE PYELOGRAM, RIGHT URETEROSCOPY AND PYELOSCOPY, RIGHT STENT PLACEMENT, FULGERATION OF RIGHT URETER AND BLADDER;  Surgeon: Remi Haggard, MD;  Location: [facility] ORS;  Service: Urology;  Laterality: Right;   DILATION AND CURETTAGE OF UTERUS     EYE SURGERY Bilateral    hysterectomy - unknown type     MASTOIDECTOMY     as a child     OB History   No obstetric history on file.     Family History  Problem Relation Age of Onset   Prostate cancer Other     Social History   Tobacco  Use   Smoking status: Never Smoker   Smokeless tobacco: Never Used  Vaping Use   Vaping Use: Never used  Substance Use Topics   Alcohol use: No   Drug use: Never    Home Medications Prior to Admission medications   Medication Sig Start Date End Date Taking? Authorizing Provider  apixaban (ELIQUIS) 2.5 MG TABS tablet Take 1 tablet (2.5 mg total) by mouth 2 (two) times daily. 12/20/20 03/20/21  Kayleen Memos, DO  atorvastatin (LIPITOR) 40 MG tablet Take 1 tablet (40 mg total) by mouth daily. 12/21/20 03/21/21  Kayleen Memos, DO  CONTOUR NEXT TEST test strip 1 each 3 (three) times daily. 12/05/20   [provider]  CRANBERRY PO Take 4,200 mg by mouth 2 (two) times daily.    [provider]  donepezil (ARICEPT) 10 MG tablet Take 10 mg by mouth at bedtime.    [provider]  fenofibrate 160 MG tablet Take 160 mg by mouth at bedtime.    [provider]  furosemide (LASIX) 20 MG tablet Take 1 tablet (20 mg total) by mouth daily as needed for fluid or edema (swelling, weight gain >2 lbs). 12/21/20   Kayleen Memos, DO  insulin NPH-insulin regular (NOVOLIN 70/30) (70-30) 100 UNIT/ML injection Inject 1-7 Units into the skin 2 (two) times daily as needed (high blood sugar). If blood sugar is over 150 takes 1 unit ; sliding scale    [provider]  Insulin Pen Needle (B-D ULTRAFINE III SHORT PEN) 31G X 8 MM MISC See admin instructions. 12/13/20   [provider]  lactulose (CHRONULAC) 10 GM/15ML solution Take 30 mLs (20 g total) by mouth daily. 12/23/20   Nita Sells, MD  melatonin 3 MG TABS tablet Take 1 tablet (3 mg total) by mouth at bedtime as needed for up to 20 days. 12/20/20 01/09/21  Kayleen Memos, DO  OXYGEN Inhale 2 L into the lungs at bedtime.    [provider]    Allergies    Codeine  Review of Systems   Review of Systems  Unable to perform ROS: Dementia    Physical Exam Updated Vital Signs BP (!) 129/49    Pulse 76    Temp (!) 97.5 F (36.4 C) (Oral)    Resp 16    Ht 5\' 4"  (1.626 m)    Wt 48 kg    SpO2 100%    BMI 18.16 kg/m   Physical Exam Vitals and nursing note reviewed.  Constitutional:      General: She is not in acute distress.    Appearance: She is not ill-appearing, toxic-appearing or diaphoretic.  HENT:     Head: Normocephalic and atraumatic.  Eyes:     General: No scleral icterus.       Right eye: No discharge.        Left eye: No discharge.     Comments: Patient is legally blind   Cardiovascular:     Rate  and Rhythm: Normal rate.     Pulses:          Dorsalis pedis pulses are 3+ on the right side and 3+ on the left side.       Posterior tibial pulses are 3+ on the right side.     Heart sounds: Murmur heard.    Pulmonary:     Effort: Pulmonary effort is normal. No respiratory distress.     Breath sounds: Normal breath sounds. No stridor. No  wheezing, rhonchi or rales.  Abdominal:     General: There is no distension. There are no signs of injury.     Palpations: Abdomen is soft. There is no mass or pulsatile mass.     Tenderness: There is no abdominal tenderness. There is no guarding or rebound.     Hernia: There is no hernia in the umbilical area or ventral area.  Musculoskeletal:     Cervical back: Neck supple.     Right lower leg: No swelling, deformity, lacerations, tenderness or bony tenderness. No edema.     Left lower leg: No swelling, deformity, lacerations, tenderness or bony tenderness. No edema.     Comments: Patient has hematoma to medial aspect of right lower extremity  Skin:    General: Skin is warm and dry.     Coloration: Skin is not jaundiced or pale.  Neurological:     General: No focal deficit present.     GCS: GCS eye subscore is 4. GCS verbal subscore is 4. GCS motor subscore is 6.     Cranial Nerves: No facial asymmetry.     Comments: Patient able to follow commands.  Patient able to move all extremities without difficulty.  Patient has equal grip strength.    Per patient's daughter patient is not as alert as normal   Psychiatric:        Behavior: Behavior is cooperative.     ED Results / Procedures / Treatments   Labs (all labs ordered are listed, but only abnormal results are displayed) Labs Reviewed  URINALYSIS, ROUTINE W REFLEX MICROSCOPIC - Abnormal; Notable for the following components:      Result Value   APPearance TURBID (*)    Glucose, UA 150 (*)    Hgb urine dipstick MODERATE (*)    Protein, ur 100 (*)    Leukocytes,Ua LARGE (*)    RBC /  HPF >50 (*)    WBC, UA >50 (*)    Non Squamous Epithelial 6-10 (*)    All other components within normal limits  CBC WITH DIFFERENTIAL/PLATELET - Abnormal; Notable for the following components:   Hemoglobin 11.2 (*)    HCT 35.6 (*)    Monocytes Absolute 1.2 (*)    All other components within normal limits  COMPREHENSIVE METABOLIC PANEL - Abnormal; Notable for the following components:   Glucose, Bld 262 (*)    BUN 26 (*)    Albumin 2.8 (*)    Alkaline Phosphatase 37 (*)    All other components within normal limits  CBG MONITORING, ED - Abnormal; Notable for the following components:   Glucose-Capillary 262 (*)    All other components within normal limits  URINE CULTURE  CULTURE, BLOOD (ROUTINE X 2)  CULTURE, BLOOD (ROUTINE X 2)  SARS CORONAVIRUS 2 (TAT 6-24 HRS)  LACTIC ACID, PLASMA  CBC WITH DIFFERENTIAL/PLATELET  LACTIC ACID, PLASMA    EKG None  Radiology CT Head Wo Contrast  Result Date: 01/04/2021 CLINICAL DATA:  Mental status changes EXAM: CT HEAD WITHOUT CONTRAST TECHNIQUE: Contiguous axial images were obtained from the base of the skull through the vertex without intravenous contrast. COMPARISON:  12/16/2020 FINDINGS: Brain: There is atrophy and chronic small vessel disease changes. No acute intracranial abnormality. Specifically, no hemorrhage, hydrocephalus, mass lesion, acute infarction, or significant intracranial injury. Vascular: No hyperdense vessel or unexpected calcification. Skull: No acute calvarial abnormality. Sinuses/Orbits: No acute findings Other: None IMPRESSION: Atrophy, chronic microvascular disease. No acute intracranial abnormality. Electronically Signed  By: Rolm Baptise M.D.   On: 01/04/2021 23:33    Procedures Procedures   Medications Ordered in ED Medications  cefTRIAXone (ROCEPHIN) 1 g in sodium chloride 0.9 % 100 mL IVPB (1 g Intravenous New Bag/Given 01/04/21 2329)  0.9 %  sodium chloride infusion ( Intravenous Bolus from Bag 01/04/21 2201)     ED Course  I have reviewed the triage vital signs and the nursing notes.  Pertinent labs & imaging results that were available during my care of the patient were reviewed by me and considered in my medical decision making (see chart for details).    MDM Rules/Calculators/A&P                          87 year old female in no acute distress, nontoxic-appearing.  Has history of dementia.  Patient's daughter is at bedside.  History was obtained from patient's daughter. Reports she has not been as alert or acting herself the last 3 days.  States that patient has been admitted to the hospital twice recently for urinary tract infections stent placed.  States that earlier today patient was seen at [facility] where she had an ultrasound study that showed a DVT in her right lower extremity.  States that patient was recently switched to Eliquis 2.5 mg twice a day.  On physical exam patient has no focal neurological deficits.  Shee is able to follow commands.  Hematoma noted to medial aspect of right lower extremity.  Pulse, motor, and sensation intact to right foot.  No erythema, wounds, or rash noted to right lower extremity.  No tenderness to right lower extremity.  Abdomen soft, nondistended, nontender.  Head is atraumatic.  Concern for recurrent urinary tract infection as UA obtained earlier at [facility] showed +4 bacteria.  Will obtain CMP, CBC, UA, urine culture, lactic acid, blood culture, and head CT scan.  CBC shows hemoglobin at 11.2 and hematocrit at 35.6, This is improved from 2 weeks prior when hemoglobin was 10.2 and hematocrit was 32.4.  CMP shows Leukos elevated at 262.  Patient has a history of diabetes and daughter reports that she has not had all of her diabetes medications today as daughter was concerned for decreased food intake while at the hospital.    UA shows leukocytes large, RBC greater than 50, WBC greater than 50, bacteria none seen.   Continued concern for recurrent urinary tract infection as patient's earlier urinalysis showed +4 bacteria.  Per chart review previous urine cultures did not show any growth.  We will start patient on ceftriaxone and admit to hospitalist.  Patient's daughter is amenable with this plan.  Lactic acid 1.7, no tachycardia, patient afebrile, no leukocytosis; low suspicion for urosepsis.  Computer turned off music  2348 Spoke to Dr.MacNeil who will see the patient for admission.  Discussed patient's right lower extremity DVT.  Final Clinical Impression(s) / ED Diagnoses Final diagnoses:  Recurrent UTI  Altered mental status, unspecified altered mental status type  Acute deep vein thrombosis (DVT) of femoral vein of right lower extremity [facility])    Rx / DC Orders ED Discharge Orders    None       Loni Beckwith, PA-C 01/05/21 0219    Loni Beckwith, PA-C 01/05/21 0304    Breck Coons, MD 01/06/21 [phone]

## 2021-01-04 NOTE — ED Notes (Signed)
Pt returned to room  

## 2021-01-04 NOTE — ED Notes (Signed)
Pt brief changed. Purewick placed.

## 2021-01-05 ENCOUNTER — Other Ambulatory Visit: Payer: Self-pay

## 2021-01-05 DIAGNOSIS — Z20822 Contact with and (suspected) exposure to covid-19: Secondary | ICD-10-CM | POA: Diagnosis present

## 2021-01-05 DIAGNOSIS — G9341 Metabolic encephalopathy: Secondary | ICD-10-CM | POA: Diagnosis present

## 2021-01-05 DIAGNOSIS — F05 Delirium due to known physiological condition: Secondary | ICD-10-CM | POA: Diagnosis present

## 2021-01-05 DIAGNOSIS — Z87440 Personal history of urinary (tract) infections: Secondary | ICD-10-CM | POA: Diagnosis not present

## 2021-01-05 DIAGNOSIS — E861 Hypovolemia: Secondary | ICD-10-CM | POA: Diagnosis present

## 2021-01-05 DIAGNOSIS — I82409 Acute embolism and thrombosis of unspecified deep veins of unspecified lower extremity: Secondary | ICD-10-CM

## 2021-01-05 DIAGNOSIS — R410 Disorientation, unspecified: Secondary | ICD-10-CM | POA: Diagnosis not present

## 2021-01-05 DIAGNOSIS — I82411 Acute embolism and thrombosis of right femoral vein: Secondary | ICD-10-CM

## 2021-01-05 DIAGNOSIS — F0390 Unspecified dementia without behavioral disturbance: Secondary | ICD-10-CM | POA: Diagnosis present

## 2021-01-05 DIAGNOSIS — I2510 Atherosclerotic heart disease of native coronary artery without angina pectoris: Secondary | ICD-10-CM | POA: Diagnosis present

## 2021-01-05 DIAGNOSIS — Z7901 Long term (current) use of anticoagulants: Secondary | ICD-10-CM | POA: Diagnosis not present

## 2021-01-05 DIAGNOSIS — H547 Unspecified visual loss: Secondary | ICD-10-CM

## 2021-01-05 DIAGNOSIS — Z730 Burn-out: Secondary | ICD-10-CM

## 2021-01-05 DIAGNOSIS — Z7189 Other specified counseling: Secondary | ICD-10-CM | POA: Diagnosis not present

## 2021-01-05 DIAGNOSIS — I080 Rheumatic disorders of both mitral and aortic valves: Secondary | ICD-10-CM | POA: Diagnosis present

## 2021-01-05 DIAGNOSIS — R8281 Pyuria: Secondary | ICD-10-CM | POA: Diagnosis not present

## 2021-01-05 DIAGNOSIS — I5032 Chronic diastolic (congestive) heart failure: Secondary | ICD-10-CM | POA: Diagnosis present

## 2021-01-05 DIAGNOSIS — E1165 Type 2 diabetes mellitus with hyperglycemia: Secondary | ICD-10-CM | POA: Diagnosis present

## 2021-01-05 DIAGNOSIS — H9193 Unspecified hearing loss, bilateral: Secondary | ICD-10-CM

## 2021-01-05 DIAGNOSIS — E785 Hyperlipidemia, unspecified: Secondary | ICD-10-CM | POA: Diagnosis present

## 2021-01-05 DIAGNOSIS — Z515 Encounter for palliative care: Secondary | ICD-10-CM | POA: Diagnosis not present

## 2021-01-05 DIAGNOSIS — R1310 Dysphagia, unspecified: Secondary | ICD-10-CM | POA: Diagnosis present

## 2021-01-05 DIAGNOSIS — I350 Nonrheumatic aortic (valve) stenosis: Secondary | ICD-10-CM

## 2021-01-05 DIAGNOSIS — R531 Weakness: Secondary | ICD-10-CM | POA: Diagnosis not present

## 2021-01-05 DIAGNOSIS — H9190 Unspecified hearing loss, unspecified ear: Secondary | ICD-10-CM | POA: Diagnosis present

## 2021-01-05 DIAGNOSIS — Z681 Body mass index (BMI) 19 or less, adult: Secondary | ICD-10-CM | POA: Diagnosis not present

## 2021-01-05 DIAGNOSIS — R5381 Other malaise: Secondary | ICD-10-CM

## 2021-01-05 DIAGNOSIS — I340 Nonrheumatic mitral (valve) insufficiency: Secondary | ICD-10-CM

## 2021-01-05 DIAGNOSIS — Z8673 Personal history of transient ischemic attack (TIA), and cerebral infarction without residual deficits: Secondary | ICD-10-CM | POA: Diagnosis not present

## 2021-01-05 DIAGNOSIS — I482 Chronic atrial fibrillation, unspecified: Secondary | ICD-10-CM | POA: Diagnosis present

## 2021-01-05 DIAGNOSIS — I82401 Acute embolism and thrombosis of unspecified deep veins of right lower extremity: Secondary | ICD-10-CM

## 2021-01-05 DIAGNOSIS — N3001 Acute cystitis with hematuria: Secondary | ICD-10-CM

## 2021-01-05 DIAGNOSIS — E1169 Type 2 diabetes mellitus with other specified complication: Secondary | ICD-10-CM | POA: Diagnosis present

## 2021-01-05 DIAGNOSIS — E860 Dehydration: Principal | ICD-10-CM

## 2021-01-05 DIAGNOSIS — I110 Hypertensive heart disease with heart failure: Secondary | ICD-10-CM | POA: Diagnosis present

## 2021-01-05 DIAGNOSIS — Z794 Long term (current) use of insulin: Secondary | ICD-10-CM | POA: Diagnosis not present

## 2021-01-05 DIAGNOSIS — E119 Type 2 diabetes mellitus without complications: Secondary | ICD-10-CM | POA: Diagnosis not present

## 2021-01-05 DIAGNOSIS — R636 Underweight: Secondary | ICD-10-CM | POA: Diagnosis present

## 2021-01-05 LAB — COMPREHENSIVE METABOLIC PANEL
ALT: 9 U/L (ref 0–44)
AST: 14 U/L — ABNORMAL LOW (ref 15–41)
Albumin: 2.8 g/dL — ABNORMAL LOW (ref 3.5–5.0)
Alkaline Phosphatase: 35 U/L — ABNORMAL LOW (ref 38–126)
Anion gap: 9 (ref 5–15)
BUN: 22 mg/dL (ref 8–23)
CO2: 25 mmol/L (ref 22–32)
Calcium: 9.9 mg/dL (ref 8.9–10.3)
Chloride: 109 mmol/L (ref 98–111)
Creatinine, Ser: 0.68 mg/dL (ref 0.44–1.00)
GFR, Estimated: >60 mL/min (ref >60)
Glucose, Bld: 180 mg/dL — ABNORMAL HIGH (ref 70–99)
Potassium: 3.5 mmol/L (ref 3.5–5.1)
Sodium: 143 mmol/L (ref 135–145)
Total Bilirubin: 0.5 mg/dL (ref 0.3–1.2)
Total Protein: 6.5 g/dL (ref 6.5–8.1)

## 2021-01-05 LAB — CBG MONITORING, ED: Glucose-Capillary: 194 mg/dL — ABNORMAL HIGH (ref 70–99)

## 2021-01-05 LAB — CBC
HCT: 34.1 % — ABNORMAL LOW (ref 36.0–46.0)
Hemoglobin: 10.3 g/dL — ABNORMAL LOW (ref 12.0–15.0)
MCH: 27 pg (ref 26.0–34.0)
MCHC: 30.2 g/dL (ref 30.0–36.0)
MCV: 89.3 fL (ref 80.0–100.0)
Platelets: 401 10*3/uL — ABNORMAL HIGH (ref 150–400)
RBC: 3.82 MIL/uL — ABNORMAL LOW (ref 3.87–5.11)
RDW: 14.9 % (ref 11.5–15.5)
WBC: 6.2 10*3/uL (ref 4.0–10.5)
nRBC: 0 % (ref 0.0–0.2)

## 2021-01-05 LAB — GLUCOSE, CAPILLARY
Glucose-Capillary: 174 mg/dL — ABNORMAL HIGH (ref 70–99)
Glucose-Capillary: 144 mg/dL — ABNORMAL HIGH (ref 70–99)
Glucose-Capillary: 123 mg/dL — ABNORMAL HIGH (ref 70–99)
Glucose-Capillary: 201 mg/dL — ABNORMAL HIGH (ref 70–99)

## 2021-01-05 LAB — LACTIC ACID, PLASMA: Lactic Acid, Venous: 1 mmol/L (ref 0.5–1.9)

## 2021-01-05 LAB — VITAMIN D 25 HYDROXY (VIT D DEFICIENCY, FRACTURES): Vit D, 25-Hydroxy: 29.5 ng/mL — ABNORMAL LOW (ref 30–100)

## 2021-01-05 LAB — SARS CORONAVIRUS 2 (TAT 6-24 HRS): SARS Coronavirus 2: NEGATIVE

## 2021-01-05 MED ORDER — ONDANSETRON HCL 4 MG PO TABS: 4.0000 mg | ORAL_TABLET | Freq: Four times a day (QID) | ORAL | Status: DC | PRN

## 2021-01-05 MED ORDER — ACETAMINOPHEN 650 MG RE SUPP: 650.0000 mg | Freq: Four times a day (QID) | RECTAL | Status: DC | PRN

## 2021-01-05 MED ORDER — ONDANSETRON HCL 4 MG/2ML IJ SOLN: 4.0000 mg | Freq: Four times a day (QID) | INTRAMUSCULAR | Status: DC | PRN

## 2021-01-05 MED ORDER — SODIUM CHLORIDE 0.9 % IV SOLN: 1.0000 g | Freq: Three times a day (TID) | INTRAVENOUS | Status: DC

## 2021-01-05 MED ORDER — ALBUTEROL SULFATE (2.5 MG/3ML) 0.083% IN NEBU: 2.5000 mg | INHALATION_SOLUTION | RESPIRATORY_TRACT | Status: DC | PRN

## 2021-01-05 MED ORDER — INSULIN ASPART 100 UNIT/ML ~~LOC~~ SOLN
0.0000 [IU] | Freq: Three times a day (TID) | SUBCUTANEOUS | Status: DC
  Administered 2021-01-05: 1 [IU] via SUBCUTANEOUS
  Administered 2021-01-05: 2 [IU] via SUBCUTANEOUS
  Administered 2021-01-05: 1 [IU] via SUBCUTANEOUS
  Administered 2021-01-06: 2 [IU] via SUBCUTANEOUS
  Administered 2021-01-06 – 2021-01-07 (×3): 1 [IU] via SUBCUTANEOUS
  Filled 2021-01-05: qty 0.09

## 2021-01-05 MED ORDER — ACETAMINOPHEN 325 MG PO TABS: 650.0000 mg | ORAL_TABLET | Freq: Four times a day (QID) | ORAL | Status: DC | PRN

## 2021-01-05 MED ORDER — DONEPEZIL HCL 10 MG PO TABS
10.0000 mg | ORAL_TABLET | Freq: Every day | ORAL | Status: DC
  Administered 2021-01-05 – 2021-01-06 (×2): 10 mg via ORAL
  Filled 2021-01-05 (×2): qty 1

## 2021-01-05 MED ORDER — APIXABAN 5 MG PO TABS: 10.0000 mg | ORAL_TABLET | Freq: Two times a day (BID) | ORAL | Status: DC

## 2021-01-05 MED ORDER — SODIUM CHLORIDE 0.9 % IV SOLN
1.0000 g | Freq: Two times a day (BID) | INTRAVENOUS | Status: DC
  Administered 2021-01-05 – 2021-01-06 (×3): 1 g via INTRAVENOUS
  Filled 2021-01-05 (×4): qty 1

## 2021-01-05 MED ORDER — LACTATED RINGERS IV SOLN: INTRAVENOUS | Status: DC

## 2021-01-05 MED ORDER — APIXABAN 5 MG PO TABS: 5.0000 mg | ORAL_TABLET | Freq: Two times a day (BID) | ORAL | Status: DC

## 2021-01-05 MED ORDER — ATORVASTATIN CALCIUM 40 MG PO TABS
40.0000 mg | ORAL_TABLET | Freq: Every day | ORAL | Status: DC
Start: 2021-01-05 — End: 2021-01-07
  Administered 2021-01-05 – 2021-01-07 (×3): 40 mg via ORAL
  Filled 2021-01-05 (×3): qty 1

## 2021-01-05 MED ORDER — LACTULOSE 10 GM/15ML PO SOLN
20.0000 g | Freq: Every day | ORAL | Status: DC
  Administered 2021-01-05 – 2021-01-07 (×3): 20 g via ORAL
  Filled 2021-01-05 (×3): qty 30

## 2021-01-05 MED ORDER — INSULIN ASPART 100 UNIT/ML ~~LOC~~ SOLN
0.0000 [IU] | Freq: Every day | SUBCUTANEOUS | Status: DC
  Administered 2021-01-05: 2 [IU] via SUBCUTANEOUS
  Filled 2021-01-05: qty 0.05

## 2021-01-05 MED ORDER — APIXABAN 5 MG PO TABS
10.0000 mg | ORAL_TABLET | Freq: Two times a day (BID) | ORAL | Status: DC
  Administered 2021-01-05 – 2021-01-07 (×5): 10 mg via ORAL
  Filled 2021-01-05 (×6): qty 2

## 2021-01-05 NOTE — Progress Notes (Signed)
PHARMACY NOTE:  ANTIMICROBIAL RENAL DOSAGE ADJUSTMENT  Current antimicrobial regimen includes a mismatch between antimicrobial dosage and estimated renal function.  As per policy approved by the Pharmacy & Therapeutics and Medical Executive Committees, the antimicrobial dosage will be adjusted accordingly.  Current antimicrobial dosage:  Meropenem 1gm IV q8h  Indication: ESBL infection  Renal Function:  Estimated Creatinine Clearance: 34.4 mL/min (by C-G formula based on SCr of 0.84 mg/dL).     Antimicrobial dosage has been changed to:  Meropenem 1gm IV q12h   Thank you for allowing pharmacy to be a part of this patient's care.  Everette Rank, [facility] 01/05/2021 12:44 AM

## 2021-01-05 NOTE — Progress Notes (Signed)
PROGRESS NOTE  [name] OZH:[id number] DOB: Feb 20, 1929   PCP: Rebecca Aly, FNP  Patient is from: Home.  Lives with daughter.  Uses wheelchair and walker at baseline.  Dependent for most ADL's.  DOA: 01/04/2021 LOS: 0  Chief complaints: Altered mental status  Brief Narrative / Interim history: 87 year old F with PMH of severe dementia, visual and hearing impairment, CVA, severe aortic stenosis, moderate MVR, A. fib on Eliquis, DM-2, HTN, HLD, chronic bladder outlet obstruction with large diverticulum s/p cystoscopy and stent placement on 11/26/2020, debility and recurrent hospitalization from 2/25-3/6 for AME in the setting of "ESBL UTI" for which she was treated with meropenem, returning with decline in mental status and "foul-smelling urine", and admitted for AME in the setting of possible recurrent UTI.  She was initially seen at [facility] in [place] and found to have RLE DVT the morning of admission.  UA with large LE, moderate Hgb and >50 RBC but no nitrite or bacteria.  CTH, CMP and CBC without significant finding.  Lactic acid within normal.  COVID-19 PCR, urine and blood cultures obtained.  Started on IV meropenem and admitted.  Subjective: Seen and examined earlier this morning.  No major events overnight or this morning.  She is a sleepy but awakes to voice.  She is completely disoriented.  Does not follows command.  Does not appear to be in distress.  Objective: Vitals:   01/05/21 0200 01/05/21 0528 01/05/21 0956 01/05/21 1314  BP: (!) 148/64 (!) 145/74 139/63 (!) 148/87  Pulse: (!) 59 75 (!) 52 66  Resp: 18 17 16 14   Temp: 98 F (36.7 C) 97.7 F (36.5 C) 98.4 F (36.9 C) 97.9 F (36.6 C)  TempSrc: Oral Oral Oral Oral  SpO2: 99% 96% 99% 100%  Weight:      Height:        Intake/Output Summary (Last 24 hours) at 01/05/2021 1502 Last data filed at 01/05/2021 1300 Gross per 24 hour  Intake 100.17 ml  Output 400 ml  Net -299.83 ml   Filed Weights    01/04/21 1804  Weight: 48 kg    Examination:  GENERAL: Frail looking elderly female.  No distress. HEENT: MMM.  Vision and hearing grossly intact.  NECK: Supple.  No apparent JVD.  RESP: On RA.  No IWOB.  Fair aeration bilaterally. CVS:  RRR.  3/6 SEM mainly over RUSB and LUSB ABD/GI/GU: BS+. Abd soft, NTND.  MSK/EXT:  Moves extremities. No apparent deformity.  RLE swelling SKIN: no apparent skin lesion or wound NEURO: Sleepy but wakes to voice.  Not oriented.  Does not follows command.  PERRL.  Patellar reflex symmetric.  No facial asymmetry.  No apparent focal neuro deficit but limited exam due to mental status. PSYCH: Calm.  No distress or agitation.  Procedures:  None  Microbiology summarized: [id number] PCR pending. Urine culture pending. Blood cultures pending.  Assessment & Plan: Acute metabolic encephalopathy/possible delirium/dementia-unclear etiology.  Not on sedating medications.  Urinalysis with large LE, moderate Hgb and 50 RBC's but no nitrite or bacteria.  CMP, CBC with differential and CTH without acute finding.  No focal neuro deficit but limited exam.  She might have been dehydrated.  She has visual and hearing impairments.  -Basic encephalopathy work-up-ammonia, TSH, B12 and RPR -Reorientation and delirium precautions -Continue IV meropenem pending urine cultures and blood cultures -Continue home Aricept when able to take p.o. -Continue IV fluid -PT/OT/SLP eval  Pyuria: Unclear if she had UTI.  UA as above.  Reportedly has history of ESBL based on urine culture at PCP office earlier this year but subsequent urine cultures has been negative here. Also history of bladder outlet obstruction with diverticulum status post stent.  -Continue IV meropenem pending cultures. -Will consult urology about her stent if urine culture positive -Daughter wondering why her mother is not on prophylactic antibiotic.  Acute right lower extremity DVT-noted on [place] on 3/18 -Started  on starter pack Eliquis  Chronic A. fib: Rate controlled. -Continue home meds  Chronic diastolic CHF Severe AS/moderate MVR -TTE on 2/4 with LVEF of 65 to 70%, sev LVH, G2DD, sev LAE, sev AS, mod pericardial effusion, mod MVR and nl RVSP -appears hypovolemic.  On p.o. Lasix at home. -Cardiology recommended outpatient evaluation for TAVR last hospitalization earlier this month -Hold home Lasix -Monitor fluid status  Uncontrolled IDDM-2 with hyperglycemia and hyperlipidemia: A1c 7.5% on 2/4 No results for input(s): HGBA1C in the last 72 hours. Recent Labs  Lab 01/04/21 2029 01/05/21 0104 01/05/21 0734 01/05/21 1208  GLUCAP 262* 194* 174* 144*  -Continue sliding scale insulin and adjust as appropriate  Borderline hypercalcemia: Ca 10.2 (corrects to 11.2 fior hypoalbuminemia).  Likely due to dehydration.  Improved. -Continue IV fluid -Follow-up PTH and ionized calcium level  History of CVA: No focal neuro deficit on exam but limited exam.  CTH without acute finding. -Continue home meds when able  Debility/physical deconditioning: Walker and wheelchair dependent at baseline.  Was able to ambulate about "34 feet" before previous hospitalization. -PT/OT eval  Goal of care: Patient with multiple comorbidities and recurrent hospitalization.  She is legally blind with impaired hearing.  Was seen by PMT on 3/4.  She is limited code with no CPR. She is wheelchair and walker dependent.  She is also dependent for most ADLs.  She lives with her daughter, Rebecca Pruitt who is frustrated with her recurrent hospitalization for "UTI with super bug". She is also worried about her aunt who is hospitalized in [place]. Daughter says, I want my mother to have quality life even if it is for one month".  She says " her dementia is not as severe as everybody thinks". She is willing to talk to palliative medicine and agreed to palliative care consult. -Palliative care consulted  Underweight Body mass index  is 18.16 kg/m.  -Consult dietitian     DVT prophylaxis:   apixaban (ELIQUIS) tablet 10 mg  apixaban (ELIQUIS) tablet 5 mg  Code Status: Partial code with no CPR Family Communication: Updated patient's daughter at bedside Level of care: Med-Surg Status is: Inpatient  Remains inpatient appropriate because:Altered mental status, Unsafe d/c plan, IV treatments appropriate due to intensity of illness or inability to take PO and Inpatient level of care appropriate due to severity of illness   Dispo: The patient is from: Home              Anticipated d/c is to: Home              Patient currently is not medically stable to d/c.   Difficult to place patient No       Consultants:  Palliative medicine   Sch Meds:  Scheduled Meds: . apixaban  10 mg Oral BID   Followed by  . [START ON 01/12/2021] apixaban  5 mg Oral BID  . atorvastatin  40 mg Oral Daily  . donepezil  10 mg Oral QHS  . insulin aspart  0-5 Units Subcutaneous QHS  . insulin aspart  0-9 Units Subcutaneous  TID WC  . lactulose  20 g Oral Daily   Continuous Infusions: . lactated ringers 75 mL/hr at 01/05/21 1057  . meropenem (MERREM) IV 1 g (01/05/21 1220)   PRN Meds:.acetaminophen **OR** acetaminophen, albuterol, ondansetron **OR** ondansetron (ZOFRAN) IV  Antimicrobials: Anti-infectives (From admission, onward)   Start     Dose/Rate Route Frequency Ordered Stop   01/05/21 0100  meropenem (MERREM) 1 g in sodium chloride 0.9 % 100 mL IVPB        1 g 200 mL/hr over 30 Minutes Intravenous Every 12 hours 01/05/21 0044     01/05/21 0045  meropenem (MERREM) 1 g in sodium chloride 0.9 % 100 mL IVPB  Status:  Discontinued        1 g 200 mL/hr over 30 Minutes Intravenous Every 8 hours 01/05/21 0039 01/05/21 0044   01/04/21 2315  cefTRIAXone (ROCEPHIN) 1 g in sodium chloride 0.9 % 100 mL IVPB        1 g 200 mL/hr over 30 Minutes Intravenous  Once 01/04/21 2312 01/04/21 2358       I have personally reviewed the  following labs and images: CBC: Recent Labs  Lab 01/04/21 2127 01/05/21 0339  WBC 8.3 6.2  NEUTROABS 5.2  --   HGB 11.2* 10.3*  HCT 35.6* 34.1*  MCV 87.0 89.3  PLT 387 401*   BMP &GFR Recent Labs  Lab 01/04/21 2127 01/05/21 0339  NA 139 143  K 4.8 3.5  CL 105 109  CO2 25 25  GLUCOSE 262* 180*  BUN 26* 22  CREATININE 0.84 0.68  CALCIUM 10.2 9.9   Estimated Creatinine Clearance: 36.1 mL/min (by C-G formula based on SCr of 0.68 mg/dL). Liver & Pancreas: Recent Labs  Lab 01/04/21 2127 01/05/21 0339  AST 28 14*  ALT 14 9  ALKPHOS 37* 35*  BILITOT 1.1 0.5  PROT 6.7 6.5  ALBUMIN 2.8* 2.8*   No results for input(s): LIPASE, AMYLASE in the last 168 hours. No results for input(s): AMMONIA in the last 168 hours. Diabetic: No results for input(s): HGBA1C in the last 72 hours. Recent Labs  Lab 01/04/21 2029 01/05/21 0104 01/05/21 0734 01/05/21 1208  GLUCAP 262* 194* 174* 144*   Cardiac Enzymes: No results for input(s): CKTOTAL, CKMB, CKMBINDEX, TROPONINI in the last 168 hours. No results for input(s): PROBNP in the last 8760 hours. Coagulation Profile: No results for input(s): INR, PROTIME in the last 168 hours. Thyroid Function Tests: No results for input(s): TSH, T4TOTAL, FREET4, T3FREE, THYROIDAB in the last 72 hours. Lipid Profile: No results for input(s): CHOL, HDL, LDLCALC, TRIG, CHOLHDL, LDLDIRECT in the last 72 hours. Anemia Panel: No results for input(s): VITAMINB12, FOLATE, FERRITIN, TIBC, IRON, RETICCTPCT in the last 72 hours. Urine analysis:    Component Value Date/Time   COLORURINE YELLOW 01/04/2021 2042   APPEARANCEUR TURBID (A) 01/04/2021 2042   LABSPEC 1.012 01/04/2021 2042   PHURINE 6.0 01/04/2021 2042   GLUCOSEU 150 (A) 01/04/2021 2042   HGBUR MODERATE (A) 01/04/2021 2042   BILIRUBINUR NEGATIVE 01/04/2021 2042   KETONESUR NEGATIVE 01/04/2021 2042   PROTEINUR 100 (A) 01/04/2021 2042   UROBILINOGEN 0.2 05/26/2008 0817   NITRITE NEGATIVE  01/04/2021 2042   LEUKOCYTESUR LARGE (A) 01/04/2021 2042   Sepsis Labs: Invalid input(s): PROCALCITONIN, [place]  Microbiology: Recent Results (from the past 240 hour(s))  Blood culture (routine x 2)     Status: None (Preliminary result)   Collection Time: 01/04/21  9:49 PM   Specimen: BLOOD  Result Value Ref Range Status   Specimen Description   Final    BLOOD Performed at [facility] [address], [place], [place] [address]    Special Requests   Final    BOTTLES DRAWN AEROBIC AND ANAEROBIC Blood Culture results may not be optimal due to an inadequate volume of blood received in culture bottles Performed at [facility] [address] [place], [place] [address]    Culture   Final    NO GROWTH < 12 HOURS Performed at [facility] [address] [place], [place] [address]    Report Status PENDING  Incomplete  Blood culture (routine x 2)     Status: None (Preliminary result)   Collection Time: 01/05/21  3:39 AM   Specimen: BLOOD  Result Value Ref Range Status   Specimen Description   Final    BLOOD LEFT ANTECUBITAL Performed at [facility] [address], [place], [place] [address]    Special Requests   Final    BOTTLES DRAWN AEROBIC ONLY Blood Culture adequate volume Performed at [facility] [address] [place], [place] [address]    Culture   Final    NO GROWTH <12 HOURS Performed at [facility] [address] [place], [place] [address]    Report Status PENDING  Incomplete    Radiology Studies: CT Head Wo Contrast  Result Date: 01/04/2021 CLINICAL DATA:  Mental status changes EXAM: CT HEAD WITHOUT CONTRAST TECHNIQUE: Contiguous axial images were obtained from the base of the skull through the vertex without intravenous contrast. COMPARISON:  12/16/2020 FINDINGS: Brain: There is atrophy and chronic small vessel disease changes. No acute intracranial abnormality.  Specifically, no hemorrhage, hydrocephalus, mass lesion, acute infarction, or significant intracranial injury. Vascular: No hyperdense vessel or unexpected calcification. Skull: No acute calvarial abnormality. Sinuses/Orbits: No acute findings Other: None IMPRESSION: Atrophy, chronic microvascular disease. No acute intracranial abnormality. Electronically Signed   By: Rolm Baptise M.D.   On: 01/04/2021 23:33      Taye T. [place]  If 7PM-7AM, please contact night-coverage [email or web address] 01/05/2021, 3:02 PM

## 2021-01-05 NOTE — H&P (Signed)
History and Physical  Patient Name: Rebecca Pruitt     ZDG:[id number]    DOB: 1929-10-19    DOA: 01/04/2021 PCP: Vicenta Aly, [facility]  Patient coming from: Home  Chief Complaint: Encephalopathy   HPI: Rebecca Pruitt is a 87 y.o. female with medical history significant for recurrent UTI, chronic bladder outlet obstruction with large diverticulum s/p stent placement on 2/7,chronic atrial fibrillation , severe aortic stenosis/moderate mitral regurgitation, HTN, CVA, DM, HLD who was brought in by family for altered mental status on 01/04/2021.  At the time my exam, patient is encephalopathic, thus a complete and thorough HPI review of systems unable to be obtained.  Patient's daughter bedside able to provide some history.   Daughter states for the past few days patient has had a decline in mental status.  She is not as alert and talkative is normal.  She believes her urine is more foul-smelling.  Patient has had similar episodes of encephalopathy and concern for UTIs in the recent past.  She was recently hospitalized at the beginning of this month for similar issues.  Of note patient was evaluated at outside facility earlier on the day of presentation due to some swelling in her right lower extremity and difficulty with pain.  Ultrasound demonstrated acute right lower extremity DVT.  Patient was previously on Eliquis 2.5 mg twice daily for chronic A. fib.  Last antibiotics per daughter was during her most recent hospitalization.  Prior to this episode of encephalopathy, she had been eating and drinking normally.  Does not note any nausea, vomiting, diarrhea, or dyspnea.    ED course: -Vitals on arrival: Afebrile, heart rate 77, blood pressure 136/66, maintaining sats on room air -Labs on initial presentation: Sodium 139, potassium 4.8, bicarb 25, glucose 262, BUN 26, creatinine 0.84, hemoglobin 11.2, WBC 8.3. UA on admission: Leukocytes large, WBC >50, no bacteria -CT of the head showed no  acute processes -Patient was given Rocephin and the hospitalist service was contacted for further evaluation and management     ROS: Unable to obtain due to patient status    Past Medical History:  Diagnosis Date  . Cancer ([facility])    skin  . Coronary artery disease   . Diabetes mellitus   . Diabetic retinopathy   . Dyslipidemia   . Family history of adverse reaction to anesthesia    pts daughter vomits and has very difficult time to awaken  . H/O: CVA (cardiovascular accident)   . Hearing loss   . Heart murmur   . History of kidney stones   . Hypertension   . Stroke [facility]) 03/2020   x5    Past Surgical History:  Procedure Laterality Date  . CESAREAN SECTION    . CYSTOSCOPY WITH RETROGRADE PYELOGRAM, URETEROSCOPY AND STENT PLACEMENT Right 11/26/2020   Procedure: CYSTOSCOPY WITH RIGHT RETROGRADE PYELOGRAM, RIGHT URETEROSCOPY AND PYELOSCOPY, RIGHT STENT PLACEMENT, FULGERATION OF RIGHT URETER AND BLADDER;  Surgeon: Remi Haggard, MD;  Location: [facility];  Service: Urology;  Laterality: Right;  . DILATION AND CURETTAGE OF UTERUS    . EYE SURGERY Bilateral   . hysterectomy - unknown type    . MASTOIDECTOMY     as a child    Social History: Patient lives at home.  The patient walks with assistance.  Never smoker.  Allergies  Allergen Reactions  . Codeine     Syncope     Family history: family history includes Prostate cancer in an other family member.  Prior to Admission medications  Medication Sig Start Date End Date Taking? Authorizing Provider  apixaban (ELIQUIS) 2.5 MG TABS tablet Take 1 tablet (2.5 mg total) by mouth 2 (two) times daily. 12/20/20 03/20/21 Yes Hall, Carole N, DO  atorvastatin (LIPITOR) 40 MG tablet Take 1 tablet (40 mg total) by mouth daily. 12/21/20 03/21/21 Yes Hall, Carole N, DO  CRANBERRY PO Take 1 tablet by mouth 2 (two) times daily.   Yes [provider]  donepezil (ARICEPT) 10 MG tablet Take 10 mg by mouth at bedtime.   Yes [provider]  fenofibrate 160 MG tablet Take 160 mg by mouth at bedtime.   Yes [provider]  furosemide (LASIX) 20 MG tablet Take 1 tablet (20 mg total) by mouth daily as needed for fluid or edema (swelling, weight gain >2 lbs). 12/21/20  Yes Hall, Carole N, DO  insulin NPH-insulin regular (NOVOLIN 70/30) (70-30) 100 UNIT/ML injection Inject 1-10 Units into the skin 2 (two) times daily as needed (high blood sugar). If blood sugar is over 150 takes 1 unit ; sliding scale   Yes [provider]  lactulose (CHRONULAC) 10 GM/15ML solution Take 30 mLs (20 g total) by mouth daily. 12/23/20  Yes Nita Sells, MD  melatonin 3 MG TABS tablet Take 1 tablet (3 mg total) by mouth at bedtime as needed for up to 20 days. Patient taking differently: Take 3 mg by mouth daily. 12/20/20 01/09/21 Yes Kayleen Memos, DO  mupirocin ointment (BACTROBAN) 2 % Apply 1 application topically daily as needed (cuts).   Yes [provider]  OXYGEN Inhale 2 L into the lungs at bedtime.   Yes [provider]  CONTOUR NEXT TEST test strip 1 each 3 (three) times daily. 12/05/20   [provider]  Insulin Pen Needle (B-D ULTRAFINE III SHORT PEN) 31G X 8 MM MISC See admin instructions. 12/13/20   [provider]       Physical Exam: BP (!) 142/54   Pulse (!) 58   Temp (!) 97.5 F (36.4 C) (Rectal)   Resp 17   Ht 5\' 4"  (1.626 m)   Wt 48 kg   SpO2 98%   BMI 18.16 kg/m  Eyes: PERRL, slight clouding of the conjunctiva ENMT: Mucous membranes are moist.  Neck: normal, supple Respiratory: clear to auscultation bilaterally, no wheezing, no crackles. Normal respiratory effort. No accessory muscle use.  Cardiovascular:  Daily irregular, no murmurs / rubs / gallops. No extremity edema. 2+ pedal pulses.  Abdomen: no tenderness, no masses palpated. . Bowel sounds positive.  Musculoskeletal: no clubbing / cyanosis. No joint deformity upper and lower extremities.  Skin:  Mild  hematoma on the right lower leg, no open wound Neurologic: Alert but oriented only to self. Able to move all extremities on command and answer some questions but somnolent  Psychiatric: Alert and oriented only to self      Labs on Admission:  I have personally reviewed following labs and imaging studies: CBC: Recent Labs  Lab 01/04/21 2127  WBC 8.3  NEUTROABS 5.2  HGB 11.2*  HCT 35.6*  MCV 87.0  PLT 665   Basic Metabolic Panel: Recent Labs  Lab 01/04/21 2127  NA 139  K 4.8  CL 105  CO2 25  GLUCOSE 262*  BUN 26*  CREATININE 0.84  CALCIUM 10.2   GFR: Estimated Creatinine Clearance: 34.4 mL/min (by C-G formula based on SCr of 0.84 mg/dL).  Liver Function Tests: Recent Labs  Lab 01/04/21 2127  AST 28  ALT 14  ALKPHOS 37*  BILITOT 1.1  PROT 6.7  ALBUMIN 2.8*   No results for input(s): LIPASE, AMYLASE in the last 168 hours. No results for input(s): AMMONIA in the last 168 hours. Coagulation Profile: No results for input(s): INR, PROTIME in the last 168 hours. Cardiac Enzymes: No results for input(s): CKTOTAL, CKMB, CKMBINDEX, TROPONINI in the last 168 hours. BNP (last 3 results) No results for input(s): PROBNP in the last 8760 hours. HbA1C: No results for input(s): HGBA1C in the last 72 hours. CBG: Recent Labs  Lab 01/04/21 2029 01/05/21 0104  GLUCAP 262* 194*   Lipid Profile: No results for input(s): CHOL, HDL, LDLCALC, TRIG, CHOLHDL, LDLDIRECT in the last 72 hours. Thyroid Function Tests: No results for input(s): TSH, T4TOTAL, FREET4, T3FREE, THYROIDAB in the last 72 hours. Anemia Panel: No results for input(s): VITAMINB12, FOLATE, FERRITIN, TIBC, IRON, RETICCTPCT in the last 72 hours.   No results found for this or any previous visit (from the past 240 hour(s)).         Radiological Exams on Admission: Personally reviewed, CT head showed no acute processes CT Head Wo Contrast  Result Date: 01/04/2021 CLINICAL DATA:  Mental status changes  EXAM: CT HEAD WITHOUT CONTRAST TECHNIQUE: Contiguous axial images were obtained from the base of the skull through the vertex without intravenous contrast. COMPARISON:  12/16/2020 FINDINGS: Brain: There is atrophy and chronic small vessel disease changes. No acute intracranial abnormality. Specifically, no hemorrhage, hydrocephalus, mass lesion, acute infarction, or significant intracranial injury. Vascular: No hyperdense vessel or unexpected calcification. Skull: No acute calvarial abnormality. Sinuses/Orbits: No acute findings Other: None IMPRESSION: Atrophy, chronic microvascular disease. No acute intracranial abnormality. Electronically Signed   By: Rolm Baptise M.D.   On: 01/04/2021 23:33         Assessment/Plan   1.  Metabolic encephalopathy -Possibly related to UTI, worsening delirium -CT head showed no acute processes -Fall and aspiration precautions -Antibiotics for possible UTI as below  2.  Possible UTI -UA on admission: Leukocytes large, WBC >50, no bacteria -Urine and blood cultures ordered -Started on Merrem given history of ESBL  3.  Acute right lower extremity DVT -Ultrasound of right lower extremity on 01/04/2021 demonstrated right lower extremity DVT -Increased Eliquis to 10 mg BID x7 days given acute DVT.  Patient was on 2.5 mg twice daily for chronic A. fib  4.  Chronic A. fib -Rate controlled -Full dose anticoagulation given DVT  5.  Type 2 diabetes -HbA1C 7.5 on 11/23/20 -Sliding scale  6.  Borderline hypercalcemia -Calcium borderline high and albumin only 2.5.  We will get ionized calcium, PTH, vitamin D to assess further.      DVT prophylaxis: Full dose of Eliquis due to DVT Code Status: Partial Family Communication: Daughter bedside Disposition Plan: Anticipate discharge home with family Consults called: None Admission status: Inpatient    Medical decision making: Patient seen at 2:31 AM on 01/05/2021.  The patient was discussed with ER provider.   What exists of the patient's chart was reviewed in depth and summarized above.  Clinical condition: Fair.        Doran Heater [facility] Please page though [facility] or Epic secure chat:  For password, contact charge nurse

## 2021-01-05 NOTE — ED Notes (Signed)
ED TO INPATIENT HANDOFF REPORT  Name/Age/Gender [facility] 87 y.o. female  Code Status    Code Status Orders  (From admission, onward)         Start     Ordered   01/05/21 0034  Limited resuscitation (code)  Continuous       Question Answer Comment  In the event of cardiac or respiratory ARREST: Initiate Code Blue, Call Rapid Response Yes   In the event of cardiac or respiratory ARREST: Perform CPR No   In the event of cardiac or respiratory ARREST: Perform Intubation/Mechanical Ventilation Yes   In the event of cardiac or respiratory ARREST: Use NIPPV/BiPAp only if indicated Yes   In the event of cardiac or respiratory ARREST: Administer ACLS medications if indicated Yes   In the event of cardiac or respiratory ARREST: Perform Defibrillation or Cardioversion if indicated Yes      01/05/21 0039        Code Status History    Date Active Date Inactive Code Status Order ID Comments User Context   12/14/2020 2258 12/23/2020 2209 Partial Code [id number]  Orene Desanctis, DO Inpatient   12/14/2020 2024 12/14/2020 2258 Full Code [id number]  Orene Desanctis, DO ED   11/22/2020 2313 11/30/2020 2203 Partial Code [id number]  Toy Baker, MD ED   11/22/2020 1937 11/22/2020 2313 Partial Code [id number]  Toy Baker, MD ED   11/22/2020 1847 11/22/2020 1937 DNR [id number]  Dorie Rank, MD ED   Advance Care Planning Activity    Advance Directive Documentation   Flowsheet Row Most Recent Value  Type of Advance Directive Healthcare Power of Attorney, Living will  Pre-existing out of facility DNR order (yellow form or pink MOST form) --  "MOST" Form in Place? --      Home/SNF/Other Home  Chief Complaint Metabolic encephalopathy [E99.37]  Level of Care/Admitting Diagnosis ED Disposition    ED Disposition Condition [place]: [facility] [[id number]]  Level of Care: Med-Surg [16]  May admit patient to [facility] or [facility] if equivalent level of  care is available:: Yes  Covid Evaluation: Asymptomatic Screening Protocol (No Symptoms)  Diagnosis: Metabolic encephalopathy [169.67.ICD-9-CM]  Admitting Physician: Doran Heater [[id number]]  Attending Physician: Doran Heater [[id number]]  Estimated length of stay: 3 - 4 days  Certification:: I certify this patient will need inpatient services for at least 2 midnights       Medical History Past Medical History:  Diagnosis Date  . Cancer ([place])    skin  . Coronary artery disease   . Diabetes mellitus   . Diabetic retinopathy   . Dyslipidemia   . Family history of adverse reaction to anesthesia    pts daughter vomits and has very difficult time to awaken  . H/O: CVA (cardiovascular accident)   . Hearing loss   . Heart murmur   . History of kidney stones   . Hypertension   . Stroke ([place]) 03/2020   x5    Allergies Allergies  Allergen Reactions  . Codeine     Syncope     IV Location/Drains/Wounds Patient Lines/Drains/Airways Status    Active Line/Drains/Airways    Name Placement date Placement time Site Days   Peripheral IV 01/04/21 Right;Posterior Forearm 01/04/21  2145  Forearm  1   Ureteral Drain/Stent Right ureter 6 Fr. 11/26/20  1413  Right ureter  40   Pressure Injury 11/23/20 Coccyx Mid Stage 1 -  Intact skin with  non-blanchable redness of a localized area usually over a bony prominence. pink/non blanchable 11/23/20  1640  -- 43          Labs/Imaging Results for orders placed or performed during the hospital encounter of 01/04/21 (from the past 48 hour(s))  POC CBG, ED     Status: Abnormal   Collection Time: 01/04/21  8:29 PM  Result Value Ref Range   Glucose-Capillary 262 (H) 70 - 99 mg/dL    Comment: Glucose reference range applies only to samples taken after fasting for at least 8 hours.   Comment 1 Notify RN   Urinalysis, Routine w reflex microscopic     Status: Abnormal   Collection Time: 01/04/21  8:42 PM  Result Value Ref Range   Color,  Urine YELLOW YELLOW   APPearance TURBID (A) CLEAR   Specific Gravity, Urine 1.012 1.005 - 1.030   pH 6.0 5.0 - 8.0   Glucose, UA 150 (A) NEGATIVE mg/dL   Hgb urine dipstick MODERATE (A) NEGATIVE   Bilirubin Urine NEGATIVE NEGATIVE   Ketones, ur NEGATIVE NEGATIVE mg/dL   Protein, ur 100 (A) NEGATIVE mg/dL   Nitrite NEGATIVE NEGATIVE   Leukocytes,Ua LARGE (A) NEGATIVE   RBC / HPF >50 (H) 0 - 5 RBC/hpf   WBC, UA >50 (H) 0 - 5 WBC/hpf   Bacteria, UA NONE SEEN NONE SEEN   WBC Clumps PRESENT    Non Squamous Epithelial 6-10 (A) NONE SEEN    Comment: Performed at [facility], [place] [address], [place], [place] [address]  CBC with Differential/Platelet     Status: Abnormal   Collection Time: 01/04/21  9:27 PM  Result Value Ref Range   WBC 8.3 4.0 - 10.5 K/uL   RBC 4.09 3.87 - 5.11 MIL/uL   Hemoglobin 11.2 (L) 12.0 - 15.0 g/dL   HCT 35.6 (L) 36.0 - 46.0 %   MCV 87.0 80.0 - 100.0 fL   MCH 27.4 26.0 - 34.0 pg   MCHC 31.5 30.0 - 36.0 g/dL   RDW 15.1 11.5 - 15.5 %   Platelets 387 150 - 400 K/uL   nRBC 0.0 0.0 - 0.2 %   Neutrophils Relative % 61 %   Neutro Abs 5.2 1.7 - 7.7 K/uL   Lymphocytes Relative 19 %   Lymphs Abs 1.5 0.7 - 4.0 K/uL   Monocytes Relative 15 %   Monocytes Absolute 1.2 (H) 0.1 - 1.0 K/uL   Eosinophils Relative 4 %   Eosinophils Absolute 0.3 0.0 - 0.5 K/uL   Basophils Relative 1 %   Basophils Absolute 0.0 0.0 - 0.1 K/uL   Immature Granulocytes 0 %   Abs Immature Granulocytes 0.03 0.00 - 0.07 K/uL    Comment: Performed at [facility], [place] [address], [place], [place] [address]  Comprehensive metabolic panel     Status: Abnormal   Collection Time: 01/04/21  9:27 PM  Result Value Ref Range   Sodium 139 135 - 145 mmol/L   Potassium 4.8 3.5 - 5.1 mmol/L   Chloride 105 98 - 111 mmol/L   CO2 25 22 - 32 mmol/L   Glucose, Bld 262 (H) 70 - 99 mg/dL    Comment: Glucose reference range applies only to samples taken after fasting for at  least 8 hours.   BUN 26 (H) 8 - 23 mg/dL   Creatinine, Ser 0.84 0.44 - 1.00 mg/dL   Calcium 10.2 8.9 - 10.3 mg/dL   Total Protein 6.7 6.5 -  8.1 g/dL   Albumin 2.8 (L) 3.5 - 5.0 g/dL   AST 28 15 - 41 U/L   ALT 14 0 - 44 U/L   Alkaline Phosphatase 37 (L) 38 - 126 U/L   Total Bilirubin 1.1 0.3 - 1.2 mg/dL   GFR, Estimated >60 >60 mL/min    Comment: (NOTE) Calculated using the CKD-EPI Creatinine Equation (2021)    Anion gap 9 5 - 15    Comment: Performed at [facility], [place] [address], [place], [place] [address]  Lactic acid, plasma     Status: None   Collection Time: 01/04/21 10:00 PM  Result Value Ref Range   Lactic Acid, Venous 1.7 0.5 - 1.9 mmol/L    Comment: Performed at [facility], [place] [address] [place], [place] [address]  CBG monitoring, ED     Status: Abnormal   Collection Time: 01/05/21  1:04 AM  Result Value Ref Range   Glucose-Capillary 194 (H) 70 - 99 mg/dL    Comment: Glucose reference range applies only to samples taken after fasting for at least 8 hours.   CT Head Wo Contrast  Result Date: 01/04/2021 CLINICAL DATA:  Mental status changes EXAM: CT HEAD WITHOUT CONTRAST TECHNIQUE: Contiguous axial images were obtained from the base of the skull through the vertex without intravenous contrast. COMPARISON:  12/16/2020 FINDINGS: Brain: There is atrophy and chronic small vessel disease changes. No acute intracranial abnormality. Specifically, no hemorrhage, hydrocephalus, mass lesion, acute infarction, or significant intracranial injury. Vascular: No hyperdense vessel or unexpected calcification. Skull: No acute calvarial abnormality. Sinuses/Orbits: No acute findings Other: None IMPRESSION: Atrophy, chronic microvascular disease. No acute intracranial abnormality. Electronically Signed   By: Rolm Baptise M.D.   On: 01/04/2021 23:33    Pending Labs Unresulted Labs (From admission, onward)          Start     Ordered   01/05/21 0500   CBC  Tomorrow morning,   R        01/05/21 0039   01/05/21 0500  Comprehensive metabolic panel  Tomorrow morning,   R        01/05/21 0039   01/04/21 2330  SARS CORONAVIRUS 2 (TAT 6-24 HRS) Nasopharyngeal Nasopharyngeal Swab  (Tier 3 - Symptomatic/asymptomatic with Precautions)  Once,   STAT       Question Answer Comment  Is this test for diagnosis or screening Screening   Symptomatic for COVID-19 as defined by CDC No   Hospitalized for COVID-19 No   Admitted to ICU for COVID-19 No   Previously tested for COVID-19 Yes   Resident in a congregate (group) care setting No   Employed in healthcare setting No   Pregnant No   Has patient completed COVID vaccination(s) (2 doses of Pfizer/Moderna 1 dose of [employer]) Yes   Has patient completed COVID Booster / 3rd dose Yes      01/04/21 2330   01/04/21 2005  Blood culture (routine x 2)  BLOOD CULTURE X 2,   STAT      01/04/21 2004   01/04/21 2005  Lactic acid, plasma  Now then every 2 hours,   STAT      01/04/21 2004   01/04/21 1912  CBC with Differential  ONCE - STAT,   STAT        01/04/21 2003   01/04/21 1907  Urine culture  ONCE - STAT,   STAT        01/04/21 2003  Vitals/Pain Today's Vitals   01/04/21 2254 01/04/21 2300 01/04/21 2346 01/05/21 0103  BP: (!) 150/68 (!) 141/66 (!) 132/95 (!) 163/62  Pulse: 71 78 65 62  Resp: 19 12 19 18   Temp:      TempSrc:      SpO2: 97% 100% 99% 100%  Weight:      Height:      PainSc:        Isolation Precautions No active isolations  Medications Medications  acetaminophen (TYLENOL) tablet 650 mg (has no administration in time range)    Or  acetaminophen (TYLENOL) suppository 650 mg (has no administration in time range)  ondansetron (ZOFRAN) tablet 4 mg (has no administration in time range)    Or  ondansetron (ZOFRAN) injection 4 mg (has no administration in time range)  albuterol (PROVENTIL) (2.5 MG/3ML) 0.083% nebulizer solution 2.5 mg (has no administration in  time range)  insulin aspart (novoLOG) injection 0-9 Units (has no administration in time range)  insulin aspart (novoLOG) injection 0-5 Units (0 Units Subcutaneous Not Given 01/05/21 0105)  meropenem (MERREM) 1 g in sodium chloride 0.9 % 100 mL IVPB (1 g Intravenous New Bag/Given 01/05/21 0101)  0.9 %  sodium chloride infusion ( Intravenous Bolus from Bag 01/04/21 2201)  cefTRIAXone (ROCEPHIN) 1 g in sodium chloride 0.9 % 100 mL IVPB (0 g Intravenous Stopped 01/04/21 2358)    Mobility non-ambulatory

## 2021-01-05 NOTE — Progress Notes (Signed)
SBAR reviewed. Questions answered by Lawerance Bach RN.

## 2021-01-05 NOTE — Evaluation (Signed)
Clinical/Bedside Swallow Evaluation Patient Details  Name: Rebecca Pruitt MRN: [id number] Date of Birth: Jul 23, 1929  Today's Date: 01/05/2021 Time: SLP Start Time (ACUTE ONLY): 0086 SLP Stop Time (ACUTE ONLY): 1605 SLP Time Calculation (min) (ACUTE ONLY): 50 min  Past Medical History:  Past Medical History:  Diagnosis Date  . Cancer ([place])    skin  . Coronary artery disease   . Diabetes mellitus   . Diabetic retinopathy   . Dyslipidemia   . Family history of adverse reaction to anesthesia    pts daughter vomits and has very difficult time to awaken  . H/O: CVA (cardiovascular accident)   . Hearing loss   . Heart murmur   . History of kidney stones   . Hypertension   . Stroke [facility]) 03/2020   x5   Past Surgical History:  Past Surgical History:  Procedure Laterality Date  . CESAREAN SECTION    . CYSTOSCOPY WITH RETROGRADE PYELOGRAM, URETEROSCOPY AND STENT PLACEMENT Right 11/26/2020   Procedure: CYSTOSCOPY WITH RIGHT RETROGRADE PYELOGRAM, RIGHT URETEROSCOPY AND PYELOSCOPY, RIGHT STENT PLACEMENT, FULGERATION OF RIGHT URETER AND BLADDER;  Surgeon: Remi Haggard, MD;  Location: [facility];  Service: Urology;  Laterality: Right;  . DILATION AND CURETTAGE OF UTERUS    . EYE SURGERY Bilateral   . hysterectomy - unknown type    . MASTOIDECTOMY     as a child   HPI:  Rebecca Pruitt is a 87 y.o. female with medical history significant for dementia, recurrent UTI, chronic bladder outlet obstruction with large diverticulum s/p stent placement on 2/7,chronic atrial fibrillation , severe aortic stenosis/moderate mitral regurgitation, HTN, CVA, DM, HLD who was brought in by family for altered mental status. MRI negative; CXR 2/2 negative. MBS 2017: Mild oral phase dysphagia;Mild pharyngeal phase dysphagia   Clinical Impression Patient presents with a mild oropharyngeal dysphagia characterized by mild facial weakness and intermittently delayed swallow initiation resulting in flash penetration  of both nectar thick and thin liquids. No frank penetration or aspiration noted.  Head MRI during this admit showed Chronic left pontine and right thalamic and bilateral  corona radiata/centrum semiovale lacunar insults   Assessment / Plan / Recommendation Clinical Impression  Patient presents with symptoms of a minimal oropharyngeal dysphagia.  Pt is very HOH and legally blind but is able to articulate her desire for po.  Daughter has been giving her ice and she has not had any coughing per daughter.  Patient exhibited prolonged mastication but adequate oral clearance with solids (soft cereal bar) and suspected delayed swallow initaition with thin liquids via straw sips.  She demonstrated delayed cough x1 and belching - concerning for potential esophageal retrograde propulsion. Pt's voice was not gurgly but was hoarse.  SlP had pt help hold her own cup and cereal bar with hand over hand assist to maximize neuro input and safety. Advised daughter Cammy to dysphagia mitigation strategies and gustatory changes with dementia and dysphagia.  Recommend dys3/thin diet - All education completed, no follow up needed.  Thanks for this consult. SLP Visit Diagnosis: Dysphagia, unspecified (R13.10)    Aspiration Risk  Mild aspiration risk    Diet Recommendation Dysphagia 1 (Puree);Thin liquid   Medication Administration: Crushed with puree Supervision: Full supervision/cueing for compensatory strategies;Staff to assist with self feeding Compensations: Minimize environmental distractions;Small sips/bites;Slow rate    Other  Recommendations Oral Care Recommendations: Oral care BID;Staff/trained caregiver to provide oral care   Follow up Recommendations  (TBD)      Frequency and Duration  Prognosis        Swallow Study   General Date of Onset: 01/05/21 HPI: Rebecca Pruitt is a 87 y.o. female with medical history significant for dementia, recurrent UTI, chronic bladder outlet obstruction  with large diverticulum s/p stent placement on 2/7,chronic atrial fibrillation , severe aortic stenosis/moderate mitral regurgitation, HTN, CVA, DM, HLD who was brought in by family for altered mental status. MRI negative; CXR 2/2 negative. MBS 2017: Mild oral phase dysphagia;Mild pharyngeal phase dysphagia   Clinical Impression Patient presents with a mild oropharyngeal dysphagia characterized by mild facial weakness and intermittently delayed swallow initiation resulting in flash penetration of both nectar thick and thin liquids. No frank penetration or aspiration noted.  Head MRI during this admit showed Chronic left pontine and right thalamic and bilateral  corona radiata/centrum semiovale lacunar insults Type of Study: Bedside Swallow Evaluation Previous Swallow Assessment: 3 weeks ago bse Diet Prior to this Study: NPO Temperature Spikes Noted: No Respiratory Status: Room air History of Recent Intubation: No Behavior/Cognition: Alert;Cooperative;Pleasant mood;Requires cueing Oral Cavity Assessment: Within Functional Limits Oral Care Completed by SLP: Recent completion by staff Oral Cavity - Dentition: Adequate natural dentition;Dentures, top Vision: Impaired for self-feeding (legally blind) Self-Feeding Abilities: Needs assist;Total assist Patient Positioning: Upright in bed Baseline Vocal Quality: Low vocal intensity Volitional Cough: Weak Volitional Swallow: Able to elicit    Oral/Motor/Sensory Function Overall Oral Motor/Sensory Function: Other (comment) (mild left facial asymmetry)   Ice Chips     Thin Liquid Thin Liquid: Within functional limits Presentation: Straw Pharyngeal  Phase Impairments: Suspected delayed Swallow    Nectar Thick Nectar Thick Liquid: Not tested   Honey Thick Honey Thick Liquid: Not tested   Puree Puree: Impaired Oral Phase Functional Implications: Prolonged oral transit Pharyngeal Phase Impairments: Suspected delayed Swallow   Solid     Solid: Within  functional limits Oral Phase Impairments: Other (comment) (prolonged mastication but pt clears boluses independently) Oral Phase Functional Implications: Prolonged oral transit      Macario Golds 01/05/2021,4:50 PM  Kathleen Lime, MS [place] Office [phone] Pager [phone]

## 2021-01-05 NOTE — Evaluation (Signed)
Physical Therapy One Time Evaluation Patient Details Name: Rebecca Pruitt MRN: [id number] DOB: Sep 22, 1929 Today's Date: 01/05/2021   History of Present Illness  87 y.o. female with medical history significant for recurrent UTI, chronic bladder outlet obstruction with large diverticulum s/p stent placement on 2/7,chronic atrial fibrillation , severe aortic stenosis/moderate mitral regurgitation, HTN, CVA, DM, HLD who was brought in by family for altered mental status on 01/04/2021.  Pt admitted for metabolic encephalopathy and also with acute right LE DVT  Clinical Impression  Patient evaluated by Physical Therapy with no further acute PT needs identified.  Pt with admission 3 weeks ago for similar presentation.  Pt received PT at that time however remained total assist for bed mobility with physical therapy signing off.  Pt with hearing impairment and legally blind per chart review and observation.  No further skilled PT indicated at this time as pt remains total assist.  Family wished for pt to d/c back home upon last admission and equipment listed below was recommended at that time.  PT is signing off. Thank you for this referral.     Follow Up Recommendations Other (comment) (total care, will require increased assist, no f/u PT)    Equipment Recommendations  Hospital bed;Wheelchair (measurements PT);Wheelchair cushion (measurements PT);Other (comment) (hoyer lift)    Recommendations for Other Services       Precautions / Restrictions Precautions Precautions: Fall Precaution Comments: legally blind, HOH in one ear and almost deaf in the other, dementia at baseline Restrictions Weight Bearing Restrictions: No      Mobility  Bed Mobility Overal bed mobility: Needs Assistance Bed Mobility: Supine to Sit;Sit to Supine     Supine to sit: Total assist Sit to supine: Total assist   General bed mobility comments: no verbal or manual cue following or initiation of movement to EOB,  utilized bed pad to assist with trunk control    Transfers                 General transfer comment: deferred  Ambulation/Gait                Stairs            Wheelchair Mobility    Modified Rankin (Stroke Patients Only)       Balance Overall balance assessment: Needs assistance Sitting-balance support: Bilateral upper extremity supported;Feet supported Sitting balance-Leahy Scale: Zero Sitting balance - Comments: pt not able to support trunk; required total assist                                     Pertinent Vitals/Pain Pain Assessment: No/denies pain Faces Pain Scale: No hurt    Home Living Family/patient expects to be discharged to:: Private residence Living Arrangements: Children Available Help at Discharge: Family;Available 24 hours/day             Additional Comments: patient is pleasantly confused, unable to provide info, daughter present who states her mother lives with her and she also has [facility] services. She walks with walker and assistance. -- per admission 3 weeks ago    Prior Function Level of Independence: Needs assistance   Gait / Transfers Assistance Needed: pt reports her DTR walks with her while she uses her rollator "most of the time"     Comments: patient unable to provide history, pleasantly confused. Per chart DTR is patient's caregiver. Patient is legally blind --per admission 3 weeks  ago     Hand Dominance   Dominant Hand: Left    Extremity/Trunk Assessment   Upper Extremity Assessment Upper Extremity Assessment: Generalized weakness RUE Deficits / Details: keeps hands in flexed posture. limited strength to raise arms.    Lower Extremity Assessment Lower Extremity Assessment: Generalized weakness    Cervical / Trunk Assessment Cervical / Trunk Assessment: Kyphotic;Other exceptions Cervical / Trunk Exceptions: keeps head in neck extension at rest - looking up and around room (legally blind)   Communication   Communication: HOH  Cognition Arousal/Alertness: Awake/alert Behavior During Therapy: Flat affect Overall Cognitive Status: Difficult to assess                   Orientation Level: Disoriented to;Place;Time;Situation     Following Commands: Follows one step commands inconsistently;Follows one step commands with increased time     Problem Solving: Slow processing;Decreased initiation;Requires verbal cues;Requires tactile cues General Comments: very difficult to assess cognition as she is mostly deaf and also legally blind- difficult to communicate with. Cannot really clarifiy if lack of cue/command following was cognition or her just not hearing commands.      General Comments      Exercises     Assessment/Plan    PT Assessment Patent does not need any further PT services  PT Problem List         PT Treatment Interventions      PT Goals (Current goals can be found in the Care Plan section)  Acute Rehab PT Goals PT Goal Formulation: All assessment and education complete, DC therapy    Frequency     Barriers to discharge        Co-evaluation               AM-PAC PT "6 Clicks" Mobility  Outcome Measure Help needed turning from your back to your side while in a flat bed without using bedrails?: Total Help needed moving from lying on your back to sitting on the side of a flat bed without using bedrails?: Total Help needed moving to and from a bed to a chair (including a wheelchair)?: Total Help needed standing up from a chair using your arms (e.g., wheelchair or bedside chair)?: Total Help needed to walk in hospital room?: Total Help needed climbing 3-5 steps with a railing? : Total 6 Click Score: 6    End of Session   Activity Tolerance: Patient tolerated treatment well Patient left: in bed;with call bell/phone within reach;with bed alarm set Nurse Communication: Mobility status      Time: 1152-1209 PT Time Calculation (min)  (ACUTE ONLY): 17 min   Charges:   PT Evaluation $PT Eval Moderate Complexity: 1 Mod         Kati PT, DPT Acute Rehabilitation Services Pager: [phone] Office: [phone]  [place] Ram E 01/05/2021, 1:43 PM

## 2021-01-06 DIAGNOSIS — R410 Disorientation, unspecified: Secondary | ICD-10-CM

## 2021-01-06 DIAGNOSIS — Z515 Encounter for palliative care: Secondary | ICD-10-CM

## 2021-01-06 DIAGNOSIS — R531 Weakness: Secondary | ICD-10-CM

## 2021-01-06 LAB — GLUCOSE, CAPILLARY
Glucose-Capillary: 128 mg/dL — ABNORMAL HIGH (ref 70–99)
Glucose-Capillary: 147 mg/dL — ABNORMAL HIGH (ref 70–99)
Glucose-Capillary: 190 mg/dL — ABNORMAL HIGH (ref 70–99)
Glucose-Capillary: 128 mg/dL — ABNORMAL HIGH (ref 70–99)

## 2021-01-06 LAB — URINE CULTURE: Culture: <10000 — AB

## 2021-01-06 LAB — RPR: RPR Ser Ql: NONREACTIVE

## 2021-01-06 LAB — TSH: TSH: 1.533 u[IU]/mL (ref 0.350–4.500)

## 2021-01-06 LAB — AMMONIA: Ammonia: 13 umol/L (ref 9–35)

## 2021-01-06 LAB — VITAMIN B12: Vitamin B-12: 579 pg/mL (ref 180–914)

## 2021-01-06 LAB — PARATHYROID HORMONE, INTACT (NO CA): PTH: 32 pg/mL (ref 15–65)

## 2021-01-06 LAB — CALCIUM, IONIZED: Calcium, Ionized, Serum: 5.7 mg/dL — ABNORMAL HIGH (ref 4.5–5.6)

## 2021-01-06 NOTE — Progress Notes (Addendum)
PROGRESS NOTE  [name] IWL:[id number] DOB: April 08, 1929   PCP: Rebecca Aly, FNP  Patient is from: Home.  Lives with daughter.  Uses wheelchair and walker at baseline.  Dependent for most ADL's.  DOA: 01/04/2021 LOS: 1  Chief complaints: Altered mental status  Brief Narrative / Interim history: 87 year old F with PMH of severe dementia, visual and hearing impairment, CVA, severe aortic stenosis, moderate MVR, A. fib on Eliquis, DM-2, HTN, HLD, chronic bladder outlet obstruction with large diverticulum s/p cystoscopy and stent placement on 11/26/2020, debility and recurrent hospitalization from 2/25-3/6 for AME in the setting of "ESBL UTI" for which she was treated with meropenem, returning with decline in mental status and "foul-smelling urine", and admitted for AME in the setting of possible recurrent UTI.  She was initially seen at [facility] in [place] and found to have RLE DVT the morning of admission.  UA with large LE, moderate Hgb and >50 RBC but no nitrite or bacteria.  CTH, CMP and CBC without significant finding.  Lactic acid within normal.  COVID-19 PCR, urine and blood cultures obtained.  Started on IV meropenem and admitted.  Subjective: Seen and examined earlier this morning.  No major events overnight of this morning.  Patient is sleepy but awakes to voice easily.  She does not converse or respond to questions.  Does not follows command.  Objective: Vitals:   01/05/21 1314 01/05/21 2027 01/06/21 0631 01/06/21 1257  BP: (!) 148/87 (!) 134/58 (!) 153/70 (!) 145/48  Pulse: 66 67 (!) 57 68  Resp: 14 16 20 15   Temp: 97.9 F (36.6 C) 98.3 F (36.8 C) 97.7 F (36.5 C) 97.7 F (36.5 C)  TempSrc: Oral Oral Oral Oral  SpO2: 100% 98% 98% 100%  Weight:      Height:        Intake/Output Summary (Last 24 hours) at 01/06/2021 1434 Last data filed at 01/06/2021 1100 Gross per 24 hour  Intake 725.21 ml  Output 1650 ml  Net -924.79 ml   Filed Weights   01/04/21  1804  Weight: 48 kg    Examination:  GENERAL: Frail looking elderly female. HEENT: MMM.  Dentures.  Impaired vision.  Hearing seems to be intact NECK: Supple.  No apparent JVD.  RESP: On RA.  No IWOB.  Fair aeration bilaterally. CVS:  RRR.  3/6 SEM mainly over RUSB and LUSB ABD/GI/GU: BS+. Abd soft, NTND.  MSK/EXT:  Moves extremities. No apparent deformity.  RLE swelling SKIN: no apparent skin lesion or wound NEURO: Sleepy but wakes to voice.  She does not converse nor follow command.  PERRL.  No facial asymmetry. PSYCH: Calm.  No distress or agitation.  Procedures:  None  Microbiology summarized: RDEYC-14 PCR pending. Urine culture with less than 10,000 colonies Blood cultures NGTD  Assessment & Plan: Acute metabolic encephalopathy/possible delirium/dementia-unclear etiology.  Not on sedating medications.  Urinalysis with large LE, moderate Hgb and 50 RBC's but no nitrite or bacteria.  CMP, CBC with diff, TSH, B12, ammonia, RPR and CTH without acute finding.  No focal neuro deficit but limited exam.  She might have been dehydrated.  She has visual and hearing impairments.  -Reorientation and delirium precautions -Discontinue IV meropenem-cultures negative. -Continue home Aricept -Continue IV fluid -SLP recommended dysphagia 1 diet -PT/OT recommended DME-hospital bed, Hoyer lift, wheelchair, wheelchair cushion  Pyuria: UTI ruled out.  UA as above.  Reportedly has history of ESBL based on urine culture at PCP office earlier this year but subsequent urine cultures  has been negative here.  -IV meropenem 3/18-3/20.  Discontinued after negative cultures  Acute RLE DVT-noted on [place] at [facility] on 3/18 -Started on starter pack Eliquis  Chronic A. fib: Rate controlled. -Continue home meds  Chronic diastolic CHF Severe AS/moderate MVR -TTE on 2/4:LVEF of 65-70%, sevLVH, G2DD, sevLAE, sevAS, mod pericardial effusion, modMVR & nl RVSP -Appears euvolemic.  On p.o. Lasix at  home. -Cardiology recommended outpatient evaluation for TAVR last hospitalization earlier this month -Continue holding home Lasix -Monitor fluid status  Uncontrolled IDDM-2 with hyperglycemia and hyperlipidemia: A1c 7.5% on 2/4 No results for input(s): HGBA1C in the last 72 hours. Recent Labs  Lab 01/05/21 1208 01/05/21 1604 01/05/21 2029 01/06/21 0723 01/06/21 1132  GLUCAP 144* 123* 201* 128* 147*  -Continue sliding scale insulin and adjust as appropriate  Borderline hypercalcemia: Ca 10.2 (corrects to 11.2 for hypoalbuminemia).  Likely due to dehydration.  Improved. -Continue IV fluid -Follow-up PTH and ionized calcium levels  History of CVA: No focal neuro deficit on exam but limited exam.  CTH without acute finding. -Continue home meds when able  Debility/physical deconditioning: Walker and wheelchair dependent at baseline.  Per daughter, was able to ambulate about "34 feet" before previous hospitalization. -Therapy recommended DME  Goal of care: Patient with multiple comorbidities and recurrent hospitalization.  She is legally blind with impaired hearing.  Was seen by PMT on 3/4.  She is limited code with no CPR. She is wheelchair and walker dependent.  She is also dependent for most ADLs.  She lives with her daughter, Rebecca Pruitt who is frustrated with her recurrent hospitalization for "UTI with super bug". She is also worried about her aunt who is hospitalized in [place]. Daughter says, I want my mother to have quality life even if it is for one month".  She says " her dementia is not as severe as everybody thinks". She is willing to talk to palliative medicine and agreed to palliative care consult. -Palliative care consulted  Underweight Body mass index is 18.16 kg/m.  -Consult dietitian     DVT prophylaxis:   apixaban (ELIQUIS) tablet 10 mg  apixaban (ELIQUIS) tablet 5 mg  Code Status: Partial code with no CPR Family Communication: Updated patient's daughter at  bedside on 3/19.  None at bedside Level of care: Med-Surg Status is: Inpatient  Remains inpatient appropriate because:Altered mental status, Unsafe d/c plan, IV treatments appropriate due to intensity of illness or inability to take PO and Inpatient level of care appropriate due to severity of illness   Dispo: The patient is from: Home              Anticipated d/c is to: Home              Patient currently is not medically stable to d/c.   Difficult to place patient No       Consultants:  Palliative medicine   Sch Meds:  Scheduled Meds:  apixaban  10 mg Oral BID   Followed by   Derrill Memo ON 01/12/2021] apixaban  5 mg Oral BID   atorvastatin  40 mg Oral Daily   donepezil  10 mg Oral QHS   insulin aspart  0-5 Units Subcutaneous QHS   insulin aspart  0-9 Units Subcutaneous TID WC   lactulose  20 g Oral Daily   Continuous Infusions:  lactated ringers 75 mL/hr at 01/05/21 1057   PRN Meds:.acetaminophen **OR** acetaminophen, albuterol, ondansetron **OR** ondansetron (ZOFRAN) IV  Antimicrobials: Anti-infectives (From admission, onward)  Start     Dose/Rate Route Frequency Ordered Stop   01/05/21 0100  meropenem (MERREM) 1 g in sodium chloride 0.9 % 100 mL IVPB  Status:  Discontinued        1 g 200 mL/hr over 30 Minutes Intravenous Every 12 hours 01/05/21 0044 01/06/21 1425   01/05/21 0045  meropenem (MERREM) 1 g in sodium chloride 0.9 % 100 mL IVPB  Status:  Discontinued        1 g 200 mL/hr over 30 Minutes Intravenous Every 8 hours 01/05/21 0039 01/05/21 0044   01/04/21 2315  cefTRIAXone (ROCEPHIN) 1 g in sodium chloride 0.9 % 100 mL IVPB        1 g 200 mL/hr over 30 Minutes Intravenous  Once 01/04/21 2312 01/04/21 2358       I have personally reviewed the following labs and images: CBC: Recent Labs  Lab 01/04/21 2127 01/05/21 0339  WBC 8.3 6.2  NEUTROABS 5.2  --   HGB 11.2* 10.3*  HCT 35.6* 34.1*  MCV 87.0 89.3  PLT 387 401*   BMP &GFR Recent Labs   Lab 01/04/21 2127 01/05/21 0339  NA 139 143  K 4.8 3.5  CL 105 109  CO2 25 25  GLUCOSE 262* 180*  BUN 26* 22  CREATININE 0.84 0.68  CALCIUM 10.2 9.9   Estimated Creatinine Clearance: 36.1 mL/min (by C-G formula based on SCr of 0.68 mg/dL). Liver & Pancreas: Recent Labs  Lab 01/04/21 2127 01/05/21 0339  AST 28 14*  ALT 14 9  ALKPHOS 37* 35*  BILITOT 1.1 0.5  PROT 6.7 6.5  ALBUMIN 2.8* 2.8*   No results for input(s): LIPASE, AMYLASE in the last 168 hours. Recent Labs  Lab 01/06/21 0315  AMMONIA 13   Diabetic: No results for input(s): HGBA1C in the last 72 hours. Recent Labs  Lab 01/05/21 1208 01/05/21 1604 01/05/21 2029 01/06/21 0723 01/06/21 1132  GLUCAP 144* 123* 201* 128* 147*   Cardiac Enzymes: No results for input(s): CKTOTAL, CKMB, CKMBINDEX, TROPONINI in the last 168 hours. No results for input(s): PROBNP in the last 8760 hours. Coagulation Profile: No results for input(s): INR, PROTIME in the last 168 hours. Thyroid Function Tests: Recent Labs    01/06/21 0315  TSH 1.533   Lipid Profile: No results for input(s): CHOL, HDL, LDLCALC, TRIG, CHOLHDL, LDLDIRECT in the last 72 hours. Anemia Panel: Recent Labs    01/06/21 0315  VITAMINB12 579   Urine analysis:    Component Value Date/Time   COLORURINE YELLOW 01/04/2021 2042   APPEARANCEUR TURBID (A) 01/04/2021 2042   LABSPEC 1.012 01/04/2021 2042   PHURINE 6.0 01/04/2021 2042   GLUCOSEU 150 (A) 01/04/2021 2042   HGBUR MODERATE (A) 01/04/2021 2042   BILIRUBINUR NEGATIVE 01/04/2021 2042   KETONESUR NEGATIVE 01/04/2021 2042   PROTEINUR 100 (A) 01/04/2021 2042   UROBILINOGEN 0.2 05/26/2008 0817   NITRITE NEGATIVE 01/04/2021 2042   LEUKOCYTESUR LARGE (A) 01/04/2021 2042   Sepsis Labs: Invalid input(s): PROCALCITONIN, [place]  Microbiology: Recent Results (from the past 240 hour(s))  Urine culture     Status: Abnormal   Collection Time: 01/04/21  8:42 PM   Specimen: Urine, Random   Result Value Ref Range Status   Specimen Description   Final    URINE, RANDOM Performed at [facility] [address], [place], [place] [address]    Special Requests   Final    NONE Performed at [facility], [place] [address]  [place], [place] [address]    Culture (A)  Final    <10,000 COLONIES/mL INSIGNIFICANT GROWTH Performed at [facility] [address] [place], [place] [address]    Report Status 01/06/2021 FINAL  Final  Blood culture (routine x 2)     Status: None (Preliminary result)   Collection Time: 01/04/21  9:49 PM   Specimen: BLOOD  Result Value Ref Range Status   Specimen Description   Final    BLOOD Performed at [facility] [address], [place], [place] [address]    Special Requests   Final    BOTTLES DRAWN AEROBIC AND ANAEROBIC Blood Culture results may not be optimal due to an inadequate volume of blood received in culture bottles Performed at [facility] [address], [place], [place] [address]    Culture   Final    NO GROWTH < 12 HOURS Performed at [facility] [address] [place], [place] [address]    Report Status PENDING  Incomplete  SARS CORONAVIRUS 2 (TAT 6-24 HRS) Nasopharyngeal Nasopharyngeal Swab     Status: None   Collection Time: 01/04/21 11:57 PM   Specimen: Nasopharyngeal Swab  Result Value Ref Range Status   SARS Coronavirus 2 NEGATIVE NEGATIVE Final    Comment: (NOTE) SARS-CoV-2 target nucleic acids are NOT DETECTED.  The SARS-CoV-2 RNA is generally detectable in upper and lower respiratory specimens during the acute phase of infection. Negative results do not preclude SARS-CoV-2 infection, do not rule out co-infections with other pathogens, and should not be used as the sole basis for treatment or other patient management decisions. Negative results must be combined with clinical observations, patient history, and epidemiological  information. The expected result is Negative.  Fact Sheet for Patients: SugarRoll.be  Fact Sheet for Healthcare Providers: https://www.woods-mathews.com/  This test is not yet approved or cleared by the [place] FDA and  has been authorized for detection and/or diagnosis of SARS-CoV-2 by FDA under an Emergency Use Authorization (EUA). This EUA will remain  in effect (meaning this test can be used) for the duration of the COVID-19 declaration under Se ction 564(b)(1) of the Act, 21 U.S.C. section 360bbb-3(b)(1), unless the authorization is terminated or revoked sooner.  Performed at [facility], [place] [address], [place], [place] [address]   Blood culture (routine x 2)     Status: None (Preliminary result)   Collection Time: 01/05/21  3:39 AM   Specimen: BLOOD  Result Value Ref Range Status   Specimen Description   Final    BLOOD LEFT ANTECUBITAL Performed at [facility] [address], [place], [place] [address]    Special Requests   Final    BOTTLES DRAWN AEROBIC ONLY Blood Culture adequate volume Performed at [facility] [address] [place], [place] [address]    Culture   Final    NO GROWTH <12 HOURS Performed at [facility] [address], [place], [place] [address]    Report Status PENDING  Incomplete    Radiology Studies: No results found.    Peta Peachey T. [place]  If 7PM-7AM, please contact night-coverage [email or web address] 01/06/2021, 2:34 PM

## 2021-01-06 NOTE — Evaluation (Signed)
Occupational Therapy Evaluation Patient Details Name: Rebecca Pruitt MRN: [id number] DOB: Aug 07, 1929 Today's Date: 01/06/2021    History of Present Illness 87 y.o. female with medical history significant for recurrent UTI, chronic bladder outlet obstruction with large diverticulum s/p stent placement on 2/7,chronic atrial fibrillation , severe aortic stenosis/moderate mitral regurgitation, HTN, CVA, DM, HLD who was brought in by family for altered mental status on 01/04/2021.  Pt admitted for metabolic encephalopathy and also with acute right LE DVT   Clinical Impression   Patient evaluated by Occupational Therapy and found to be in current need of Total care with all functional mobility and ADLs.  Baseline is difficult to determine and no family present for OT assessment, however pt was evaluated by OT in February 2022, and found to have UE contractures and require Total Assist for all ADLs during that Evaluation as well, however there is a report in PT note from family that pt was ambulating with her rollator 3 weeks ago.  May need to speak with family regarding a true baseline to better determine any possible rehab potential.  At this time, unfortunately, pt assessed with poor rehab potential for skilled occupational therapy services and OT is signing off. Thank you for this referral.      Follow Up Recommendations  Supervision/Assistance - 24 hour  Vs SNF if family cannot safely meet pt's current total care needs.    Equipment Recommendations  Wheelchair (measurements OT) (HOyer type mechanical lift, Hospital Bed. Due to head-drop would benefit from tilt-in-space wheelchair.)    Recommendations for Other Services       Precautions / Restrictions Precautions Precautions: Fall Precaution Comments: legally blind, HOH in one ear and almost deaf in the other, dementia at baseline      Mobility Bed Mobility Overal bed mobility: Needs Assistance             General bed mobility  comments: Supine scoot to [facility]: Total Assist of 2 people.    Transfers                 General transfer comment: deferred to focus on ADLs    Balance       Sitting balance - Comments: Poor ability to sit midline in "chair" position in bed. Leans to the RT and requires sevearl pillows to bollster for feeding.       Standing balance comment: unable to assess                           ADL either performed or assessed with clinical judgement   ADL Overall ADL's : Needs assistance/impaired Eating/Feeding: Total assistance;Bed level   Grooming: Total assistance;Bed level   Upper Body Bathing: Total assistance;Bed level   Lower Body Bathing: Bed level;Total assistance   Upper Body Dressing : Total assistance;Bed level   Lower Body Dressing: Total assistance;Bed level     Toilet Transfer Details (indicate cue type and reason): Currently would require [facility] lift. Toileting- Clothing Manipulation and Hygiene: Total assistance;Bed level Toileting - Clothing Manipulation Details (indicate cue type and reason): On pure wick.  Baseline continence status unknown.   Tub/Shower Transfer Details (indicate cue type and reason): NT Functional mobility during ADLs: Total assistance       Vision Baseline Vision/History: Legally blind Patient Visual Report: No change from baseline       Perception     Praxis      Pertinent Vitals/Pain Pain Assessment: No/denies pain  Hand Dominance Left (LT hand contracted.)   Extremity/Trunk Assessment Upper Extremity Assessment Upper Extremity Assessment: RUE deficits/detail;LUE deficits/detail RUE Deficits / Details: PROM only. No AROM. Shoulder to bony end feel at ~90 degrees, elbow stops at 90 degrees, then requires slow stretch to reach ~20 degrees. Hands unable to make composite grip or pinch with digit contractures. RUE Coordination: decreased fine motor;decreased gross motor LUE Deficits / Details: Flexor tone and  contarctures from shulder to hand. Slow stretch to lift finger tips from palm. Able to insert rolled washcloth to protect palmer skin. Wrist/elbow flexion contractures. LUE Coordination: decreased fine motor;decreased gross motor       Cervical / Trunk Assessment Cervical / Trunk Assessment: Kyphotic Cervical / Trunk Exceptions: Can lift head ocassionally with cues or of own volition, turns head to sun coming in window. Otherwise with head drop and weak neck extensors.   Communication Communication Communication: HOH   Cognition Arousal/Alertness: Awake/alert Behavior During Therapy: Flat affect Overall Cognitive Status: No family/caregiver present to determine baseline cognitive functioning Area of Impairment: Orientation;Awareness;Problem solving;Memory                 Orientation Level: Disoriented to;Place;Time;Situation   Memory: Decreased short-term memory Following Commands: Follows one step commands inconsistently;Follows one step commands with increased time     Problem Solving: Slow processing;Decreased initiation;Requires verbal cues;Requires tactile cues General Comments: very difficult to assess cognition as she is mostly deaf and also legally blind. Will respond to yes/no questions 75% of time, but quiet with ther questions such as, "Do you want to eat egg or potato?" or "What is your daughter's name?" Difficult to communicate with. Cannot really clarifiy if lack of cue/command following was cognition or her just not hearing commands.   General Comments       Exercises     Shoulder Instructions      Home Living Family/patient expects to be discharged to:: Private residence Living Arrangements: Children Available Help at Discharge: Family;Available 24 hours/day                             Additional Comments: patient is pleasantly confused, unable to provide info or name of daughter.  Per Physical thearpy note from previous date: "daughter  present who states her mother lives with her and she also has [place] services. She walks with walker and assistance. -- per admission 3 weeks ago."      Prior Functioning/Environment Level of Independence: Needs assistance  Gait / Transfers Assistance Needed: pt reports her DTR walks with her while she uses her rollator "most of the time"     Comments: patient unable to provide history, pleasantly confused. Per chart DTR is patient's caregiver. Patient is legally blind --per admission 3 weeks ago        OT Problem List: Impaired UE functional use;Impaired balance (sitting and/or standing);Impaired vision/perception      OT Treatment/Interventions:      OT Goals(Current goals can be found in the care plan section) Acute Rehab OT Goals OT Goal Formulation: Patient unable to participate in goal setting  OT Frequency:     Barriers to D/C:            Co-evaluation              AM-PAC OT "6 Clicks" Daily Activity     Outcome Measure Help from another person eating meals?: Total Help from another person taking care of personal grooming?: Total  Help from another person toileting, which includes using toliet, bedpan, or urinal?: Total Help from another person bathing (including washing, rinsing, drying)?: Total Help from another person to put on and taking off regular upper body clothing?: Total Help from another person to put on and taking off regular lower body clothing?: Total 6 Click Score: 6   End of Session Nurse Communication: Other (comment) (Need of Total Assist for feeding, blindness, hand contractures.)  Activity Tolerance: Patient tolerated treatment well Patient left: in bed;with nursing/sitter in room  OT Visit Diagnosis: Muscle weakness (generalized) (M62.81);Other symptoms and signs involving the nervous system (R29.898);Other symptoms and signs involving cognitive function;Feeding difficulties (R63.3);Low vision, both eyes (H54.2)                Time:  1610-9604 OT Time Calculation (min): 30 min Charges:  OT General Charges $OT Visit: 1 Visit OT Evaluation $OT Eval Low Complexity: 1 Low OT Treatments $Self Care/Home Management : 8-22 mins  [place], OT Acute Rehab Services Office: [phone] 01/06/2021  Julien Girt 01/06/2021, 9:46 AM

## 2021-01-06 NOTE — Consult Note (Signed)
Consultation Note Date: 01/06/2021   Patient Name: [name]  DOB: Sep 30, 1929  MRN: [id number]  Age / Sex: 87 y.o., female  PCP: Vicenta Aly, [place] Referring Physician: Mercy Riding, MD  Reason for Consultation: Establishing goals of care  HPI/Patient Profile: 87 y.o. female   admitted on 01/04/2021    Clinical Assessment and Goals of Care: 87 year old lady with history of dementia visual and hearing impairment, history of stroke severe aortic stenosis moderate mitral valve regurgitation atrial fibrillation diabetes hypertension dyslipidemia, chronic bladder outlet obstruction with large diverticulum status post cystoscopy and stent placement.  Patient with debility and recurrent hospitalizations 3-4 weeks ago.  She was seen by palliative services at that time and recommendations were made for home-based palliative care.  Patient admitted with possible dehydration, possible urinary tract infection.  Palliative services requested for ongoing goals of care discussions.  Patient is awake reasonably alert.  She is a frail looking elderly person.  She does not appear to be in distress.  With 2 person assist, she is able to be propped up with pillows and is able to eat some.  She is able to answer a few questions appropriately.  Call placed and discussed briefly with daughter [name] at [phone].  Introduced myself and palliative care as follows:  Palliative medicine is specialized medical care for people living with serious illness. It focuses on providing relief from the symptoms and stress of a serious illness. The goal is to improve quality of life for both the patient and the family.  Goals of care: Broad aims of medical therapy in relation to the patient's values and preferences. Our aim is to provide medical care aimed at enabling patients to achieve the goals that matter most to  them, given the circumstances of their particular medical situation and their constraints.   See discussions below.  Thank you for the consult.   HCPOA Daughter  SUMMARY OF RECOMMENDATIONS   Continue current mode of care Recommend discharge home with home-based palliative care: Patient has a scheduled appointment with palliative services that were arranged for at the time of her previous hospitalization, she was discharged recently.  Discussed with daughter about continuing with home-based palliative care after initial consultation.  Code Status/Advance Care Planning:  Limited code    Symptom Management:      Palliative Prophylaxis:   Delirium Protocol   Psycho-social/Spiritual:   Desire for further Chaplaincy support:yes  Additional Recommendations: Caregiving  Support/Resources  Prognosis:   Unable to determine  Discharge Planning: To Be Determined      Primary Diagnoses: Present on Admission: . Metabolic encephalopathy . UTI (urinary tract infection) . Debility . Atrial fibrillation, chronic ([place])   I have reviewed the medical record, interviewed the patient and family, and examined the patient. The following aspects are pertinent.  Past Medical History:  Diagnosis Date  . Cancer ([place])    skin  . Coronary artery disease   . Diabetes mellitus   . Diabetic retinopathy   . Dyslipidemia   . Family  history of adverse reaction to anesthesia    pts daughter vomits and has very difficult time to awaken  . H/O: CVA (cardiovascular accident)   . Hearing loss   . Heart murmur   . History of kidney stones   . Hypertension   . Stroke [facility]) 03/2020   x5   Social History   Socioeconomic History  . Marital status: Widowed    Spouse name: Not on file  . Number of children: 1  . Years of education: Not on file  . Highest education level: Not on file  Occupational History  . Occupation: retired  Tobacco Use  . Smoking status: Never Smoker  . Smokeless  tobacco: Never Used  Vaping Use  . Vaping Use: Never used  Substance and Sexual Activity  . Alcohol use: No  . Drug use: Never  . Sexual activity: Not on file  Other Topics Concern  . Not on file  Social History Narrative  . Not on file   Social Determinants of Health   Financial Resource Strain: Not on file  Food Insecurity: Not on file  Transportation Needs: Not on file  Physical Activity: Not on file  Stress: Not on file  Social Connections: Not on file   Family History  Problem Relation Age of Onset  . Prostate cancer Other    Scheduled Meds: . apixaban  10 mg Oral BID   Followed by  . [START ON 01/12/2021] apixaban  5 mg Oral BID  . atorvastatin  40 mg Oral Daily  . donepezil  10 mg Oral QHS  . insulin aspart  0-5 Units Subcutaneous QHS  . insulin aspart  0-9 Units Subcutaneous TID WC  . lactulose  20 g Oral Daily   Continuous Infusions: . lactated ringers 75 mL/hr at 01/05/21 1057   PRN Meds:.acetaminophen **OR** acetaminophen, albuterol, ondansetron **OR** ondansetron (ZOFRAN) IV Medications Prior to Admission:  Prior to Admission medications   Medication Sig Start Date End Date Taking? Authorizing Provider  apixaban (ELIQUIS) 2.5 MG TABS tablet Take 1 tablet (2.5 mg total) by mouth 2 (two) times daily. 12/20/20 03/20/21 Yes Hall, Carole N, DO  atorvastatin (LIPITOR) 40 MG tablet Take 1 tablet (40 mg total) by mouth daily. 12/21/20 03/21/21 Yes Hall, Carole N, DO  CRANBERRY PO Take 1 tablet by mouth 2 (two) times daily.   Yes [provider]  donepezil (ARICEPT) 10 MG tablet Take 10 mg by mouth at bedtime.   Yes [provider]  fenofibrate 160 MG tablet Take 160 mg by mouth at bedtime.   Yes [provider]  furosemide (LASIX) 20 MG tablet Take 1 tablet (20 mg total) by mouth daily as needed for fluid or edema (swelling, weight gain >2 lbs). 12/21/20  Yes Hall, Carole N, DO  insulin NPH-insulin regular (NOVOLIN 70/30) (70-30) 100 UNIT/ML  injection Inject 1-10 Units into the skin 2 (two) times daily as needed (high blood sugar). If blood sugar is over 150 takes 1 unit ; sliding scale   Yes [provider]  lactulose (CHRONULAC) 10 GM/15ML solution Take 30 mLs (20 g total) by mouth daily. 12/23/20  Yes Nita Sells, MD  melatonin 3 MG TABS tablet Take 1 tablet (3 mg total) by mouth at bedtime as needed for up to 20 days. Patient taking differently: Take 3 mg by mouth daily. 12/20/20 01/09/21 Yes Kayleen Memos, DO  mupirocin ointment (BACTROBAN) 2 % Apply 1 application topically daily as needed (cuts).   Yes [provider]  OXYGEN Inhale 2 L into the lungs at bedtime.   Yes [provider]  CONTOUR NEXT TEST test strip 1 each 3 (three) times daily. 12/05/20   [provider]  Insulin Pen Needle (B-D ULTRAFINE III SHORT PEN) 31G X 8 MM MISC See admin instructions. 12/13/20   [provider]   Allergies  Allergen Reactions  . Codeine     Syncope    Review of Systems Denies pain.   Physical Exam I saw the patient early this morning.  She was being assisted by nursing staff as well as physical therapy with her breakfast.  With assistance and verbal cues, patient was noted to be eating and swallowing and ate a fair bit of her breakfast.  She was able to answer appropriately to questions asked, mostly yes/no.  She denied pain, she denied shortness of breath. Noted to be thin and frail looking Regular work of breathing No edema Abdomen not distended  Vital Signs: BP (!) 145/48 (BP Location: Left Arm)   Pulse 68   Temp 97.7 F (36.5 C) (Oral)   Resp 15   Ht 5\' 4"  (1.626 m)   Wt 48 kg   SpO2 100%   BMI 18.16 kg/m  Pain Scale: 0-10   Pain Score: 0-No pain   SpO2: SpO2: 100 % O2 Device:SpO2: 100 % O2 Flow Rate: .   IO: Intake/output summary:   Intake/Output Summary (Last 24 hours) at 01/06/2021 1631 Last data filed at 01/06/2021 1100 Gross per 24 hour  Intake 360 ml   Output 1650 ml  Net -1290 ml    LBM: Last BM Date:  (UTA) Baseline Weight: Weight: 48 kg Most recent weight: Weight: 48 kg     Palliative Assessment/Data:   PPS 40%  Time In:  1530 Time Out:  1630 Time Total:  60 min.  Greater than 50%  of this time was spent counseling and coordinating care related to the above assessment and plan.  Signed by: Loistine Chance, MD   Please contact Palliative Medicine Team phone at [phone] for questions and concerns.  For individual provider: See Shea Evans

## 2021-01-07 DIAGNOSIS — Z794 Long term (current) use of insulin: Secondary | ICD-10-CM

## 2021-01-07 DIAGNOSIS — R1312 Dysphagia, oropharyngeal phase: Secondary | ICD-10-CM

## 2021-01-07 DIAGNOSIS — R636 Underweight: Secondary | ICD-10-CM

## 2021-01-07 LAB — RENAL FUNCTION PANEL
Albumin: 2.6 g/dL — ABNORMAL LOW (ref 3.5–5.0)
Anion gap: 7 (ref 5–15)
BUN: 14 mg/dL (ref 8–23)
CO2: 25 mmol/L (ref 22–32)
Calcium: 9.5 mg/dL (ref 8.9–10.3)
Chloride: 107 mmol/L (ref 98–111)
Creatinine, Ser: 0.75 mg/dL (ref 0.44–1.00)
GFR, Estimated: >60 mL/min (ref >60)
Glucose, Bld: 120 mg/dL — ABNORMAL HIGH (ref 70–99)
Phosphorus: 2.5 mg/dL (ref 2.5–4.6)
Potassium: 3.4 mmol/L — ABNORMAL LOW (ref 3.5–5.1)
Sodium: 139 mmol/L (ref 135–145)

## 2021-01-07 LAB — CBC
HCT: 32.8 % — ABNORMAL LOW (ref 36.0–46.0)
Hemoglobin: 9.9 g/dL — ABNORMAL LOW (ref 12.0–15.0)
MCH: 26.4 pg (ref 26.0–34.0)
MCHC: 30.2 g/dL (ref 30.0–36.0)
MCV: 87.5 fL (ref 80.0–100.0)
Platelets: 373 10*3/uL (ref 150–400)
RBC: 3.75 MIL/uL — ABNORMAL LOW (ref 3.87–5.11)
RDW: 15 % (ref 11.5–15.5)
WBC: 7.9 10*3/uL (ref 4.0–10.5)
nRBC: 0 % (ref 0.0–0.2)

## 2021-01-07 LAB — GLUCOSE, CAPILLARY
Glucose-Capillary: 114 mg/dL — ABNORMAL HIGH (ref 70–99)
Glucose-Capillary: 123 mg/dL — ABNORMAL HIGH (ref 70–99)
Glucose-Capillary: 153 mg/dL — ABNORMAL HIGH (ref 70–99)

## 2021-01-07 LAB — MAGNESIUM: Magnesium: 1.9 mg/dL (ref 1.7–2.4)

## 2021-01-07 MED ORDER — APIXABAN (ELIQUIS) VTE STARTER PACK (10MG AND 5MG): ORAL_TABLET | ORAL | 0 refills | Status: DC

## 2021-01-07 MED ORDER — POTASSIUM CHLORIDE CRYS ER 20 MEQ PO TBCR
40.0000 meq | EXTENDED_RELEASE_TABLET | Freq: Once | ORAL | Status: AC
  Administered 2021-01-07: 40 meq via ORAL
  Filled 2021-01-07: qty 2

## 2021-01-07 MED ORDER — APIXABAN 5 MG PO TABS: 5.0000 mg | ORAL_TABLET | Freq: Two times a day (BID) | ORAL | 2 refills | Status: DC

## 2021-01-07 NOTE — TOC Transition Note (Addendum)
Transition of Care [facility]) - CM/SW Discharge Note   Patient Details  Name: Rebecca Pruitt MRN: [id number] Date of Birth: [birth year]  [facility] CM/SW Contact:  Trish Mage, LCSW Phone Number: 01/07/2021, 11:16 AM   Clinical Narrative:  Confirmed with MD that patient is stable for d/c today.  Spoke with [name] (Daughter) [phone].  She states they have hospital bed already, as well as bedside commode, walker and wheelchair.  She declined offer of hoyer lift, stating she believes her mother will return to baseline soon and be able to transfer with help and ambulate short distances with walker. However, she did request order for transfer chair so that it is easier to take her mother to medical appointments.  They have been getting services from [facility], and she requests addition of aide to help with hygiene.  MD alerted. Orders seen and appreciated. Contacted [facility] re: delivery of transport chair to room.  Daughter will pick up patient this evening after 5 when her husband gets home as they do not want to use ambulance transport again.  No further needs identified.  TOC sign off.  Addendum: Daughter decided she did not want chair after all, and left it at hospital.  Called [facility] and asked them to pick it up.    Final next level of care: [place] Barriers to Discharge: No Barriers Identified   Patient Goals and CMS Choice        Discharge Placement                       Discharge Plan and Services                                     Social Determinants of Health (SDOH) Interventions     Readmission Risk Interventions Readmission Risk Prevention Plan 12/20/2020  Transportation Screening Complete  PCP or Specialist Appt within 5-7 Days Complete  Home Care Screening Complete  Medication Review (RN CM) Complete  Some recent data might be hidden

## 2021-01-07 NOTE — Discharge Summary (Addendum)
Physician Discharge Summary  [facility] DXA:[id number] DOB: [birth year] DOA: 01/04/2021  PCP: Vicenta Aly, FNP  Admit date: 01/04/2021 Discharge date: 01/07/2021  Admitted From: Home Disposition: Home  Recommendations for Outpatient Follow-up:  1. Follow ups as below. 2. Please obtain CBC/BMP/Mag at follow up 3. Please follow up on the following pending results: None  Home Health: Resumed Home health PT/OT/RN/aide/SLP Equipment/Devices: Hospital bed and transport chair.  Patient has wheelchair  Discharge Condition: Stable CODE STATUS: Partial code   Follow-up Information    Vicenta Aly, [place]. Schedule an appointment as soon as possible for a visit in 1 week(s).   Specialty: Nurse Practitioner Contact information: [place] [address] [phone]        Buford Dresser, MD. Schedule an appointment as soon as possible for a visit in 2 week(s).   Specialty: Cardiology Contact information: [address] [place] [address] [phone]               Hospital Course: 87 year old F with PMH of severe dementia, visual and hearing impairment, CVA, severe aortic stenosis, moderate MVR, A. fib on Eliquis, DM-2, HTN, HLD, chronic bladder outlet obstruction with large diverticulum s/p cystoscopy and stent placement on 11/26/2020, debility and recurrent hospitalization from 2/25-3/6 for AME in the setting of "ESBL UTI" for which she was treated with meropenem, returning with decline in mental status and "foul-smelling urine", and admitted for AME in the setting of possible recurrent UTI.  She was initially seen at [facility] in [place] and found to have RLE DVT the morning of admission.  UA with large LE, moderate Hgb and >50 RBC but no nitrite or bacteria.  CTH, CMP and CBC without significant finding.  Lactic acid within normal.  COVID-19 PCR, urine and blood cultures obtained.  Started on IVF and IV  meropenem and admitted.  The next day, patient's mental status improved.  Urine culture with insignificant growth.  IV meropenem discontinued.  Patient continued to do well off IV fluids. Looking back, patient has 3 negative urine cultures in the last 2 months except for ESBL reported from urology office last month for for which she completed 5 days of IV meropenem during her recent hospitalization.   Palliative medicine consulted and recommended continuing with home-based palliative care after initial consultation.  Per palliative medicine, patient has scheduled appointment with palliative service that was arranged for at the time of recent hospitalization.  Of note, patient's daughter is not happy about "early" discontinuation of antibiotics despite negative urine cultures x3, clinical improvement and my effort to explain the adverse effect of unnecessary antibiotics.    Home health resumed on discharge.  DME ordered.  See individual problem list below for more on hospital course  Discharge Diagnoses:  Acute metabolic encephalopathy/possible delirium/dementia-unclear etiology.  Not on sedating medications.  Urinalysis with large LE, moderate Hgb and 50 RBC's but no nitrite or bacteria.  CMP, CBC with diff, TSH, B12, ammonia, RPR and CTH without acute finding.  No focal neuro deficit but limited exam. She might have been dehydrated.  She has visual and hearing impairments as well.  -IV meropenem 3/19-3/20.  Urine culture negative.  -Continue home Aricept -Continue dysphagia 1 diet -PT/OT recommended DME-hospital bed, Hoyer lift, wheelchair, wheelchair cushion  Pyuria: UTI ruled out.  UA as above.  Reportedly had ESBL in urine at urology office last month for which she completed 5 days of IV meropenem during her recent hospitalization. -IV meropenem 3/18-3/20.  Discontinued after negative cultures -Of note, patient  has had negative urine culture x3.   Addendum Dysphagia: SLP recommended  dysphagia 1 diet. -Discharged on dysphagia 1 diet -Home health SLP ordered.  Acute RLE DVT-noted on [place] at [facility] on 3/18 -Discharged on starter pack Eliquis  Chronic A. fib: Rate controlled. -Continue home meds  Chronic diastolic CHF/Severe AS/moderate MVR: TTE on 2/4:LVEF of 65-70%, sevLVH, G2DD, sevLAE, sevAS, mod pericardial effusion, modMVR & nl RVSP.  Appears euvolemic.  Good urine output. -Continue home Lasix as needed -Outpatient follow-up with cardiologist as previously planned but doubt candidacy for TAVR   Controlled IDDM-2 with hyperglycemia and hyperlipidemia: A1c 6.6% in 12/16/2020 Recent Labs  Lab 01/06/21 1132 01/06/21 1603 01/06/21 2044 01/07/21 0744 01/07/21 1153  GLUCAP 147* 190* 128* 114* 123*  -Continue home 70/30 -Recommend regular diet -Continue statin given history of CVA.  Discontinued fenofibrate  Borderline hypercalcemia: Ca 10.2 (corrects to 11.2 for hypoalbuminemia).  Likely due to dehydration.  PTH within normal.  Ionized calcium on the higher side of normal. -Recommend good hydration  History of CVA: No focal neuro deficit on exam but limited exam.  CTH without acute finding. -Continue home statin.  Debility/physical deconditioning: Walker and wheelchair dependent at baseline.  Per daughter, was able to ambulate about "34 feet" before previous hospitalization. -Home health and DME ordered  Goal of care: Patient is partial code with no CPR but everything else.  -Palliative medicine recommended outpatient follow-up as previously scheduled  Underweight in the setting of dysphagia, 87 advanced age, dementia and poor p.o. intake Body mass index is 18.16 kg/m.  -Liberate diet to regular dysphagia 1 diet      Pressure Injury 11/23/20 Coccyx Mid Stage 1 -  Intact skin with non-blanchable redness of a localized area usually over a bony prominence. pink/non blanchable (Active)  11/23/20 1640  Location: Coccyx  Location Orientation: Mid   Staging: Stage 1 -  Intact skin with non-blanchable redness of a localized area usually over a bony prominence.  Wound Description (Comments): pink/non blanchable  Present on Admission: Yes    Discharge Exam: Vitals:   01/07/21 0450 01/07/21 1403  BP: (!) 150/64 (!) 145/67  Pulse: 75 80  Resp: 16 14  Temp: 98.2 F (36.8 C) 98.5 F (36.9 C)  SpO2: 94% 97%    GENERAL: Frail and chronically ill-appearing. HEENT: MMM.  Hearing seems to be intact.  Visual impairment NECK: Supple.  No apparent JVD.  RESP: On RA.  No IWOB.  Fair aeration bilaterally. CVS:  RRR. Heart sounds normal.  ABD/GI/GU: Bowel sounds present. Soft. Non tender.  MSK/EXT:  Moves extremities.  Significant muscle mass and subcu fat loss. SKIN: no apparent skin lesion or wound NEURO: Sleepy but wakes to voice.  Not oriented.  No facial asymmetry.  Does not follows command. PSYCH: Calm.  No distress or agitation.  Discharge Instructions  Discharge Instructions    Call MD for:  difficulty breathing, headache or visual disturbances   Complete by: As directed    Call MD for:  persistant dizziness or light-headedness   Complete by: As directed    Call MD for:  persistant nausea and vomiting   Complete by: As directed    Call MD for:  temperature >100.4   Complete by: As directed    Diet general   Complete by: As directed    Dysphagia 1 (Puree);Thin liquid   Medication Administration: Crushed with puree Supervision: assist with self feeding Compensations: Minimize environmental distractions;Small sips/bites;Slow rate  Discharge instructions   Complete by: As directed    It has been a pleasure taking care of you!  You were hospitalized with altered mental status likely from dehydration, and deep venous thrombosis (blood clot in the leg). Your urine culture did not show bacteria or urinary tract infection.  Your symptoms improved with IV fluid hydration.  We are discharging you on Eliquis for blood clot. We  recommend good hydration as much as possible. Follow-up with your primary care doctor in 1 to 2 weeks.   Take care,   Increase activity slowly   Complete by: As directed    No wound care   Complete by: As directed      Allergies as of 01/07/2021      Reactions   Codeine    Syncope       Medication List    STOP taking these medications   apixaban 2.5 MG Tabs tablet Commonly known as: ELIQUIS Replaced by: Apixaban Starter Pack (10mg  and 5mg )   Contour Next Test test strip Generic drug: glucose blood   fenofibrate 160 MG tablet   lactulose 10 GM/15ML solution Commonly known as: CHRONULAC     TAKE these medications   Apixaban Starter Pack (10mg  and 5mg ) Commonly known as: ELIQUIS STARTER PACK Take as directed on package: start with two-5mg  tablets twice daily for 7 days. On day 8, switch to one-5mg  tablet twice daily. Replaces: apixaban 2.5 MG Tabs tablet   apixaban 5 MG Tabs tablet Commonly known as: ELIQUIS Take 1 tablet (5 mg total) by mouth 2 (two) times daily. Start after completing the starter pack Start taking on: February 06, 2021   atorvastatin 40 MG tablet Commonly known as: LIPITOR Take 1 tablet (40 mg total) by mouth daily.   B-D ULTRAFINE III SHORT PEN 31G X 8 MM Misc Generic drug: Insulin Pen Needle See admin instructions.   CRANBERRY PO Take 1 tablet by mouth 2 (two) times daily.   donepezil 10 MG tablet Commonly known as: ARICEPT Take 10 mg by mouth at bedtime.   furosemide 20 MG tablet Commonly known as: LASIX Take 1 tablet (20 mg total) by mouth daily as needed for fluid or edema (swelling, weight gain >2 lbs).   insulin NPH-regular Human (70-30) 100 UNIT/ML injection Inject 1-10 Units into the skin 2 (two) times daily as needed (high blood sugar). If blood sugar is over 150 takes 1 unit ; sliding scale   melatonin 3 MG Tabs tablet Take 1 tablet (3 mg total) by mouth at bedtime as needed for up to 20 days. What changed: when to take this    mupirocin ointment 2 % Commonly known as: BACTROBAN Apply 1 application topically daily as needed (cuts).   OXYGEN Inhale 2 L into the lungs at bedtime.            Durable Medical Equipment  (From admission, onward)         Start     Ordered   01/07/21 1129  For home use only DME Other see comment  Once       Comments: Transport chair  Question:  Length of Need  Answer:  Lifetime   01/07/21 1129   01/07/21 0753  For home use only DME Hospital bed  Once       Question Answer Comment  Length of Need Lifetime   Head must be elevated greater than: 30 degrees   Bed type Semi-electric   Hoyer Lift Yes   Trapeze  Bar Yes   Support Surface: Gel Overlay      01/07/21 0755          Consultations:  Palliative medicine  Procedures/Studies:   CT Head Wo Contrast  Result Date: 01/04/2021 CLINICAL DATA:  Mental status changes EXAM: CT HEAD WITHOUT CONTRAST TECHNIQUE: Contiguous axial images were obtained from the base of the skull through the vertex without intravenous contrast. COMPARISON:  12/16/2020 FINDINGS: Brain: There is atrophy and chronic small vessel disease changes. No acute intracranial abnormality. Specifically, no hemorrhage, hydrocephalus, mass lesion, acute infarction, or significant intracranial injury. Vascular: No hyperdense vessel or unexpected calcification. Skull: No acute calvarial abnormality. Sinuses/Orbits: No acute findings Other: None IMPRESSION: Atrophy, chronic microvascular disease. No acute intracranial abnormality. Electronically Signed   By: Rolm Baptise M.D.   On: 01/04/2021 23:33   MR ANGIO HEAD WO CONTRAST  Result Date: 12/16/2020 CLINICAL DATA:  Neuro deficit, acute, stroke suspected EXAM: MRI HEAD WITHOUT CONTRAST MRA HEAD WITHOUT CONTRAST TECHNIQUE: Multiplanar, multiecho pulse sequences of the brain and surrounding structures were obtained without intravenous contrast. Angiographic images of the head were obtained using MRA technique  without contrast. COMPARISON:  12/16/2020 and prior. FINDINGS: MRI HEAD FINDINGS Brain: No diffusion-weighted signal abnormality. No intracranial hemorrhage. No midline shift, ventriculomegaly or extra-axial fluid collection. No mass lesion. Moderate cerebral atrophy with ex vacuo dilatation. Chronic left pontine and right thalamic and bilateral corona radiata/centrum semiovale lacunar insults. Advanced chronic microvascular ischemic changes. Vascular: Please see MRA. Skull and upper cervical spine: Normal marrow signal. Sinuses/Orbits: Sequela of bilateral lens replacement. Clear paranasal sinuses. Pneumatized mastoid air cells. Other: None. MRA HEAD FINDINGS Anterior circulation: Patent ICAs and ophthalmic artery origins. Patent ACAs, trifurcated. Patent MCAs. Mild bilateral M2 segment narrowing. Posterior circulation: Dominant left vertebral artery supplies the basilar. Patent left PICA. Right vertebral terminates as PICA. Diminutive basilar artery, patent. Patent superior cerebellar arteries with mild proximal narrowing on the left. Fetal origin of the bilateral PCAs. Mild left P3 segment narrowing. Anatomic variants: Please see above. IMPRESSION: MRI head: No acute intracranial process. Multifocal lacunar insults as detailed above. Moderate cerebral atrophy and advanced chronic microvascular ischemic changes. MRA head: No emergent vascular finding within the head. Mild bilateral M2, left superior cerebellar artery and left P3 segment narrowing. Electronically Signed   By: Primitivo Gauze M.D.   On: 12/16/2020 18:56   MR BRAIN WO CONTRAST  Result Date: 12/16/2020 CLINICAL DATA:  Neuro deficit, acute, stroke suspected EXAM: MRI HEAD WITHOUT CONTRAST MRA HEAD WITHOUT CONTRAST TECHNIQUE: Multiplanar, multiecho pulse sequences of the brain and surrounding structures were obtained without intravenous contrast. Angiographic images of the head were obtained using MRA technique without contrast. COMPARISON:   12/16/2020 and prior. FINDINGS: MRI HEAD FINDINGS Brain: No diffusion-weighted signal abnormality. No intracranial hemorrhage. No midline shift, ventriculomegaly or extra-axial fluid collection. No mass lesion. Moderate cerebral atrophy with ex vacuo dilatation. Chronic left pontine and right thalamic and bilateral corona radiata/centrum semiovale lacunar insults. Advanced chronic microvascular ischemic changes. Vascular: Please see MRA. Skull and upper cervical spine: Normal marrow signal. Sinuses/Orbits: Sequela of bilateral lens replacement. Clear paranasal sinuses. Pneumatized mastoid air cells. Other: None. MRA HEAD FINDINGS Anterior circulation: Patent ICAs and ophthalmic artery origins. Patent ACAs, trifurcated. Patent MCAs. Mild bilateral M2 segment narrowing. Posterior circulation: Dominant left vertebral artery supplies the basilar. Patent left PICA. Right vertebral terminates as PICA. Diminutive basilar artery, patent. Patent superior cerebellar arteries with mild proximal narrowing on the left. Fetal origin of the bilateral PCAs. Mild left  P3 segment narrowing. Anatomic variants: Please see above. IMPRESSION: MRI head: No acute intracranial process. Multifocal lacunar insults as detailed above. Moderate cerebral atrophy and advanced chronic microvascular ischemic changes. MRA head: No emergent vascular finding within the head. Mild bilateral M2, left superior cerebellar artery and left P3 segment narrowing. Electronically Signed   By: Primitivo Gauze M.D.   On: 12/16/2020 18:56   CT HEAD CODE STROKE WO CONTRAST  Result Date: 12/16/2020 CLINICAL DATA:  Code stroke.  Neuro deficit, acute, stroke suspected EXAM: CT HEAD WITHOUT CONTRAST TECHNIQUE: Contiguous axial images were obtained from the base of the skull through the vertex without intravenous contrast. COMPARISON:  11/22/2020 and prior. FINDINGS: Brain: No acute infarct or intracranial hemorrhage. No mass lesion. No midline shift,  ventriculomegaly or extra-axial fluid collection. Diffuse parenchymal volume loss with ex vacuo dilatation. Chronic microvascular ischemic changes. Vascular: No hyperdense vessel or unexpected calcification. Bilateral skull base atherosclerotic calcifications. Skull: No acute finding.  Sequela of right mastoidectomy. Sinuses/Orbits: Normal orbits. Clear paranasal sinuses. No mastoid effusion. Other: None. ASPECTS [facility] Stroke Program Early CT Score) - Ganglionic level infarction (caudate, lentiform nuclei, internal capsule, insula, M1-M3 cortex): 7 - Supraganglionic infarction (M4-M6 cortex): 3 Total score (0-10 with 10 being normal): 10 IMPRESSION: 1. No acute intracranial process. Advanced chronic microvascular ischemic changes. 2. ASPECTS is 10 3. Code stroke imaging results were communicated on 12/16/2020 at 5:28 pm to provider Dr. Rory Percy via secure text paging. Electronically Signed   By: Primitivo Gauze M.D.   On: 12/16/2020 17:29       The results of significant diagnostics from this hospitalization (including imaging, microbiology, ancillary and laboratory) are listed below for reference.     Microbiology: Recent Results (from the past 240 hour(s))  Urine culture     Status: Abnormal   Collection Time: 01/04/21  8:42 PM   Specimen: Urine, Random  Result Value Ref Range Status   Specimen Description   Final    URINE, RANDOM Performed at [facility] [address], [place], [place] [address]    Special Requests   Final    NONE Performed at [facility], [place] [address] [place], [place] [address]    Culture (A)  Final    <10,000 COLONIES/mL INSIGNIFICANT GROWTH Performed at [facility] [address], [place], [place] [address]    Report Status 01/06/2021 FINAL  Final  Blood culture (routine x 2)     Status: None (Preliminary result)   Collection Time: 01/04/21  9:49 PM   Specimen: BLOOD  Result Value Ref Range Status    Specimen Description   Final    BLOOD Performed at [facility] [address] [place], [place] [address]    Special Requests   Final    BOTTLES DRAWN AEROBIC AND ANAEROBIC Blood Culture results may not be optimal due to an inadequate volume of blood received in culture bottles Performed at [facility] [address] [place], [place] [address]    Culture   Final    NO GROWTH 2 DAYS Performed at [facility] [address] [place], [place] [address]    Report Status PENDING  Incomplete  SARS CORONAVIRUS 2 (TAT 6-24 HRS) Nasopharyngeal Nasopharyngeal Swab     Status: None   Collection Time: 01/04/21 11:57 PM   Specimen: Nasopharyngeal Swab  Result Value Ref Range Status   SARS Coronavirus 2 NEGATIVE NEGATIVE Final    Comment: (NOTE)  SARS-CoV-2 target nucleic acids are NOT DETECTED.  The SARS-CoV-2 RNA is generally detectable in upper and lower respiratory specimens during the acute phase of infection. Negative results do not preclude SARS-CoV-2 infection, do not rule out co-infections with other pathogens, and should not be used as the sole basis for treatment or other patient management decisions. Negative results must be combined with clinical observations, patient history, and epidemiological information. The expected result is Negative.  Fact Sheet for Patients: SugarRoll.be  Fact Sheet for Healthcare Providers: https://www.woods-mathews.com/  This test is not yet approved or cleared by the [place] FDA and  has been authorized for detection and/or diagnosis of SARS-CoV-2 by FDA under an Emergency Use Authorization (EUA). This EUA will remain  in effect (meaning this test can be used) for the duration of the COVID-19 declaration under Se ction 564(b)(1) of the Act, 21 U.S.C. section 360bbb-3(b)(1), unless the authorization is terminated or revoked sooner.  Performed at [facility], [place] [address], [place], [place] [address]   Blood culture (routine x 2)     Status: None (Preliminary result)   Collection Time: 01/05/21  3:39 AM   Specimen: BLOOD  Result Value Ref Range Status   Specimen Description   Final    BLOOD LEFT ANTECUBITAL Performed at [facility] [address] [place], [place] [address]    Special Requests   Final    BOTTLES DRAWN AEROBIC ONLY Blood Culture adequate volume Performed at [facility] [address], [place], [place] [address]    Culture   Final    NO GROWTH 2 DAYS Performed at [facility] [address], [place], [place] [address]    Report Status PENDING  Incomplete     Labs:  CBC: Recent Labs  Lab 01/04/21 2127 01/05/21 0339 01/07/21 0332  WBC 8.3 6.2 7.9  NEUTROABS 5.2  --   --   HGB 11.2* 10.3* 9.9*  HCT 35.6* 34.1* 32.8*  MCV 87.0 89.3 87.5  PLT 387 401* 373   BMP &GFR Recent Labs  Lab 01/04/21 2127 01/05/21 0339 01/07/21 0332  NA 139 143 139  K 4.8 3.5 3.4*  CL 105 109 107  CO2 25 25 25   GLUCOSE 262* 180* 120*  BUN 26* 22 14  CREATININE 0.84 0.68 0.75  CALCIUM 10.2 9.9 9.5  MG  --   --  1.9  PHOS  --   --  2.5   Estimated Creatinine Clearance: 36.1 mL/min (by C-G formula based on SCr of 0.75 mg/dL). Liver & Pancreas: Recent Labs  Lab 01/04/21 2127 01/05/21 0339 01/07/21 0332  AST 28 14*  --   ALT 14 9  --   ALKPHOS 37* 35*  --   BILITOT 1.1 0.5  --   PROT 6.7 6.5  --   ALBUMIN 2.8* 2.8* 2.6*   No results for input(s): LIPASE, AMYLASE in the last 168 hours. Recent Labs  Lab 01/06/21 0315  AMMONIA 13   Diabetic: No results for input(s): HGBA1C in the last 72 hours. Recent Labs  Lab 01/06/21 1132 01/06/21 1603 01/06/21 2044 01/07/21 0744 01/07/21 1153  GLUCAP 147* 190* 128* 114* 123*   Cardiac Enzymes: No results for input(s): CKTOTAL, CKMB, CKMBINDEX, TROPONINI in the last 168 hours. No results for input(s): PROBNP  in the last 8760 hours. Coagulation Profile: No results for input(s): INR, PROTIME in the last 168 hours. Thyroid Function Tests: Recent Labs    01/06/21 0315  TSH 1.533   Lipid Profile: No results for input(s): CHOL, HDL, LDLCALC, TRIG, CHOLHDL, LDLDIRECT in the last 72 hours. Anemia Panel: Recent Labs    01/06/21 0315  VITAMINB12 579   Urine analysis:    Component Value Date/Time   COLORURINE YELLOW 01/04/2021 2042   APPEARANCEUR TURBID (A) 01/04/2021 2042   LABSPEC 1.012 01/04/2021 2042   PHURINE 6.0 01/04/2021 2042   GLUCOSEU 150 (A) 01/04/2021 2042   HGBUR MODERATE (A) 01/04/2021 2042   BILIRUBINUR NEGATIVE 01/04/2021 2042   KETONESUR NEGATIVE 01/04/2021 2042   PROTEINUR 100 (A) 01/04/2021 2042   UROBILINOGEN 0.2 05/26/2008 0817   NITRITE NEGATIVE 01/04/2021 2042   LEUKOCYTESUR LARGE (A) 01/04/2021 2042   Sepsis Labs: Invalid input(s): PROCALCITONIN, LACTICIDVEN   Time coordinating discharge: 40 minutes  SIGNED:  Mercy Riding, MD  [facility] 01/07/2021, 3:08 PM  If 7PM-7AM, please contact night-coverage [email or web address]

## 2021-01-10 LAB — CULTURE, BLOOD (ROUTINE X 2)
Culture: NO GROWTH
Culture: NO GROWTH
Special Requests: ADEQUATE

## 2021-01-14 ENCOUNTER — Ambulatory Visit (INDEPENDENT_AMBULATORY_CARE_PROVIDER_SITE_OTHER): Payer: Medicare Other | Admitting: Internal Medicine

## 2021-01-14 ENCOUNTER — Other Ambulatory Visit: Payer: Self-pay

## 2021-01-14 ENCOUNTER — Encounter: Payer: Self-pay | Admitting: Internal Medicine

## 2021-01-14 VITALS — BP 138/85 | HR 65

## 2021-01-14 DIAGNOSIS — F0391 Unspecified dementia with behavioral disturbance: Secondary | ICD-10-CM | POA: Diagnosis present

## 2021-01-14 DIAGNOSIS — Z8619 Personal history of other infectious and parasitic diseases: Secondary | ICD-10-CM

## 2021-01-14 NOTE — Progress Notes (Signed)
[facility] for Infectious Disease  CHIEF COMPLAINT:    Follow up for history of possible ESBL UTI  SUBJECTIVE:    [name] is a 87 y.o. female with PMHx as below who presents to the clinic for further evaluation of history of urinary tract infections and whether ongoing antibiotics are indicated.   Patient was previously seen by me on 12/14/2020 as a new patient in the setting of ESBL UTI and episodes of recurrent delirium.  She was accompanied by her daughter and granddaughter at that time and was concerned that her recurrent delirium episodes were the result of a hidden infection that had been ongoing for approximately 6 years after she reported a prior history of "brain infection".  She stated that her mom will receive courses of antibiotics with subsequent improvement in her mental status, in particular IV antibiotics.  She had previously been told by her PCP that this is likely not related to an infection given no objective findings to support an infectious etiology and was likely the result of her progressive decline from dementia, however, she was referred to our clinic for further evaluation of this.    Unfortunately, at that initial visit the patient was found to be quite lethargic, arousing only to voice but then drifting back off.  Her daughter reported that this was a quick decline from her normal baseline.  They were awaiting PICC line placement for possible UTI with ESBL E. coli per urology orders at the time of our visit and, given her acute decline, the decision was made to have her directly admitted to initiate IV antibiotics prior to the weekend delay.  She subsequently was admitted to [facility] from 2/25 through 3/622.  Repeat urine cultures and blood cultures were obtained and both were negative.  She completed 5 days of meropenem to treat her possible cystitis and then antibiotics were discontinued.  Her hospital course was further complicated by  code stroke called for facial droop on 2/27.  CT scan and MRI brain were negative for any acute process.  There was also no evidence of meningeal enhancement or encephalitis.  Her course was further complicated by generalized weakness.  She was assessed by PT, OT with recommendation for SNF placement, however, daughter's preference was to bring patient home with home health services so this was arranged.  She was reevaluated by her primary care physician on 01/03/2021 for hospital discharge follow-up.  At that encounter the patient was noted to be present with her adult daughter who provided information as the patient was not talking that day.  Per office visit note, at that visit the patient's daughter continued to express frustration regarding her mom's health and felt like there was something wrong and the answers were not being found for the result of her dementia and mental status changes.  Again she noted that her mom perks up when hospitalized receiving IV antibiotics and fluids.  The following day on 3/18, patient was again brought to the emergency department with chief complaint of altered mental status.  On admission she was afebrile, normal heart rate, normal oxygen saturation, normal WBC.  There was a complaint of foul-smelling urine.  Urinalysis obtained showed pyuria, negative nitrites, no bacteria, proteinuria, red cells, and squamous epithelial cells.  This apparently was concerning for UTI despite the fact that there was no bacteria and was not an adequate urine specimen with the presence of epithelial cells.  Nonetheless, her urine cultures were  negative and her antibiotics were again discontinued after a couple days in the absence of any objective infectious findings.  Patient was subsequently discharged on 3/21 and presents today for follow-up.  She is accompanied by her daughter today who remains steadfast in her belief that her mom would benefit from continued antibiotics and believes that  they are the "cure for her dementia".  She reports that when her mom receives these antibiotics she becomes much more alert, is talkative, is more physically active, and overall appears much happier.  Please see A&P for the details of today's visit and status of the patient's medical problems.   Patient's Medications  New Prescriptions   No medications on file  Previous Medications   APIXABAN (ELIQUIS) 5 MG TABS TABLET    Take 1 tablet (5 mg total) by mouth 2 (two) times daily. Start after completing the starter pack   APIXABAN (ELIQUIS) VTE STARTER PACK (10MG  AND 5MG )    Take as directed on package: start with two-5mg  tablets twice daily for 7 days. On day 8, switch to one-5mg  tablet twice daily.   ATORVASTATIN (LIPITOR) 40 MG TABLET    Take 1 tablet (40 mg total) by mouth daily.   CRANBERRY PO    Take 1 tablet by mouth 2 (two) times daily.   DONEPEZIL (ARICEPT) 10 MG TABLET    Take 10 mg by mouth at bedtime.   FUROSEMIDE (LASIX) 20 MG TABLET    Take 1 tablet (20 mg total) by mouth daily as needed for fluid or edema (swelling, weight gain >2 lbs).   INSULIN NPH-INSULIN REGULAR (NOVOLIN 70/30) (70-30) 100 UNIT/ML INJECTION    Inject 1-10 Units into the skin 2 (two) times daily as needed (high blood sugar). If blood sugar is over 150 takes 1 unit ; sliding scale   INSULIN PEN NEEDLE (B-D ULTRAFINE III SHORT PEN) 31G X 8 MM MISC    See admin instructions.   MUPIROCIN OINTMENT (BACTROBAN) 2 %    Apply 1 application topically daily as needed (cuts).   OXYGEN    Inhale 2 L into the lungs at bedtime.  Modified Medications   No medications on file  Discontinued Medications   No medications on file      Past Medical History:  Diagnosis Date  . Cancer ([place])    skin  . Coronary artery disease   . Diabetes mellitus   . Diabetic retinopathy   . Dyslipidemia   . Family history of adverse reaction to anesthesia    pts daughter vomits and has very difficult time to awaken  . H/O: CVA  (cardiovascular accident)   . Hearing loss   . Heart murmur   . History of kidney stones   . Hypertension   . Stroke [facility]) 03/2020   x5    Social History   Tobacco Use  . Smoking status: Never Smoker  . Smokeless tobacco: Never Used  Vaping Use  . Vaping Use: Never used  Substance Use Topics  . Alcohol use: No  . Drug use: Never    Family History  Problem Relation Age of Onset  . Prostate cancer Other     Allergies  Allergen Reactions  . Codeine     Syncope     Review of Systems  Unable to perform ROS: Mental acuity     OBJECTIVE:    Vitals:   There is no height or weight on file to calculate BMI.  Physical Exam Constitutional:  General: She is not in acute distress.    Appearance: She is not toxic-appearing.     Comments: Frail, elderly appearing woman sitting in her wheelchair accompanied by her daughter.  HENT:     Head: Normocephalic and atraumatic.  Eyes:     Extraocular Movements: Extraocular movements intact.     Conjunctiva/sclera: Conjunctivae normal.  Pulmonary:     Effort: Pulmonary effort is normal. No respiratory distress.  Musculoskeletal:     Right lower leg: No edema.     Left lower leg: No edema.  Skin:    General: Skin is warm and dry.     Findings: No rash.  Neurological:     General: No focal deficit present.     Mental Status: Mental status is at baseline.      Labs and Microbiology: CBC Latest Ref Rng & Units 01/07/2021 01/05/2021 01/04/2021  WBC 4.0 - 10.5 K/uL 7.9 6.2 8.3  Hemoglobin 12.0 - 15.0 g/dL 9.9(L) 10.3(L) 11.2(L)  Hematocrit 36.0 - 46.0 % 32.8(L) 34.1(L) 35.6(L)  Platelets 150 - 400 K/uL 373 401(H) 387   CMP Latest Ref Rng & Units 01/07/2021 01/05/2021 01/04/2021  Glucose 70 - 99 mg/dL 120(H) 180(H) 262(H)  BUN 8 - 23 mg/dL 14 22 26(H)  Creatinine 0.44 - 1.00 mg/dL 0.75 0.68 0.84  Sodium 135 - 145 mmol/L 139 143 139  Potassium 3.5 - 5.1 mmol/L 3.4(L) 3.5 4.8  Chloride 98 - 111 mmol/L 107 109 105  CO2 22  - 32 mmol/L 25 25 25   Calcium 8.9 - 10.3 mg/dL 9.5 9.9 10.2  Total Protein 6.5 - 8.1 g/dL - 6.5 6.7  Total Bilirubin 0.3 - 1.2 mg/dL - 0.5 1.1  Alkaline Phos 38 - 126 U/L - 35(L) 37(L)  AST 15 - 41 U/L - 14(L) 28  ALT 0 - 44 U/L - 9 14     Recent Results (from the past 240 hour(s))  Urine culture     Status: Abnormal   Collection Time: 01/04/21  8:42 PM   Specimen: Urine, Random  Result Value Ref Range Status   Specimen Description   Final    URINE, RANDOM Performed at [facility], [place] [address], [place], [place] [address]    Special Requests   Final    NONE Performed at [facility], [place] [address], [place], [place] [address]    Culture (A)  Final    <10,000 COLONIES/mL INSIGNIFICANT GROWTH Performed at [facility] [address], [place], [place] [address]    Report Status 01/06/2021 FINAL  Final  Blood culture (routine x 2)     Status: None   Collection Time: 01/04/21  9:49 PM   Specimen: BLOOD  Result Value Ref Range Status   Specimen Description   Final    BLOOD Performed at [facility] [address], [place], [place] [address]    Special Requests   Final    BOTTLES DRAWN AEROBIC AND ANAEROBIC Blood Culture results may not be optimal due to an inadequate volume of blood received in culture bottles Performed at [facility] [address] [place], [place] [address]    Culture   Final    NO GROWTH 5 DAYS Performed at [facility], [place] [address], [place], [place] [address]    Report Status 01/10/2021 FINAL  Final  SARS CORONAVIRUS 2 (TAT 6-24 HRS) Nasopharyngeal Nasopharyngeal Swab     Status: None   Collection  Time: 01/04/21 11:57 PM   Specimen: Nasopharyngeal Swab  Result Value Ref Range Status   SARS Coronavirus 2 NEGATIVE NEGATIVE Final    Comment: (NOTE) SARS-CoV-2 target nucleic acids are NOT DETECTED.  The SARS-CoV-2 RNA is generally detectable in upper and  lower respiratory specimens during the acute phase of infection. Negative results do not preclude SARS-CoV-2 infection, do not rule out co-infections with other pathogens, and should not be used as the sole basis for treatment or other patient management decisions. Negative results must be combined with clinical observations, patient history, and epidemiological information. The expected result is Negative.  Fact Sheet for Patients: SugarRoll.be  Fact Sheet for Healthcare Providers: https://www.woods-mathews.com/  This test is not yet approved or cleared by the [place] FDA and  has been authorized for detection and/or diagnosis of SARS-CoV-2 by FDA under an Emergency Use Authorization (EUA). This EUA will remain  in effect (meaning this test can be used) for the duration of the COVID-19 declaration under Se ction 564(b)(1) of the Act, 21 U.S.C. section 360bbb-3(b)(1), unless the authorization is terminated or revoked sooner.  Performed at [facility], [place] [address] [place], [place] [address]   Blood culture (routine x 2)     Status: None   Collection Time: 01/05/21  3:39 AM   Specimen: BLOOD  Result Value Ref Range Status   Specimen Description   Final    BLOOD LEFT ANTECUBITAL Performed at [facility] [address] [place], [place] [address]    Special Requests   Final    BOTTLES DRAWN AEROBIC ONLY Blood Culture adequate volume Performed at [facility] [address], [place], [place] [address]    Culture   Final    NO GROWTH 5 DAYS Performed at [facility], [place] [address], [place], [place] [address]    Report Status 01/10/2021 FINAL  Final      ASSESSMENT & PLAN:    1. Dementia   2. History of ESBL E. coli  Discussed with patient's daughter that I do not believe the etiology of her mother's dementia and changes in her alertness are the result of ongoing  and/or undertreated infections.  I do not think she had a true urinary tract infection at the time of her last visit with me given the negative repeat urine cultures upon admission and after having a chance to go back and review her clinical history.  She continues to have negative urine cultures after urinalysis appear "infected" based on the presence of WBCs without significant evidence of bacteria.  Discussed with patient's daughter that prescribing antibiotics (either p.o. or IV) would not be indicated in the absence of objective findings for an infection and that antibiotics themselves are not without potential side effect and harm.  She remains quite fixed in her belief that her mom needs antibiotics for the treatment of her dementia given that she believes there is objective improvement when she is on treatment and then quick deterioration once antibiotics are stopped.  However, again I emphasized that in the absence of any other objective findings for an ongoing infection that further antibiotics would not be indicated and that the changes in her mental status are likely multifactorial and driven by her underlying cognitive deficits.  Unfortunately I do not think I was able to provide the answers and resolution that her daughter was looking for.  Hopefully she will continue to follow-up closely with her primary care physician and palliative  care as she continues to diligently work to provide the best care for her mom.    Raynelle Highland for Infectious Disease [facility] 01/14/2021, 3:58 PM  I spent 40 minutes dedicated to the care of this patient on the date of this encounter to include pre-visit review of records, face-to-face time with the patient discussing dementia, UTI, antibiotic stewardship

## 2021-01-14 NOTE — Patient Instructions (Signed)
Thank you for coming to see me today. It was a pleasure seeing you.  To Do: Marland Kitchen Please continue to follow up with your primary care for coordination of your mom's care . It was nice seeing you today and I apologize that I cannot give you the answer you are looking for, but I do not think more antibiotics will help the current situation.    Take Care, Jule Ser

## 2021-01-15 ENCOUNTER — Ambulatory Visit: Payer: Medicare Other | Admitting: General Practice

## 2021-01-15 ENCOUNTER — Encounter: Payer: Self-pay | Admitting: Internal Medicine

## 2021-01-17 ENCOUNTER — Emergency Department (HOSPITAL_COMMUNITY): Payer: Medicare Other

## 2021-01-17 ENCOUNTER — Encounter (HOSPITAL_COMMUNITY): Payer: Self-pay

## 2021-01-17 ENCOUNTER — Other Ambulatory Visit: Payer: Self-pay

## 2021-01-17 ENCOUNTER — Emergency Department (HOSPITAL_COMMUNITY)
Admission: EM | Admit: 2021-01-17 | Discharge: 2021-01-17 | Disposition: A | Discharge status: Home / Self Care | Payer: Medicare Other | Attending: Emergency Medicine | Admitting: Emergency Medicine

## 2021-01-17 DIAGNOSIS — R55 Syncope and collapse: Secondary | ICD-10-CM | POA: Insufficient documentation

## 2021-01-17 DIAGNOSIS — E11319 Type 2 diabetes mellitus with unspecified diabetic retinopathy without macular edema: Secondary | ICD-10-CM | POA: Insufficient documentation

## 2021-01-17 DIAGNOSIS — I10 Essential (primary) hypertension: Secondary | ICD-10-CM | POA: Insufficient documentation

## 2021-01-17 DIAGNOSIS — Z85828 Personal history of other malignant neoplasm of skin: Secondary | ICD-10-CM | POA: Insufficient documentation

## 2021-01-17 DIAGNOSIS — F0390 Unspecified dementia without behavioral disturbance: Secondary | ICD-10-CM | POA: Insufficient documentation

## 2021-01-17 DIAGNOSIS — Z7901 Long term (current) use of anticoagulants: Secondary | ICD-10-CM | POA: Insufficient documentation

## 2021-01-17 DIAGNOSIS — Z79899 Other long term (current) drug therapy: Secondary | ICD-10-CM | POA: Insufficient documentation

## 2021-01-17 DIAGNOSIS — R531 Weakness: Secondary | ICD-10-CM | POA: Diagnosis not present

## 2021-01-17 DIAGNOSIS — I2510 Atherosclerotic heart disease of native coronary artery without angina pectoris: Secondary | ICD-10-CM | POA: Diagnosis not present

## 2021-01-17 DIAGNOSIS — Z794 Long term (current) use of insulin: Secondary | ICD-10-CM | POA: Diagnosis not present

## 2021-01-17 LAB — CBC WITH DIFFERENTIAL/PLATELET
Abs Immature Granulocytes: 0.02 10*3/uL (ref 0.00–0.07)
Basophils Absolute: 0 10*3/uL (ref 0.0–0.1)
Basophils Relative: 0 %
Eosinophils Absolute: 0.1 10*3/uL (ref 0.0–0.5)
Eosinophils Relative: 1 %
HCT: 34.6 % — ABNORMAL LOW (ref 36.0–46.0)
Hemoglobin: 10.5 g/dL — ABNORMAL LOW (ref 12.0–15.0)
Immature Granulocytes: 0 %
Lymphocytes Relative: 12 %
Lymphs Abs: 1.2 10*3/uL (ref 0.7–4.0)
MCH: 25.9 pg — ABNORMAL LOW (ref 26.0–34.0)
MCHC: 30.3 g/dL (ref 30.0–36.0)
MCV: 85.2 fL (ref 80.0–100.0)
Monocytes Absolute: 1.1 10*3/uL — ABNORMAL HIGH (ref 0.1–1.0)
Monocytes Relative: 10 %
Neutro Abs: 7.7 10*3/uL (ref 1.7–7.7)
Neutrophils Relative %: 77 %
Platelets: 386 10*3/uL (ref 150–400)
RBC: 4.06 MIL/uL (ref 3.87–5.11)
RDW: 15.3 % (ref 11.5–15.5)
WBC: 10.1 10*3/uL (ref 4.0–10.5)
nRBC: 0 % (ref 0.0–0.2)

## 2021-01-17 LAB — URINALYSIS, ROUTINE W REFLEX MICROSCOPIC
Bilirubin Urine: NEGATIVE
Glucose, UA: 50 mg/dL — AB
Ketones, ur: NEGATIVE mg/dL
Nitrite: NEGATIVE
Protein, ur: 100 mg/dL — AB
RBC / HPF: >50 RBC/hpf — ABNORMAL HIGH (ref 0–5)
Specific Gravity, Urine: 1.009 (ref 1.005–1.030)
WBC, UA: >50 WBC/hpf — ABNORMAL HIGH (ref 0–5)
pH: 6 (ref 5.0–8.0)

## 2021-01-17 LAB — COMPREHENSIVE METABOLIC PANEL
ALT: 15 U/L (ref 0–44)
AST: 14 U/L — ABNORMAL LOW (ref 15–41)
Albumin: 2.9 g/dL — ABNORMAL LOW (ref 3.5–5.0)
Alkaline Phosphatase: 47 U/L (ref 38–126)
Anion gap: 6 (ref 5–15)
BUN: 18 mg/dL (ref 8–23)
CO2: 27 mmol/L (ref 22–32)
Calcium: 10.2 mg/dL (ref 8.9–10.3)
Chloride: 104 mmol/L (ref 98–111)
Creatinine, Ser: 0.57 mg/dL (ref 0.44–1.00)
GFR, Estimated: >60 mL/min (ref >60)
Glucose, Bld: 198 mg/dL — ABNORMAL HIGH (ref 70–99)
Potassium: 3.8 mmol/L (ref 3.5–5.1)
Sodium: 137 mmol/L (ref 135–145)
Total Bilirubin: 0.6 mg/dL (ref 0.3–1.2)
Total Protein: 7.1 g/dL (ref 6.5–8.1)

## 2021-01-17 LAB — CBG MONITORING, ED: Glucose-Capillary: 174 mg/dL — ABNORMAL HIGH (ref 70–99)

## 2021-01-17 LAB — TROPONIN I (HIGH SENSITIVITY)
Troponin I (High Sensitivity): 21 ng/L — ABNORMAL HIGH (ref <18)
Troponin I (High Sensitivity): 24 ng/L — ABNORMAL HIGH (ref <18)

## 2021-01-17 LAB — TSH: TSH: 1.001 u[IU]/mL (ref 0.350–4.500)

## 2021-01-17 LAB — LACTIC ACID, PLASMA: Lactic Acid, Venous: 1.7 mmol/L (ref 0.5–1.9)

## 2021-01-17 MED ORDER — SODIUM CHLORIDE 0.9 % IV BOLUS
500.0000 mL | Freq: Once | INTRAVENOUS | Status: AC
  Administered 2021-01-17: 500 mL via INTRAVENOUS

## 2021-01-17 NOTE — ED Notes (Signed)
Placed external female catheter on patient/ clean/ dry/ intact 

## 2021-01-17 NOTE — ED Notes (Signed)
ED Provider at bedside. 

## 2021-01-17 NOTE — ED Notes (Signed)
Stuck patient for repeat troponin, not successful. IV not pulling back for enough blood for specimen.

## 2021-01-17 NOTE — ED Triage Notes (Signed)
Coming from urology center, patient had a syncopal episode, recent history of 2 months of recurrent UTIs, failure to thrive, family wants her evaluated, normally verbal

## 2021-01-17 NOTE — ED Provider Notes (Signed)
[facility] DEPT Provider Note   CSN: [id number] Arrival date & time: 01/17/21  1328     History Chief Complaint  Patient presents with  . Loss of Consciousness    [name] is a 87 y.o. female.  Level 5 caveat secondary to dementia.  Patient presents by ambulance from urology clinic for possible syncopal event.  Patient is unable to give any history.  On review of prior ED and hospital admissions she has significant dementia and failure to thrive, frequent UTIs.  Patient denies any complaints.  The history is provided by the patient and the EMS personnel.  Near Syncope This is a new problem. The current episode started 1 to 2 hours ago. The problem occurs rarely. The problem has been resolved. Pertinent negatives include no chest pain, no abdominal pain, no headaches and no shortness of breath. Nothing aggravates the symptoms. Nothing relieves the symptoms. She has tried nothing for the symptoms. The treatment provided no relief.       Past Medical History:  Diagnosis Date  . Cancer ([place])    skin  . Coronary artery disease   . Diabetes mellitus   . Diabetic retinopathy   . Dyslipidemia   . Family history of adverse reaction to anesthesia    pts daughter vomits and has very difficult time to awaken  . H/O: CVA (cardiovascular accident)   . Hearing loss   . Heart murmur   . History of kidney stones   . Hypertension   . Stroke [facility]) 03/2020   x5    Patient Active Problem List   Diagnosis Date Noted  . Metabolic encephalopathy 96/78/9381  . DVT (deep venous thrombosis) ([place]) 01/05/2021  . Acute metabolic encephalopathy 01/75/1025  . Atrial fibrillation, chronic ([place]) 12/14/2020  . Anemia 12/14/2020  . Hypokalemia 12/14/2020  . History of CVA (cerebrovascular accident) 12/14/2020  . Pressure injury of skin 11/24/2020  . UTI (urinary tract infection) 11/22/2020  . Debility 11/22/2020  . Dementia ([place]) 11/22/2020  . Abnormal  cardiac valve 11/22/2020  . DM2 (diabetes mellitus, type 2) ([place]) 11/22/2020    Past Surgical History:  Procedure Laterality Date  . CESAREAN SECTION    . CYSTOSCOPY WITH RETROGRADE PYELOGRAM, URETEROSCOPY AND STENT PLACEMENT Right 11/26/2020   Procedure: CYSTOSCOPY WITH RIGHT RETROGRADE PYELOGRAM, RIGHT URETEROSCOPY AND PYELOSCOPY, RIGHT STENT PLACEMENT, FULGERATION OF RIGHT URETER AND BLADDER;  Surgeon: Remi Haggard, MD;  Location: [facility] ORS;  Service: Urology;  Laterality: Right;  . DILATION AND CURETTAGE OF UTERUS    . EYE SURGERY Bilateral   . hysterectomy - unknown type    . MASTOIDECTOMY     as a child     OB History   No obstetric history on file.     Family History  Problem Relation Age of Onset  . Prostate cancer Other     Social History   Tobacco Use  . Smoking status: Never Smoker  . Smokeless tobacco: Never Used  Vaping Use  . Vaping Use: Never used  Substance Use Topics  . Alcohol use: No  . Drug use: Never    Home Medications Prior to Admission medications   Medication Sig Start Date End Date Taking? Authorizing Provider  apixaban (ELIQUIS) 5 MG TABS tablet Take 1 tablet (5 mg total) by mouth 2 (two) times daily. Start after completing the starter pack 02/06/21   Mercy Riding, MD  APIXABAN Arne [place]) VTE STARTER PACK (10MG  AND 5MG ) Take as directed on package: start  with two-5mg  tablets twice daily for 7 days. On day 8, switch to one-5mg  tablet twice daily. 01/07/21   Mercy Riding, MD  atorvastatin (LIPITOR) 40 MG tablet Take 1 tablet (40 mg total) by mouth daily. 12/21/20 03/21/21  Kayleen Memos, DO  CRANBERRY PO Take 1 tablet by mouth 2 (two) times daily.    [provider]  donepezil (ARICEPT) 10 MG tablet Take 10 mg by mouth at bedtime.    [provider]  furosemide (LASIX) 20 MG tablet Take 1 tablet (20 mg total) by mouth daily as needed for fluid or edema (swelling, weight gain >2 lbs). 12/21/20   Kayleen Memos, DO  insulin NPH-insulin  regular (NOVOLIN 70/30) (70-30) 100 UNIT/ML injection Inject 1-10 Units into the skin 2 (two) times daily as needed (high blood sugar). If blood sugar is over 150 takes 1 unit ; sliding scale    [provider]  Insulin Pen Needle (B-D ULTRAFINE III SHORT PEN) 31G X 8 MM MISC See admin instructions. 12/13/20   [provider]  mupirocin ointment (BACTROBAN) 2 % Apply 1 application topically daily as needed (cuts).    [provider]  OXYGEN Inhale 2 L into the lungs at bedtime.    [provider]    Allergies    Codeine  Review of Systems   Review of Systems  Unable to perform ROS: Dementia  Respiratory: Negative for shortness of breath.   Cardiovascular: Positive for syncope and near-syncope. Negative for chest pain.  Gastrointestinal: Negative for abdominal pain.  Neurological: Positive for weakness. Negative for headaches.    Physical Exam Updated Vital Signs BP 129/75   Pulse 74   Temp 98.4 F (36.9 C) (Oral)   Resp 16   SpO2 100%   Physical Exam Vitals and nursing note reviewed.  Constitutional:      General: She is not in acute distress.    Appearance: Normal appearance. She is well-developed.  HENT:     Head: Normocephalic and atraumatic.  Eyes:     Conjunctiva/sclera: Conjunctivae normal.  Cardiovascular:     Rate and Rhythm: Normal rate and regular rhythm.     Heart sounds: Murmur heard.    Pulmonary:     Effort: Pulmonary effort is normal. No respiratory distress.     Breath sounds: Normal breath sounds.  Abdominal:     Palpations: Abdomen is soft.     Tenderness: There is no abdominal tenderness. There is no guarding or rebound.  Musculoskeletal:        General: No deformity. Normal range of motion.     Cervical back: Neck supple.  Skin:    General: Skin is warm and dry.  Neurological:     General: No focal deficit present.     Mental Status: She is alert. She is disoriented.     ED Results / Procedures /  Treatments   Labs (all labs ordered are listed, but only abnormal results are displayed) Labs Reviewed  COMPREHENSIVE METABOLIC PANEL - Abnormal; Notable for the following components:      Result Value   Glucose, Bld 198 (*)    Albumin 2.9 (*)    AST 14 (*)    All other components within normal limits  CBC WITH DIFFERENTIAL/PLATELET - Abnormal; Notable for the following components:   Hemoglobin 10.5 (*)    HCT 34.6 (*)    MCH 25.9 (*)    Monocytes Absolute 1.1 (*)    All other components  within normal limits  URINALYSIS, ROUTINE W REFLEX MICROSCOPIC - Abnormal; Notable for the following components:   APPearance CLOUDY (*)    Glucose, UA 50 (*)    Hgb urine dipstick LARGE (*)    Protein, ur 100 (*)    Leukocytes,Ua LARGE (*)    RBC / HPF >50 (*)    WBC, UA >50 (*)    Bacteria, UA FEW (*)    All other components within normal limits  CBG MONITORING, ED - Abnormal; Notable for the following components:   Glucose-Capillary 174 (*)    All other components within normal limits  TROPONIN I (HIGH SENSITIVITY) - Abnormal; Notable for the following components:   Troponin I (High Sensitivity) 21 (*)    All other components within normal limits  TROPONIN I (HIGH SENSITIVITY) - Abnormal; Notable for the following components:   Troponin I (High Sensitivity) 24 (*)    All other components within normal limits  CULTURE, BLOOD (ROUTINE X 2)  URINE CULTURE  LACTIC ACID, PLASMA  TSH    EKG EKG Interpretation  Date/Time:  Thursday January 17 2021 14:05:05 EDT Ventricular Rate:  75 PR Interval:    QRS Duration: 108 QT Interval:  490 QTC Calculation: 548 R Axis:   -34 Text Interpretation: Atrial flutter with predominant 4:1 AV block LVH with secondary repolarization abnormality Anterior infarct, old Prolonged QT interval Artifact in lead(s) I III aVR aVL aVF V2 No significant change since prior 3/22 Confirmed by Aletta Edouard [phone]) on 01/17/2021 2:16:09 PM   Radiology DG Chest Port  1 View  Result Date: 01/17/2021 CLINICAL DATA:  Syncope.  Weakness. EXAM: PORTABLE CHEST 1 VIEW COMPARISON:  Chest x-ray dated November 22, 2020. FINDINGS: The heart size and mediastinal contours are within normal limits. Both lungs are clear. The visualized skeletal structures are unremarkable. IMPRESSION: No active disease. Electronically Signed   By: Titus Dubin M.D.   On: 01/17/2021 15:06    Procedures Procedures   Medications Ordered in ED Medications  sodium chloride 0.9 % bolus 500 mL (0 mLs Intravenous Stopped 01/17/21 1738)    ED Course  I have reviewed the triage vital signs and the nursing notes.  Pertinent labs & imaging results that were available during my care of the patient were reviewed by me and considered in my medical decision making (see chart for details).  Clinical Course as of 01/18/21 1018  Thu Jan 17, 2021  1450 Chest x-ray interpreted by me as no acute infiltrates. [MB]  28 Patient's daughter is here now and is able to give a little bit more history.  It sounds like she was taking her to a urology appointment today but she was acutely weak and could not support herself so the daughter helped her into a wheelchair.  She does not think she went unconscious. [MB]  3267 Patient's urinalysis came back greater than 50 reds greater than 50 whites but only few bacteria nitrite negative.  I reviewed her prior urinalysis and these look similar.  Those have been culture negative. [MB]  1838 I reviewed the work-up with [MB]  1838  patient's daughter.  She said she would rather take her home as she tends to decline when she comes into the hospital.  Recommended close follow-up with her primary care doctor and urologist.  Return instructions discussed [MB]    Clinical Course User Index [MB] Hayden Rasmussen, MD   MDM Rules/Calculators/A&P  This patient complains of near syncope, generalized weakness; this involves an extensive number of  treatment Options and is a complaint that carries with it a high risk of complications and Morbidity. The differential includes dehydration, renal failure, metabolic derangement, arrhythmia, infection, UTI  I ordered, reviewed and interpreted labs, which included CBC with normal white count, hemoglobin low but stable from priors, chemistries fairly normal other than elevated glucose, urinalysis with possible signs of infection greater than 50 whites and red but only few bacteria sent for culture, lactic acid normal, TSH normal, troponins flat I ordered medication IV fluids I ordered imaging studies which included chest x-ray and I independently    visualized and interpreted imaging which showed no acute disease Additional history obtained from patient's daughter Previous records obtained and reviewed in epic including prior admissions and recent infectious disease consult  After the interventions stated above, I reevaluated the patient and found patient to be asymptomatic.  Daughter states she is at her baseline.  She was offered admission for continued work-up.  Daughter declines admission and will follow up with her providers.  Return instructions discussed   Final Clinical Impression(s) / ED Diagnoses Final diagnoses:  Near syncope  Generalized weakness    Rx / DC Orders ED Discharge Orders    None       Hayden Rasmussen, MD 01/18/21 1021

## 2021-01-17 NOTE — ED Notes (Signed)
Stuck patient for second blood culture set but unsuccessful.

## 2021-01-17 NOTE — Discharge Instructions (Addendum)
You were seen in the emergency department for evaluation of a near fainting spell while visiting the urologist.  You had blood work EKG chest x-ray and a urinalysis that did not show an obvious explanation for your symptoms.  There was possible signs of urine infection and urine culture was sent.  Please follow-up with your primary care doctor and urologist.  Return to the emergency department for any worsening or concerning symptoms.

## 2021-01-19 LAB — URINE CULTURE: Culture: <10000 — AB

## 2021-01-22 LAB — CULTURE, BLOOD (ROUTINE X 2): Culture: NO GROWTH

## 2021-01-23 ENCOUNTER — Other Ambulatory Visit: Payer: Self-pay

## 2021-01-23 ENCOUNTER — Other Ambulatory Visit: Payer: Self-pay | Admitting: Hospice

## 2021-01-23 DIAGNOSIS — R531 Weakness: Secondary | ICD-10-CM

## 2021-01-23 DIAGNOSIS — Z515 Encounter for palliative care: Secondary | ICD-10-CM

## 2021-01-23 NOTE — Progress Notes (Addendum)
PATIENT NAME: [name] [address] [place] [address] [phone] (home)  DOB: 07-29-1929 MRN: [id number]  PRIMARY CARE PROVIDER:    Vicenta Aly, [place],  [place] [address] [phone] Daughter in the process of switching providers for patient.   REFERRING PROVIDER:   Vicenta Aly, [place],  [place] [address] [phone]  RESPONSIBLE PARTY:  [name] [phone]  CHIEF COMPLAIN: Initial palliative care visit/weakness  Visit is to build trust and highlight Palliative Medicine as specialized medical care for people living with serious illness, aimed at facilitating better quality of life through symptoms relief, assisting with advance care plan and establishing goals of care. [name] is present with patient during visit. Discussion on the difference between Palliative and Hospice care.  Visit consisted of counseling and education dealing with the complex and emotionally intense issues of symptom management and palliative care in the setting of serious and potentially life-threatening illness. Palliative care team will continue to support patient, patient's family, and medical team.  RECOMMENDATIONS/PLAN:   Advance Care Planning: Our advance care planning conversation included a discussion about  the value and importance of advance care planning, exploration of goals of care in the event of a sudden injury or illness, identification and preparation of a healthcare agent and review and updating or creation of an advance directive document. Provided general support, encouragement and ample emotional support.  CODE STATUS: Patient is a Do Not Resuscitate. Signed DNR in Epic.   GOALS OF CARE: Goals of care include to maximize quality of life and symptom management. [name] stressed quality of life over quantity of life. Family is interested in hospice service in the future when  patient qualifies for it. Gave Catherine MOST form for discussion next visit.  I spent 46 minutes providing this initial consultation. More than 50% of the time in this consultation was spent on counseling patient and coordinating communication. -------------------------------------------------------------------------------------------------------------------------------------- Symptom Management/Plan:  Weakness: Ongoing generalized body weakness. Continue PT/OT/ST.  [name] reports patient's appetite is good. Ensure adequate oral intake.  Type 2 DM. Last A1c March 2022 was 7.6. Continue Insulin as ordered, diabetic diet; no concentrated sweets.  Recurring UTI: Discussed  hygiene care and correct wiping method from front to back, encourage  adequate hydration. Patient is followed by a Urologist. Urologist consult as needed.  Preventive measures for sacral wound discussed: reposition often to prevent pressure, gel cushion for wheel chair, low air loss  alternating pressure mattress. Encourage adequate oral intake/nutrition. Palliative will continue to monitor for symptom management/decline and make recommendations as needed. Return 6 weeks or prn. Encouraged to call provider sooner with any concerns.   PPS: 40%  HISTORY OF PRESENT ILLNESS:  Rebecca Pruitt is a 87 y.o. female with multiple medical problems including generalized body weakness which is chronic but worsened in the past month; last seen in the ED 01/17/2021 for syncope/generalized body weakness. Patient no longer able to be active as before, ambulation has decreased from 100 feet two months ago to needing assistance for transfers,  per daughter, and this impairs her activities of daily living. Four hospitalizations in the last 3 months for recurring UTI and DVT in left leg. Patient was followed by Infectious disease in the past who recommended no antibiotics until urine culture shows growth. History of Type 2 DM, CAD, CVA, aortic  stenosis, stroke, Vascular Dementia - FAST 6C. History obtained from review of EMR, discussion with patient/family.  Review and summarization of Epic records shows history from other than patient. Rest of 10 point ROS asked and negative.  Palliative Care was asked to follow this patient by consultation request of Vicenta Aly, FNP to help address complex decision making in the context of advance care planning and goals of care clarification.    HOSPICE ELIGIBILITY/DIAGNOSIS: TBD  PAST MEDICAL HISTORY:  Past Medical History:  Diagnosis Date  . Cancer ([place])    skin  . Coronary artery disease   . Diabetes mellitus   . Diabetic retinopathy   . Dyslipidemia   . Family history of adverse reaction to anesthesia    pts daughter vomits and has very difficult time to awaken  . H/O: CVA (cardiovascular accident)   . Hearing loss   . Heart murmur   . History of kidney stones   . Hypertension   . Stroke [facility]) 03/2020   x5     SOCIAL HX: Patient lives at home with daughter and her family.   Social History   Tobacco Use  . Smoking status: Never Smoker  . Smokeless tobacco: Never Used  Substance Use Topics  . Alcohol use: No     FAMILY HX:  Family History  Problem Relation Age of Onset  . Prostate cancer Other     Review of lab tests/diagnostics   Recent Labs  Lab 01/17/21 1425  WBC 10.1  HGB 10.5*  HCT 34.6*  PLT 386  MCV 85.2   Recent Labs  Lab 01/17/21 1425  NA 137  K 3.8  CL 104  CO2 27  BUN 18  CREATININE 0.57  GLUCOSE 198*   Latest GFR by Cockcroft Gault (not valid in AKI or ESRD) CrCl cannot be calculated (Unknown ideal weight.). Recent Labs  Lab 01/17/21 1425  AST 14*  ALT 15  ALKPHOS 47   No components found for: ALB No results for input(s): APTT, INR in the last 168 hours.  Invalid input(s): PTPATIENT No results for input(s): BNP, PROBNP in the last 168 hours.  ALLERGIES:  Allergies  Allergen Reactions  . Codeine     Syncope        PERTINENT MEDICATIONS:  Outpatient Encounter Medications as of 01/23/2021  Medication Sig  . [START ON 02/06/2021] apixaban (ELIQUIS) 5 MG TABS tablet Take 1 tablet (5 mg total) by mouth 2 (two) times daily. Start after completing the starter pack  . APIXABAN (ELIQUIS) VTE STARTER PACK (10MG  AND 5MG ) Take as directed on package: start with two-5mg  tablets twice daily for 7 days. On day 8, switch to one-5mg  tablet twice daily. (Patient not taking: Reported on 01/17/2021)  . atorvastatin (LIPITOR) 40 MG tablet Take 1 tablet (40 mg total) by mouth daily.  Marland Kitchen CRANBERRY PO Take 1 tablet by mouth 2 (two) times daily.  Marland Kitchen donepezil (ARICEPT) 10 MG tablet Take 10 mg by mouth at bedtime.  . furosemide (LASIX) 20 MG tablet Take 1 tablet (20 mg total) by mouth daily as needed for fluid or edema (swelling, weight gain >2 lbs). (Patient taking differently: Take 10-20 mg by mouth daily as needed for fluid or edema (swelling, weight gain >2 lbs).)  . insulin NPH-insulin regular (NOVOLIN 70/30) (70-30) 100 UNIT/ML injection Inject 1-10 Units into the skin 2 (two) times daily as needed (high blood sugar). If blood sugar is over 150 takes 1 unit ; sliding scale  . Insulin Pen Needle (B-D ULTRAFINE III SHORT PEN) 31G X 8 MM MISC See admin instructions.  . melatonin (  [employer] MELATONIN) 3 MG TABS tablet Take 3 mg by mouth at bedtime.  . mupirocin ointment (BACTROBAN) 2 % Apply 1 application topically daily as needed (cuts).  . OXYGEN Inhale 2 L into the lungs at bedtime.   No facility-administered encounter medications on file as of 01/23/2021.   ROS General: NAD EYES: Legally blind in both eyes, per [place].  ENMT: No dysphagia no xerostomia Cardiovascular: No chest pain Pulmonary: No cough, SOB  Abdomen:no constipation or diarrhea GU: No dysuria or urinary frequency MSK:   ROM limitations, no falls reported Skin: occasional redness to sacral area Neurological: weakness Psych:  positive mood Heme/lymph/immuno: No  bruises or abnormal bleeding   PHYSICAL EXAM   Constitutional: In no acute distress, hard of hearing Cardiovascular: regular rate and rhythm; mild non pitting edema in LLE Pulmonary: no cough, no increased work of breathing, normal respiratory effort on room air Abdomen: soft, non tender, positive bowel sounds in all quadrants GU:  no suprapubic tenderness Eyes: Normal lids, no discharge, sclera anicteric ENMT: Moist mucous membranes, Musculoskeletal:  Weakness, moderate sarcopenia Skin:  dry skin, tears easily, healing bruises to bil forearms, warm without cyanosis Psych: non-anxious affect Neurological: Weakness but otherwise non focal Heme/lymph/immuno: no bruises, no bleeding  Thank you for the opportunity to participate in the care of [place] Please call our office at [phone] if we can be of additional assistance.  Note: Portions of this note were generated with [employer]. Dictation errors may occur despite best attempts at proofreading.  Teodoro Spray, NP

## 2021-01-24 ENCOUNTER — Emergency Department (HOSPITAL_COMMUNITY): Payer: Medicare Other

## 2021-01-24 ENCOUNTER — Encounter (HOSPITAL_COMMUNITY): Payer: Self-pay | Admitting: Emergency Medicine

## 2021-01-24 ENCOUNTER — Emergency Department (HOSPITAL_COMMUNITY)
Admission: EM | Admit: 2021-01-24 | Discharge: 2021-01-24 | Disposition: A | Discharge status: Home / Self Care | Payer: Medicare Other | Source: Home / Self Care | Attending: Emergency Medicine | Admitting: Emergency Medicine

## 2021-01-24 DIAGNOSIS — I2510 Atherosclerotic heart disease of native coronary artery without angina pectoris: Secondary | ICD-10-CM | POA: Insufficient documentation

## 2021-01-24 DIAGNOSIS — E11319 Type 2 diabetes mellitus with unspecified diabetic retinopathy without macular edema: Secondary | ICD-10-CM | POA: Insufficient documentation

## 2021-01-24 DIAGNOSIS — Z85828 Personal history of other malignant neoplasm of skin: Secondary | ICD-10-CM | POA: Insufficient documentation

## 2021-01-24 DIAGNOSIS — F0390 Unspecified dementia without behavioral disturbance: Secondary | ICD-10-CM

## 2021-01-24 DIAGNOSIS — R5383 Other fatigue: Secondary | ICD-10-CM | POA: Insufficient documentation

## 2021-01-24 DIAGNOSIS — Z951 Presence of aortocoronary bypass graft: Secondary | ICD-10-CM | POA: Insufficient documentation

## 2021-01-24 DIAGNOSIS — I700 Atherosclerosis of aorta: Secondary | ICD-10-CM | POA: Insufficient documentation

## 2021-01-24 DIAGNOSIS — Z794 Long term (current) use of insulin: Secondary | ICD-10-CM | POA: Insufficient documentation

## 2021-01-24 DIAGNOSIS — I10 Essential (primary) hypertension: Secondary | ICD-10-CM | POA: Insufficient documentation

## 2021-01-24 DIAGNOSIS — Z8673 Personal history of transient ischemic attack (TIA), and cerebral infarction without residual deficits: Secondary | ICD-10-CM | POA: Insufficient documentation

## 2021-01-24 DIAGNOSIS — N390 Urinary tract infection, site not specified: Secondary | ICD-10-CM | POA: Diagnosis not present

## 2021-01-24 DIAGNOSIS — X58XXXA Exposure to other specified factors, initial encounter: Secondary | ICD-10-CM | POA: Insufficient documentation

## 2021-01-24 DIAGNOSIS — R531 Weakness: Secondary | ICD-10-CM

## 2021-01-24 DIAGNOSIS — Z7901 Long term (current) use of anticoagulants: Secondary | ICD-10-CM | POA: Insufficient documentation

## 2021-01-24 DIAGNOSIS — S51812A Laceration without foreign body of left forearm, initial encounter: Secondary | ICD-10-CM | POA: Insufficient documentation

## 2021-01-24 DIAGNOSIS — S8011XA Contusion of right lower leg, initial encounter: Secondary | ICD-10-CM | POA: Insufficient documentation

## 2021-01-24 DIAGNOSIS — Z20822 Contact with and (suspected) exposure to covid-19: Secondary | ICD-10-CM | POA: Insufficient documentation

## 2021-01-24 DIAGNOSIS — A419 Sepsis, unspecified organism: Secondary | ICD-10-CM | POA: Diagnosis not present

## 2021-01-24 DIAGNOSIS — Z79899 Other long term (current) drug therapy: Secondary | ICD-10-CM | POA: Insufficient documentation

## 2021-01-24 LAB — COMPREHENSIVE METABOLIC PANEL
ALT: 15 U/L (ref 0–44)
AST: 21 U/L (ref 15–41)
Albumin: 2.8 g/dL — ABNORMAL LOW (ref 3.5–5.0)
Alkaline Phosphatase: 47 U/L (ref 38–126)
Anion gap: 8 (ref 5–15)
BUN: 15 mg/dL (ref 8–23)
CO2: 23 mmol/L (ref 22–32)
Calcium: 9.6 mg/dL (ref 8.9–10.3)
Chloride: 107 mmol/L (ref 98–111)
Creatinine, Ser: 0.66 mg/dL (ref 0.44–1.00)
GFR, Estimated: >60 mL/min (ref >60)
Glucose, Bld: 147 mg/dL — ABNORMAL HIGH (ref 70–99)
Potassium: 3.4 mmol/L — ABNORMAL LOW (ref 3.5–5.1)
Sodium: 138 mmol/L (ref 135–145)
Total Bilirubin: 0.9 mg/dL (ref 0.3–1.2)
Total Protein: 7 g/dL (ref 6.5–8.1)

## 2021-01-24 LAB — CBC WITH DIFFERENTIAL/PLATELET
Abs Immature Granulocytes: 0.02 10*3/uL (ref 0.00–0.07)
Basophils Absolute: 0 10*3/uL (ref 0.0–0.1)
Basophils Relative: 0 %
Eosinophils Absolute: 0.1 10*3/uL (ref 0.0–0.5)
Eosinophils Relative: 1 %
HCT: 31.1 % — ABNORMAL LOW (ref 36.0–46.0)
Hemoglobin: 9.3 g/dL — ABNORMAL LOW (ref 12.0–15.0)
Immature Granulocytes: 0 %
Lymphocytes Relative: 14 %
Lymphs Abs: 1 10*3/uL (ref 0.7–4.0)
MCH: 25.6 pg — ABNORMAL LOW (ref 26.0–34.0)
MCHC: 29.9 g/dL — ABNORMAL LOW (ref 30.0–36.0)
MCV: 85.7 fL (ref 80.0–100.0)
Monocytes Absolute: 0.9 10*3/uL (ref 0.1–1.0)
Monocytes Relative: 12 %
Neutro Abs: 5.6 10*3/uL (ref 1.7–7.7)
Neutrophils Relative %: 73 %
Platelets: 302 10*3/uL (ref 150–400)
RBC: 3.63 MIL/uL — ABNORMAL LOW (ref 3.87–5.11)
RDW: 15.9 % — ABNORMAL HIGH (ref 11.5–15.5)
WBC: 7.6 10*3/uL (ref 4.0–10.5)
nRBC: 0 % (ref 0.0–0.2)

## 2021-01-24 LAB — URINALYSIS, ROUTINE W REFLEX MICROSCOPIC
Bacteria, UA: NONE SEEN
Bilirubin Urine: NEGATIVE
Glucose, UA: NEGATIVE mg/dL
Ketones, ur: NEGATIVE mg/dL
Nitrite: NEGATIVE
Protein, ur: 100 mg/dL — AB
RBC / HPF: >50 RBC/hpf — ABNORMAL HIGH (ref 0–5)
Specific Gravity, Urine: 1.01 (ref 1.005–1.030)
WBC, UA: >50 WBC/hpf — ABNORMAL HIGH (ref 0–5)
pH: 6 (ref 5.0–8.0)

## 2021-01-24 LAB — RESP PANEL BY RT-PCR (FLU A&B, COVID) ARPGX2
Influenza A by PCR: NEGATIVE
Influenza B by PCR: NEGATIVE
SARS Coronavirus 2 by RT PCR: NEGATIVE

## 2021-01-24 LAB — TSH: TSH: 0.981 u[IU]/mL (ref 0.350–4.500)

## 2021-01-24 MED ORDER — IOHEXOL 300 MG/ML  SOLN
75.0000 mL | Freq: Once | INTRAMUSCULAR | Status: AC | PRN
  Administered 2021-01-24: 75 mL via INTRAVENOUS

## 2021-01-24 NOTE — ED Notes (Signed)
Pt daughter verbalized understanding of d/cand follow up care.

## 2021-01-24 NOTE — ED Provider Notes (Signed)
[facility] DEPT Provider Note   CSN: [id number] Arrival date & time: 01/24/21  1446     History Chief Complaint  Patient presents with  . Fatigue    Rebecca Pruitt is a 87 y.o. female.  87yo F w/ PMH including dementia, CAD, T2DM, HTN, CVA, ureteral stent who p/w fatigue and functional decline.  Daughter reports that the patient has had a functional decline over the past 2 months with recurrent episodes of urinary tract infections and multiple hospitalizations for the same.  She has been following with urology as well as infectious diseases but daughter does not feel like they are getting to the bottom of what's going on. She has been more fatigued and weaker today and has had a slight cough since yesterday.  LEVEL 5 CAVEAT DUE TO DEMENTIA  The history is provided by a relative.       Past Medical History:  Diagnosis Date  . Cancer ([place])    skin  . Coronary artery disease   . Diabetes mellitus   . Diabetic retinopathy   . Dyslipidemia   . Family history of adverse reaction to anesthesia    pts daughter vomits and has very difficult time to awaken  . H/O: CVA (cardiovascular accident)   . Hearing loss   . Heart murmur   . History of kidney stones   . Hypertension   . Stroke [facility]) 03/2020   x5    Patient Active Problem List   Diagnosis Date Noted  . Metabolic encephalopathy 40/98/1191  . DVT (deep venous thrombosis) ([place]) 01/05/2021  . Acute metabolic encephalopathy 47/82/9562  . Atrial fibrillation, chronic ([place]) 12/14/2020  . Anemia 12/14/2020  . Hypokalemia 12/14/2020  . History of CVA (cerebrovascular accident) 12/14/2020  . Pressure injury of skin 11/24/2020  . UTI (urinary tract infection) 11/22/2020  . Debility 11/22/2020  . Dementia ([place]) 11/22/2020  . Abnormal cardiac valve 11/22/2020  . DM2 (diabetes mellitus, type 2) ([place]) 11/22/2020    Past Surgical History:  Procedure Laterality Date  . CESAREAN SECTION    .  CYSTOSCOPY WITH RETROGRADE PYELOGRAM, URETEROSCOPY AND STENT PLACEMENT Right 11/26/2020   Procedure: CYSTOSCOPY WITH RIGHT RETROGRADE PYELOGRAM, RIGHT URETEROSCOPY AND PYELOSCOPY, RIGHT STENT PLACEMENT, FULGERATION OF RIGHT URETER AND BLADDER;  Surgeon: Remi Haggard, MD;  Location: [facility];  Service: Urology;  Laterality: Right;  . DILATION AND CURETTAGE OF UTERUS    . EYE SURGERY Bilateral   . hysterectomy - unknown type    . MASTOIDECTOMY     as a child     OB History   No obstetric history on file.     Family History  Problem Relation Age of Onset  . Prostate cancer Other     Social History   Tobacco Use  . Smoking status: Never Smoker  . Smokeless tobacco: Never Used  Vaping Use  . Vaping Use: Never used  Substance Use Topics  . Alcohol use: No  . Drug use: Never    Home Medications Prior to Admission medications   Medication Sig Start Date End Date Taking? Authorizing Provider  apixaban (ELIQUIS) 5 MG TABS tablet Take 1 tablet (5 mg total) by mouth 2 (two) times daily. Start after completing the starter pack Patient taking differently: Take 5 mg by mouth 2 (two) times daily. 02/06/21  Yes Mercy Riding, MD  atorvastatin (LIPITOR) 40 MG tablet Take 1 tablet (40 mg total) by mouth daily. 12/21/20 03/21/21 Yes Kayleen Memos, DO  CRANBERRY PO Take 1 tablet by mouth 2 (two) times daily.   Yes [provider]  donepezil (ARICEPT) 10 MG tablet Take 10 mg by mouth at bedtime.   Yes [provider]  furosemide (LASIX) 20 MG tablet Take 1 tablet (20 mg total) by mouth daily as needed for fluid or edema (swelling, weight gain >2 lbs). Patient taking differently: Take 10 mg by mouth daily as needed for fluid or edema (swelling, weight gain >2 lbs). 12/21/20  Yes Hall, Carole N, DO  insulin NPH-insulin regular (NOVOLIN 70/30) (70-30) 100 UNIT/ML injection Inject 1-10 Units into the skin 3 (three) times daily as needed (high blood sugar). If blood sugar is over 150 takes  1 unit ; sliding scale   Yes [provider]  melatonin 3 MG TABS tablet Take 3 mg by mouth at bedtime.   Yes [provider]  mupirocin ointment (BACTROBAN) 2 % Apply 1 application topically daily as needed (cuts).   Yes [provider]  OXYGEN Inhale 2 L into the lungs at bedtime.   Yes [provider]  APIXABAN Arne Cleveland) VTE STARTER PACK (10MG  AND 5MG ) Take as directed on package: start with two-5mg  tablets twice daily for 7 days. On day 8, switch to one-5mg  tablet twice daily. Patient not taking: No sig reported 01/07/21   Mercy Riding, MD  Insulin Pen Needle (B-D ULTRAFINE III SHORT PEN) 31G X 8 MM MISC See admin instructions. 12/13/20   [provider]    Allergies    Codeine  Review of Systems   Review of Systems  Unable to perform ROS: Dementia    Physical Exam Updated Vital Signs BP (!) 166/71   Pulse 73   Temp 97.9 F (36.6 C) (Axillary)   Resp 16   SpO2 100%   Physical Exam Vitals and nursing note reviewed.  Constitutional:      General: She is not in acute distress.    Appearance: Normal appearance.     Comments: Frail, chronically ill appearing  HENT:     Head: Normocephalic and atraumatic.  Eyes:     Conjunctiva/sclera: Conjunctivae normal.  Cardiovascular:     Rate and Rhythm: Normal rate and regular rhythm.     Heart sounds: Normal heart sounds.  Pulmonary:     Effort: Pulmonary effort is normal.     Breath sounds: Normal breath sounds.  Abdominal:     General: Abdomen is flat. Bowel sounds are normal. There is no distension.     Palpations: Abdomen is soft.     Tenderness: There is no abdominal tenderness.  Skin:    General: Skin is warm and dry.     Comments: Contusion w/ hematoma R mid-shin; ecchymoses and skin tear L forearm; stage I sacral decubitus ulcer   Neurological:     Mental Status: She is alert. Mental status is at baseline.     Comments: fluent  Psychiatric:        Cognition and Memory:  Cognition is impaired. Memory is impaired.     ED Results / Procedures / Treatments   Labs (all labs ordered are listed, but only abnormal results are displayed) Labs Reviewed  COMPREHENSIVE METABOLIC PANEL - Abnormal; Notable for the following components:      Result Value   Potassium 3.4 (*)    Glucose, Bld 147 (*)    Albumin 2.8 (*)    All other components within normal limits  CBC WITH DIFFERENTIAL/PLATELET - Abnormal; Notable for the  following components:   RBC 3.63 (*)    Hemoglobin 9.3 (*)    HCT 31.1 (*)    MCH 25.6 (*)    MCHC 29.9 (*)    RDW 15.9 (*)    All other components within normal limits  URINALYSIS, ROUTINE W REFLEX MICROSCOPIC - Abnormal; Notable for the following components:   APPearance TURBID (*)    Hgb urine dipstick LARGE (*)    Protein, ur 100 (*)    Leukocytes,Ua MODERATE (*)    RBC / HPF >50 (*)    WBC, UA >50 (*)    All other components within normal limits  RESP PANEL BY RT-PCR (FLU A&B, COVID) ARPGX2  URINE CULTURE  TSH    EKG EKG Interpretation  Date/Time:  Thursday January 24 2021 15:59:35 EDT Ventricular Rate:  74 PR Interval:  164 QRS Duration: 101 QT Interval:  390 QTC Calculation: 433 R Axis:   -47 Text Interpretation: Multiple ventricular premature complexes LVH with secondary repolarization abnormality Inferior infarct, old Anterior infarct, old A fluttersimilar to previous PVCs new Confirmed by Theotis Burrow [phone]) on 01/24/2021 4:29:57 PM   Radiology DG Chest 1 View  Result Date: 01/24/2021 CLINICAL DATA:  87 year old female with cough. EXAM: CHEST  1 VIEW COMPARISON:  Chest radiograph dated 01/17/2021. FINDINGS: Shallow inspiration. There is mild diffuse interstitial prominence. An area of apparent opacity over the right mid lung field may be artifactual or represent developing infiltrate or atelectasis. PA and lateral views of the chest may provide better evaluation. No pleural effusion pneumothorax. Stable cardiomegaly.  Atherosclerotic calcification of the aorta. No acute osseous pathology. IMPRESSION: Possible developing infiltrate or atelectasis over the right mid lung field. PA and lateral views of the chest may provide better evaluation. Electronically Signed   By: Anner Crete M.D.   On: 01/24/2021 16:07   DG Tibia/Fibula Right  Result Date: 01/24/2021 CLINICAL DATA:  Bruising and swelling of shin EXAM: RIGHT TIBIA AND FIBULA - 2 VIEW COMPARISON:  None. FINDINGS: No acute bony abnormality. Specifically, no fracture, subluxation, or dislocation. Mild degenerative changes in the right knee most notable in the lateral compartment. Vascular calcifications. IMPRESSION: No acute bony abnormality. Electronically Signed   By: Rolm Baptise M.D.   On: 01/24/2021 17:36   CT Chest W Contrast  Result Date: 01/24/2021 CLINICAL DATA:  Cough, persistent possible pneumonia.  Fatigue EXAM: CT CHEST WITH CONTRAST TECHNIQUE: Multidetector CT imaging of the chest was performed during intravenous contrast administration. CONTRAST:  50mL OMNIPAQUE IOHEXOL 300 MG/ML  SOLN COMPARISON:  Chest x-ray 01/24/2021 FINDINGS: Cardiovascular: Normal heart size. At least trace volume pericardial effusion. The thoracic aorta is normal in caliber. Moderate atherosclerotic plaque of the thoracic aorta. Mild to moderate four-vessel coronary artery calcifications. Severe mitral annular calcifications. The main pulmonary artery is normal in caliber. No central pulmonary embolus. Mediastinum/Nodes: No enlarged mediastinal, hilar, or axillary lymph nodes. Subcentimeter hypodensities of the thyroid gland. Otherwise the thyroid gland, trachea, and esophagus demonstrate no significant findings. Lungs/Pleura: Passive atelectasis of the right lower lobe. No focal consolidation. No pulmonary nodule. No pulmonary mass. Trace right pleural effusion. No left pleural effusion. No pneumothorax Upper Abdomen: No acute abnormality. Musculoskeletal: No abdominal wall  hernia or abnormality. No suspicious lytic or blastic osseous lesions. No acute displaced fracture. Multilevel degenerative changes of the spine. IMPRESSION: 1. Trace right pleural effusion. 2. Severe mitral annular calcifications. 3. Aortic Atherosclerosis (ICD10-I70.0) including four-vessel coronary calcifications. Electronically Signed   By: Clelia Croft.D.  On: 01/24/2021 19:06    Procedures Procedures   Medications Ordered in ED Medications  iohexol (OMNIPAQUE) 300 MG/ML solution 75 mL (75 mLs Intravenous Contrast Given 01/24/21 1849)    ED Course  I have reviewed the triage vital signs and the nursing notes.  Pertinent labs & imaging results that were available during my care of the patient were reviewed by me and considered in my medical decision making (see chart for details).    MDM Rules/Calculators/A&P                          Alert, afebrile, reassuring VS on exam.  Lab work shows stable anemia, reassuring CMP, normal WBC count, negative Covid, UA similar to previous with blood and WBCs likely secondary to her stent.  Previous urine culture grew out no bacteria.  Have ordered repeat culture.  Chest x-ray with questionable area on right lung, CT negative for focal infiltrate.  I spent quite a while reviewing the patient's chart including recent infectious disease evaluation. Dr. Juleen China was suspicious that recent possible UTIs didn't actually represent UTI and was not keen on continuing antibiotics chronically. Given that her urine today looks similar to previous and WBC/lactate/vitals are reassuring, I feel it is reasonable to hold off on abx for now and have sent culture. I have also messaged her urologist, Dr. Milford Cage.  Daughter continues to express frustration that the patient seems to get better on antibiotics and then get weak and decline again off of antibiotics.  She stated "I just think they are missing something" and "something else must be going on." I encouraged her  to continue to follow along w/ urology and PCP regarding her symptoms but I explained that I do not feel she needs admission or further testing in the ED.  Final Clinical Impression(s) / ED Diagnoses Final diagnoses:  Weakness  Dementia without behavioral disturbance, unspecified dementia type [facility])    Rx / DC Orders ED Discharge Orders    None       Burman Bruington, Wenda Overland, MD 01/24/21 2250

## 2021-01-24 NOTE — ED Triage Notes (Signed)
Coming from home-decline in health for about 2 months-recurrent infections-clears with antibiotics-daughter want her placed on daily antibiotic to avoid future infections-patient at baseline mentally, oriented x1-slight cough since yesterday

## 2021-01-24 NOTE — ED Notes (Signed)
Pt transported to xray 

## 2021-01-26 ENCOUNTER — Inpatient Hospital Stay (HOSPITAL_COMMUNITY)
Admission: EM | Admit: 2021-01-26 | Discharge: 2021-02-02 | DRG: 871 | Disposition: A | Discharge status: Home / Self Care | Payer: Medicare Other | Attending: Internal Medicine | Admitting: Internal Medicine

## 2021-01-26 ENCOUNTER — Emergency Department (HOSPITAL_COMMUNITY): Payer: Medicare Other

## 2021-01-26 ENCOUNTER — Other Ambulatory Visit: Payer: Self-pay

## 2021-01-26 ENCOUNTER — Encounter (HOSPITAL_COMMUNITY): Payer: Self-pay | Admitting: Emergency Medicine

## 2021-01-26 DIAGNOSIS — N1370 Vesicoureteral-reflux, unspecified: Secondary | ICD-10-CM | POA: Diagnosis present

## 2021-01-26 DIAGNOSIS — N179 Acute kidney failure, unspecified: Secondary | ICD-10-CM | POA: Diagnosis present

## 2021-01-26 DIAGNOSIS — R6520 Severe sepsis without septic shock: Secondary | ICD-10-CM | POA: Diagnosis present

## 2021-01-26 DIAGNOSIS — Z87442 Personal history of urinary calculi: Secondary | ICD-10-CM | POA: Diagnosis not present

## 2021-01-26 DIAGNOSIS — S20211A Contusion of right front wall of thorax, initial encounter: Secondary | ICD-10-CM | POA: Diagnosis present

## 2021-01-26 DIAGNOSIS — Z681 Body mass index (BMI) 19 or less, adult: Secondary | ICD-10-CM

## 2021-01-26 DIAGNOSIS — N6489 Other specified disorders of breast: Secondary | ICD-10-CM | POA: Diagnosis not present

## 2021-01-26 DIAGNOSIS — A419 Sepsis, unspecified organism: Principal | ICD-10-CM | POA: Diagnosis present

## 2021-01-26 DIAGNOSIS — H547 Unspecified visual loss: Secondary | ICD-10-CM | POA: Diagnosis present

## 2021-01-26 DIAGNOSIS — E785 Hyperlipidemia, unspecified: Secondary | ICD-10-CM | POA: Diagnosis present

## 2021-01-26 DIAGNOSIS — Z515 Encounter for palliative care: Secondary | ICD-10-CM | POA: Diagnosis not present

## 2021-01-26 DIAGNOSIS — Z794 Long term (current) use of insulin: Secondary | ICD-10-CM | POA: Diagnosis not present

## 2021-01-26 DIAGNOSIS — X58XXXA Exposure to other specified factors, initial encounter: Secondary | ICD-10-CM | POA: Diagnosis present

## 2021-01-26 DIAGNOSIS — Z7401 Bed confinement status: Secondary | ICD-10-CM

## 2021-01-26 DIAGNOSIS — Z20822 Contact with and (suspected) exposure to covid-19: Secondary | ICD-10-CM | POA: Diagnosis present

## 2021-01-26 DIAGNOSIS — I10 Essential (primary) hypertension: Secondary | ICD-10-CM | POA: Diagnosis present

## 2021-01-26 DIAGNOSIS — D62 Acute posthemorrhagic anemia: Secondary | ICD-10-CM | POA: Diagnosis present

## 2021-01-26 DIAGNOSIS — F0390 Unspecified dementia without behavioral disturbance: Secondary | ICD-10-CM | POA: Diagnosis present

## 2021-01-26 DIAGNOSIS — I2510 Atherosclerotic heart disease of native coronary artery without angina pectoris: Secondary | ICD-10-CM | POA: Diagnosis present

## 2021-01-26 DIAGNOSIS — R4182 Altered mental status, unspecified: Secondary | ICD-10-CM

## 2021-01-26 DIAGNOSIS — Z87440 Personal history of urinary (tract) infections: Secondary | ICD-10-CM | POA: Diagnosis not present

## 2021-01-26 DIAGNOSIS — F0391 Unspecified dementia with behavioral disturbance: Secondary | ICD-10-CM | POA: Diagnosis not present

## 2021-01-26 DIAGNOSIS — R627 Adult failure to thrive: Secondary | ICD-10-CM | POA: Diagnosis not present

## 2021-01-26 DIAGNOSIS — H9190 Unspecified hearing loss, unspecified ear: Secondary | ICD-10-CM | POA: Diagnosis present

## 2021-01-26 DIAGNOSIS — E872 Acidosis: Secondary | ICD-10-CM | POA: Diagnosis present

## 2021-01-26 DIAGNOSIS — N390 Urinary tract infection, site not specified: Secondary | ICD-10-CM | POA: Diagnosis present

## 2021-01-26 DIAGNOSIS — Z7901 Long term (current) use of anticoagulants: Secondary | ICD-10-CM | POA: Diagnosis not present

## 2021-01-26 DIAGNOSIS — N3001 Acute cystitis with hematuria: Secondary | ICD-10-CM | POA: Diagnosis not present

## 2021-01-26 DIAGNOSIS — I482 Chronic atrial fibrillation, unspecified: Secondary | ICD-10-CM | POA: Diagnosis present

## 2021-01-26 DIAGNOSIS — D649 Anemia, unspecified: Secondary | ICD-10-CM

## 2021-01-26 DIAGNOSIS — Z90710 Acquired absence of both cervix and uterus: Secondary | ICD-10-CM

## 2021-01-26 DIAGNOSIS — J9621 Acute and chronic respiratory failure with hypoxia: Secondary | ICD-10-CM | POA: Diagnosis present

## 2021-01-26 DIAGNOSIS — G9341 Metabolic encephalopathy: Secondary | ICD-10-CM | POA: Diagnosis present

## 2021-01-26 DIAGNOSIS — E119 Type 2 diabetes mellitus without complications: Secondary | ICD-10-CM | POA: Diagnosis present

## 2021-01-26 DIAGNOSIS — Z8619 Personal history of other infectious and parasitic diseases: Secondary | ICD-10-CM

## 2021-01-26 DIAGNOSIS — Z8673 Personal history of transient ischemic attack (TIA), and cerebral infarction without residual deficits: Secondary | ICD-10-CM

## 2021-01-26 DIAGNOSIS — Z86718 Personal history of other venous thrombosis and embolism: Secondary | ICD-10-CM

## 2021-01-26 DIAGNOSIS — E43 Unspecified severe protein-calorie malnutrition: Secondary | ICD-10-CM | POA: Diagnosis present

## 2021-01-26 DIAGNOSIS — T83192S Other mechanical complication of urinary stent, sequela: Secondary | ICD-10-CM

## 2021-01-26 DIAGNOSIS — I82409 Acute embolism and thrombosis of unspecified deep veins of unspecified lower extremity: Secondary | ICD-10-CM | POA: Diagnosis not present

## 2021-01-26 LAB — CBC WITH DIFFERENTIAL/PLATELET
Abs Immature Granulocytes: 0.05 10*3/uL (ref 0.00–0.07)
Basophils Absolute: 0 10*3/uL (ref 0.0–0.1)
Basophils Relative: 0 %
Eosinophils Absolute: 0 10*3/uL (ref 0.0–0.5)
Eosinophils Relative: 0 %
HCT: 24.3 % — ABNORMAL LOW (ref 36.0–46.0)
Hemoglobin: 7.1 g/dL — ABNORMAL LOW (ref 12.0–15.0)
Immature Granulocytes: 0 %
Lymphocytes Relative: 10 %
Lymphs Abs: 1.2 10*3/uL (ref 0.7–4.0)
MCH: 25.5 pg — ABNORMAL LOW (ref 26.0–34.0)
MCHC: 29.2 g/dL — ABNORMAL LOW (ref 30.0–36.0)
MCV: 87.4 fL (ref 80.0–100.0)
Monocytes Absolute: 1.1 10*3/uL — ABNORMAL HIGH (ref 0.1–1.0)
Monocytes Relative: 9 %
Neutro Abs: 10.2 10*3/uL — ABNORMAL HIGH (ref 1.7–7.7)
Neutrophils Relative %: 81 %
Platelets: 398 10*3/uL (ref 150–400)
RBC: 2.78 MIL/uL — ABNORMAL LOW (ref 3.87–5.11)
RDW: 15.9 % — ABNORMAL HIGH (ref 11.5–15.5)
WBC: 12.6 10*3/uL — ABNORMAL HIGH (ref 4.0–10.5)
nRBC: 0 % (ref 0.0–0.2)

## 2021-01-26 LAB — URINALYSIS, ROUTINE W REFLEX MICROSCOPIC
Glucose, UA: 100 mg/dL — AB
Ketones, ur: NEGATIVE mg/dL
Nitrite: POSITIVE — AB
Protein, ur: >300 mg/dL — AB
Specific Gravity, Urine: 1.02 (ref 1.005–1.030)
pH: 5.5 (ref 5.0–8.0)

## 2021-01-26 LAB — URINALYSIS, MICROSCOPIC (REFLEX)
RBC / HPF: >50 RBC/hpf (ref 0–5)
WBC, UA: >50 WBC/hpf (ref 0–5)

## 2021-01-26 LAB — URINE CULTURE: Culture: <10000 — AB

## 2021-01-26 LAB — COMPREHENSIVE METABOLIC PANEL
ALT: 11 U/L (ref 0–44)
AST: 16 U/L (ref 15–41)
Albumin: 2.3 g/dL — ABNORMAL LOW (ref 3.5–5.0)
Alkaline Phosphatase: 43 U/L (ref 38–126)
Anion gap: 7 (ref 5–15)
BUN: 32 mg/dL — ABNORMAL HIGH (ref 8–23)
CO2: 26 mmol/L (ref 22–32)
Calcium: 9.5 mg/dL (ref 8.9–10.3)
Chloride: 106 mmol/L (ref 98–111)
Creatinine, Ser: 1.55 mg/dL — ABNORMAL HIGH (ref 0.44–1.00)
GFR, Estimated: 32 mL/min — ABNORMAL LOW (ref >60)
Glucose, Bld: 269 mg/dL — ABNORMAL HIGH (ref 70–99)
Potassium: 4.5 mmol/L (ref 3.5–5.1)
Sodium: 139 mmol/L (ref 135–145)
Total Bilirubin: 0.5 mg/dL (ref 0.3–1.2)
Total Protein: 6 g/dL — ABNORMAL LOW (ref 6.5–8.1)

## 2021-01-26 LAB — CBG MONITORING, ED: Glucose-Capillary: 206 mg/dL — ABNORMAL HIGH (ref 70–99)

## 2021-01-26 LAB — RESP PANEL BY RT-PCR (FLU A&B, COVID) ARPGX2
Influenza A by PCR: NEGATIVE
Influenza B by PCR: NEGATIVE
SARS Coronavirus 2 by RT PCR: NEGATIVE

## 2021-01-26 LAB — GLUCOSE, CAPILLARY: Glucose-Capillary: 221 mg/dL — ABNORMAL HIGH (ref 70–99)

## 2021-01-26 LAB — LACTIC ACID, PLASMA
Lactic Acid, Venous: 2.4 mmol/L (ref 0.5–1.9)
Lactic Acid, Venous: 2.7 mmol/L (ref 0.5–1.9)

## 2021-01-26 LAB — PREPARE RBC (CROSSMATCH)

## 2021-01-26 LAB — POC OCCULT BLOOD, ED: Fecal Occult Bld: NEGATIVE

## 2021-01-26 LAB — PROTIME-INR
INR: 2 — ABNORMAL HIGH (ref 0.8–1.2)
Prothrombin Time: 21.6 seconds — ABNORMAL HIGH (ref 11.4–15.2)

## 2021-01-26 MED ORDER — SODIUM CHLORIDE 0.45 % IV SOLN: INTRAVENOUS | Status: DC

## 2021-01-26 MED ORDER — ONDANSETRON HCL 4 MG PO TABS
4.0000 mg | ORAL_TABLET | Freq: Four times a day (QID) | ORAL | Status: DC | PRN
  Filled 2021-01-26: qty 1

## 2021-01-26 MED ORDER — INSULIN ASPART 100 UNIT/ML ~~LOC~~ SOLN
0.0000 [IU] | Freq: Three times a day (TID) | SUBCUTANEOUS | Status: DC
  Administered 2021-01-27: 2 [IU] via SUBCUTANEOUS
  Administered 2021-01-27: 1 [IU] via SUBCUTANEOUS
  Administered 2021-01-27 – 2021-01-28 (×3): 2 [IU] via SUBCUTANEOUS
  Administered 2021-01-28: 1 [IU] via SUBCUTANEOUS
  Administered 2021-01-29: 3 [IU] via SUBCUTANEOUS
  Administered 2021-01-29: 2 [IU] via SUBCUTANEOUS
  Administered 2021-01-31: 5 [IU] via SUBCUTANEOUS
  Administered 2021-01-31 (×2): 3 [IU] via SUBCUTANEOUS
  Administered 2021-02-01: 1 [IU] via SUBCUTANEOUS
  Administered 2021-02-01 – 2021-02-02 (×4): 2 [IU] via SUBCUTANEOUS

## 2021-01-26 MED ORDER — SODIUM CHLORIDE 0.9 % IV SOLN
500.0000 mg | Freq: Two times a day (BID) | INTRAVENOUS | Status: DC
  Administered 2021-01-26 – 2021-01-28 (×4): 500 mg via INTRAVENOUS
  Filled 2021-01-26 (×6): qty 0.5

## 2021-01-26 MED ORDER — SODIUM CHLORIDE 0.9% IV SOLUTION: Freq: Once | INTRAVENOUS | Status: AC

## 2021-01-26 MED ORDER — INSULIN ASPART 100 UNIT/ML ~~LOC~~ SOLN
0.0000 [IU] | Freq: Every day | SUBCUTANEOUS | Status: DC
  Administered 2021-01-26 – 2021-01-30 (×2): 2 [IU] via SUBCUTANEOUS
  Administered 2021-01-31 – 2021-02-01 (×2): 3 [IU] via SUBCUTANEOUS

## 2021-01-26 MED ORDER — ONDANSETRON HCL 4 MG/2ML IJ SOLN: 4.0000 mg | Freq: Four times a day (QID) | INTRAMUSCULAR | Status: DC | PRN

## 2021-01-26 NOTE — ED Triage Notes (Signed)
EMS called for pt being clammy pt d/c from [place] 2 days ago for UTI increasingly lethargic and a large lump to the right lateral breast near shoulder appearance of a deformity and moderate amount of bruising. Pt is responding to pain but not verbally responding O2 was 72%RA placed on NRB at 15 saturations

## 2021-01-26 NOTE — ED Notes (Signed)
I spoke with pharmacist, meropenem being prepared

## 2021-01-26 NOTE — ED Notes (Signed)
Patient transported to CT 

## 2021-01-26 NOTE — ED Provider Notes (Signed)
[facility] EMERGENCY DEPARTMENT Provider Note   CSN: [id number] Arrival date & time: 01/26/21  1205     History Chief Complaint  Patient presents with  . Altered Mental Status    Rebecca Pruitt is a 87 y.o. female.  HPI  Level 5 caveat secondary to altered mental status. 87 year old female who presents via EMS from home with reports that she has had multiple recent urinary tract infections and has had increasing lethargy and swelling to the right anterior chest area.  They reported that she had a decreased oxygen saturation of 72% and she had a nonrebreather placed and route. Daughter present after initial evaluation.  She states that her mother has had multiple urinary tract infections.  She reports that her mother has dementia but had been doing fairly well until 2 months ago when she had a urinary procedure.  She reports that since that time she has waxed and waned and that most of the time she is better when she is put on antibiotics.  She believes that this is secondary to urinary tract infections.  The patient has been seen by infectious disease.  She has also recently had a consult with palliative care.  Review of records from infectious disease revealed that she had a possible ESBL UTI.     Past Medical History:  Diagnosis Date  . Cancer ([place])    skin  . Coronary artery disease   . Diabetes mellitus   . Diabetic retinopathy   . Dyslipidemia   . Family history of adverse reaction to anesthesia    pts daughter vomits and has very difficult time to awaken  . H/O: CVA (cardiovascular accident)   . Hearing loss   . Heart murmur   . History of kidney stones   . Hypertension   . Stroke [facility]) 03/2020   x5    Patient Active Problem List   Diagnosis Date Noted  . Metabolic encephalopathy 49/70/2637  . DVT (deep venous thrombosis) ([place]) 01/05/2021  . Acute metabolic encephalopathy 85/88/5027  . Atrial fibrillation, chronic ([place]) 12/14/2020  . Anemia  12/14/2020  . Hypokalemia 12/14/2020  . History of CVA (cerebrovascular accident) 12/14/2020  . Pressure injury of skin 11/24/2020  . UTI (urinary tract infection) 11/22/2020  . Debility 11/22/2020  . Dementia ([place]) 11/22/2020  . Abnormal cardiac valve 11/22/2020  . DM2 (diabetes mellitus, type 2) ([place]) 11/22/2020    Past Surgical History:  Procedure Laterality Date  . CESAREAN SECTION    . CYSTOSCOPY WITH RETROGRADE PYELOGRAM, URETEROSCOPY AND STENT PLACEMENT Right 11/26/2020   Procedure: CYSTOSCOPY WITH RIGHT RETROGRADE PYELOGRAM, RIGHT URETEROSCOPY AND PYELOSCOPY, RIGHT STENT PLACEMENT, FULGERATION OF RIGHT URETER AND BLADDER;  Surgeon: Remi Haggard, MD;  Location: [facility] ORS;  Service: Urology;  Laterality: Right;  . DILATION AND CURETTAGE OF UTERUS    . EYE SURGERY Bilateral   . hysterectomy - unknown type    . MASTOIDECTOMY     as a child     OB History   No obstetric history on file.     Family History  Problem Relation Age of Onset  . Prostate cancer Other     Social History   Tobacco Use  . Smoking status: Never Smoker  . Smokeless tobacco: Never Used  Vaping Use  . Vaping Use: Never used  Substance Use Topics  . Alcohol use: No  . Drug use: Never    Home Medications Prior to Admission medications   Medication Sig Start Date End  Date Taking? Authorizing Provider  apixaban (ELIQUIS) 5 MG TABS tablet Take 1 tablet (5 mg total) by mouth 2 (two) times daily. Start after completing the starter pack Patient taking differently: Take 5 mg by mouth 2 (two) times daily. 02/06/21   Mercy Riding, MD  APIXABAN Arne Cleveland) VTE STARTER PACK (10MG  AND 5MG ) Take as directed on package: start with two-5mg  tablets twice daily for 7 days. On day 8, switch to one-5mg  tablet twice daily. Patient not taking: No sig reported 01/07/21   Mercy Riding, MD  atorvastatin (LIPITOR) 40 MG tablet Take 1 tablet (40 mg total) by mouth daily. 12/21/20 03/21/21  Kayleen Memos, DO  CRANBERRY PO  Take 1 tablet by mouth 2 (two) times daily.    [provider]  donepezil (ARICEPT) 10 MG tablet Take 10 mg by mouth at bedtime.    [provider]  furosemide (LASIX) 20 MG tablet Take 1 tablet (20 mg total) by mouth daily as needed for fluid or edema (swelling, weight gain >2 lbs). Patient taking differently: Take 10 mg by mouth daily as needed for fluid or edema (swelling, weight gain >2 lbs). 12/21/20   Kayleen Memos, DO  insulin NPH-insulin regular (NOVOLIN 70/30) (70-30) 100 UNIT/ML injection Inject 1-10 Units into the skin 3 (three) times daily as needed (high blood sugar). If blood sugar is over 150 takes 1 unit ; sliding scale    [provider]  Insulin Pen Needle (B-D ULTRAFINE III SHORT PEN) 31G X 8 MM MISC See admin instructions. 12/13/20   [provider]  melatonin 3 MG TABS tablet Take 3 mg by mouth at bedtime.    [provider]  mupirocin ointment (BACTROBAN) 2 % Apply 1 application topically daily as needed (cuts).    [provider]  OXYGEN Inhale 2 L into the lungs at bedtime.    [provider]    Allergies    Codeine  Review of Systems   Review of Systems  Physical Exam Updated Vital Signs BP (!) 132/55   Pulse 74   Temp 98.4 F (36.9 C)   Resp 13   Wt 50.3 kg   SpO2 100%   BMI 19.03 kg/m   Physical Exam Vitals and nursing note reviewed.  Constitutional:      General: She is not in acute distress.    Appearance: She is ill-appearing.  HENT:     Head: Atraumatic.     Right Ear: External ear normal.     Left Ear: External ear normal.     Nose: Nose normal.     Mouth/Throat:     Mouth: Mucous membranes are dry.  Eyes:     Pupils: Pupils are equal, round, and reactive to light.     Comments: Conjunctive a pale  Cardiovascular:     Rate and Rhythm: Normal rate. Rhythm irregular.     Pulses: Normal pulses.     Comments: Chest wall with large fluctuant mass right upper chest Pulmonary:      Effort: Pulmonary effort is normal.  Abdominal:     Palpations: Abdomen is soft.  Musculoskeletal:        General: No swelling or tenderness.     Cervical back: Normal range of motion.     Comments: Contusion noted of lower extremity  Skin:    Capillary Refill: Capillary refill takes less than 2 seconds.     Coloration: Skin is pale.  Neurological:     Mental  Status: She is alert.     Comments: Patient appears generally weak.  She does have some eye opening.  She does not appear to have any lateralized deficits     ED Results / Procedures / Treatments   Labs (all labs ordered are listed, but only abnormal results are displayed) Labs Reviewed  CBC WITH DIFFERENTIAL/PLATELET - Abnormal; Notable for the following components:      Result Value   WBC 12.6 (*)    RBC 2.78 (*)    Hemoglobin 7.1 (*)    HCT 24.3 (*)    MCH 25.5 (*)    MCHC 29.2 (*)    RDW 15.9 (*)    Neutro Abs 10.2 (*)    Monocytes Absolute 1.1 (*)    All other components within normal limits  COMPREHENSIVE METABOLIC PANEL - Abnormal; Notable for the following components:   Glucose, Bld 269 (*)    BUN 32 (*)    Creatinine, Ser 1.55 (*)    Total Protein 6.0 (*)    Albumin 2.3 (*)    GFR, Estimated 32 (*)    All other components within normal limits  PROTIME-INR - Abnormal; Notable for the following components:   Prothrombin Time 21.6 (*)    INR 2.0 (*)    All other components within normal limits  CBG MONITORING, ED - Abnormal; Notable for the following components:   Glucose-Capillary 206 (*)    All other components within normal limits  RESP PANEL BY RT-PCR (FLU A&B, COVID) ARPGX2  CULTURE, BLOOD (ROUTINE X 2)  CULTURE, BLOOD (ROUTINE X 2)  LACTIC ACID, PLASMA  LACTIC ACID, PLASMA  POC OCCULT BLOOD, ED  TYPE AND SCREEN  PREPARE RBC (CROSSMATCH)    EKG None  Radiology   Procedures Procedures   Medications Ordered in ED Medications  0.9 %  sodium chloride infusion (Manually program via  Guardrails IV Fluids) (has no administration in time range)    ED Course  I have reviewed the triage vital signs and the nursing notes.  Pertinent labs & imaging results that were available during my care of the patient were reviewed by me and considered in my medical decision making (see chart for details).    MDM Rules/Calculators/A&P                         1 altered mental status Patient on Eliquis CT head obtained without any evidence of intracranial hemorrhage Blood cultures obtained-no definite source of infection at this time, awaiting urinalysis No clear source of altered mental status at this time Patient appears around baseline now based on conversation with her daughter. Will need reevaluation after urinalysis to decide if patient needs to be treated for infection Leukocytosis noted 2 patient with elevated creatinine today is 1.55 with first prior 0.66 Patient's presentation consistent with acute kidney injury.  She will be given some IV fluids. 3 anemia patient's hemoglobin decreased to 7 today.  I suspect that there is a significant component of blood loss and to chest wall masses likely representing hematoma.  She has had 1 unit of blood ordered. Discussed with Dr. Darl Householder he has assumed care Final Clinical Impression(s) / ED Diagnoses Final diagnoses:  Altered mental status, unspecified altered mental status type  Hematoma of right chest wall, initial encounter  Anemia, unspecified type    Rx / DC Orders ED Discharge Orders    None       Pattricia Boss, MD 01/26/21 1557

## 2021-01-26 NOTE — ED Provider Notes (Signed)
  Physical Exam  BP (!) 86/66   Pulse 78   Temp 99.9 F (37.7 C)   Resp 12   Wt 50.3 kg   SpO2 94%   BMI 19.03 kg/m   Physical Exam  ED Course/Procedures     Procedures  MDM  Care assumed at 3 PM.  Patient is here with weakness and swelling on the right chest wall. Patient's hemoglobin dropped to 7 and 1 unit PRBC was ordered.  Patient also has recurrent ESBL UTIs.  Signout pending CT chest likely admission  7:16 PM Patient's urinalysis showed leuks and nitrates and many bacteria.  Ordered meropenem given history of ESBL.  Her lactate is 2.4.  CT showed large hematoma on the right pectoralis major muscle.  I called Dr. Bobbye Morton from trauma who will see patient.  She recommend medicine admission for UTI.  She will also see patient on consult.  CRITICAL CARE Performed by: Wandra Arthurs   Total critical care time: 30  minutes  Critical care time was exclusive of separately billable procedures and treating other patients.  Critical care was necessary to treat or prevent imminent or life-threatening deterioration.  Critical care was time spent personally by me on the following activities: development of treatment plan with patient and/or surrogate as well as nursing, discussions with consultants, evaluation of patient's response to treatment, examination of patient, obtaining history from patient or surrogate, ordering and performing treatments and interventions, ordering and review of laboratory studies, ordering and review of radiographic studies, pulse oximetry and re-evaluation of patient's condition.     Drenda Freeze, MD 01/26/21 [phone]

## 2021-01-26 NOTE — Progress Notes (Signed)
Pharmacy Antibiotic Note  Rebecca Pruitt is a 87 y.o. female admitted on 01/26/2021 with UTI -h/o ESBL. Pharmacy has been consulted for Meropenem dosing.  Scr up to 1.55 (baseline <1)  Plan: Meropenem 500mg  IV q12h Will f/u renal function, micro data, and pt's clinical condition  Weight: 50.3 kg (110 lb 14.3 oz)  Temp (24hrs), Avg:98.5 F (36.9 C), Min:97.2 F (36.2 C), Max:99.6 F (37.6 C)  Recent Labs  Lab 01/24/21 1520 01/26/21 1228 01/26/21 1610  WBC 7.6 12.6*  --   CREATININE 0.66 1.55*  --   LATICACIDVEN  --   --  2.4*    Estimated Creatinine Clearance: 19.5 mL/min (A) (by C-G formula based on SCr of 1.55 mg/dL (H)).    Allergies  Allergen Reactions  . Codeine Other (See Comments)    Syncope     Antimicrobials this admission: 4/9 Meropenem >>   Microbiology results: 4/9 BCx:  4/9 UCx:    Thank you for allowing pharmacy to be a part of this patient's care.  Sherlon Handing, PharmD, BCPS Please see amion for complete clinical pharmacist phone list 01/26/2021 5:58 PM

## 2021-01-26 NOTE — Consult Note (Signed)
Rebecca Pruitt [birth year]  [id number].    Requesting MD: Dr. Darl Householder Chief Complaint/Reason for Consult: chest wall hematoma  HPI:  Rebecca Pruitt is an 87 yo female with an extensive medical history who presented to the ED with weakness and altered mental status. She has a recent history of recurrent UTI. UA in the ED is consistent with a UTI, and the patient has been admitted and started on antibiotics. The patient's daughter also reports that in the last 24 hours she noticed a lump on the patient's right upper chest. The patient is on Eliquis for a history of DVT. Her daughter reports that when they move the patient at home, they often have to hold her underneath her arms, very close to wear the chest wall mass is. She is otherwise not aware of the patient having any recent trauma to that area. Since the mass was first noticed, it has not changed significantly. Patient's hgb in the ED was 7.1 and (from 9.3 two days ago) and she was transfused 1u PRBCs. She has remained hemodynamically stable. Daughter reports that patient also recently had a hematoma on her right lower leg.  ROS: Review of Systems  Constitutional: Negative for chills and fever.  Respiratory: Negative for shortness of breath.   Neurological:       Confusion    Family History  Problem Relation Age of Onset  . Prostate cancer Other     Past Medical History:  Diagnosis Date  . Cancer ([place])    skin  . Coronary artery disease   . Diabetes mellitus   . Diabetic retinopathy   . Dyslipidemia   . Family history of adverse reaction to anesthesia    pts daughter vomits and has very difficult time to awaken  . H/O: CVA (cardiovascular accident)   . Hearing loss   . Heart murmur   . History of kidney stones   . Hypertension   . Stroke [facility]) 03/2020   x5    Past Surgical History:  Procedure Laterality Date  . CESAREAN SECTION    . CYSTOSCOPY WITH RETROGRADE PYELOGRAM, URETEROSCOPY AND STENT PLACEMENT Right  11/26/2020   Procedure: CYSTOSCOPY WITH RIGHT RETROGRADE PYELOGRAM, RIGHT URETEROSCOPY AND PYELOSCOPY, RIGHT STENT PLACEMENT, FULGERATION OF RIGHT URETER AND BLADDER;  Surgeon: Remi Haggard, MD;  Location: [facility]S;  Service: Urology;  Laterality: Right;  . DILATION AND CURETTAGE OF UTERUS    . EYE SURGERY Bilateral   . hysterectomy - unknown type    . MASTOIDECTOMY     as a child    Social History:  reports that she has never smoked. She has never used smokeless tobacco. She reports that she does not drink alcohol and does not use drugs.  Allergies:  Allergies  Allergen Reactions  . Codeine Other (See Comments)    Syncope     Medications Prior to Admission  Medication Sig Dispense Refill  . [START ON 02/06/2021] apixaban (ELIQUIS) 5 MG TABS tablet Take 1 tablet (5 mg total) by mouth 2 (two) times daily. Start after completing the starter pack (Patient taking differently: Take 5 mg by mouth 2 (two) times daily.) 60 tablet 2  . atorvastatin (LIPITOR) 40 MG tablet Take 1 tablet (40 mg total) by mouth daily. 90 tablet 0  . CRANBERRY PO Take 1 tablet by mouth 2 (two) times daily.    Marland Kitchen donepezil (ARICEPT) 10 MG tablet Take 10 mg by mouth at bedtime.    . furosemide (LASIX) 20 MG  tablet Take 1 tablet (20 mg total) by mouth daily as needed for fluid or edema (swelling, weight gain >2 lbs). (Patient taking differently: Take 10 mg by mouth daily as needed for fluid or edema (swelling, weight gain >2 lbs).) 30 tablet 0  . insulin NPH-insulin regular (NOVOLIN 70/30) (70-30) 100 UNIT/ML injection Inject 1-10 Units into the skin 3 (three) times daily as needed (high blood sugar). If blood sugar is over 150 takes 1 unit ; sliding scale    . melatonin 3 MG TABS tablet Take 3 mg by mouth at bedtime.    . mupirocin ointment (BACTROBAN) 2 % Apply 1 application topically daily as needed (cuts).    . OXYGEN Inhale 2 L into the lungs at bedtime.    . APIXABAN (ELIQUIS) VTE STARTER PACK (10MG  AND 5MG ) Take as  directed on package: start with two-5mg  tablets twice daily for 7 days. On day 8, switch to one-5mg  tablet twice daily. (Patient not taking: No sig reported) 1 tablet 0  . Insulin Pen Needle (B-D ULTRAFINE III SHORT PEN) 31G X 8 MM MISC See admin instructions.       Physical Exam: Blood pressure (!) 129/56, pulse 91, temperature 98.3 F (36.8 C), temperature source Oral, resp. rate 16, weight 50.3 kg, SpO2 100 %. General: resting comfortably, appears stated age, no apparent distress Neurological: alert, no focal deficits HEENT: normocephalic, atraumatic, no scleral icterus CV: regular rate and rhythm, extremities warm and well-perfused Respiratory: normal work of breathing, symmetric chest wall expansion. Large subcutaneous mass on right upper lateral chest wall, nontender to palpation. Overlying skin is normal without erythema, induration or ecchymosis. Extremities: warm and well-perfused. Area of ecchymosis on right lower leg. Skin: warm and dry, no jaundice, no rashes or lesions   Results for orders placed or performed during the hospital encounter of 01/26/21 (from the past 48 hour(s))  CBG monitoring, ED     Status: Abnormal   Collection Time: 01/26/21 12:14 PM  Result Value Ref Range   Glucose-Capillary 206 (H) 70 - 99 mg/dL    Comment: Glucose reference range applies only to samples taken after fasting for at least 8 hours.  Type and screen [place]     Status: None (Preliminary result)   Collection Time: 01/26/21 12:20 PM  Result Value Ref Range   ABO/RH(D) O NEG    Antibody Screen NEG    Sample Expiration 01/29/2021,2359    Unit Number [id number]    Blood Component Type RED CELLS,LR    Unit division 00    Status of Unit ISSUED    Transfusion Status OK TO TRANSFUSE    Crossmatch Result      Compatible Performed at [facility], [place] [address] [place],  [address]   CBC with Differential/Platelet     Status: Abnormal   Collection Time:  01/26/21 12:28 PM  Result Value Ref Range   WBC 12.6 (H) 4.0 - 10.5 K/uL   RBC 2.78 (L) 3.87 - 5.11 MIL/uL   Hemoglobin 7.1 (L) 12.0 - 15.0 g/dL   HCT 24.3 (L) 36.0 - 46.0 %   MCV 87.4 80.0 - 100.0 fL   MCH 25.5 (L) 26.0 - 34.0 pg   MCHC 29.2 (L) 30.0 - 36.0 g/dL   RDW 15.9 (H) 11.5 - 15.5 %   Platelets 398 150 - 400 K/uL   nRBC 0.0 0.0 - 0.2 %   Neutrophils Relative % 81 %   Neutro Abs 10.2 (H) 1.7 -  7.7 K/uL   Lymphocytes Relative 10 %   Lymphs Abs 1.2 0.7 - 4.0 K/uL   Monocytes Relative 9 %   Monocytes Absolute 1.1 (H) 0.1 - 1.0 K/uL   Eosinophils Relative 0 %   Eosinophils Absolute 0.0 0.0 - 0.5 K/uL   Basophils Relative 0 %   Basophils Absolute 0.0 0.0 - 0.1 K/uL   Immature Granulocytes 0 %   Abs Immature Granulocytes 0.05 0.00 - 0.07 K/uL    Comment: Performed at [facility] [address] [place], [place] [address]  Comprehensive metabolic panel     Status: Abnormal   Collection Time: 01/26/21 12:28 PM  Result Value Ref Range   Sodium 139 135 - 145 mmol/L   Potassium 4.5 3.5 - 5.1 mmol/L   Chloride 106 98 - 111 mmol/L   CO2 26 22 - 32 mmol/L   Glucose, Bld 269 (H) 70 - 99 mg/dL    Comment: Glucose reference range applies only to samples taken after fasting for at least 8 hours.   BUN 32 (H) 8 - 23 mg/dL   Creatinine, Ser 1.55 (H) 0.44 - 1.00 mg/dL   Calcium 9.5 8.9 - 10.3 mg/dL   Total Protein 6.0 (L) 6.5 - 8.1 g/dL   Albumin 2.3 (L) 3.5 - 5.0 g/dL   AST 16 15 - 41 U/L   ALT 11 0 - 44 U/L   Alkaline Phosphatase 43 38 - 126 U/L   Total Bilirubin 0.5 0.3 - 1.2 mg/dL   GFR, Estimated 32 (L) >60 mL/min    Comment: (NOTE) Calculated using the CKD-EPI Creatinine Equation (2021)    Anion gap 7 5 - 15    Comment: Performed at [facility], [place] [address] [place], [place] [address]  Protime-INR     Status: Abnormal   Collection Time: 01/26/21 12:28 PM  Result Value Ref Range   Prothrombin Time 21.6 (H) 11.4 - 15.2 seconds   INR 2.0 (H) 0.8 - 1.2     Comment: (NOTE) INR goal varies based on device and disease states. Performed at [facility], [place] [address] [place], [place] [address]   POC occult blood, ED RN will collect     Status: None   Collection Time: 01/26/21 12:35 PM  Result Value Ref Range   Fecal Occult Bld NEGATIVE NEGATIVE  Resp Panel by RT-PCR (Flu A&B, Covid) Nasopharyngeal Swab     Status: None   Collection Time: 01/26/21  1:00 PM   Specimen: Nasopharyngeal Swab; Nasopharyngeal(NP) swabs in vial transport medium  Result Value Ref Range   SARS Coronavirus 2 by RT PCR NEGATIVE NEGATIVE    Comment: (NOTE) SARS-CoV-2 target nucleic acids are NOT DETECTED.  The SARS-CoV-2 RNA is generally detectable in upper respiratory specimens during the acute phase of infection. The lowest concentration of SARS-CoV-2 viral copies this assay can detect is 138 copies/mL. A negative result does not preclude SARS-Cov-2 infection and should not be used as the sole basis for treatment or other patient management decisions. A negative result may occur with  improper specimen collection/handling, submission of specimen other than nasopharyngeal swab, presence of viral mutation(s) within the areas targeted by this assay, and inadequate number of viral copies(<138 copies/mL). A negative result must be combined with clinical observations, patient history, and epidemiological information. The expected result is Negative.  Fact Sheet for Patients:  EntrepreneurPulse.com.au  Fact Sheet for Healthcare Providers:  IncredibleEmployment.be  This test is no t yet approved or  cleared by the [name] and  has been authorized for detection and/or diagnosis of SARS-CoV-2 by FDA under an Emergency Use Authorization (EUA). This EUA will remain  in effect (meaning this test can be used) for the duration of the COVID-19 declaration under Section 564(b)(1) of the Act, 21 U.S.C.section 360bbb-3(b)(1),  unless the authorization is terminated  or revoked sooner.       Influenza A by PCR NEGATIVE NEGATIVE   Influenza B by PCR NEGATIVE NEGATIVE    Comment: (NOTE) The Xpert Xpress SARS-CoV-2/FLU/RSV plus assay is intended as an aid in the diagnosis of influenza from Nasopharyngeal swab specimens and should not be used as a sole basis for treatment. Nasal washings and aspirates are unacceptable for Xpert Xpress SARS-CoV-2/FLU/RSV testing.  Fact Sheet for Patients: EntrepreneurPulse.com.au  Fact Sheet for Healthcare Providers: IncredibleEmployment.be  This test is not yet approved or cleared by the [place] FDA and has been authorized for detection and/or diagnosis of SARS-CoV-2 by FDA under an Emergency Use Authorization (EUA). This EUA will remain in effect (meaning this test can be used) for the duration of the COVID-19 declaration under Section 564(b)(1) of the Act, 21 U.S.C. section 360bbb-3(b)(1), unless the authorization is terminated or revoked.  Performed at [facility], [place] [address] [place], [place] [address]   Prepare RBC (crossmatch)     Status: None   Collection Time: 01/26/21  2:34 PM  Result Value Ref Range   Order Confirmation      ORDER PROCESSED BY BLOOD BANK Performed at [facility], [place] [address] [place], [place] [address]   Lactic acid, plasma     Status: Abnormal   Collection Time: 01/26/21  4:10 PM  Result Value Ref Range   Lactic Acid, Venous 2.4 (HH) 0.5 - 1.9 mmol/L    Comment: CRITICAL RESULT CALLED TO, READ BACK BY AND VERIFIED WITH: GINNY MULLIS RN.@[id number]  ON 4.9.22 BY TCALDWELL MT. Performed at [facility], [place] [address], [place], [place] [address]   Urinalysis, Routine w reflex microscopic Urine, Catheterized     Status: Abnormal   Collection Time: 01/26/21  5:52 PM  Result Value Ref Range   Color, Urine BROWN (A) YELLOW    Comment: BIOCHEMICALS MAY BE AFFECTED BY COLOR    APPearance TURBID (A) CLEAR   Specific Gravity, Urine 1.020 1.005 - 1.030   pH 5.5 5.0 - 8.0   Glucose, UA 100 (A) NEGATIVE mg/dL   Hgb urine dipstick LARGE (A) NEGATIVE   Bilirubin Urine MODERATE (A) NEGATIVE   Ketones, ur NEGATIVE NEGATIVE mg/dL   Protein, ur >300 (A) NEGATIVE mg/dL   Nitrite POSITIVE (A) NEGATIVE   Leukocytes,Ua LARGE (A) NEGATIVE    Comment: Performed at [facility], [place] [address], [place], [place] [address]  Urinalysis, Microscopic (reflex)     Status: Abnormal   Collection Time: 01/26/21  5:52 PM  Result Value Ref Range   RBC / HPF >50 0 - 5 RBC/hpf   WBC, UA >50 0 - 5 WBC/hpf   Bacteria, UA MANY (A) NONE SEEN   Squamous Epithelial / LPF 0-5 0 - 5   Non Squamous Epithelial PRESENT (A) NONE SEEN   Budding Yeast PRESENT     Comment: Performed at [facility], [place] [address] [place], [place] [address]  Glucose, capillary     Status: Abnormal   Collection Time: 01/26/21  9:32 PM  Result Value Ref Range   Glucose-Capillary 221 (  H) 70 - 99 mg/dL    Comment: Glucose reference range applies only to samples taken after fasting for at least 8 hours.   CT Head Wo Contrast  Result Date: 01/26/2021 CLINICAL DATA:  Altered mental status EXAM: CT HEAD WITHOUT CONTRAST TECHNIQUE: Contiguous axial images were obtained from the base of the skull through the vertex without intravenous contrast. COMPARISON:  January 04, 2021 head CT; December 16, 2020 brain MRI FINDINGS: Brain: Diffuse atrophy is stable. There is no intracranial mass hemorrhage, extra-axial fluid collection, or midline shift. There is decreased attenuation throughout the centra semiovale bilaterally. Decreased attenuation is also noted in portions of the internal and external capsules, stable. These changes are consistent with supratentorial small vessel disease. Small vessel disease also noted in the thalami. No acute infarct is evident on this study. Vascular: There is no appreciable hyperdense vessel.  There is calcification in each carotid siphon and distal left vertebral artery region. Skull: The bony calvarium appears intact. Sinuses/Orbits: Patient has undergone previous antrostomy on the left. There is mucosal thickening in several ethmoid air cells. Orbits appear symmetric bilaterally. Other: Mastoid air cells on the left are clear. Postoperative changes involving the mastoids on the right noted with remaining mastoids on the right clear. 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 No acute infarct evident. No mass or hemorrhage. Foci of arterial vascular calcification noted. Previous mastoidectomy on the right. Remaining mastoids are clear. Mucosal thickening noted in several ethmoid air cells. Electronically Signed   By: Lowella Grip III M.D.   On: 01/26/2021 14:18   CT CHEST WO CONTRAST  Result Date: 01/26/2021 CLINICAL DATA:  Chest trauma, large swelling mass of the anterior right chest EXAM: CT CHEST WITHOUT CONTRAST TECHNIQUE: Multidetector CT imaging of the chest was performed following the standard protocol without IV contrast. COMPARISON:  01/24/2021 FINDINGS: Cardiovascular: Aortic atherosclerosis. Dense aortic valve calcifications. Cardiomegaly. Dense mitral annulus calcifications. Three-vessel coronary artery calcifications and/or stents. Small pericardial effusion. Mediastinum/Nodes: No enlarged mediastinal, hilar, or axillary lymph nodes. Thyroid gland, trachea, and esophagus demonstrate no significant findings. Lungs/Pleura: Lungs are clear. Small right, trace left pleural effusions, similar to prior examination. Upper Abdomen: No acute abnormality. Musculoskeletal: Large hematoma within the right pectoralis major muscle body measuring 8.3 x 5.4 cm (series 3, image 23). Soft tissue contusion of the right chest wall. No chest wall mass or suspicious bone lesions identified. IMPRESSION: 1. Large hematoma within the right pectoralis major muscle body  measuring 8.3 x 5.4 cm. Soft tissue contusion of the right chest wall. 2. Small right, trace left pleural effusions, similar to prior examination. 3. Cardiomegaly and small pericardial effusion, unchanged compared to prior examination. 4. Coronary artery disease. 5. Dense aortic valve calcifications. Dense mitral annulus calcifications. For correlate for echocardiographic evidence of valve dysfunction. Aortic Atherosclerosis (ICD10-I70.0). Electronically Signed   By: Eddie Candle M.D.   On: 01/26/2021 16:07   DG Chest Port 1 View  Result Date: 01/26/2021 CLINICAL DATA:  Decreased oxygen saturation.  Dyspnea. EXAM: PORTABLE CHEST 1 VIEW COMPARISON:  January 24, 2021 chest radiograph and chest CT FINDINGS: There is no edema or airspace opacity. There is cardiomegaly with pulmonary vascularity normal. No adenopathy. There is mitral annulus calcification as well as calcification in the aorta and in each carotid artery. Degenerative change noted in each shoulder. IMPRESSION: No edema or airspace opacity. Cardiomegaly. There is mitral annulus calcification. There also foci of carotid artery calcification and aortic atherosclerosis. Aortic Atherosclerosis (ICD10-I70.0). Electronically Signed  By: Lowella Grip III M.D.   On: 01/26/2021 14:20      Assessment/Plan 87 yo female with multiple medical comorbidities, presenting with likely recurrent UTI as well as a chest wall hematoma. I reviewed the patient's chest CT, which shows a large mass on the right pectoralis muscle, well-circumscribed and consistent with a hematoma. This is most likely secondary to local trauma to the area in the setting of anticoagulation. Per daughter, the hematoma has not changed in size since it was first noticed. It does not seem to be causing the patient discomfort. INR is 2.0. No indication for drainage of the hematoma in the absence of infection, overlying skin breakdown, or significant pain. Please hold anticoagulation for now to  ensure the hematoma is not expanding. Surgery will continue to follow.  Michaelle Birks, [facility], Hepatobiliary and Pancreatic Surgery 01/26/21 9:45 PM

## 2021-01-26 NOTE — H&P (Signed)
History and Physical   Rebecca Pruitt:[id number] DOB: 1929/07/04 DOA: 01/26/2021  Referring MD/NP/PA: Dr. Darl Householder  PCP: Vicenta Aly, [place]   Outpatient Specialists: Dr. Harold Barban, urology  Patient coming from: Home  Chief Complaint: Weakness and altered mental status  HPI: Rebecca Pruitt is a 87 y.o. female with medical history significant of dementia, diabetes, coronary artery disease, hypertension and CVA who was brought in by her daughter secondary to altered mental status.  Patient's daughter reported multiple urinary tract infections lately.  She also had history of DVT and has been on anticoagulation.  She apparently has had another swelling of the right anterior chest area.  Oxygen saturation dropped to 72% when EMS arrived.  No reported fall.  Patient is here confused unable to give any history so all history is from the daughter.  She is lethargic.  She was found to have another episode of UTI as well as anterior chest wall hematoma.  Surgery consulted from we have admitting patient for treatment.  She had recent cystoscopy by Dr. Milford Cage back in February.  Patient is weak and has been rapidly declining since the procedure.  Per daughter she was not like there prior to the procedure.  She was very active despite her dementia..  ED Course: Temperature 99.9 blood pressure 86/66 pulse 107 respiratory oxygen sat 94% on room air.  White count is 12.6 hemoglobin 7.1 and platelets 398.  Lactic acid 2.4 glucose 269 creatinine 1.57 BUN 32.  COVID-19 screen is negative.  Urinalysis showed positive nitrite positive leukocyte WBC more than 50 and many bacteria.  Patient being admitted with metabolic encephalopathy most likely due to UTI.  Review of Systems: As per HPI otherwise 10 point review of systems negative.    Past Medical History:  Diagnosis Date  . Cancer ([place])    skin  . Coronary artery disease   . Diabetes mellitus   . Diabetic retinopathy   . Dyslipidemia   . Family  history of adverse reaction to anesthesia    pts daughter vomits and has very difficult time to awaken  . H/O: CVA (cardiovascular accident)   . Hearing loss   . Heart murmur   . History of kidney stones   . Hypertension   . Stroke [facility]) 03/2020   x5    Past Surgical History:  Procedure Laterality Date  . CESAREAN SECTION    . CYSTOSCOPY WITH RETROGRADE PYELOGRAM, URETEROSCOPY AND STENT PLACEMENT Right 11/26/2020   Procedure: CYSTOSCOPY WITH RIGHT RETROGRADE PYELOGRAM, RIGHT URETEROSCOPY AND PYELOSCOPY, RIGHT STENT PLACEMENT, FULGERATION OF RIGHT URETER AND BLADDER;  Surgeon: Remi Haggard, MD;  Location: [facility];  Service: Urology;  Laterality: Right;  . DILATION AND CURETTAGE OF UTERUS    . EYE SURGERY Bilateral   . hysterectomy - unknown type    . MASTOIDECTOMY     as a child     reports that she has never smoked. She has never used smokeless tobacco. She reports that she does not drink alcohol and does not use drugs.  Allergies  Allergen Reactions  . Codeine Other (See Comments)    Syncope     Family History  Problem Relation Age of Onset  . Prostate cancer Other      Prior to Admission medications   Medication Sig Start Date End Date Taking? Authorizing Provider  apixaban (ELIQUIS) 5 MG TABS tablet Take 1 tablet (5 mg total) by mouth 2 (two) times daily. Start after completing the starter pack Patient taking differently:  Take 5 mg by mouth 2 (two) times daily. 02/06/21  Yes Mercy Riding, MD  atorvastatin (LIPITOR) 40 MG tablet Take 1 tablet (40 mg total) by mouth daily. 12/21/20 03/21/21 Yes Hall, Carole N, DO  CRANBERRY PO Take 1 tablet by mouth 2 (two) times daily.   Yes [provider]  donepezil (ARICEPT) 10 MG tablet Take 10 mg by mouth at bedtime.   Yes [provider]  furosemide (LASIX) 20 MG tablet Take 1 tablet (20 mg total) by mouth daily as needed for fluid or edema (swelling, weight gain >2 lbs). Patient taking differently: Take 10 mg by  mouth daily as needed for fluid or edema (swelling, weight gain >2 lbs). 12/21/20  Yes Hall, Carole N, DO  insulin NPH-insulin regular (NOVOLIN 70/30) (70-30) 100 UNIT/ML injection Inject 1-10 Units into the skin 3 (three) times daily as needed (high blood sugar). If blood sugar is over 150 takes 1 unit ; sliding scale   Yes [provider]  melatonin 3 MG TABS tablet Take 3 mg by mouth at bedtime.   Yes [provider]  mupirocin ointment (BACTROBAN) 2 % Apply 1 application topically daily as needed (cuts).   Yes [provider]  OXYGEN Inhale 2 L into the lungs at bedtime.   Yes [provider]  APIXABAN Arne [place]) VTE STARTER PACK (10MG  AND 5MG ) Take as directed on package: start with two-5mg  tablets twice daily for 7 days. On day 8, switch to one-5mg  tablet twice daily. Patient not taking: No sig reported 01/07/21   Mercy Riding, MD  Insulin Pen Needle (B-D ULTRAFINE III SHORT PEN) 31G X 8 MM MISC See admin instructions. 12/13/20   [provider]    Physical Exam: Vitals:   01/26/21 1730 01/26/21 1800 01/26/21 1830 01/26/21 1900  BP: (!) 144/48 (!) 124/56 (!) 86/66   Pulse: 75 96 94 78  Resp:  12    Temp: 99.6 F (37.6 C) 99.8 F (37.7 C) 99.9 F (37.7 C) 99.9 F (37.7 C)  TempSrc:      SpO2: 100% 100% 98% 94%  Weight:          Constitutional: Chronically ill looking, lethargic Vitals:   01/26/21 1730 01/26/21 1800 01/26/21 1830 01/26/21 1900  BP: (!) 144/48 (!) 124/56 (!) 86/66   Pulse: 75 96 94 78  Resp:  12    Temp: 99.6 F (37.6 C) 99.8 F (37.7 C) 99.9 F (37.7 C) 99.9 F (37.7 C)  TempSrc:      SpO2: 100% 100% 98% 94%  Weight:       Eyes: PERRL, lids and conjunctivae normal ENMT: Mucous membranes are dry.  Posterior pharynx clear of any exudate or lesions.Normal dentition.  Neck: normal, supple, no masses, no thyromegaly Respiratory: clear to auscultation bilaterally, no wheezing, no crackles. Normal respiratory effort.  No accessory muscle use.  Cardiovascular: Irregularly irregular rate and rhythm, no murmurs / rubs / gallops. No extremity edema. 2+ pedal pulses. No carotid bruits.  Abdomen: no tenderness, no masses palpated. No hepatosplenomegaly. Bowel sounds positive.  Musculoskeletal: no clubbing / cyanosis. No joint deformity upper and lower extremities. Good ROM, no contractures. Normal muscle tone.  Skin: no rashes, lesions, ulcers. No induration Neurologic: CN 2-12 grossly intact. Sensation intact, DTR normal. Strength 5/5 in all 4.  Psychiatric: Lethargic, obtunded, no distress but not communicating well    Labs on Admission: I have personally reviewed following labs and imaging studies  CBC: Recent Labs  Lab 01/24/21 1520 01/26/21 1228  WBC 7.6 12.6*  NEUTROABS 5.6 10.2*  HGB 9.3* 7.1*  HCT 31.1* 24.3*  MCV 85.7 87.4  PLT 302 845   Basic Metabolic Panel: Recent Labs  Lab 01/24/21 1520 01/26/21 1228  NA 138 139  K 3.4* 4.5  CL 107 106  CO2 23 26  GLUCOSE 147* 269*  BUN 15 32*  CREATININE 0.66 1.55*  CALCIUM 9.6 9.5   GFR: Estimated Creatinine Clearance: 19.5 mL/min (A) (by C-G formula based on SCr of 1.55 mg/dL (H)). Liver Function Tests: Recent Labs  Lab 01/24/21 1520 01/26/21 1228  AST 21 16  ALT 15 11  ALKPHOS 47 43  BILITOT 0.9 0.5  PROT 7.0 6.0*  ALBUMIN 2.8* 2.3*   No results for input(s): LIPASE, AMYLASE in the last 168 hours. No results for input(s): AMMONIA in the last 168 hours. Coagulation Profile: Recent Labs  Lab 01/26/21 1228  INR 2.0*   Cardiac Enzymes: No results for input(s): CKTOTAL, CKMB, CKMBINDEX, TROPONINI in the last 168 hours. BNP (last 3 results) No results for input(s): PROBNP in the last 8760 hours. HbA1C: No results for input(s): HGBA1C in the last 72 hours. CBG: Recent Labs  Lab 01/26/21 1214  GLUCAP 206*   Lipid Profile: No results for input(s): CHOL, HDL, LDLCALC, TRIG, CHOLHDL, LDLDIRECT in the last 72 hours. Thyroid  Function Tests: Recent Labs    01/24/21 1521  TSH 0.981   Anemia Panel: No results for input(s): VITAMINB12, FOLATE, FERRITIN, TIBC, IRON, RETICCTPCT in the last 72 hours. Urine analysis:    Component Value Date/Time   COLORURINE BROWN (A) 01/26/2021 1752   APPEARANCEUR TURBID (A) 01/26/2021 1752   LABSPEC 1.020 01/26/2021 1752   PHURINE 5.5 01/26/2021 1752   GLUCOSEU 100 (A) 01/26/2021 1752   HGBUR LARGE (A) 01/26/2021 1752   BILIRUBINUR MODERATE (A) 01/26/2021 1752   KETONESUR NEGATIVE 01/26/2021 1752   PROTEINUR >300 (A) 01/26/2021 1752   UROBILINOGEN 0.2 05/26/2008 0817   NITRITE POSITIVE (A) 01/26/2021 1752   LEUKOCYTESUR LARGE (A) 01/26/2021 1752   Sepsis Labs: @LABRCNTIP (procalcitonin:4,lacticidven:4) ) Recent Results (from the past 240 hour(s))  Culture, blood (routine x 2)     Status: None   Collection Time: 01/17/21  2:25 PM   Specimen: BLOOD  Result Value Ref Range Status   Specimen Description   Final    BLOOD LEFT ANTECUBITAL Performed at [facility], [place] [address], [place], [place] [address]    Special Requests   Final    BOTTLES DRAWN AEROBIC AND ANAEROBIC Blood Culture results may not be optimal due to an inadequate volume of blood received in culture bottles Performed at [place] [address], [place], [place] [address]    Culture   Final    NO GROWTH 5 DAYS Performed at [facility], [place] [address], [place], [place] [address]    Report Status 01/22/2021 FINAL  Final  Urine culture     Status: Abnormal   Collection Time: 01/17/21  5:42 PM   Specimen: Urine, Random  Result Value Ref Range Status   Specimen Description   Final    URINE, RANDOM Performed at [place] [address] [place], [place] [address]    Special Requests   Final    NONE Performed at [facility], [place] [address], [place], [place] [address]    Culture (A)  Final    <10,000  COLONIES/mL INSIGNIFICANT GROWTH Performed  at [facility], [place] [address] [place], [place] [address]    Report Status 01/19/2021 FINAL  Final  Urine culture     Status: Abnormal   Collection Time: 01/24/21  4:18 PM   Specimen: Urine, Random  Result Value Ref Range Status   Specimen Description   Final    URINE, RANDOM Performed at [place] [address] [place], [place] [address]    Special Requests   Final    NONE Performed at [facility], [place] [address], [place], [place] [address]    Culture (A)  Final    <10,000 COLONIES/mL INSIGNIFICANT GROWTH Performed at [place] [facility] [address] [place], [place] [address]    Report Status 01/26/2021 FINAL  Final  Resp Panel by RT-PCR (Flu A&B, Covid) Nasopharyngeal Swab     Status: None   Collection Time: 01/24/21  4:33 PM   Specimen: Nasopharyngeal Swab; Nasopharyngeal(NP) swabs in vial transport medium  Result Value Ref Range Status   SARS Coronavirus 2 by RT PCR NEGATIVE NEGATIVE Final    Comment: (NOTE) SARS-CoV-2 target nucleic acids are NOT DETECTED.  The SARS-CoV-2 RNA is generally detectable in upper respiratory specimens during the acute phase of infection. The lowest concentration of SARS-CoV-2 viral copies this assay can detect is 138 copies/mL. A negative result does not preclude SARS-Cov-2 infection and should not be used as the sole basis for treatment or other patient management decisions. A negative result may occur with  improper specimen collection/handling, submission of specimen other than nasopharyngeal swab, presence of viral mutation(s) within the areas targeted by this assay, and inadequate number of viral copies(<138 copies/mL). A negative result must be combined with clinical observations, patient history, and epidemiological information. The expected result is Negative.  Fact Sheet for Patients:   EntrepreneurPulse.com.au  Fact Sheet for Healthcare Providers:  IncredibleEmployment.be  This test is no t yet approved or cleared by the [place] FDA and  has been authorized for detection and/or diagnosis of SARS-CoV-2 by FDA under an Emergency Use Authorization (EUA). This EUA will remain  in effect (meaning this test can be used) for the duration of the COVID-19 declaration under Section 564(b)(1) of the Act, 21 U.S.C.section 360bbb-3(b)(1), unless the authorization is terminated  or revoked sooner.       Influenza A by PCR NEGATIVE NEGATIVE Final   Influenza B by PCR NEGATIVE NEGATIVE Final    Comment: (NOTE) The Xpert Xpress SARS-CoV-2/FLU/RSV plus assay is intended as an aid in the diagnosis of influenza from Nasopharyngeal swab specimens and should not be used as a sole basis for treatment. Nasal washings and aspirates are unacceptable for Xpert Xpress SARS-CoV-2/FLU/RSV testing.  Fact Sheet for Patients: EntrepreneurPulse.com.au  Fact Sheet for Healthcare Providers: IncredibleEmployment.be  This test is not yet approved or cleared by the [place] FDA and has been authorized for detection and/or diagnosis of SARS-CoV-2 by FDA under an Emergency Use Authorization (EUA). This EUA will remain in effect (meaning this test can be used) for the duration of the COVID-19 declaration under Section 564(b)(1) of the Act, 21 U.S.C. section 360bbb-3(b)(1), unless the authorization is terminated or revoked.  Performed at [facility], [place] [address], [place], [place] [address]   Resp Panel by RT-PCR (Flu A&B, Covid) Nasopharyngeal Swab     Status: None   Collection Time: 01/26/21  1:00 PM   Specimen: Nasopharyngeal Swab; Nasopharyngeal(NP) swabs in vial transport medium  Result Value Ref  Range Status   SARS Coronavirus 2 by RT PCR NEGATIVE NEGATIVE Final    Comment:  (NOTE) SARS-CoV-2 target nucleic acids are NOT DETECTED.  The SARS-CoV-2 RNA is generally detectable in upper respiratory specimens during the acute phase of infection. The lowest concentration of SARS-CoV-2 viral copies this assay can detect is 138 copies/mL. A negative result does not preclude SARS-Cov-2 infection and should not be used as the sole basis for treatment or other patient management decisions. A negative result may occur with  improper specimen collection/handling, submission of specimen other than nasopharyngeal swab, presence of viral mutation(s) within the areas targeted by this assay, and inadequate number of viral copies(<138 copies/mL). A negative result must be combined with clinical observations, patient history, and epidemiological information. The expected result is Negative.  Fact Sheet for Patients:  EntrepreneurPulse.com.au  Fact Sheet for Healthcare Providers:  IncredibleEmployment.be  This test is no t yet approved or cleared by the [place] FDA and  has been authorized for detection and/or diagnosis of SARS-CoV-2 by FDA under an Emergency Use Authorization (EUA). This EUA will remain  in effect (meaning this test can be used) for the duration of the COVID-19 declaration under Section 564(b)(1) of the Act, 21 U.S.C.section 360bbb-3(b)(1), unless the authorization is terminated  or revoked sooner.       Influenza A by PCR NEGATIVE NEGATIVE Final   Influenza B by PCR NEGATIVE NEGATIVE Final    Comment: (NOTE) The Xpert Xpress SARS-CoV-2/FLU/RSV plus assay is intended as an aid in the diagnosis of influenza from Nasopharyngeal swab specimens and should not be used as a sole basis for treatment. Nasal washings and aspirates are unacceptable for Xpert Xpress SARS-CoV-2/FLU/RSV testing.  Fact Sheet for Patients: EntrepreneurPulse.com.au  Fact Sheet for Healthcare  Providers: IncredibleEmployment.be  This test is not yet approved or cleared by the [place] FDA and has been authorized for detection and/or diagnosis of SARS-CoV-2 by FDA under an Emergency Use Authorization (EUA). This EUA will remain in effect (meaning this test can be used) for the duration of the COVID-19 declaration under Section 564(b)(1) of the Act, 21 U.S.C. section 360bbb-3(b)(1), unless the authorization is terminated or revoked.  Performed at [facility], [place] [address], [place], [place] [address]      Radiological Exams on Admission: CT Head Wo Contrast  Result Date: 01/26/2021 CLINICAL DATA:  Altered mental status EXAM: CT HEAD WITHOUT CONTRAST TECHNIQUE: Contiguous axial images were obtained from the base of the skull through the vertex without intravenous contrast. COMPARISON:  January 04, 2021 head CT; December 16, 2020 brain MRI FINDINGS: Brain: Diffuse atrophy is stable. There is no intracranial mass hemorrhage, extra-axial fluid collection, or midline shift. There is decreased attenuation throughout the centra semiovale bilaterally. Decreased attenuation is also noted in portions of the internal and external capsules, stable. These changes are consistent with supratentorial small vessel disease. Small vessel disease also noted in the thalami. No acute infarct is evident on this study. Vascular: There is no appreciable hyperdense vessel. There is calcification in each carotid siphon and distal left vertebral artery region. Skull: The bony calvarium appears intact. Sinuses/Orbits: Patient has undergone previous antrostomy on the left. There is mucosal thickening in several ethmoid air cells. Orbits appear symmetric bilaterally. Other: Mastoid air cells on the left are clear. Postoperative changes involving the mastoids on the right noted with remaining mastoids on the right clear. IMPRESSION: Diffuse atrophy with supratentorial small vessel disease,  stable. No acute infarct evident. No mass  or hemorrhage. Foci of arterial vascular calcification noted. Previous mastoidectomy on the right. Remaining mastoids are clear. Mucosal thickening noted in several ethmoid air cells. Electronically Signed   By: Lowella Grip III M.D.   On: 01/26/2021 14:18   CT CHEST WO CONTRAST  Result Date: 01/26/2021 CLINICAL DATA:  Chest trauma, large swelling mass of the anterior right chest EXAM: CT CHEST WITHOUT CONTRAST TECHNIQUE: Multidetector CT imaging of the chest was performed following the standard protocol without IV contrast. COMPARISON:  01/24/2021 FINDINGS: Cardiovascular: Aortic atherosclerosis. Dense aortic valve calcifications. Cardiomegaly. Dense mitral annulus calcifications. Three-vessel coronary artery calcifications and/or stents. Small pericardial effusion. Mediastinum/Nodes: No enlarged mediastinal, hilar, or axillary lymph nodes. Thyroid gland, trachea, and esophagus demonstrate no significant findings. Lungs/Pleura: Lungs are clear. Small right, trace left pleural effusions, similar to prior examination. Upper Abdomen: No acute abnormality. Musculoskeletal: Large hematoma within the right pectoralis major muscle body measuring 8.3 x 5.4 cm (series 3, image 23). Soft tissue contusion of the right chest wall. No chest wall mass or suspicious bone lesions identified. IMPRESSION: 1. Large hematoma within the right pectoralis major muscle body measuring 8.3 x 5.4 cm. Soft tissue contusion of the right chest wall. 2. Small right, trace left pleural effusions, similar to prior examination. 3. Cardiomegaly and small pericardial effusion, unchanged compared to prior examination. 4. Coronary artery disease. 5. Dense aortic valve calcifications. Dense mitral annulus calcifications. For correlate for echocardiographic evidence of valve dysfunction. Aortic Atherosclerosis (ICD10-I70.0). Electronically Signed   By: Eddie Candle M.D.   On: 01/26/2021 16:07   DG Chest  Port 1 View  Result Date: 01/26/2021 CLINICAL DATA:  Decreased oxygen saturation.  Dyspnea. EXAM: PORTABLE CHEST 1 VIEW COMPARISON:  January 24, 2021 chest radiograph and chest CT FINDINGS: There is no edema or airspace opacity. There is cardiomegaly with pulmonary vascularity normal. No adenopathy. There is mitral annulus calcification as well as calcification in the aorta and in each carotid artery. Degenerative change noted in each shoulder. IMPRESSION: No edema or airspace opacity. Cardiomegaly. There is mitral annulus calcification. There also foci of carotid artery calcification and aortic atherosclerosis. Aortic Atherosclerosis (ICD10-I70.0). Electronically Signed   By: Lowella Grip III M.D.   On: 01/26/2021 14:20    Assessment/Plan Principal Problem:   UTI (urinary tract infection) Active Problems:   Dementia (HCC)   DM2 (diabetes mellitus, type 2) (HCC)   Acute metabolic encephalopathy   Atrial fibrillation, chronic (HCC)   Hematoma (nontraumatic) of breast     #1 acute metabolic encephalopathy: Worsening mental status on top of baseline dementia.  Secondary to UTI.  Will treat underlying UTI.  Continue to monitor patient and treat as well empirically.  Continue supportive care.  #2 recurrent UTIs: Patient will be treated for complex UTIs.  She has been exposed to multiple antibiotics.  This is a healthcare associated UTI.  Meropenem initiated and will follow.  #3 dementia: Stable.  Continue home regimen no agitation.  #4 chronic A. fib: Hold anticoagulation due to hematoma.  Continue rate control.  #5 right anterior wall hematoma: Pectoralis muscle involvement.  General surgery consulted.  Not a surgical case.  #6 diabetes: Sliding scale insulin   DVT prophylaxis: SCD Code Status: Partial code.  No CPR no family 1 intubation and medications Family Communication: Daughter at bedside Disposition Plan: To be determined Consults called: General surgery Dr. Wilburn Cornelia Admission  status: Inpatient  Severity of Illness: The appropriate patient status for this patient is INPATIENT. Inpatient status is judged to be  reasonable and necessary in order to provide the required intensity of service to ensure the patient's safety. The patient's presenting symptoms, physical exam findings, and initial radiographic and laboratory data in the context of their chronic comorbidities is felt to place them at high risk for further clinical deterioration. Furthermore, it is not anticipated that the patient will be medically stable for discharge from the hospital within 2 midnights of admission. The following factors support the patient status of inpatient.   " The patient's presenting symptoms include altered mental status. " The worrisome physical exam findings include confusion and hematoma of the right chest wall. " The initial radiographic and laboratory data are worrisome because of CT findings as well as urinalysis findings. " The chronic co-morbidities include dementia.   * I certify that at the point of admission it is my clinical judgment that the patient will require inpatient hospital care spanning beyond 2 midnights from the point of admission due to high intensity of service, high risk for further deterioration and high frequency of surveillance required.Barbette Merino MD [facility] Pager [phone]  If 7PM-7AM, please contact night-coverage [email or web address] Password [facility]  01/26/2021, 7:28 PM

## 2021-01-27 DIAGNOSIS — N3001 Acute cystitis with hematuria: Secondary | ICD-10-CM | POA: Diagnosis not present

## 2021-01-27 LAB — URINE CULTURE: Culture: <10000 — AB

## 2021-01-27 LAB — GLUCOSE, CAPILLARY
Glucose-Capillary: 144 mg/dL — ABNORMAL HIGH (ref 70–99)
Glucose-Capillary: 188 mg/dL — ABNORMAL HIGH (ref 70–99)
Glucose-Capillary: 167 mg/dL — ABNORMAL HIGH (ref 70–99)
Glucose-Capillary: 193 mg/dL — ABNORMAL HIGH (ref 70–99)

## 2021-01-27 LAB — CBC
HCT: 27.3 % — ABNORMAL LOW (ref 36.0–46.0)
Hemoglobin: 8.7 g/dL — ABNORMAL LOW (ref 12.0–15.0)
MCH: 27.4 pg (ref 26.0–34.0)
MCHC: 31.9 g/dL (ref 30.0–36.0)
MCV: 86.1 fL (ref 80.0–100.0)
Platelets: 320 10*3/uL (ref 150–400)
RBC: 3.17 MIL/uL — ABNORMAL LOW (ref 3.87–5.11)
RDW: 15.4 % (ref 11.5–15.5)
WBC: 17.8 10*3/uL — ABNORMAL HIGH (ref 4.0–10.5)
nRBC: 0.1 % (ref 0.0–0.2)

## 2021-01-27 LAB — COMPREHENSIVE METABOLIC PANEL
ALT: 11 U/L (ref 0–44)
AST: 15 U/L (ref 15–41)
Albumin: 2.5 g/dL — ABNORMAL LOW (ref 3.5–5.0)
Alkaline Phosphatase: 45 U/L (ref 38–126)
Anion gap: 10 (ref 5–15)
BUN: 38 mg/dL — ABNORMAL HIGH (ref 8–23)
CO2: 23 mmol/L (ref 22–32)
Calcium: 9.2 mg/dL (ref 8.9–10.3)
Chloride: 102 mmol/L (ref 98–111)
Creatinine, Ser: 1.57 mg/dL — ABNORMAL HIGH (ref 0.44–1.00)
GFR, Estimated: 31 mL/min — ABNORMAL LOW (ref >60)
Glucose, Bld: 193 mg/dL — ABNORMAL HIGH (ref 70–99)
Potassium: 4.6 mmol/L (ref 3.5–5.1)
Sodium: 135 mmol/L (ref 135–145)
Total Bilirubin: 1 mg/dL (ref 0.3–1.2)
Total Protein: 6 g/dL — ABNORMAL LOW (ref 6.5–8.1)

## 2021-01-27 MED ORDER — ATORVASTATIN CALCIUM 40 MG PO TABS
40.0000 mg | ORAL_TABLET | Freq: Every day | ORAL | Status: DC
  Administered 2021-01-27 – 2021-02-02 (×7): 40 mg via ORAL
  Filled 2021-01-27 (×7): qty 1

## 2021-01-27 MED ORDER — MUPIROCIN 2 % EX OINT
1.0000 "application " | TOPICAL_OINTMENT | Freq: Every day | CUTANEOUS | Status: DC | PRN
  Filled 2021-01-27: qty 22

## 2021-01-27 MED ORDER — DONEPEZIL HCL 10 MG PO TABS
10.0000 mg | ORAL_TABLET | Freq: Every day | ORAL | Status: DC
  Administered 2021-01-27 – 2021-02-01 (×6): 10 mg via ORAL
  Filled 2021-01-27 (×6): qty 1

## 2021-01-27 MED ORDER — MELATONIN 3 MG PO TABS
3.0000 mg | ORAL_TABLET | Freq: Every day | ORAL | Status: DC
  Administered 2021-01-27 – 2021-02-01 (×6): 3 mg via ORAL
  Filled 2021-01-27 (×6): qty 1

## 2021-01-27 MED ORDER — CHLORHEXIDINE GLUCONATE CLOTH 2 % EX PADS
6.0000 | MEDICATED_PAD | Freq: Every day | CUTANEOUS | Status: DC
  Administered 2021-01-27 – 2021-02-02 (×5): 6 via TOPICAL

## 2021-01-27 NOTE — Plan of Care (Signed)

## 2021-01-27 NOTE — Progress Notes (Signed)
Spoke with pt's daughter [name] at bedside to verify patient's code status DNR, per daughter she want to do all life saving measures, like intubation and medications except only NO CHEST COMPRESSIONS. Sent secure chat to MD Jonelle Sidle and made him  aware.

## 2021-01-27 NOTE — Progress Notes (Signed)
Subjective/Chief Complaint: No acute changes CW hematoma appears same size   Objective: Vital signs in last 24 hours: Temp:  [97.2 F (36.2 C)-99.9 F (37.7 C)] 98.3 F (36.8 C) (04/10 0827) Pulse Rate:  [62-107] 62 (04/10 0827) Resp:  [11-30] 18 (04/10 0827) BP: (86-156)/(41-80) 105/53 (04/10 0827) SpO2:  [94 %-100 %] 100 % (04/10 0827) Weight:  [50.3 kg] 50.3 kg (04/09 1237) Last BM Date: 01/26/21  Intake/Output from previous day: 04/09 0701 - 04/10 0700 In: 1322.6 [P.O.:300; I.V.:521.3; Blood:388; IV Piggyback:113.3] Out: 450 [Urine:450] Intake/Output this shift: No intake/output data recorded.  Physical Exam: BP (!) 105/53 (BP Location: Left Arm)   Pulse 62   Temp 98.3 F (36.8 C) (Oral)   Resp 18   Wt 50.3 kg   SpO2 100%   BMI 19.03 kg/m  General: resting comfortably, appears stated age, no apparent distress Neurological: alert, no focal deficits HEENT: normocephalic, atraumatic, no scleral icterus CV: regular rate and rhythm, extremities warm and well-perfused Respiratory: normal work of breathing, symmetric chest wall expansion. Large subcutaneous mass on right upper lateral chest wall, nontender to palpation. Overlying skin is normal without erythema, induration or ecchymosis. Extremities: warm and well-perfused. Area of ecchymosis on right lower leg. Skin: warm and dry, no jaundice, no rashes or lesions  Lab Results:  Recent Labs    01/26/21 1228 01/27/21 0254  WBC 12.6* 17.8*  HGB 7.1* 8.7*  HCT 24.3* 27.3*  PLT 398 320   BMET Recent Labs    01/26/21 1228 01/27/21 0254  NA 139 135  K 4.5 4.6  CL 106 102  CO2 26 23  GLUCOSE 269* 193*  BUN 32* 38*  CREATININE 1.55* 1.57*  CALCIUM 9.5 9.2   PT/INR Recent Labs    01/26/21 1228  LABPROT 21.6*  INR 2.0*   ABG No results for input(s): PHART, HCO3 in the last 72 hours.  Invalid input(s): PCO2, PO2  Studies/Results: CT Head Wo Contrast  Result Date: 01/26/2021 CLINICAL DATA:   Altered mental status EXAM: CT HEAD WITHOUT CONTRAST TECHNIQUE: Contiguous axial images were obtained from the base of the skull through the vertex without intravenous contrast. COMPARISON:  January 04, 2021 head CT; December 16, 2020 brain MRI FINDINGS: Brain: Diffuse atrophy is stable. There is no intracranial mass hemorrhage, extra-axial fluid collection, or midline shift. There is decreased attenuation throughout the centra semiovale bilaterally. Decreased attenuation is also noted in portions of the internal and external capsules, stable. These changes are consistent with supratentorial small vessel disease. Small vessel disease also noted in the thalami. No acute infarct is evident on this study. Vascular: There is no appreciable hyperdense vessel. There is calcification in each carotid siphon and distal left vertebral artery region. Skull: The bony calvarium appears intact. Sinuses/Orbits: Patient has undergone previous antrostomy on the left. There is mucosal thickening in several ethmoid air cells. Orbits appear symmetric bilaterally. Other: Mastoid air cells on the left are clear. Postoperative changes involving the mastoids on the right noted with remaining mastoids on the right clear. 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 No acute infarct evident. No mass or hemorrhage. Foci of arterial vascular calcification noted. Previous mastoidectomy on the right. Remaining mastoids are clear. Mucosal thickening noted in several ethmoid air cells. Electronically Signed   By: Lowella Grip III M.D.   On: 01/26/2021 14:18   CT CHEST WO CONTRAST  Result Date: 01/26/2021 CLINICAL DATA:  Chest trauma, large swelling mass of the anterior right chest EXAM:  CT CHEST WITHOUT CONTRAST TECHNIQUE: Multidetector CT imaging of the chest was performed following the standard protocol without IV contrast. COMPARISON:  01/24/2021 FINDINGS: Cardiovascular: Aortic atherosclerosis. Dense  aortic valve calcifications. Cardiomegaly. Dense mitral annulus calcifications. Three-vessel coronary artery calcifications and/or stents. Small pericardial effusion. Mediastinum/Nodes: No enlarged mediastinal, hilar, or axillary lymph nodes. Thyroid gland, trachea, and esophagus demonstrate no significant findings. Lungs/Pleura: Lungs are clear. Small right, trace left pleural effusions, similar to prior examination. Upper Abdomen: No acute abnormality. Musculoskeletal: Large hematoma within the right pectoralis major muscle body measuring 8.3 x 5.4 cm (series 3, image 23). Soft tissue contusion of the right chest wall. No chest wall mass or suspicious bone lesions identified. IMPRESSION: 1. Large hematoma within the right pectoralis major muscle body measuring 8.3 x 5.4 cm. Soft tissue contusion of the right chest wall. 2. Small right, trace left pleural effusions, similar to prior examination. 3. Cardiomegaly and small pericardial effusion, unchanged compared to prior examination. 4. Coronary artery disease. 5. Dense aortic valve calcifications. Dense mitral annulus calcifications. For correlate for echocardiographic evidence of valve dysfunction. Aortic Atherosclerosis (ICD10-I70.0). Electronically Signed   By: Eddie Candle M.D.   On: 01/26/2021 16:07   DG Chest Port 1 View  Result Date: 01/26/2021 CLINICAL DATA:  Decreased oxygen saturation.  Dyspnea. EXAM: PORTABLE CHEST 1 VIEW COMPARISON:  January 24, 2021 chest radiograph and chest CT FINDINGS: There is no edema or airspace opacity. There is cardiomegaly with pulmonary vascularity normal. No adenopathy. There is mitral annulus calcification as well as calcification in the aorta and in each carotid artery. Degenerative change noted in each shoulder. IMPRESSION: No edema or airspace opacity. Cardiomegaly. There is mitral annulus calcification. There also foci of carotid artery calcification and aortic atherosclerosis. Aortic Atherosclerosis (ICD10-I70.0).  Electronically Signed   By: Lowella Grip III M.D.   On: 01/26/2021 14:20    Anti-infectives: Anti-infectives (From admission, onward)   Start     Dose/Rate Route Frequency Ordered Stop   01/26/21 1830  meropenem (MERREM) 500 mg in sodium chloride 0.9 % 100 mL IVPB        500 mg 200 mL/hr over 30 Minutes Intravenous Every 12 hours 01/26/21 1806        Assessment/Plan: 71F with R chest wall hematoma Afib- eliquis UTI  Dementia DM  At this time pt with stable chest wall hematoma likely d/t trauma No need for drainage at this time Holding eliquis No surgical plans.  Call back if needed.  LOS: 1 day    Ralene Ok 01/27/2021

## 2021-01-27 NOTE — Progress Notes (Signed)
New Admission Note:   Arrival Method: from ED via stretcher Mental Orientation: alert and oriented to self, disoriented to place, time and location Telemetry: N/A Assessment: to be completed Skin: refer to flowsheet IV: LAC, infusing with 1"u" RBC Pain: PAINAD, refer to flowsheet Tubes: None Safety Measures: Safety Fall Prevention Plan has been discussed  Admission: to be completed [facility] [place] Orientation: Patient has been oriented to the room, unit and staff.   Family: daughter at bedside  Orders to be reviewed and implemented. Will continue to monitor the patient. Call light has been placed within reach and bed alarm has been activated.

## 2021-01-27 NOTE — Progress Notes (Signed)
PROGRESS NOTE    [name]  MIW:[id number] DOB: 05-05-1929 DOA: 01/26/2021 PCP: Vicenta Aly, FNP   Chief Complaint  Patient presents with  . Altered Mental Status  Brief Narrative: 87 year old female is seen within medical history of dementia, diabetes, coronary artery disease, hypertension, CVA on Eliquis brought due to weakness and right chest wall swelling as well as  altered mental status. On admission patient daughter reported multiple urinary tract infection lately, history of DVT. EMS was called she was hypoxic to 70% per EMS.  In the ED patient was lethargic. Blood pressure was soft 86/66, with leukocytosis abnormal UA, lactic acidosis elevated creatinine patient was admitted.  Her hemoglobin had dropped to 7.1 g and 1 unit.  She was transfused on admission. Surgery was consulted for hematoma in the chest wall.  Patient placed on meropenem and admitted for UTI and severe sepsis  Subjective: Seen and examined this morning. Overnight T-max 99.9, on 1.5 to 2 L nasal cannula saturating well-blood pressure stable although was 86/66 transiently last night. Patient is deaf, blind, with contractures. Appears ill frail. Only hears from left ear.  Assessment & Plan:  Severe Sepsis POA 2/2 UTI w/ leukocytosis: WBC worsening no temperature spike ,pt also with lactic acidosis AKI suspecting severe sepsis 2/2 UTI.  Urine and blood culture 4/0 pending. Cont on IV meropenem due to history of ESBL. Recent Labs  Lab 01/24/21 1520 01/26/21 1228 01/26/21 1610 01/26/21 2121 01/27/21 0254  WBC 7.6 12.6*  --   --  17.8*  LATICACIDVEN  --   --  2.4* 2.7*  --    Chest wall hematoma, CT shows a large mass in the right pectoralis muscle well-circumscribed consistent with hematoma likely from local trauma.  Seen by vascular.  Currently no indication for drainage of the hematoma in the absence of infection, overlying skin breakdown or significant pain, surgery advised to hold  anticoagulation for now to ensure hematoma is not expanding.  Surgery following.  Acute metabolic encephalopathy in the setting of dementia with worsening mental status likely multifactorial with sepsis UTI AKI related: cont on supportive measures fall precaution treatment of underlying UTI.  Reorientation and delirium precaution.  Chronic hypoxia needing 2 L nasal cannula at bedtime at home.  AKI, baseline creatinine 0.7 01/24/2021, and 1.5 since admission. Recent Labs  Lab 01/24/21 1520 01/26/21 1228 01/27/21 0254  BUN 15 32* 38*  CREATININE 0.66 1.55* 1.57*   Chronic atrial fibrillation/history of DVT rate controll, anticoagulation on hold due to chest wall hematoma and blood loss anemia.  Acute blood loss anemia due to chest wall hematoma status post 1 unit PRBC transfusion.  Monitor H&H. Recent Labs  Lab 01/24/21 1520 01/26/21 1228 01/27/21 0254  HGB 9.3* 7.1* 8.7*  HCT 31.1* 24.3* 27.3*   DM2: Hemoglobin A1c 6.6 back in February 2022, blood sugar borderline controlled continue sliding scale insulin Recent Labs  Lab 01/26/21 1214 01/26/21 2132 01/27/21 0657  GLUCAP 206* 221* 144*   GNO:IBBCWUG code.Will consult palliative care given her complex comorbidities, dementia AND  advanced age as prognosis appears to be guarded. I spoke to Daughter- she spoke to palliative care last week and f/u was pending. She agrees for palliative care evaluation while here.  Diet Order            Diet heart healthy/carb modified Room service appropriate? Yes; Fluid consistency: Thin  Diet effective now               Patient's Body mass index is  19.03 kg/m.  DVT prophylaxis: SCDs Start: 01/26/21 2141 Code Status:   Code Status: DNR  Family Communication: plan of care discussed with patient's daughter [name] over the phone.    Status is: Inpatient Remains inpatient appropriate because:Inpatient level of care appropriate due to severity of illness Dispo: The patient is from:  Home              Anticipated d/c is to:TBD               Patient currently is not medically stable to d/c.   Difficult to place patient No Unresulted Labs (From admission, onward)          Start     Ordered   01/26/21 1550  Urine Culture  ONCE - STAT,   STAT        01/26/21 1549   01/26/21 1228  Blood culture (routine x 2)  BLOOD CULTURE X 2,   STAT      01/26/21 1229          Medications reviewed:  Scheduled Meds: . Chlorhexidine Gluconate Cloth  6 each Topical Daily  . insulin aspart  0-5 Units Subcutaneous QHS  . insulin aspart  0-9 Units Subcutaneous TID WC   Continuous Infusions: . sodium chloride 75 mL/hr at 01/26/21 2303  . meropenem (MERREM) IV 500 mg (01/27/21 4481)    Consultants:see note  Procedures:see note  Antimicrobials: Anti-infectives (From admission, onward)   Start     Dose/Rate Route Frequency Ordered Stop   01/26/21 1830  meropenem (MERREM) 500 mg in sodium chloride 0.9 % 100 mL IVPB        500 mg 200 mL/hr over 30 Minutes Intravenous Every 12 hours 01/26/21 1806       Culture/Microbiology    Component Value Date/Time   SDES  01/24/2021 1618    URINE, RANDOM Performed at [facility], [place] [address], [place], [place] [address]    SPECREQUEST  01/24/2021 1618    NONE Performed at [facility], [place] [address] [place], [place] [address]    CULT (A) 01/24/2021 1618    <10,000 COLONIES/mL INSIGNIFICANT GROWTH Performed at [facility] [address], [place], [place] [address]    REPTSTATUS 01/26/2021 FINAL 01/24/2021 1618    Other culture-see note  Objective: Vitals: Today's Vitals   01/26/21 2130 01/26/21 2320 01/27/21 0052 01/27/21 0451  BP: (!) 129/56 (!) 130/52 128/63 137/71  Pulse: 91 85 81 87  Resp: 16 16 16 17   Temp: 98.3 F (36.8 C) 98.1 F (36.7 C) 98 F (36.7 C) (!) 97.5 F (36.4 C)  TempSrc: Oral Oral Oral Oral  SpO2: 100% 100% 100% 100%  Weight:      PainSc: 0-No pain        Intake/Output Summary (Last 24 hours) at 01/27/2021 0819 Last data filed at 01/27/2021 8850 Gross per 24 hour  Intake 1322.58 ml  Output 450 ml  Net 872.58 ml   Filed Weights   01/26/21 1237  Weight: 50.3 kg   Weight change:   Intake/Output from previous day: 04/09 0701 - 04/10 0700 In: 1322.6 [P.O.:300; I.V.:521.3; Blood:388; IV Piggyback:113.3] Out: 450 [Urine:450] Intake/Output this shift: No intake/output data recorded. Filed Weights   01/26/21 1237  Weight: 50.3 kg    Examination: General exam:  deaf, blind, with contractures. Appears ill frail. Was alert awake but not responding but tracking with head. HEENT:Oral mucosa dry, Ear/Nose WNL grossly,dentition normal. Respiratory system: bilaterally diminished,no  use of accessory muscle, non tender. Cardiovascular system: S1 & S2 +, regular, No JVD. Gastrointestinal system: Abdomen soft, NT,ND, BS+. Nervous System:Alert, awake, not following commands instruction, in fetal position with contractures.  Extremities: Trace leg edema, distal peripheral pulses palpable.  Skin: No rashes,no icterus. MSK: Normal muscle bulk,tone, power Right chest wall swelling with no skin breakdown.  Data Reviewed: I have personally reviewed following labs and imaging studies CBC: Recent Labs  Lab 01/24/21 1520 01/26/21 1228 01/27/21 0254  WBC 7.6 12.6* 17.8*  NEUTROABS 5.6 10.2*  --   HGB 9.3* 7.1* 8.7*  HCT 31.1* 24.3* 27.3*  MCV 85.7 87.4 86.1  PLT 302 398 710   Basic Metabolic Panel: Recent Labs  Lab 01/24/21 1520 01/26/21 1228 01/27/21 0254  NA 138 139 135  K 3.4* 4.5 4.6  CL 107 106 102  CO2 23 26 23   GLUCOSE 147* 269* 193*  BUN 15 32* 38*  CREATININE 0.66 1.55* 1.57*  CALCIUM 9.6 9.5 9.2   GFR: Estimated Creatinine Clearance: 19.3 mL/min (A) (by C-G formula based on SCr of 1.57 mg/dL (H)). Liver Function Tests: Recent Labs  Lab 01/24/21 1520 01/26/21 1228 01/27/21 0254  AST 21 16 15   ALT 15 11 11    ALKPHOS 47 43 45  BILITOT 0.9 0.5 1.0  PROT 7.0 6.0* 6.0*  ALBUMIN 2.8* 2.3* 2.5*   No results for input(s): LIPASE, AMYLASE in the last 168 hours. No results for input(s): AMMONIA in the last 168 hours. Coagulation Profile: Recent Labs  Lab 01/26/21 1228  INR 2.0*   Cardiac Enzymes: No results for input(s): CKTOTAL, CKMB, CKMBINDEX, TROPONINI in the last 168 hours. BNP (last 3 results) No results for input(s): PROBNP in the last 8760 hours. HbA1C: No results for input(s): HGBA1C in the last 72 hours. CBG: Recent Labs  Lab 01/26/21 1214 01/26/21 2132 01/27/21 0657  GLUCAP 206* 221* 144*   Lipid Profile: No results for input(s): CHOL, HDL, LDLCALC, TRIG, CHOLHDL, LDLDIRECT in the last 72 hours. Thyroid Function Tests: Recent Labs    01/24/21 1521  TSH 0.981   Anemia Panel: No results for input(s): VITAMINB12, FOLATE, FERRITIN, TIBC, IRON, RETICCTPCT in the last 72 hours. Sepsis Labs: Recent Labs  Lab 01/26/21 1610 01/26/21 2121  LATICACIDVEN 2.4* 2.7*    Recent Results (from the past 240 hour(s))  Culture, blood (routine x 2)     Status: None   Collection Time: 01/17/21  2:25 PM   Specimen: BLOOD  Result Value Ref Range Status   Specimen Description   Final    BLOOD LEFT ANTECUBITAL Performed at [facility] [address] [place], [place] [address]    Special Requests   Final    BOTTLES DRAWN AEROBIC AND ANAEROBIC Blood Culture results may not be optimal due to an inadequate volume of blood received in culture bottles Performed at [facility] [address] [place], [place] [address]    Culture   Final    NO GROWTH 5 DAYS Performed at [facility], [place] [address], [place], [place] [address]    Report Status 01/22/2021 FINAL  Final  Urine culture     Status: Abnormal   Collection Time: 01/17/21  5:42 PM   Specimen: Urine, Random  Result Value Ref Range Status   Specimen Description   Final    URINE,  RANDOM Performed at [facility] [address], [place], [place] [address]    Special Requests  Final    NONE Performed at [facility], [place] [address], [place], [place] [address]    Culture (A)  Final    <10,000 COLONIES/mL INSIGNIFICANT GROWTH Performed at [facility] [address] [place], [place] [address]    Report Status 01/19/2021 FINAL  Final  Urine culture     Status: Abnormal   Collection Time: 01/24/21  4:18 PM   Specimen: Urine, Random  Result Value Ref Range Status   Specimen Description   Final    URINE, RANDOM Performed at [facility] [address], [place], [place] [address]    Special Requests   Final    NONE Performed at [facility], [place] [address] [place], [place] [address]    Culture (A)  Final    <10,000 COLONIES/mL INSIGNIFICANT GROWTH Performed at [facility] [address] [place], [place] [address]    Report Status 01/26/2021 FINAL  Final  Resp Panel by RT-PCR (Flu A&B, Covid) Nasopharyngeal Swab     Status: None   Collection Time: 01/24/21  4:33 PM   Specimen: Nasopharyngeal Swab; Nasopharyngeal(NP) swabs in vial transport medium  Result Value Ref Range Status   SARS Coronavirus 2 by RT PCR NEGATIVE NEGATIVE Final    Comment: (NOTE) SARS-CoV-2 target nucleic acids are NOT DETECTED.  The SARS-CoV-2 RNA is generally detectable in upper respiratory specimens during the acute phase of infection. The lowest concentration of SARS-CoV-2 viral copies this assay can detect is 138 copies/mL. A negative result does not preclude SARS-Cov-2 infection and should not be used as the sole basis for treatment or other patient management decisions. A negative result may occur with  improper specimen collection/handling, submission of specimen other than nasopharyngeal swab, presence of viral mutation(s) within the areas targeted by this assay, and inadequate  number of viral copies(<138 copies/mL). A negative result must be combined with clinical observations, patient history, and epidemiological information. The expected result is Negative.  Fact Sheet for Patients:  EntrepreneurPulse.com.au  Fact Sheet for Healthcare Providers:  IncredibleEmployment.be  This test is no t yet approved or cleared by the [place] FDA and  has been authorized for detection and/or diagnosis of SARS-CoV-2 by FDA under an Emergency Use Authorization (EUA). This EUA will remain  in effect (meaning this test can be used) for the duration of the COVID-19 declaration under Section 564(b)(1) of the Act, 21 U.S.C.section 360bbb-3(b)(1), unless the authorization is terminated  or revoked sooner.       Influenza A by PCR NEGATIVE NEGATIVE Final   Influenza B by PCR NEGATIVE NEGATIVE Final    Comment: (NOTE) The Xpert Xpress SARS-CoV-2/FLU/RSV plus assay is intended as an aid in the diagnosis of influenza from Nasopharyngeal swab specimens and should not be used as a sole basis for treatment. Nasal washings and aspirates are unacceptable for Xpert Xpress SARS-CoV-2/FLU/RSV testing.  Fact Sheet for Patients: EntrepreneurPulse.com.au  Fact Sheet for Healthcare Providers: IncredibleEmployment.be  This test is not yet approved or cleared by the [place] FDA and has been authorized for detection and/or diagnosis of SARS-CoV-2 by FDA under an Emergency Use Authorization (EUA). This EUA will remain in effect (meaning this test can be used) for the duration of the COVID-19 declaration under Section 564(b)(1) of the Act, 21 U.S.C. section 360bbb-3(b)(1), unless the authorization is terminated or revoked.  Performed at [facility], [place] [address] [place], [place] [address]   Resp Panel by RT-PCR (Flu  A&B, Covid) Nasopharyngeal Swab     Status: None   Collection  Time: 01/26/21  1:00 PM   Specimen: Nasopharyngeal Swab; Nasopharyngeal(NP) swabs in vial transport medium  Result Value Ref Range Status   SARS Coronavirus 2 by RT PCR NEGATIVE NEGATIVE Final    Comment: (NOTE) SARS-CoV-2 target nucleic acids are NOT DETECTED.  The SARS-CoV-2 RNA is generally detectable in upper respiratory specimens during the acute phase of infection. The lowest concentration of SARS-CoV-2 viral copies this assay can detect is 138 copies/mL. A negative result does not preclude SARS-Cov-2 infection and should not be used as the sole basis for treatment or other patient management decisions. A negative result may occur with  improper specimen collection/handling, submission of specimen other than nasopharyngeal swab, presence of viral mutation(s) within the areas targeted by this assay, and inadequate number of viral copies(<138 copies/mL). A negative result must be combined with clinical observations, patient history, and epidemiological information. The expected result is Negative.  Fact Sheet for Patients:  EntrepreneurPulse.com.au  Fact Sheet for Healthcare Providers:  IncredibleEmployment.be  This test is no t yet approved or cleared by the [place] FDA and  has been authorized for detection and/or diagnosis of SARS-CoV-2 by FDA under an Emergency Use Authorization (EUA). This EUA will remain  in effect (meaning this test can be used) for the duration of the COVID-19 declaration under Section 564(b)(1) of the Act, 21 U.S.C.section 360bbb-3(b)(1), unless the authorization is terminated  or revoked sooner.       Influenza A by PCR NEGATIVE NEGATIVE Final   Influenza B by PCR NEGATIVE NEGATIVE Final    Comment: (NOTE) The Xpert Xpress SARS-CoV-2/FLU/RSV plus assay is intended as an aid in the diagnosis of influenza from Nasopharyngeal swab specimens and should not be used as a sole basis for treatment. Nasal washings  and aspirates are unacceptable for Xpert Xpress SARS-CoV-2/FLU/RSV testing.  Fact Sheet for Patients: EntrepreneurPulse.com.au  Fact Sheet for Healthcare Providers: IncredibleEmployment.be  This test is not yet approved or cleared by the [place] FDA and has been authorized for detection and/or diagnosis of SARS-CoV-2 by FDA under an Emergency Use Authorization (EUA). This EUA will remain in effect (meaning this test can be used) for the duration of the COVID-19 declaration under Section 564(b)(1) of the Act, 21 U.S.C. section 360bbb-3(b)(1), unless the authorization is terminated or revoked.  Performed at [facility], [place] [address], [place], [place] [address]      Radiology Studies: CT Head Wo Contrast  Result Date: 01/26/2021 CLINICAL DATA:  Altered mental status EXAM: CT HEAD WITHOUT CONTRAST TECHNIQUE: Contiguous axial images were obtained from the base of the skull through the vertex without intravenous contrast. COMPARISON:  January 04, 2021 head CT; December 16, 2020 brain MRI FINDINGS: Brain: Diffuse atrophy is stable. There is no intracranial mass hemorrhage, extra-axial fluid collection, or midline shift. There is decreased attenuation throughout the centra semiovale bilaterally. Decreased attenuation is also noted in portions of the internal and external capsules, stable. These changes are consistent with supratentorial small vessel disease. Small vessel disease also noted in the thalami. No acute infarct is evident on this study. Vascular: There is no appreciable hyperdense vessel. There is calcification in each carotid siphon and distal left vertebral artery region. Skull: The bony calvarium appears intact. Sinuses/Orbits: Patient has undergone previous antrostomy on the left. There is mucosal thickening in several ethmoid air cells. Orbits appear symmetric bilaterally. Other: Mastoid air cells on the left are clear. Postoperative  changes involving the mastoids on the right noted with remaining mastoids on the right clear. 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 No acute infarct evident. No mass or hemorrhage. Foci of arterial vascular calcification noted. Previous mastoidectomy on the right. Remaining mastoids are clear. Mucosal thickening noted in several ethmoid air cells. Electronically Signed   By: Lowella Grip III M.D.   On: 01/26/2021 14:18   CT CHEST WO CONTRAST  Result Date: 01/26/2021 CLINICAL DATA:  Chest trauma, large swelling mass of the anterior right chest EXAM: CT CHEST WITHOUT CONTRAST TECHNIQUE: Multidetector CT imaging of the chest was performed following the standard protocol without IV contrast. COMPARISON:  01/24/2021 FINDINGS: Cardiovascular: Aortic atherosclerosis. Dense aortic valve calcifications. Cardiomegaly. Dense mitral annulus calcifications. Three-vessel coronary artery calcifications and/or stents. Small pericardial effusion. Mediastinum/Nodes: No enlarged mediastinal, hilar, or axillary lymph nodes. Thyroid gland, trachea, and esophagus demonstrate no significant findings. Lungs/Pleura: Lungs are clear. Small right, trace left pleural effusions, similar to prior examination. Upper Abdomen: No acute abnormality. Musculoskeletal: Large hematoma within the right pectoralis major muscle body measuring 8.3 x 5.4 cm (series 3, image 23). Soft tissue contusion of the right chest wall. No chest wall mass or suspicious bone lesions identified. IMPRESSION: 1. Large hematoma within the right pectoralis major muscle body measuring 8.3 x 5.4 cm. Soft tissue contusion of the right chest wall. 2. Small right, trace left pleural effusions, similar to prior examination. 3. Cardiomegaly and small pericardial effusion, unchanged compared to prior examination. 4. Coronary artery disease. 5. Dense aortic valve calcifications. Dense mitral annulus calcifications. For correlate for  echocardiographic evidence of valve dysfunction. Aortic Atherosclerosis (ICD10-I70.0). Electronically Signed   By: Eddie Candle M.D.   On: 01/26/2021 16:07   DG Chest Port 1 View  Result Date: 01/26/2021 CLINICAL DATA:  Decreased oxygen saturation.  Dyspnea. EXAM: PORTABLE CHEST 1 VIEW COMPARISON:  January 24, 2021 chest radiograph and chest CT FINDINGS: There is no edema or airspace opacity. There is cardiomegaly with pulmonary vascularity normal. No adenopathy. There is mitral annulus calcification as well as calcification in the aorta and in each carotid artery. Degenerative change noted in each shoulder. IMPRESSION: No edema or airspace opacity. Cardiomegaly. There is mitral annulus calcification. There also foci of carotid artery calcification and aortic atherosclerosis. Aortic Atherosclerosis (ICD10-I70.0). Electronically Signed   By: Lowella Grip III M.D.   On: 01/26/2021 14:20     LOS: 1 day   Antonieta Pert, MD [facility]  01/27/2021, 8:19 AM

## 2021-01-27 NOTE — Plan of Care (Signed)
  Problem: Nutrition: Goal: Adequate nutrition will be maintained Outcome: Progressing   Problem: Elimination: Goal: Will not experience complications related to urinary retention Outcome: Progressing   

## 2021-01-28 DIAGNOSIS — N3001 Acute cystitis with hematuria: Secondary | ICD-10-CM | POA: Diagnosis not present

## 2021-01-28 LAB — GLUCOSE, CAPILLARY
Glucose-Capillary: 167 mg/dL — ABNORMAL HIGH (ref 70–99)
Glucose-Capillary: 152 mg/dL — ABNORMAL HIGH (ref 70–99)
Glucose-Capillary: 126 mg/dL — ABNORMAL HIGH (ref 70–99)
Glucose-Capillary: 155 mg/dL — ABNORMAL HIGH (ref 70–99)

## 2021-01-28 LAB — BASIC METABOLIC PANEL
Anion gap: 4 — ABNORMAL LOW (ref 5–15)
BUN: 30 mg/dL — ABNORMAL HIGH (ref 8–23)
CO2: 27 mmol/L (ref 22–32)
Calcium: 9.1 mg/dL (ref 8.9–10.3)
Chloride: 107 mmol/L (ref 98–111)
Creatinine, Ser: 1.23 mg/dL — ABNORMAL HIGH (ref 0.44–1.00)
GFR, Estimated: 42 mL/min — ABNORMAL LOW (ref >60)
Glucose, Bld: 193 mg/dL — ABNORMAL HIGH (ref 70–99)
Potassium: 4.1 mmol/L (ref 3.5–5.1)
Sodium: 138 mmol/L (ref 135–145)

## 2021-01-28 LAB — PREPARE RBC (CROSSMATCH)

## 2021-01-28 LAB — CBC
HCT: 21 % — ABNORMAL LOW (ref 36.0–46.0)
Hemoglobin: 6.6 g/dL — CL (ref 12.0–15.0)
MCH: 27.4 pg (ref 26.0–34.0)
MCHC: 31.4 g/dL (ref 30.0–36.0)
MCV: 87.1 fL (ref 80.0–100.0)
Platelets: 262 10*3/uL (ref 150–400)
RBC: 2.41 MIL/uL — ABNORMAL LOW (ref 3.87–5.11)
RDW: 15.8 % — ABNORMAL HIGH (ref 11.5–15.5)
WBC: 10.9 10*3/uL — ABNORMAL HIGH (ref 4.0–10.5)
nRBC: 0 % (ref 0.0–0.2)

## 2021-01-28 LAB — HEMOGLOBIN AND HEMATOCRIT, BLOOD
HCT: 25.8 % — ABNORMAL LOW (ref 36.0–46.0)
Hemoglobin: 8.4 g/dL — ABNORMAL LOW (ref 12.0–15.0)

## 2021-01-28 MED ORDER — ACETAMINOPHEN 325 MG PO TABS
650.0000 mg | ORAL_TABLET | Freq: Once | ORAL | Status: AC
  Administered 2021-01-28: 650 mg via ORAL
  Filled 2021-01-28: qty 2

## 2021-01-28 MED ORDER — SODIUM CHLORIDE 0.9% IV SOLUTION: Freq: Once | INTRAVENOUS | Status: DC

## 2021-01-28 MED ORDER — DIPHENHYDRAMINE HCL 25 MG PO CAPS
25.0000 mg | ORAL_CAPSULE | Freq: Once | ORAL | Status: AC
  Administered 2021-01-28: 25 mg via ORAL
  Filled 2021-01-28: qty 1

## 2021-01-28 MED ORDER — SODIUM CHLORIDE 0.9 % IV SOLN
1.0000 g | INTRAVENOUS | Status: AC
  Administered 2021-01-28 – 2021-01-30 (×3): 1 g via INTRAVENOUS
  Filled 2021-01-28 (×3): qty 10

## 2021-01-28 NOTE — Progress Notes (Signed)
Patient unable to answer questions at this time. Unable to complete admission assessment.

## 2021-01-28 NOTE — Progress Notes (Addendum)
Vital signs taken at 1144 patient HR was SB at 48 on dinamap, MD was notified and MD gave verbal orders to complete EKG RN implement interventions and will continue to monitor this patient.

## 2021-01-28 NOTE — Progress Notes (Signed)
3 separate lab technicians notified RN that they were unable to successfully draw labs on this patient for multiples lab orders, MD has been notified, no new orders have been placed at this time and RN will continue to monitor this patient.

## 2021-01-28 NOTE — Progress Notes (Addendum)
PROGRESS NOTE    [name]  EYE:[id number] DOB: [birth year] DOA: 01/26/2021 PCP: Vicenta Aly, FNP   Chief Complaint  Patient presents with  . Altered Mental Status  Brief Narrative: 87 year old female is seen within medical history of dementia, diabetes, coronary artery disease, hypertension, CVA on Eliquis brought due to weakness and right chest wall swelling as well as  altered mental status. On admission patient daughter reported multiple urinary tract infection lately, history of DVT. EMS was called she was hypoxic to 70% per EMS.  In the ED patient was lethargic. Blood pressure was soft 86/66, with leukocytosis abnormal UA, lactic acidosis elevated creatinine patient was admitted.  Her hemoglobin had dropped to 7.1 g and was transfused 1 units on admission. Surgery was consulted for hematoma in the chest wall.  Patient placed on meropenem and admitted for UTI and severe sepsis. H&H dropped further assessment 6.6 g on 4/11 and additional 1 more unit ordered  Subjective: Seen and examined this morning.  Overall unchanged able to move her head very hard of hearing does not communicate. H&H dropped further assessment 6.6 g on 4/11 and additional 1 more unit ordered Only hears from left ear. On oxygen 1.5 L,afebrile, blood pressure in 100-110 overnight Seen again, HR in 40s-50s Not in distress- EKG shows a fib with slow HR. unable to draw labs-tropo-discussed with daughter-  unlikely candidate for cath, we discussed and will hold off on troponins. H/h post transfusion improved.  She is wondering abt changing to abx with "C" as she did better on it last time  Assessment & Plan:  Suspected Severe Sepsis POA 2/2 recurrent UTI w/ leukocytosis, lactic acidosis, hypotension: Has been afebrile, leukocytosis improved Suspecting severe sepsis 2/2 UTI. UA on admission wbc >50, rbc >50, yeast +, non sp epith +, nitrite + and LE large.Blood culture no growth so far, urine culture less  than 10,000 colonies insignificant growth.Remains on IV meropenem due to history of ESBL for now.She was previously seen by infectious disease-last office visit 2/28 by Dr. Juleen China. She is also followed by urology as OP.  We will discontinue the Foley catheter and do voiding trial. Deescalate abx to Rocephin.Thanks.  Recent Labs  Lab 01/24/21 1520 01/26/21 1228 01/26/21 1610 01/26/21 2121 01/27/21 0254 01/28/21 0341  WBC 7.6 12.6*  --   --  17.8* 10.9*  LATICACIDVEN  --   --  2.4* 2.7*  --   --    Chest wall hematoma:CT shows a large mass in the right pectoralis muscle well-circumscribed consistent with hematoma likely from local trauma.  Seen by vascular.  Currently no indication for drainage of the hematoma in the absence of infection as per [facility]-surgery advised to hold anticoagulation for now to ensure hematoma is not expanding.  Surgery following.  Monitor H&H and transfuse further  Acute metabolic encephalopathy in the setting of dementia with worsening mental status likely multifactorial with recurrent UTI, AKI.  Patient here swelling on the left ear, is blind, has contractures.  Continue supportive care.   Chronic hypoxia needing 2 L nasal cannula at bedtime at home.  AKI, baseline creatinine 0.7 01/24/2021, and 1.5 since admission-now downtrending with IV fluid hydration.  Patient is getting blood transfusion so we will continue IV fluids.  Monitor renal function closely. Recent Labs  Lab 01/24/21 1520 01/26/21 1228 01/27/21 0254 01/28/21 0341  BUN 15 32* 38* 30*  CREATININE 0.66 1.55* 1.57* 1.23*   Chronic atrial fibrillation/history of DVT rate controll, anticoagulation on hold due to chest  wall hematoma and blood loss anemia-she will need to follow-up with PCP or with her cardiologist.  Acute blood loss anemia due to chest wall hematoma status post 1 unit PRBC transfusion 4/10, 2nd utine ordered 4/11 for h/h 6.6 gm. Monitor closely and trasnfuse Recent Labs  Lab 01/24/21 1520  01/26/21 1228 01/27/21 0254 01/28/21 0341  HGB 9.3* 7.1* 8.7* 6.6*  HCT 31.1* 24.3* 27.3* 21.0*   DM2: Hemoglobin A1c 6.6 back in February 2022, blood sugar fairly controlled continue sliding scale insulin.  Recent Labs  Lab 01/27/21 0657 01/27/21 1129 01/27/21 1625 01/27/21 2039 01/28/21 0637  GLUCAP 144* 188* 167* 193* 167*   YSA:YTKZSWF code.Will consult palliative care given her complex comorbidities, dementia AND  advanced age as prognosis appears to be guarded. I have spoken to Daughter- she did spoke to palliative care last week and 1 month f/u was pending. She agrees for palliative care evaluation while here and awaiting PC consultation.  Diet Order            Diet heart healthy/carb modified Room service appropriate? Yes; Fluid consistency: Thin  Diet effective now               Patient's Body mass index is 19.03 kg/m.  DVT prophylaxis: SCDs Start: 01/26/21 2141 Code Status:   Code Status: Partial Code  Family Communication: plan of care discussed with patient's daughter [name] over the phone.    Status is: Inpatient Remains inpatient appropriate because:Inpatient level of care appropriate due to severity of illness Dispo: The patient is from: Home              Anticipated d/c is to:TBD               Patient currently is not medically stable to d/c.   Difficult to place patient No Unresulted Labs (From admission, onward)          Start     Ordered   01/28/21 0932  Basic metabolic panel  Daily,   R     Question:  Specimen collection method  Answer:  Lab=Lab collect   01/27/21 1026   01/28/21 0500  CBC  Daily,   R     Question:  Specimen collection method  Answer:  Lab=Lab collect   01/27/21 1026   Unscheduled  Occult blood card to lab, stool  As needed,   R      01/28/21 0432        Medications reviewed:  Scheduled Meds: . sodium chloride   Intravenous Once  . atorvastatin  40 mg Oral Daily  . Chlorhexidine Gluconate Cloth  6 each Topical Daily   . donepezil  10 mg Oral QHS  . insulin aspart  0-5 Units Subcutaneous QHS  . insulin aspart  0-9 Units Subcutaneous TID WC  . melatonin  3 mg Oral QHS   Continuous Infusions: . sodium chloride 75 mL/hr at 01/28/21 0318  . meropenem (MERREM) IV 500 mg (01/27/21 2018)    Consultants:see note  Procedures:see note  Antimicrobials: Anti-infectives (From admission, onward)   Start     Dose/Rate Route Frequency Ordered Stop   01/26/21 1830  meropenem (MERREM) 500 mg in sodium chloride 0.9 % 100 mL IVPB        500 mg 200 mL/hr over 30 Minutes Intravenous Every 12 hours 01/26/21 1806       Culture/Microbiology    Component Value Date/Time   SDES URINE, RANDOM 01/26/2021 1550   SPECREQUEST NONE 01/26/2021 1550  CULT (A) 01/26/2021 1550    <10,000 COLONIES/mL INSIGNIFICANT GROWTH Performed at [facility] [address], [place], [place] [address]    REPTSTATUS 01/27/2021 FINAL 01/26/2021 1550    Other culture-see note  Objective: Vitals: Today's Vitals   01/27/21 2027 01/27/21 2037 01/28/21 0518 01/28/21 0546  BP:  (!) 102/48 (!) 107/44 (!) 112/52  Pulse:  (!) 106 72 76  Resp:  16 18 18   Temp:  99.7 F (37.6 C) 98.2 F (36.8 C) 98.7 F (37.1 C)  TempSrc:  Oral Oral Tympanic  SpO2:  100% 100%   Weight:      PainSc: 0-No pain       Intake/Output Summary (Last 24 hours) at 01/28/2021 0802 Last data filed at 01/28/2021 0600 Gross per 24 hour  Intake 534 ml  Output 2610 ml  Net -2076 ml   Filed Weights   01/26/21 1237  Weight: 50.3 kg   Weight change:   Intake/Output from previous day: 04/10 0701 - 04/11 0700 In: 771 [P.O.:771] Out: 2810 [Urine:2810] Intake/Output this shift: No intake/output data recorded. Filed Weights   01/26/21 1237  Weight: 50.3 kg    Examination: General exam: Elderly frail able to move her head, told me her name, hard of hearing.  HEENT:Oral mucosa moist, Ear/Nose WNL grossly, dentition normal. Respiratory system:  bilaterally CLEAR,no wheezing or crackles,no use of accessory muscle Cardiovascular system: S1 & S2 +, No JVD,. Gastrointestinal system: Abdomen soft, foley+ NT,ND, BS+ Nervous System:Alert, awake, demented, in fetal position Extremities: No edema, upper extremity contractures/fetal position distal peripheral pulses palpable.  Skin: No rashes,no icterus. MSK: THIN Muscle bulk,tone, power Right chest wall with form swelling small area of bruise underneath  Data Reviewed: I have personally reviewed following labs and imaging studies CBC: Recent Labs  Lab 01/24/21 1520 01/26/21 1228 01/27/21 0254 01/28/21 0341  WBC 7.6 12.6* 17.8* 10.9*  NEUTROABS 5.6 10.2*  --   --   HGB 9.3* 7.1* 8.7* 6.6*  HCT 31.1* 24.3* 27.3* 21.0*  MCV 85.7 87.4 86.1 87.1  PLT 302 398 320 235   Basic Metabolic Panel: Recent Labs  Lab 01/24/21 1520 01/26/21 1228 01/27/21 0254 01/28/21 0341  NA 138 139 135 138  K 3.4* 4.5 4.6 4.1  CL 107 106 102 107  CO2 23 26 23 27   GLUCOSE 147* 269* 193* 193*  BUN 15 32* 38* 30*  CREATININE 0.66 1.55* 1.57* 1.23*  CALCIUM 9.6 9.5 9.2 9.1   GFR: Estimated Creatinine Clearance: 24.6 mL/min (A) (by C-G formula based on SCr of 1.23 mg/dL (H)). Liver Function Tests: Recent Labs  Lab 01/24/21 1520 01/26/21 1228 01/27/21 0254  AST 21 16 15   ALT 15 11 11   ALKPHOS 47 43 45  BILITOT 0.9 0.5 1.0  PROT 7.0 6.0* 6.0*  ALBUMIN 2.8* 2.3* 2.5*   No results for input(s): LIPASE, AMYLASE in the last 168 hours. No results for input(s): AMMONIA in the last 168 hours. Coagulation Profile: Recent Labs  Lab 01/26/21 1228  INR 2.0*   Cardiac Enzymes: No results for input(s): CKTOTAL, CKMB, CKMBINDEX, TROPONINI in the last 168 hours. BNP (last 3 results) No results for input(s): PROBNP in the last 8760 hours. HbA1C: No results for input(s): HGBA1C in the last 72 hours. CBG: Recent Labs  Lab 01/27/21 0657 01/27/21 1129 01/27/21 1625 01/27/21 2039 01/28/21 0637   GLUCAP 144* 188* 167* 193* 167*   Lipid Profile: No results for input(s): CHOL, HDL, LDLCALC, TRIG, CHOLHDL, LDLDIRECT in the  last 72 hours. Thyroid Function Tests: No results for input(s): TSH, T4TOTAL, FREET4, T3FREE, THYROIDAB in the last 72 hours. Anemia Panel: No results for input(s): VITAMINB12, FOLATE, FERRITIN, TIBC, IRON, RETICCTPCT in the last 72 hours. Sepsis Labs: Recent Labs  Lab 01/26/21 1610 01/26/21 2121  LATICACIDVEN 2.4* 2.7*    Recent Results (from the past 240 hour(s))  Urine culture     Status: Abnormal   Collection Time: 01/24/21  4:18 PM   Specimen: Urine, Random  Result Value Ref Range Status   Specimen Description   Final    URINE, RANDOM Performed at [facility] [address] [place], [place] [address]    Special Requests   Final    NONE Performed at [facility], [place] [address], [place], [place] [address]    Culture (A)  Final    <10,000 COLONIES/mL INSIGNIFICANT GROWTH Performed at [facility] [address] [place], [place] [address]    Report Status 01/26/2021 FINAL  Final  Resp Panel by RT-PCR (Flu A&B, Covid) Nasopharyngeal Swab     Status: None   Collection Time: 01/24/21  4:33 PM   Specimen: Nasopharyngeal Swab; Nasopharyngeal(NP) swabs in vial transport medium  Result Value Ref Range Status   SARS Coronavirus 2 by RT PCR NEGATIVE NEGATIVE Final    Comment: (NOTE) SARS-CoV-2 target nucleic acids are NOT DETECTED.  The SARS-CoV-2 RNA is generally detectable in upper respiratory specimens during the acute phase of infection. The lowest concentration of SARS-CoV-2 viral copies this assay can detect is 138 copies/mL. A negative result does not preclude SARS-Cov-2 infection and should not be used as the sole basis for treatment or other patient management decisions. A negative result may occur with  improper specimen collection/handling, submission of specimen other than nasopharyngeal  swab, presence of viral mutation(s) within the areas targeted by this assay, and inadequate number of viral copies(<138 copies/mL). A negative result must be combined with clinical observations, patient history, and epidemiological information. The expected result is Negative.  Fact Sheet for Patients:  EntrepreneurPulse.com.au  Fact Sheet for Healthcare Providers:  IncredibleEmployment.be  This test is no t yet approved or cleared by the [place] FDA and  has been authorized for detection and/or diagnosis of SARS-CoV-2 by FDA under an Emergency Use Authorization (EUA). This EUA will remain  in effect (meaning this test can be used) for the duration of the COVID-19 declaration under Section 564(b)(1) of the Act, 21 U.S.C.section 360bbb-3(b)(1), unless the authorization is terminated  or revoked sooner.       Influenza A by PCR NEGATIVE NEGATIVE Final   Influenza B by PCR NEGATIVE NEGATIVE Final    Comment: (NOTE) The Xpert Xpress SARS-CoV-2/FLU/RSV plus assay is intended as an aid in the diagnosis of influenza from Nasopharyngeal swab specimens and should not be used as a sole basis for treatment. Nasal washings and aspirates are unacceptable for Xpert Xpress SARS-CoV-2/FLU/RSV testing.  Fact Sheet for Patients: EntrepreneurPulse.com.au  Fact Sheet for Healthcare Providers: IncredibleEmployment.be  This test is not yet approved or cleared by the [place] FDA and has been authorized for detection and/or diagnosis of SARS-CoV-2 by FDA under an Emergency Use Authorization (EUA). This EUA will remain in effect (meaning this test can be used) for the duration of the COVID-19 declaration under Section 564(b)(1) of the Act, 21 U.S.C. section 360bbb-3(b)(1), unless the authorization is terminated or revoked.  Performed at [facility], [place] [address], [place],  [address]  Blood culture (routine x 2)     Status: None (Preliminary result)   Collection Time: 01/26/21 12:44 PM   Specimen: BLOOD  Result Value Ref Range Status   Specimen Description BLOOD SITE NOT SPECIFIED  Final   Special Requests   Final    BOTTLES DRAWN AEROBIC AND ANAEROBIC Blood Culture results may not be optimal due to an inadequate volume of blood received in culture bottles   Culture   Final    NO GROWTH < 24 HOURS Performed at [facility], [address], [place], [place] [address]    Report Status PENDING  Incomplete  Resp Panel by RT-PCR (Flu A&B, Covid) Nasopharyngeal Swab     Status: None   Collection Time: 01/26/21  1:00 PM   Specimen: Nasopharyngeal Swab; Nasopharyngeal(NP) swabs in vial transport medium  Result Value Ref Range Status   SARS Coronavirus 2 by RT PCR NEGATIVE NEGATIVE Final    Comment: (NOTE) SARS-CoV-2 target nucleic acids are NOT DETECTED.  The SARS-CoV-2 RNA is generally detectable in upper respiratory specimens during the acute phase of infection. The lowest concentration of SARS-CoV-2 viral copies this assay can detect is 138 copies/mL. A negative result does not preclude SARS-Cov-2 infection and should not be used as the sole basis for treatment or other patient management decisions. A negative result may occur with  improper specimen collection/handling, submission of specimen other than nasopharyngeal swab, presence of viral mutation(s) within the areas targeted by this assay, and inadequate number of viral copies(<138 copies/mL). A negative result must be combined with clinical observations, patient history, and epidemiological information. The expected result is Negative.  Fact Sheet for Patients:  EntrepreneurPulse.com.au  Fact Sheet for Healthcare Providers:  IncredibleEmployment.be  This test is no t yet approved or cleared by the [place] FDA and  has been authorized for detection and/or  diagnosis of SARS-CoV-2 by FDA under an Emergency Use Authorization (EUA). This EUA will remain  in effect (meaning this test can be used) for the duration of the COVID-19 declaration under Section 564(b)(1) of the Act, 21 U.S.C.section 360bbb-3(b)(1), unless the authorization is terminated  or revoked sooner.       Influenza A by PCR NEGATIVE NEGATIVE Final   Influenza B by PCR NEGATIVE NEGATIVE Final    Comment: (NOTE) The Xpert Xpress SARS-CoV-2/FLU/RSV plus assay is intended as an aid in the diagnosis of influenza from Nasopharyngeal swab specimens and should not be used as a sole basis for treatment. Nasal washings and aspirates are unacceptable for Xpert Xpress SARS-CoV-2/FLU/RSV testing.  Fact Sheet for Patients: EntrepreneurPulse.com.au  Fact Sheet for Healthcare Providers: IncredibleEmployment.be  This test is not yet approved or cleared by the [place] FDA and has been authorized for detection and/or diagnosis of SARS-CoV-2 by FDA under an Emergency Use Authorization (EUA). This EUA will remain in effect (meaning this test can be used) for the duration of the COVID-19 declaration under Section 564(b)(1) of the Act, 21 U.S.C. section 360bbb-3(b)(1), unless the authorization is terminated or revoked.  Performed at [facility], [place] [address], [place],  [address]   Blood culture (routine x 2)     Status: None (Preliminary result)   Collection Time: 01/26/21  1:30 PM   Specimen: BLOOD  Result Value Ref Range Status   Specimen Description BLOOD SITE NOT SPECIFIED  Final   Special Requests   Final    BOTTLES DRAWN AEROBIC AND ANAEROBIC Blood Culture results may not be optimal due to  an inadequate volume of blood received in culture bottles   Culture   Final    NO GROWTH < 24 HOURS Performed at [facility] [address] [place], [place] [address]    Report Status PENDING  Incomplete  Urine Culture      Status: Abnormal   Collection Time: 01/26/21  3:50 PM   Specimen: Urine, Random  Result Value Ref Range Status   Specimen Description URINE, RANDOM  Final   Special Requests NONE  Final   Culture (A)  Final    <10,000 COLONIES/mL INSIGNIFICANT GROWTH Performed at [facility], [place] [address], [place], [place] [address]    Report Status 01/27/2021 FINAL  Final     Radiology Studies: CT Head Wo Contrast  Result Date: 01/26/2021 CLINICAL DATA:  Altered mental status EXAM: CT HEAD WITHOUT CONTRAST TECHNIQUE: Contiguous axial images were obtained from the base of the skull through the vertex without intravenous contrast. COMPARISON:  January 04, 2021 head CT; December 16, 2020 brain MRI FINDINGS: Brain: Diffuse atrophy is stable. There is no intracranial mass hemorrhage, extra-axial fluid collection, or midline shift. There is decreased attenuation throughout the centra semiovale bilaterally. Decreased attenuation is also noted in portions of the internal and external capsules, stable. These changes are consistent with supratentorial small vessel disease. Small vessel disease also noted in the thalami. No acute infarct is evident on this study. Vascular: There is no appreciable hyperdense vessel. There is calcification in each carotid siphon and distal left vertebral artery region. Skull: The bony calvarium appears intact. Sinuses/Orbits: Patient has undergone previous antrostomy on the left. There is mucosal thickening in several ethmoid air cells. Orbits appear symmetric bilaterally. Other: Mastoid air cells on the left are clear. Postoperative changes involving the mastoids on the right noted with remaining mastoids on the right clear. 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 No acute infarct evident. No mass or hemorrhage. Foci of arterial vascular calcification noted. Previous mastoidectomy on the right. Remaining mastoids are clear. Mucosal thickening noted in  several ethmoid air cells. Electronically Signed   By: Lowella Grip III M.D.   On: 01/26/2021 14:18   CT CHEST WO CONTRAST  Result Date: 01/26/2021 CLINICAL DATA:  Chest trauma, large swelling mass of the anterior right chest EXAM: CT CHEST WITHOUT CONTRAST TECHNIQUE: Multidetector CT imaging of the chest was performed following the standard protocol without IV contrast. COMPARISON:  01/24/2021 FINDINGS: Cardiovascular: Aortic atherosclerosis. Dense aortic valve calcifications. Cardiomegaly. Dense mitral annulus calcifications. Three-vessel coronary artery calcifications and/or stents. Small pericardial effusion. Mediastinum/Nodes: No enlarged mediastinal, hilar, or axillary lymph nodes. Thyroid gland, trachea, and esophagus demonstrate no significant findings. Lungs/Pleura: Lungs are clear. Small right, trace left pleural effusions, similar to prior examination. Upper Abdomen: No acute abnormality. Musculoskeletal: Large hematoma within the right pectoralis major muscle body measuring 8.3 x 5.4 cm (series 3, image 23). Soft tissue contusion of the right chest wall. No chest wall mass or suspicious bone lesions identified. IMPRESSION: 1. Large hematoma within the right pectoralis major muscle body measuring 8.3 x 5.4 cm. Soft tissue contusion of the right chest wall. 2. Small right, trace left pleural effusions, similar to prior examination. 3. Cardiomegaly and small pericardial effusion, unchanged compared to prior examination. 4. Coronary artery disease. 5. Dense aortic valve calcifications. Dense mitral annulus calcifications. For correlate for echocardiographic evidence of valve dysfunction. Aortic Atherosclerosis (ICD10-I70.0). Electronically Signed   By: Eddie Candle M.D.   On: 01/26/2021 16:07  DG Chest Port 1 View  Result Date: 01/26/2021 CLINICAL DATA:  Decreased oxygen saturation.  Dyspnea. EXAM: PORTABLE CHEST 1 VIEW COMPARISON:  January 24, 2021 chest radiograph and chest CT FINDINGS: There is  no edema or airspace opacity. There is cardiomegaly with pulmonary vascularity normal. No adenopathy. There is mitral annulus calcification as well as calcification in the aorta and in each carotid artery. Degenerative change noted in each shoulder. IMPRESSION: No edema or airspace opacity. Cardiomegaly. There is mitral annulus calcification. There also foci of carotid artery calcification and aortic atherosclerosis. Aortic Atherosclerosis (ICD10-I70.0). Electronically Signed   By: Lowella Grip III M.D.   On: 01/26/2021 14:20     LOS: 2 days   Antonieta Pert, MD [facility]  01/28/2021, 8:02 AM

## 2021-01-28 NOTE — Progress Notes (Signed)
Notified by RN that Hgb 6.6 this am. Has chest hematoma. RN reports is not expanding and is stable.  Pt is hemodynamically stable.  Check Hemoccult of stools Transfuse one unit PRBC

## 2021-01-29 DIAGNOSIS — G9341 Metabolic encephalopathy: Secondary | ICD-10-CM | POA: Diagnosis not present

## 2021-01-29 DIAGNOSIS — R627 Adult failure to thrive: Secondary | ICD-10-CM | POA: Diagnosis not present

## 2021-01-29 DIAGNOSIS — N3001 Acute cystitis with hematuria: Secondary | ICD-10-CM | POA: Diagnosis not present

## 2021-01-29 DIAGNOSIS — Z515 Encounter for palliative care: Secondary | ICD-10-CM

## 2021-01-29 DIAGNOSIS — E43 Unspecified severe protein-calorie malnutrition: Secondary | ICD-10-CM | POA: Diagnosis not present

## 2021-01-29 LAB — BPAM RBC
Blood Product Expiration Date: 202204232359
Blood Product Expiration Date: 202204282359
ISSUE DATE / TIME: 202204091930
ISSUE DATE / TIME: 202204110528
Unit Type and Rh: 9500
Unit Type and Rh: 9500

## 2021-01-29 LAB — TYPE AND SCREEN
ABO/RH(D): O NEG
Antibody Screen: NEGATIVE
Unit division: 0
Unit division: 0

## 2021-01-29 LAB — CBC
HCT: 25.4 % — ABNORMAL LOW (ref 36.0–46.0)
Hemoglobin: 8.2 g/dL — ABNORMAL LOW (ref 12.0–15.0)
MCH: 27.9 pg (ref 26.0–34.0)
MCHC: 32.3 g/dL (ref 30.0–36.0)
MCV: 86.4 fL (ref 80.0–100.0)
Platelets: 271 10*3/uL (ref 150–400)
RBC: 2.94 MIL/uL — ABNORMAL LOW (ref 3.87–5.11)
RDW: 15.9 % — ABNORMAL HIGH (ref 11.5–15.5)
WBC: 8.5 10*3/uL (ref 4.0–10.5)
nRBC: 0.2 % (ref 0.0–0.2)

## 2021-01-29 LAB — BASIC METABOLIC PANEL
Anion gap: 5 (ref 5–15)
BUN: 19 mg/dL (ref 8–23)
CO2: 23 mmol/L (ref 22–32)
Calcium: 8.9 mg/dL (ref 8.9–10.3)
Chloride: 107 mmol/L (ref 98–111)
Creatinine, Ser: 0.7 mg/dL (ref 0.44–1.00)
GFR, Estimated: >60 mL/min (ref >60)
Glucose, Bld: 134 mg/dL — ABNORMAL HIGH (ref 70–99)
Potassium: 3.7 mmol/L (ref 3.5–5.1)
Sodium: 135 mmol/L (ref 135–145)

## 2021-01-29 LAB — GLUCOSE, CAPILLARY
Glucose-Capillary: 117 mg/dL — ABNORMAL HIGH (ref 70–99)
Glucose-Capillary: 182 mg/dL — ABNORMAL HIGH (ref 70–99)
Glucose-Capillary: 144 mg/dL — ABNORMAL HIGH (ref 70–99)
Glucose-Capillary: 94 mg/dL (ref 70–99)

## 2021-01-29 NOTE — Evaluation (Signed)
Physical Therapy Evaluation Patient Details Name: Rebecca Pruitt MRN: [id number] DOB: 1929-09-04 Today's Date: 01/29/2021   History of Present Illness  Rebecca Pruitt is an 87 y/o female admitted on 01/26/21 due to UTI. CT found right chest hematoma. Pt recently d/c from [facility] on 01/27/21. PMH includes history of recurrent UTIs, CVA, HTN, CAD, dementia, chronic A-fib, history of DVTs, and HLD.    Clinical Impression  Pt received in bed with daughter present. Able to respond appropriately to questions with increased time. Total Ax2 for all mobility. Pt reporting increased pain in B LEs with all movement. Unable to tolerate sitting for more than 30 seconds. Daughter would like pt to continue with PT as she was previously walking and completing transfers to wheelchair (although timeline is unclear with many recent admission to hospital and short term rehab). Pt would benefit from PT to improve functional mobility, decrease risk for falls, and increase independency. Will continue to follow acutely.    Follow Up Recommendations Home health PT;Supervision/Assistance - 24 hour;Other (comment) (Daughter would not like for pt to return to SNF. 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 and almost deaf in the other (hears better in left ear), dementia at baseline, right chest hematoma, right leg hematoma Restrictions Weight Bearing Restrictions: No      Mobility  Bed Mobility Overal bed mobility: Needs Assistance Bed Mobility: Supine to Sit;Sit to Supine Rolling: Total assist   Supine to sit: Total assist;+2 for physical assistance Sit to supine: Total assist;+2 for physical assistance   General bed mobility comments: Rolling for pericare    Transfers                  General transfer comment: unable  Ambulation/Gait             General Gait Details: unable  Stairs            Wheelchair Mobility    Modified Rankin (Stroke Patients Only)       Balance Overall balance assessment: Needs assistance Sitting-balance support: Feet supported;Bilateral upper extremity supported Sitting balance-Leahy Scale: Zero Sitting balance - Comments: Heavy posterior lean in sitting Postural control: Posterior lean                                   Pertinent Vitals/Pain Pain Assessment: No/denies pain Pain Intervention(s): Monitored during session    Home Living Family/patient expects to be discharged to:: Private residence Living Arrangements: Children Available Help at Discharge: Family;Available 24 hours/day Type of Home: House         Home Equipment: Walker - 2 wheels;Cane - single point;Bedside commode;Shower seat;Wheelchair - manual;Hospital bed;Other (comment) (wheelchair for shower) Additional Comments: Pt lives with daughter and son-in-law, who provide assistance. Per daughter, they are well equipped. Pt has had series of recent hospitalizations. She was able to walk with RW and assistance (timeline unclear, but sounds like at some point inbetween hospitalizations). Daughter assists with navigating RW since pt is blind. When pt unable to walk, daughter and son-in-law help with stand pivot transfers to wheelchair. Pt needs full assistance for bathing and dressing. Daughter has been wheeling pt into shower in wheelchair appropriate for shower    Prior Function  Level of Independence: Needs assistance               Hand Dominance        Extremity/Trunk Assessment   Upper Extremity Assessment Upper Extremity Assessment: Defer to OT evaluation    Lower Extremity Assessment Lower Extremity Assessment: Generalized weakness (Able to lift legs off bed when asked to, but no MMTs formally tested)       Communication    Communication: HOH  Cognition Arousal/Alertness: Awake/alert Behavior During Therapy: Flat affect Overall Cognitive Status: History of cognitive impairments - at baseline Area of Impairment: Awareness;Problem solving                       Following Commands: Follows one step commands inconsistently;Follows one step commands with increased time   Awareness: Emergent Problem Solving: Slow processing;Decreased initiation;Requires verbal cues;Requires tactile cues General Comments: Pt responding to questions with increased time and occassional tactile stimulation. Answering questions appropriate. Needed increased verbal and tactile cueing to stay on task      General Comments      Exercises     Assessment/Plan    PT Assessment Patient needs continued PT services  PT Problem List Decreased strength;Decreased mobility;Decreased activity tolerance;Decreased balance;Decreased cognition;Decreased safety awareness       PT Treatment Interventions DME instruction;Therapeutic activities;Gait training;Therapeutic exercise;Functional mobility training;Balance training;Patient/family education;Wheelchair mobility training;Neuromuscular re-education    PT Goals (Current goals can be found in the Care Plan section)  Acute Rehab PT Goals Patient Stated Goal: get stronger, walk again PT Goal Formulation: With family Time For Goal Achievement: 02/12/21 Potential to Achieve Goals: Fair    Frequency Min 3X/week   Barriers to discharge        Co-evaluation               AM-PAC PT "6 Clicks" Mobility  Outcome Measure Help needed turning from your back to your side while in a flat bed without using bedrails?: Total Help needed moving from lying on your back to sitting on the side of a flat bed without using bedrails?: Total Help needed moving to and from a bed to a chair (including a wheelchair)?: Total Help needed standing up from a chair using your arms (e.g., wheelchair  or bedside chair)?: Total Help needed to walk in hospital room?: Total Help needed climbing 3-5 steps with a railing? : Total 6 Click Score: 6    End of Session   Activity Tolerance: Patient limited by pain Patient left: in bed;with call bell/phone within reach;with bed alarm set Nurse Communication: Mobility status PT Visit Diagnosis: Muscle weakness (generalized) (M62.81);Other abnormalities of gait and mobility (R26.89)    Time:  -      Charges:         Rosita Kea, SPT

## 2021-01-29 NOTE — Progress Notes (Signed)
Orthopedic Tech Progress Note Patient Details:  [name] 1929/01/14 [id number] Spoke with RN about patient needing a "hand brace", was ordered on the nursing part but not ortho. Reaching out to OT and PT to see who is doing this brace for patient and or which brace she may need Patient ID: Rebecca Pruitt, female   DOB: 1929-03-26, 87 y.o.   MRN: [id number]   Janit Pagan 01/29/2021, 4:04 PM

## 2021-01-29 NOTE — Progress Notes (Addendum)
PROGRESS NOTE    [name]  NID:[id number] DOB: 11-Feb-1929 DOA: 01/26/2021 PCP: Vicenta Aly, FNP   Chief Complaint  Patient presents with   Altered Mental Status  Brief Narrative: 87 year old female is seen within medical history of dementia, diabetes, coronary artery disease, hypertension, CVA on Eliquis brought due to weakness and right chest wall swelling as well as  altered mental status. On admission patient daughter reported multiple urinary tract infection lately, history of DVT. EMS was called she was hypoxic to 70% per EMS.  In the ED patient was lethargic. Blood pressure was soft 86/66, with leukocytosis abnormal UA, lactic acidosis elevated creatinine patient was admitted.  Her hemoglobin had dropped to 7.1 g and was transfused 1 units on admission. Surgery was consulted for hematoma in the chest wall.  Patient placed on meropenem and admitted for UTI and severe sepsis. H&H dropped further assessment 6.6 g on 4/11 and additional 1 more unit given.  Subjective: Seen this morning.  Patient appears much more alert awake she was able to tell me her name. She did not know where she was. 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  Assessment & Plan:  Suspected Severe Sepsis POA 2/2 recurrent UTI w/ leukocytosis, lactic acidosis, hypotension: Treated with meropenem due to history of ESBL E. Coli-urine culture less than 10,000 colonies antibiotic changed to ceftriaxone-continue 5 days total treatment therapy.Leukocytosis has resolved, is afebrile blood pressure stable. UA on admission wbc >50, rbc >50, yeast +, non sp epith +, nitrite + and LE large.Blood culture no growth so far, She was previously seen by infectious disease-last office visit 2/28 by Dr. Juleen China. She is also followed by urology as OP.  Foley catheter discontinued 4/11 monitor  voiding/bladder scan.   Recent Labs  Lab 01/24/21 1520 01/26/21 1228 01/26/21 1610 01/26/21 2121 01/27/21 0254 01/28/21 0341 01/29/21 0611  WBC 7.6 12.6*  --   --  17.8* 10.9* 8.5  LATICACIDVEN  --   --  2.4* 2.7*  --   --   --    Chest wall hematoma: In the setting of anticoagulation, 2/2 ?trauma. CT shows a large mass in the right pectoralis muscle well-circumscribed consistent with hematoma likely from local trauma.  Surgery following and no need for drainage at this time and will continue to hold Eliquis.  Surgery signed off. H&H has dropped needing 2 units PRBC so far.  If H&H continues to drop will needs to d/w surgery.  Acute metabolic encephalopathy in the setting of dementia with worsening mental status likely multifactorial with recurrent UTI, AKI.  Much more alert awake.  Patient able to hear only on left ear, is blind, has  UE contractures.  Continue supportive care.   Acute on chronic hypoxic resp failure, needing NRBon admission, needing 2 L nasal cannula at bedtime at home. Stable now  AKI, baseline creatinine 0.7 01/24/2021, and 1.5 since admission-creatinine improved to 0.7 we will discontinue IV fluids.  At risk of further renal failure due to her poor intake. Recent Labs  Lab 01/24/21 1520 01/26/21 1228 01/27/21 0254 01/28/21 0341 01/29/21 0611  BUN 15 32* 38* 30* 19  CREATININE 0.66 1.55* 1.57* 1.23* 0.70   Chronic atrial fibrillation/history of DVT rate controll, anticoagulation on hold due to chest wall hematoma and blood loss anemia-she will need to follow-up with PCP or with her cardiologist.  Acute  blood loss anemia due to chest wall hematoma status post 2 units PRBC so far.  H&H remained stable.  Monitor  Recent Labs  Lab 01/26/21 1228 01/27/21 0254 01/28/21 0341 01/28/21 1109 01/29/21 0611  HGB 7.1* 8.7* 6.6* 8.4* 8.2*  HCT 24.3* 27.3* 21.0* 25.8* 25.4*   DM2: Hemoglobin A1c 6.6 back in February 2022, blood sugar well controlled on sliding scale  insulin.  Recent Labs  Lab 01/28/21 0637 01/28/21 1132 01/28/21 1650 01/28/21 2054 01/29/21 0743  GLUCAP 167* 152* 126* 155* 117*    Deconditioning/contractures PT OT evaluation may benefit with brace.  Supportive care.  YTK:PTWSFKC code.palliative care has been consulted given her complex comorbidities, dementia AND  advanced age as prognosis appears to be guarded. I have spoken to Daughter- she did speake to palliative care last week  As OP and 1 month f/u was pending. Awaiting for palliative care consultation today.  Diet Order             Diet heart healthy/carb modified Room service appropriate? Yes; Fluid consistency: Thin  Diet effective now                 Patient's Body mass index is 19.03 kg/m.  DVT prophylaxis: SCDs Start: 01/26/21 2141 Code Status:   Code Status: Partial Code  Family Communication: plan of care discussed with patient's daughter [name] over the phone.    Status is: Inpatient Remains inpatient appropriate because:Inpatient level of care appropriate due to severity of illness Dispo: The patient is from: Home              Anticipated d/c is to:TBD ptot EVAL, palliative care evaluation.              Patient currently is not medically stable to d/c.   Difficult to place patient No Unresulted Labs (From admission, onward)            Start     Ordered   01/28/21 1275  Basic metabolic panel  Daily,   R     Question:  Specimen collection method  Answer:  Lab=Lab collect   01/27/21 1026   01/28/21 0500  CBC  Daily,   R     Question:  Specimen collection method  Answer:  Lab=Lab collect   01/27/21 1026   Unscheduled  Occult blood card to lab, stool  As needed,   R      01/28/21 0432          Medications reviewed:  Scheduled Meds:  sodium chloride   Intravenous Once   atorvastatin  40 mg Oral Daily   Chlorhexidine Gluconate Cloth  6 each Topical Daily   donepezil  10 mg Oral QHS   insulin aspart  0-5 Units Subcutaneous QHS   insulin  aspart  0-9 Units Subcutaneous TID WC   melatonin  3 mg Oral QHS   Continuous Infusions:  sodium chloride 30 mL/hr at 01/28/21 1448   cefTRIAXone (ROCEPHIN)  IV 1 g (01/28/21 1453)    Consultants:see note  Procedures:see note  Antimicrobials: Anti-infectives (From admission, onward)    Start     Dose/Rate Route Frequency Ordered Stop   01/28/21 1445  cefTRIAXone (ROCEPHIN) 1 g in sodium chloride 0.9 % 100 mL IVPB        1 g 200 mL/hr over 30 Minutes Intravenous Every 24 hours 01/28/21 1352 01/31/21 0959   01/26/21 1830  meropenem (MERREM) 500 mg in sodium chloride 0.9 % 100 mL IVPB  Status:  Discontinued        500 mg 200 mL/hr over 30 Minutes Intravenous Every 12 hours 01/26/21 1806 01/28/21 1352      Culture/Microbiology    Component Value Date/Time   SDES URINE, RANDOM 01/26/2021 1550   SPECREQUEST NONE 01/26/2021 1550   CULT (A) 01/26/2021 1550    <10,000 COLONIES/mL INSIGNIFICANT GROWTH Performed at [facility] [address], [place], [place] [address]    REPTSTATUS 01/27/2021 FINAL 01/26/2021 1550    Other culture-see note  Objective: Vitals: Today's Vitals   01/28/21 1144 01/28/21 1657 01/28/21 2056 01/29/21 0508  BP: 114/71 (!) 113/53 (!) 119/57 110/61  Pulse: (!) 48 (!) 52 74 74  Resp: 18 15 15 16   Temp: 98.7 F (37.1 C) 98.6 F (37 C) 98.9 F (37.2 C) 99.6 F (37.6 C)  TempSrc: Oral Oral Oral Oral  SpO2: 100% 97% 100% 97%  Weight:      PainSc:   0-No pain     Intake/Output Summary (Last 24 hours) at 01/29/2021 0804 Last data filed at 01/29/2021 0447 Gross per 24 hour  Intake 3558.75 ml  Output 1500 ml  Net 2058.75 ml   Filed Weights   01/26/21 1237  Weight: 50.3 kg   Weight change:   Intake/Output from previous day: 04/11 0701 - 04/12 0700 In: 3558.8 [P.O.:640; I.V.:1953.8; Blood:315; IV Piggyback:650] Out: 1500 [Urine:1500] Intake/Output this shift: No intake/output data recorded. Filed Weights   01/26/21 1237  Weight: 50.3  kg    Examination: General exam: Alert awake oriented to self, elderly debilitated frail ill looking  HEENT:Oral mucosa moist, Ear/Nose WNL grossly, dentition normal. Respiratory system: bilaterally diminishedd,no wheezing or crackles,no use of accessory muscle Cardiovascular system: S1 & S2 +, murmur present, no JVD,. Gastrointestinal system: Abdomen soft, NT,ND, BS+ Nervous System:Alert, awake, upper extremity contractures present.   Extremities: No edema, distal peripheral pulses palpable.  Skin: No rashes,no icterus. MSK: Normal muscle bulk,tone, power Right upper chest wall with firm swelling slightly tender  Data Reviewed: I have personally reviewed following labs and imaging studies CBC: Recent Labs  Lab 01/24/21 1520 01/26/21 1228 01/27/21 0254 01/28/21 0341 01/28/21 1109 01/29/21 0611  WBC 7.6 12.6* 17.8* 10.9*  --  8.5  NEUTROABS 5.6 10.2*  --   --   --   --   HGB 9.3* 7.1* 8.7* 6.6* 8.4* 8.2*  HCT 31.1* 24.3* 27.3* 21.0* 25.8* 25.4*  MCV 85.7 87.4 86.1 87.1  --  86.4  PLT 302 398 320 262  --  814   Basic Metabolic Panel: Recent Labs  Lab 01/24/21 1520 01/26/21 1228 01/27/21 0254 01/28/21 0341 01/29/21 0611  NA 138 139 135 138 135  K 3.4* 4.5 4.6 4.1 3.7  CL 107 106 102 107 107  CO2 23 26 23 27 23   GLUCOSE 147* 269* 193* 193* 134*  BUN 15 32* 38* 30* 19  CREATININE 0.66 1.55* 1.57* 1.23* 0.70  CALCIUM 9.6 9.5 9.2 9.1 8.9   GFR: Estimated Creatinine Clearance: 37.9 mL/min (by C-G formula based on SCr of 0.7 mg/dL). Liver Function Tests: Recent Labs  Lab 01/24/21 1520 01/26/21 1228 01/27/21 0254  AST 21 16 15   ALT 15 11 11   ALKPHOS 47 43 45  BILITOT 0.9 0.5 1.0  PROT 7.0 6.0* 6.0*  ALBUMIN 2.8* 2.3* 2.5*   No results for input(s): LIPASE, AMYLASE in the last 168 hours. No results for input(s): AMMONIA in the last 168 hours. Coagulation Profile: Recent Labs  Lab 01/26/21 1228  INR 2.0*   Cardiac Enzymes: No results for input(s): CKTOTAL,  CKMB, CKMBINDEX, TROPONINI in the last 168 hours. BNP (last 3 results) No results for input(s): PROBNP in the last 8760 hours. HbA1C: No results for input(s): HGBA1C in the last 72 hours. CBG: Recent Labs  Lab 01/28/21 0637 01/28/21 1132 01/28/21 1650 01/28/21 2054 01/29/21 0743  GLUCAP 167* 152* 126* 155* 117*   Lipid Profile: No results for input(s): CHOL, HDL, LDLCALC, TRIG, CHOLHDL, LDLDIRECT in the last 72 hours. Thyroid Function Tests: No results for input(s): TSH, T4TOTAL, FREET4, T3FREE, THYROIDAB in the last 72 hours. Anemia Panel: No results for input(s): VITAMINB12, FOLATE, FERRITIN, TIBC, IRON, RETICCTPCT in the last 72 hours. Sepsis Labs: Recent Labs  Lab 01/26/21 1610 01/26/21 2121  LATICACIDVEN 2.4* 2.7*    Recent Results (from the past 240 hour(s))  Urine culture     Status: Abnormal   Collection Time: 01/24/21  4:18 PM   Specimen: Urine, Random  Result Value Ref Range Status   Specimen Description   Final    URINE, RANDOM Performed at [facility] [address], [place], [place] [address]    Special Requests   Final    NONE Performed at [facility], [place] [address], [place], [place] [address]    Culture (A)  Final    <10,000 COLONIES/mL INSIGNIFICANT GROWTH Performed at [facility] [address] [place], [place] [address]    Report Status 01/26/2021 FINAL  Final  Resp Panel by RT-PCR (Flu A&B, Covid) Nasopharyngeal Swab     Status: None   Collection Time: 01/24/21  4:33 PM   Specimen: Nasopharyngeal Swab; Nasopharyngeal(NP) swabs in vial transport medium  Result Value Ref Range Status   SARS Coronavirus 2 by RT PCR NEGATIVE NEGATIVE Final    Comment: (NOTE) SARS-CoV-2 target nucleic acids are NOT DETECTED.  The SARS-CoV-2 RNA is generally detectable in upper respiratory specimens during the acute phase of infection. The lowest concentration of SARS-CoV-2 viral copies this assay can detect  is 138 copies/mL. A negative result does not preclude SARS-Cov-2 infection and should not be used as the sole basis for treatment or other patient management decisions. A negative result may occur with  improper specimen collection/handling, submission of specimen other than nasopharyngeal swab, presence of viral mutation(s) within the areas targeted by this assay, and inadequate number of viral copies(<138 copies/mL). A negative result must be combined with clinical observations, patient history, and epidemiological information. The expected result is Negative.  Fact Sheet for Patients:  EntrepreneurPulse.com.au  Fact Sheet for Healthcare Providers:  IncredibleEmployment.be  This test is no t yet approved or cleared by the [place] FDA and  has been authorized for detection and/or diagnosis of SARS-CoV-2 by FDA under an Emergency Use Authorization (EUA). This EUA will remain  in effect (meaning this test can be used) for the duration of the COVID-19 declaration under Section 564(b)(1) of the Act, 21 U.S.C.section 360bbb-3(b)(1), unless the authorization is terminated  or revoked sooner.       Influenza A by PCR NEGATIVE NEGATIVE Final   Influenza B by PCR NEGATIVE NEGATIVE Final    Comment: (NOTE) The Xpert Xpress SARS-CoV-2/FLU/RSV plus assay is intended as an aid in the diagnosis of influenza from Nasopharyngeal swab specimens and should not be used as a sole basis for treatment. Nasal washings and aspirates are unacceptable for Xpert Xpress SARS-CoV-2/FLU/RSV testing.  Fact Sheet for Patients: EntrepreneurPulse.com.au  Fact Sheet for Healthcare Providers:  IncredibleEmployment.be  This test is not yet approved or cleared by the [name] and has been authorized for detection and/or diagnosis of SARS-CoV-2 by FDA under an Emergency Use Authorization (EUA). This EUA will remain in effect  (meaning this test can be used) for the duration of the COVID-19 declaration under Section 564(b)(1) of the Act, 21 U.S.C. section 360bbb-3(b)(1), unless the authorization is terminated or revoked.  Performed at [facility], [place] [address], [place], [place] [address]   Blood culture (routine x 2)     Status: None (Preliminary result)   Collection Time: 01/26/21 12:44 PM   Specimen: BLOOD  Result Value Ref Range Status   Specimen Description BLOOD SITE NOT SPECIFIED  Final   Special Requests   Final    BOTTLES DRAWN AEROBIC AND ANAEROBIC Blood Culture results may not be optimal due to an inadequate volume of blood received in culture bottles   Culture   Final    NO GROWTH 2 DAYS Performed at [facility], [place] [address], [place], [place] [address]    Report Status PENDING  Incomplete  Resp Panel by RT-PCR (Flu A&B, Covid) Nasopharyngeal Swab     Status: None   Collection Time: 01/26/21  1:00 PM   Specimen: Nasopharyngeal Swab; Nasopharyngeal(NP) swabs in vial transport medium  Result Value Ref Range Status   SARS Coronavirus 2 by RT PCR NEGATIVE NEGATIVE Final    Comment: (NOTE) SARS-CoV-2 target nucleic acids are NOT DETECTED.  The SARS-CoV-2 RNA is generally detectable in upper respiratory specimens during the acute phase of infection. The lowest concentration of SARS-CoV-2 viral copies this assay can detect is 138 copies/mL. A negative result does not preclude SARS-Cov-2 infection and should not be used as the sole basis for treatment or other patient management decisions. A negative result may occur with  improper specimen collection/handling, submission of specimen other than nasopharyngeal swab, presence of viral mutation(s) within the areas targeted by this assay, and inadequate number of viral copies(<138 copies/mL). A negative result must be combined with clinical observations, patient history, and epidemiological information. The expected  result is Negative.  Fact Sheet for Patients:  EntrepreneurPulse.com.au  Fact Sheet for Healthcare Providers:  IncredibleEmployment.be  This test is no t yet approved or cleared by the [place] FDA and  has been authorized for detection and/or diagnosis of SARS-CoV-2 by FDA under an Emergency Use Authorization (EUA). This EUA will remain  in effect (meaning this test can be used) for the duration of the COVID-19 declaration under Section 564(b)(1) of the Act, 21 U.S.C.section 360bbb-3(b)(1), unless the authorization is terminated  or revoked sooner.       Influenza A by PCR NEGATIVE NEGATIVE Final   Influenza B by PCR NEGATIVE NEGATIVE Final    Comment: (NOTE) The Xpert Xpress SARS-CoV-2/FLU/RSV plus assay is intended as an aid in the diagnosis of influenza from Nasopharyngeal swab specimens and should not be used as a sole basis for treatment. Nasal washings and aspirates are unacceptable for Xpert Xpress SARS-CoV-2/FLU/RSV testing.  Fact Sheet for Patients: EntrepreneurPulse.com.au  Fact Sheet for Healthcare Providers: IncredibleEmployment.be  This test is not yet approved or cleared by the [place] FDA and has been authorized for detection and/or diagnosis of SARS-CoV-2 by FDA under an Emergency Use Authorization (EUA). This EUA will remain in effect (meaning this test can be used) for the duration of the COVID-19 declaration under Section 564(b)(1) of the Act, 21 U.S.C. section 360bbb-3(b)(1), unless the authorization  is terminated or revoked.  Performed at [facility], [place] [address] [place], [place] [address]   Blood culture (routine x 2)     Status: None (Preliminary result)   Collection Time: 01/26/21  1:30 PM   Specimen: BLOOD  Result Value Ref Range Status   Specimen Description BLOOD SITE NOT SPECIFIED  Final   Special Requests   Final    BOTTLES DRAWN AEROBIC AND  ANAEROBIC Blood Culture results may not be optimal due to an inadequate volume of blood received in culture bottles   Culture   Final    NO GROWTH 2 DAYS Performed at [facility], [place] [address] [place], [place] [address]    Report Status PENDING  Incomplete  Urine Culture     Status: Abnormal   Collection Time: 01/26/21  3:50 PM   Specimen: Urine, Random  Result Value Ref Range Status   Specimen Description URINE, RANDOM  Final   Special Requests NONE  Final   Culture (A)  Final    <10,000 COLONIES/mL INSIGNIFICANT GROWTH Performed at [facility], [place] [address] [place], [place] [address]    Report Status 01/27/2021 FINAL  Final     Radiology Studies: No results found.   LOS: 3 days   Antonieta Pert, MD [facility]  01/29/2021, 8:04 AM

## 2021-01-29 NOTE — Consult Note (Addendum)
Consultation Note Date: 01/29/21  Patient Name: Rebecca Pruitt  DOB: [birth year]  MRN: [id number]  Age / Sex: 87 y.o., female  PCP: Rebecca Pruitt, [place] Referring Physician: Antonieta Pert, MD  Reason for Consultation: Establishing goals of care and Psychosocial/spiritual support  HPI/Patient Profile: 87 y.o. female  admitted on 01/26/2021 with medical history of dementia, diabetes, coronary artery disease, hypertension, CVA on Eliquis brought due to weakness and right chest wall swelling as well as  altered mental status.  On admission patient daughter reported multiple urinary tract infection.  She is a patient of Rebecca Pruitt/Urology and had a recent cystoscopy with bilateral retrograde pyelogram, right ureteroscopy and right stent placement on November 26, 2020.     History of DVT.  EMS was called she was hypoxic to 70% per EMS.  In the ED patient was lethargic. Blood pressure was soft 86/66, with leukocytosis abnormal UA, lactic acidosis elevated creatinine patient was admitted.  Her hemoglobin had dropped to 7.1 g and was transfused 1 units on admission.  Surgery was consulted for hematoma in the chest wall.  Patient placed on meropenem and admitted for UTI and severe sepsis. H&H dropped further assessment 6.6 g on 4/11 and additional 1 more unit ordered  Patient has had multiple rehospitalizations in the last 6 months.  Family has met with palliative medicine previously here in the hospital and has had some contact with community-based palliative.  Family face treatment option decisions, advanced directive decisions and anticipatory care needs.  Clinical Assessment and Goals of Care:  This NP Rebecca Pruitt reviewed medical records, received report from team, assessed the patient and then spoke from the patient's bedside with her daughter/ Rebecca Pruitt  to discuss diagnosis, prognosis, [place], EOL wishes  disposition and options.   Concept of Palliative Care was introduced as specialized medical care for people and their families living with serious illness.  If focuses on providing relief from the symptoms and stress of a serious illness.  The goal is to improve quality of life for both the patient and the family.   Values and goals of care important to patient and family were attempted to be elicited.  Created space and opportunity for patient  and family to explore thoughts and feelings regarding current medical situation.  She verbalizes some frustration with feeling that not enough is being done and that she herself knows her mother best, she worries that sometimes medical providers thinks she is further decline cognitively than she actually is. She has worked well with Rebecca Pruitt in the past and feels it would be helpful if he could see her here in the hospital.  Will consult urology today.  Education offered on the concept of adult failure to thrive and the limitations of medical interventions to prolong quality of life when the body does fail to thrive.  We discussed human mortality.  Ultimately daughter's hope for her mother is "I just wanted to be happy"  A  discussion was had today regarding advanced directives.  Concepts specific to code  status, artifical feeding and hydration, continued IV antibiotics and rehospitalization was had.  The difference between a aggressive medical intervention path  and a palliative comfort care path for this patient at this time was had.    Questions and concerns addressed.   Family  encouraged to call with questions or concerns.     PMT will continue to support holistically.     NEXT OF KIN/daughter    SUMMARY OF RECOMMENDATIONS    Code Status/Advance Care Planning:  Limited code   There is an OP DNR/DNI document   Palliative Prophylaxis:   Aspiration, Bowel Regimen, Delirium Protocol, Frequent Pain Assessment and Oral  Care  Additional Recommendations (Limitations, Scope, Preferences):  Full Scope Treatment  Psycho-social/Spiritual:   Desire for further Chaplaincy support:yes  Additional Recommendations: Education on Hospice  Prognosis:   Unable to determine  Discharge Planning: To Be Determined      Primary Diagnoses: Present on Admission: . UTI (urinary tract infection) . Acute metabolic encephalopathy . Atrial fibrillation, chronic ([place]) . Dementia ([place]) . Hematoma (nontraumatic) of breast   I have reviewed the medical record, interviewed the patient and family, and examined the patient. The following aspects are pertinent.  Past Medical History:  Diagnosis Date  . Cancer ([place])    skin  . Coronary artery disease   . Diabetes mellitus   . Diabetic retinopathy   . Dyslipidemia   . Family history of adverse reaction to anesthesia    pts daughter vomits and has very difficult time to awaken  . H/O: CVA (cardiovascular accident)   . Hearing loss   . Heart murmur   . History of kidney stones   . Hypertension   . Stroke [facility]) 03/2020   x5   Social History   Socioeconomic History  . Marital status: Widowed    Spouse name: Not on file  . Number of children: 1  . Years of education: Not on file  . Highest education level: Not on file  Occupational History  . Occupation: retired  Tobacco Use  . Smoking status: Never Smoker  . Smokeless tobacco: Never Used  Vaping Use  . Vaping Use: Never used  Substance and Sexual Activity  . Alcohol use: No  . Drug use: Never  . Sexual activity: Not on file  Other Topics Concern  . Not on file  Social History Narrative  . Not on file   Social Determinants of Health   Financial Resource Strain: Not on file  Food Insecurity: Not on file  Transportation Needs: Not on file  Physical Activity: Not on file  Stress: Not on file  Social Connections: Not on file   Family History  Problem Relation Age of Onset  . Prostate cancer  Other    Scheduled Meds: . sodium chloride   Intravenous Once  . atorvastatin  40 mg Oral Daily  . Chlorhexidine Gluconate Cloth  6 each Topical Daily  . donepezil  10 mg Oral QHS  . insulin aspart  0-5 Units Subcutaneous QHS  . insulin aspart  0-9 Units Subcutaneous TID WC  . melatonin  3 mg Oral QHS   Continuous Infusions: . sodium chloride 30 mL/hr at 01/28/21 1448  . cefTRIAXone (ROCEPHIN)  IV 1 g (01/29/21 0911)   PRN Meds:.mupirocin ointment, ondansetron **OR** ondansetron (ZOFRAN) IV Medications Prior to Admission:  Prior to Admission medications   Medication Sig Start Date End Date Taking? Authorizing Provider  apixaban (ELIQUIS) 5 MG TABS tablet Take 1 tablet (5 mg  total) by mouth 2 (two) times daily. Start after completing the starter pack Patient taking differently: Take 5 mg by mouth 2 (two) times daily. 02/06/21  Yes Mercy Riding, MD  atorvastatin (LIPITOR) 40 MG tablet Take 1 tablet (40 mg total) by mouth daily. 12/21/20 03/21/21 Yes Hall, Carole N, DO  CRANBERRY PO Take 1 tablet by mouth 2 (two) times daily.   Yes [provider]  donepezil (ARICEPT) 10 MG tablet Take 10 mg by mouth at bedtime.   Yes [provider]  furosemide (LASIX) 20 MG tablet Take 1 tablet (20 mg total) by mouth daily as needed for fluid or edema (swelling, weight gain >2 lbs). Patient taking differently: Take 10 mg by mouth daily as needed for fluid or edema (swelling, weight gain >2 lbs). 12/21/20  Yes Hall, Carole N, DO  insulin NPH-insulin regular (NOVOLIN 70/30) (70-30) 100 UNIT/ML injection Inject 1-10 Units into the skin 3 (three) times daily as needed (high blood sugar). If blood sugar is over 150 takes 1 unit ; sliding scale   Yes [provider]  melatonin 3 MG TABS tablet Take 3 mg by mouth at bedtime.   Yes [provider]  mupirocin ointment (BACTROBAN) 2 % Apply 1 application topically daily as needed (cuts).   Yes [provider]  OXYGEN Inhale 2  L into the lungs at bedtime.   Yes [provider]  Insulin Pen Needle (B-D ULTRAFINE III SHORT PEN) 31G X 8 MM MISC See admin instructions. 12/13/20   [provider]   Allergies  Allergen Reactions  . Codeine Other (See Comments)    Syncope    Review of Systems  Unable to perform ROS: Mental status change    Physical Exam Constitutional:      Appearance: She is cachectic. She is ill-appearing.  Cardiovascular:     Rate and Rhythm: Normal rate.  Pulmonary:     Effort: Pulmonary effort is normal.  Musculoskeletal:     Comments: Generalized weakness and muscle atrophy  Skin:    General: Skin is warm and dry.     Comments: -Noted areas of bruising and hematoma per medical records   Neurological:     Mental Status: She is alert.     Comments: intermittently confused     Vital Signs: BP 110/61 (BP Location: Right Arm)   Pulse 74   Temp 99.6 F (37.6 C) (Oral)   Resp 16   Wt 50.3 kg   SpO2 97%   BMI 19.03 kg/m  Pain Scale: PAINAD   Pain Score: 0-No pain   SpO2: SpO2: 97 % O2 Device:SpO2: 97 % O2 Flow Rate: .O2 Flow Rate (L/min): 2 L/min  IO: Intake/output summary:   Intake/Output Summary (Last 24 hours) at 01/29/2021 9758 Last data filed at 01/29/2021 0447 Gross per 24 hour  Intake 3143.75 ml  Output 1500 ml  Net 1643.75 ml    LBM: Last BM Date: 01/26/21 Baseline Weight: Weight: 50.3 kg Most recent weight: Weight: 50.3 kg     Palliative Assessment/Data:30 % at best   Discussed with Dr Lupita Leash  Time In: 1000 Time Out: 1115 Time Total: 75 minutes Greater than 50%  of this time was spent counseling and coordinating care related to the above assessment and plan.  Signed by: Rebecca Lessen, NP   Please contact Palliative Medicine Team phone at [phone] for questions and concerns.  For individual provider: See Shea Evans

## 2021-01-30 DIAGNOSIS — N3001 Acute cystitis with hematuria: Secondary | ICD-10-CM | POA: Diagnosis not present

## 2021-01-30 LAB — CBC
HCT: 27.4 % — ABNORMAL LOW (ref 36.0–46.0)
Hemoglobin: 8.7 g/dL — ABNORMAL LOW (ref 12.0–15.0)
MCH: 27.5 pg (ref 26.0–34.0)
MCHC: 31.8 g/dL (ref 30.0–36.0)
MCV: 86.7 fL (ref 80.0–100.0)
Platelets: 290 10*3/uL (ref 150–400)
RBC: 3.16 MIL/uL — ABNORMAL LOW (ref 3.87–5.11)
RDW: 16.1 % — ABNORMAL HIGH (ref 11.5–15.5)
WBC: 6.2 10*3/uL (ref 4.0–10.5)
nRBC: 0 % (ref 0.0–0.2)

## 2021-01-30 LAB — BASIC METABOLIC PANEL
Anion gap: 6 (ref 5–15)
BUN: 14 mg/dL (ref 8–23)
CO2: 23 mmol/L (ref 22–32)
Calcium: 9.1 mg/dL (ref 8.9–10.3)
Chloride: 107 mmol/L (ref 98–111)
Creatinine, Ser: 0.56 mg/dL (ref 0.44–1.00)
GFR, Estimated: >60 mL/min (ref >60)
Glucose, Bld: 100 mg/dL — ABNORMAL HIGH (ref 70–99)
Potassium: 3.5 mmol/L (ref 3.5–5.1)
Sodium: 136 mmol/L (ref 135–145)

## 2021-01-30 LAB — GLUCOSE, CAPILLARY
Glucose-Capillary: 95 mg/dL (ref 70–99)
Glucose-Capillary: 111 mg/dL — ABNORMAL HIGH (ref 70–99)
Glucose-Capillary: 94 mg/dL (ref 70–99)
Glucose-Capillary: 205 mg/dL — ABNORMAL HIGH (ref 70–99)

## 2021-01-30 MED ORDER — ENSURE ENLIVE PO LIQD
237.0000 mL | Freq: Three times a day (TID) | ORAL | Status: DC
  Administered 2021-01-30 – 2021-02-02 (×6): 237 mL via ORAL

## 2021-01-30 MED ORDER — ADULT MULTIVITAMIN W/MINERALS CH
1.0000 | ORAL_TABLET | Freq: Every day | ORAL | Status: DC
  Administered 2021-01-30 – 2021-02-02 (×4): 1 via ORAL
  Filled 2021-01-30 (×4): qty 1

## 2021-01-30 NOTE — Plan of Care (Signed)
  Problem: Clinical Measurements: Goal: Ability to maintain clinical measurements within normal limits will improve Outcome: Progressing   

## 2021-01-30 NOTE — Progress Notes (Signed)
PROGRESS NOTE    [name]  PJA:[id number] DOB: 06-Nov-1928 DOA: 01/26/2021 PCP: Vicenta Aly, FNP   Chief Complaint  Patient presents with  . Altered Mental Status  Brief Narrative: 87 year old female is seen within medical history of dementia, diabetes, coronary artery disease, hypertension, CVA on Eliquis brought due to weakness and right chest wall swelling as well as  altered mental status. On admission patient daughter reported multiple urinary tract infection lately, history of DVT. EMS was called she was hypoxic to 70% per EMS.  In the ED patient was lethargic. Blood pressure was soft 86/66, with leukocytosis abnormal UA, lactic acidosis elevated creatinine patient was admitted.  Her hemoglobin had dropped to 7.1 g and was transfused 1 units on admission. Surgery was consulted for hematoma in the chest wall.  Patient placed on meropenem and admitted for UTI and severe sepsis. H&H dropped further assessment 6.6 g on 4/11 and additional 1 more unit given. Hemoglobin has improved since.  Patient is more alert awake oriented to self.  Subjective: Seen this morning.  Patient is alert awake hard of hearing, able to tell me her name.  Nursing feeding her this morning.   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  Assessment & Plan:  Suspected Severe Sepsis POA 2/2 recurrent UTI w/ leukocytosis, lactic acidosis, hypotension: Treated with meropenem due to history of ESBL E. Coli-urine culture less than 10,000 colonies antibiotic changed to ceftriaxone-will complete short course.  As per daughter's request patient's urology is consulted given previous procedure cystoscopy/stent back in February.Leukocytosis has resolved, is afebrile blood pressure stable. UA on admission wbc >50, rbc >50, yeast +, non sp epith +, nitrite + and LE large.Blood culture no growth so far, She was previously seen by infectious disease-last office visit 2/28 by Dr. Juleen China. She is also  followed by urology as OP.  Foley catheter discontinued 4/11 monitor voiding.   Recent Labs  Lab 01/26/21 1228 01/26/21 1610 01/26/21 2121 01/27/21 0254 01/28/21 0341 01/29/21 0611 01/30/21 0245  WBC 12.6*  --   --  17.8* 10.9* 8.5 6.2  LATICACIDVEN  --  2.4* 2.7*  --   --   --   --    Acute blood loss anemia Chest wall hematoma: In the setting of anticoagulation, 2/2 ?trauma. CT shows a large mass in the right pectoralis muscle well-circumscribed consistent with hematoma likely from local trauma.  Surgery following and no need for drainage at this time and will continue to hold Eliquis.  Surgery signed off. H&H has dropped needing 2 units PRBC so far.  Hemoglobin stable continue to monitor.If H&H continues to drop will needs to d/w surgery.  Acute metabolic encephalopathy in the setting of dementia with worsening mental status likely multifactorial with possible UTI and AKI.  Is alert awake oriented to self appears to be improving.  PT OT as tolerated.  Acute on chronic hypoxic resp failure, needing NRBon admission, needing 2 L nasal cannula at bedtime at home.  Respiratory status is stable currently.  AKI, baseline creatinine 0.7 01/24/2021, and 1.5 since admission-creatinine improved to 0.7.  Off IV fluids.  Encourage oral intake.   Recent Labs  Lab 01/26/21 1228 01/27/21 0254 01/28/21 0341 01/29/21 0611 01/30/21 0245  BUN 32* 38* 30* 19 14  CREATININE 1.55* 1.57* 1.23* 0.70 0.56   Chronic atrial fibrillation/history of DVT rate controll, anticoagulation on hold due to chest wall hematoma and blood loss anemia-she will need to follow-up with PCP or with her  cardiologist to resume it once CBC stable on outpatient if family still wants to pursue anticoagulation,.  Acute blood loss anemia due to chest wall hematoma status post 2 units PRBC so far.  H&H remained stable.  Monitor  Recent Labs  Lab 01/27/21 0254 01/28/21 0341 01/28/21 1109 01/29/21 0611 01/30/21 0245  HGB 8.7*  6.6* 8.4* 8.2* 8.7*  HCT 27.3* 21.0* 25.8* 25.4* 27.4*   DM2: Hemoglobin A1c 6.6 back in February 2022, blood sugar is stable.  Monitor Recent Labs  Lab 01/29/21 1148 01/29/21 1649 01/29/21 2032 01/30/21 0636 01/30/21 1138  GLUCAP 182* 144* 94 95 111*    Deconditioning/contractures PT OT evaluation may benefit with brace.  Supportive care.  NTI:RWERXVQ code.palliative care has been consulted and had extensive discussion with daughter today.  Given her complex comorbidities, dementia AND  advanced age as prognosis appears to be guarded.   Diet Order            Diet heart healthy/carb modified Room service appropriate? Yes; Fluid consistency: Thin  Diet effective now               Patient's Body mass index is 19.03 kg/m.  DVT prophylaxis: SCDs Start: 01/26/21 2141 Code Status:   Code Status: Partial Code  Family Communication: plan of care discussed with patient's daughter [name] over the phone.    Status is: Inpatient Remains inpatient appropriate because:Inpatient level of care appropriate due to severity of illness Dispo: The patient is from: Home              Anticipated d/c is to: Home with return of home health services PT OT Aed and RN- already ordered face to face.              Patient currently is not medically stable to d/c.   Difficult to place patient No Unresulted Labs (From admission, onward)          Start     Ordered   Unscheduled  Occult blood card to lab, stool  As needed,   R      01/28/21 0432        Medications reviewed:  Scheduled Meds: . sodium chloride   Intravenous Once  . atorvastatin  40 mg Oral Daily  . Chlorhexidine Gluconate Cloth  6 each Topical Daily  . donepezil  10 mg Oral QHS  . insulin aspart  0-5 Units Subcutaneous QHS  . insulin aspart  0-9 Units Subcutaneous TID WC  . melatonin  3 mg Oral QHS   Continuous Infusions:   Consultants:see note  Procedures:see note  Antimicrobials: Anti-infectives (From admission,  onward)   Start     Dose/Rate Route Frequency Ordered Stop   01/28/21 1445  cefTRIAXone (ROCEPHIN) 1 g in sodium chloride 0.9 % 100 mL IVPB        1 g 200 mL/hr over 30 Minutes Intravenous Every 24 hours 01/28/21 1352 01/30/21 1202   01/26/21 1830  meropenem (MERREM) 500 mg in sodium chloride 0.9 % 100 mL IVPB  Status:  Discontinued        500 mg 200 mL/hr over 30 Minutes Intravenous Every 12 hours 01/26/21 1806 01/28/21 1352     Culture/Microbiology    Component Value Date/Time   SDES URINE, RANDOM 01/26/2021 1550   SPECREQUEST NONE 01/26/2021 1550   CULT (A) 01/26/2021 1550    <10,000 COLONIES/mL INSIGNIFICANT GROWTH Performed at [facility] [address], [place], [place] [address]    REPTSTATUS 01/27/2021 FINAL  01/26/2021 1550    Other culture-see note  Objective: Vitals: Today's Vitals   01/29/21 0938 01/29/21 1652 01/29/21 2022 01/30/21 0511  BP: 109/63 130/73 (!) 155/66 (!) 145/81  Pulse: 69 74 79 76  Resp: 17 17 18 18   Temp: 98.3 F (36.8 C) 98 F (36.7 C) 98.5 F (36.9 C) 98.4 F (36.9 C)  TempSrc:   Oral Oral  SpO2: 99% 100% 97% 96%  Weight:      PainSc:        Intake/Output Summary (Last 24 hours) at 01/30/2021 1321 Last data filed at 01/30/2021 0800 Gross per 24 hour  Intake 360 ml  Output 250 ml  Net 110 ml   Filed Weights   01/26/21 1237  Weight: 50.3 kg   Weight change:   Intake/Output from previous day: 04/12 0701 - 04/13 0700 In: 780 [P.O.:480; I.V.:200; IV Piggyback:100] Out: 650 [Urine:650] Intake/Output this shift: Total I/O In: 120 [P.O.:120] Out: -  Filed Weights   01/26/21 1237  Weight: 50.3 kg    Examination: General exam: AAOx3, elderly frail NAD, weak appearing. HEENT:Oral mucosa moist, Ear/Nose WNL grossly, dentition normal. Respiratory system: bilaterally air entry present,no wheezing or crackles,no use of accessory muscle Cardiovascular system: S1 & S2 +, No JVD,. Gastrointestinal system: Abdomen soft, NT,ND,  BS+ Nervous System:Alert, awake, moving extremities and grossly nonfocal Extremities: No edema, distal peripheral pulses palpable.  Left upper extremity contracture present Skin: No rashes,no icterus. MSK: Thin muscle bulk,tone, power Right chest wall swelling-hematoma in right lower extremity swelling from hematoma present  Data Reviewed: I have personally reviewed following labs and imaging studies CBC: Recent Labs  Lab 01/24/21 1520 01/26/21 1228 01/27/21 0254 01/28/21 0341 01/28/21 1109 01/29/21 0611 01/30/21 0245  WBC 7.6 12.6* 17.8* 10.9*  --  8.5 6.2  NEUTROABS 5.6 10.2*  --   --   --   --   --   HGB 9.3* 7.1* 8.7* 6.6* 8.4* 8.2* 8.7*  HCT 31.1* 24.3* 27.3* 21.0* 25.8* 25.4* 27.4*  MCV 85.7 87.4 86.1 87.1  --  86.4 86.7  PLT 302 398 320 262  --  271 627   Basic Metabolic Panel: Recent Labs  Lab 01/26/21 1228 01/27/21 0254 01/28/21 0341 01/29/21 0611 01/30/21 0245  NA 139 135 138 135 136  K 4.5 4.6 4.1 3.7 3.5  CL 106 102 107 107 107  CO2 26 23 27 23 23   GLUCOSE 269* 193* 193* 134* 100*  BUN 32* 38* 30* 19 14  CREATININE 1.55* 1.57* 1.23* 0.70 0.56  CALCIUM 9.5 9.2 9.1 8.9 9.1   GFR: Estimated Creatinine Clearance: 37.9 mL/min (by C-G formula based on SCr of 0.56 mg/dL). Liver Function Tests: Recent Labs  Lab 01/24/21 1520 01/26/21 1228 01/27/21 0254  AST 21 16 15   ALT 15 11 11   ALKPHOS 47 43 45  BILITOT 0.9 0.5 1.0  PROT 7.0 6.0* 6.0*  ALBUMIN 2.8* 2.3* 2.5*   No results for input(s): LIPASE, AMYLASE in the last 168 hours. No results for input(s): AMMONIA in the last 168 hours. Coagulation Profile: Recent Labs  Lab 01/26/21 1228  INR 2.0*   Cardiac Enzymes: No results for input(s): CKTOTAL, CKMB, CKMBINDEX, TROPONINI in the last 168 hours. BNP (last 3 results) No results for input(s): PROBNP in the last 8760 hours. HbA1C: No results for input(s): HGBA1C in the last 72 hours. CBG: Recent Labs  Lab 01/29/21 1148 01/29/21 1649  01/29/21 2032 01/30/21 0636 01/30/21 1138  GLUCAP 182* 144* 94 95 111*  Lipid Profile: No results for input(s): CHOL, HDL, LDLCALC, TRIG, CHOLHDL, LDLDIRECT in the last 72 hours. Thyroid Function Tests: No results for input(s): TSH, T4TOTAL, FREET4, T3FREE, THYROIDAB in the last 72 hours. Anemia Panel: No results for input(s): VITAMINB12, FOLATE, FERRITIN, TIBC, IRON, RETICCTPCT in the last 72 hours. Sepsis Labs: Recent Labs  Lab 01/26/21 1610 01/26/21 2121  LATICACIDVEN 2.4* 2.7*    Recent Results (from the past 240 hour(s))  Urine culture     Status: Abnormal   Collection Time: 01/24/21  4:18 PM   Specimen: Urine, Random  Result Value Ref Range Status   Specimen Description   Final    URINE, RANDOM Performed at [facility] [address] [place], [place] [address]    Special Requests   Final    NONE Performed at [facility], [place] [address], [place], [place] [address]    Culture (A)  Final    <10,000 COLONIES/mL INSIGNIFICANT GROWTH Performed at [facility] [address], [place], [place] [address]    Report Status 01/26/2021 FINAL  Final  Resp Panel by RT-PCR (Flu A&B, Covid) Nasopharyngeal Swab     Status: None   Collection Time: 01/24/21  4:33 PM   Specimen: Nasopharyngeal Swab; Nasopharyngeal(NP) swabs in vial transport medium  Result Value Ref Range Status   SARS Coronavirus 2 by RT PCR NEGATIVE NEGATIVE Final    Comment: (NOTE) SARS-CoV-2 target nucleic acids are NOT DETECTED.  The SARS-CoV-2 RNA is generally detectable in upper respiratory specimens during the acute phase of infection. The lowest concentration of SARS-CoV-2 viral copies this assay can detect is 138 copies/mL. A negative result does not preclude SARS-Cov-2 infection and should not be used as the sole basis for treatment or other patient management decisions. A negative result may occur with  improper specimen collection/handling, submission  of specimen other than nasopharyngeal swab, presence of viral mutation(s) within the areas targeted by this assay, and inadequate number of viral copies(<138 copies/mL). A negative result must be combined with clinical observations, patient history, and epidemiological information. The expected result is Negative.  Fact Sheet for Patients:  EntrepreneurPulse.com.au  Fact Sheet for Healthcare Providers:  IncredibleEmployment.be  This test is no t yet approved or cleared by the [place] FDA and  has been authorized for detection and/or diagnosis of SARS-CoV-2 by FDA under an Emergency Use Authorization (EUA). This EUA will remain  in effect (meaning this test can be used) for the duration of the COVID-19 declaration under Section 564(b)(1) of the Act, 21 U.S.C.section 360bbb-3(b)(1), unless the authorization is terminated  or revoked sooner.       Influenza A by PCR NEGATIVE NEGATIVE Final   Influenza B by PCR NEGATIVE NEGATIVE Final    Comment: (NOTE) The Xpert Xpress SARS-CoV-2/FLU/RSV plus assay is intended as an aid in the diagnosis of influenza from Nasopharyngeal swab specimens and should not be used as a sole basis for treatment. Nasal washings and aspirates are unacceptable for Xpert Xpress SARS-CoV-2/FLU/RSV testing.  Fact Sheet for Patients: EntrepreneurPulse.com.au  Fact Sheet for Healthcare Providers: IncredibleEmployment.be  This test is not yet approved or cleared by the [place] FDA and has been authorized for detection and/or diagnosis of SARS-CoV-2 by FDA under an Emergency Use Authorization (EUA). This EUA will remain in effect (meaning this test can be used) for the duration of the COVID-19 declaration under Section 564(b)(1) of the Act, 21 U.S.C. section 360bbb-3(b)(1), unless the authorization is terminated or revoked.  Performed at [facility], [place]  [address] [place], [place] [address]   Blood culture (routine x 2)     Status: None (Preliminary result)   Collection Time: 01/26/21 12:44 PM   Specimen: BLOOD  Result Value Ref Range Status   Specimen Description BLOOD SITE NOT SPECIFIED  Final   Special Requests   Final    BOTTLES DRAWN AEROBIC AND ANAEROBIC Blood Culture results may not be optimal due to an inadequate volume of blood received in culture bottles   Culture   Final    NO GROWTH 4 DAYS Performed at [facility], [place] [address], [place], [place] [address]    Report Status PENDING  Incomplete  Resp Panel by RT-PCR (Flu A&B, Covid) Nasopharyngeal Swab     Status: None   Collection Time: 01/26/21  1:00 PM   Specimen: Nasopharyngeal Swab; Nasopharyngeal(NP) swabs in vial transport medium  Result Value Ref Range Status   SARS Coronavirus 2 by RT PCR NEGATIVE NEGATIVE Final    Comment: (NOTE) SARS-CoV-2 target nucleic acids are NOT DETECTED.  The SARS-CoV-2 RNA is generally detectable in upper respiratory specimens during the acute phase of infection. The lowest concentration of SARS-CoV-2 viral copies this assay can detect is 138 copies/mL. A negative result does not preclude SARS-Cov-2 infection and should not be used as the sole basis for treatment or other patient management decisions. A negative result may occur with  improper specimen collection/handling, submission of specimen other than nasopharyngeal swab, presence of viral mutation(s) within the areas targeted by this assay, and inadequate number of viral copies(<138 copies/mL). A negative result must be combined with clinical observations, patient history, and epidemiological information. The expected result is Negative.  Fact Sheet for Patients:  EntrepreneurPulse.com.au  Fact Sheet for Healthcare Providers:  IncredibleEmployment.be  This test is no t yet approved or cleared by the [place] FDA and  has been  authorized for detection and/or diagnosis of SARS-CoV-2 by FDA under an Emergency Use Authorization (EUA). This EUA will remain  in effect (meaning this test can be used) for the duration of the COVID-19 declaration under Section 564(b)(1) of the Act, 21 U.S.C.section 360bbb-3(b)(1), unless the authorization is terminated  or revoked sooner.       Influenza A by PCR NEGATIVE NEGATIVE Final   Influenza B by PCR NEGATIVE NEGATIVE Final    Comment: (NOTE) The Xpert Xpress SARS-CoV-2/FLU/RSV plus assay is intended as an aid in the diagnosis of influenza from Nasopharyngeal swab specimens and should not be used as a sole basis for treatment. Nasal washings and aspirates are unacceptable for Xpert Xpress SARS-CoV-2/FLU/RSV testing.  Fact Sheet for Patients: EntrepreneurPulse.com.au  Fact Sheet for Healthcare Providers: IncredibleEmployment.be  This test is not yet approved or cleared by the [place] FDA and has been authorized for detection and/or diagnosis of SARS-CoV-2 by FDA under an Emergency Use Authorization (EUA). This EUA will remain in effect (meaning this test can be used) for the duration of the COVID-19 declaration under Section 564(b)(1) of the Act, 21 U.S.C. section 360bbb-3(b)(1), unless the authorization is terminated or revoked.  Performed at [facility], [place] [address] [place], [place] [address]   Blood culture (routine x 2)     Status: None (Preliminary result)   Collection Time: 01/26/21  1:30 PM   Specimen: BLOOD  Result Value Ref Range Status   Specimen Description BLOOD SITE NOT SPECIFIED  Final   Special Requests   Final  BOTTLES DRAWN AEROBIC AND ANAEROBIC Blood Culture results may not be optimal due to an inadequate volume of blood received in culture bottles   Culture   Final    NO GROWTH 4 DAYS Performed at [facility], [place] [address] [place], [place] [address]    Report Status PENDING   Incomplete  Urine Culture     Status: Abnormal   Collection Time: 01/26/21  3:50 PM   Specimen: Urine, Random  Result Value Ref Range Status   Specimen Description URINE, RANDOM  Final   Special Requests NONE  Final   Culture (A)  Final    <10,000 COLONIES/mL INSIGNIFICANT GROWTH Performed at [facility], [place] [address] [place], [place] [address]    Report Status 01/27/2021 FINAL  Final     Radiology Studies: No results found.   LOS: 4 days   Antonieta Pert, MD [facility]  01/30/2021, 1:21 PM

## 2021-01-30 NOTE — Progress Notes (Signed)
Asked patient if ready to eat lunch and patient said not now.

## 2021-01-30 NOTE — TOC Initial Note (Signed)
ransition of Care [facility]) - Initial/Assessment Note    Patient Details  Name: Rebecca Pruitt MRN: [id number] Date of Birth: 02/21/1929  Transition of Care [facility]) CM/SW Contact:    Ninfa Meeker, RN Phone Number: 01/30/2021, 10:23 AM  242-Clinical Narrative:  Case manager spoke with patient's daughter- [name] [phone]. Discussed need for Home Health services. [name] states they are already active with [facility], and the only addition she requests is a Engineer, production. Case manager has call out to [facility] Liaison. MD will enter resumption of Home Heatlh services order. No DME needs. TOC Team will continue to monitor.     Expected Discharge Plan: [place] Barriers to Discharge: No Barriers Identified   Patient Goals and CMS Choice     Choice offered to / list presented to : Adult Children  Expected Discharge Plan and Services Expected Discharge Plan: [place]   Discharge Planning Services: CM Consult Post Acute Care Choice: [place] arrangements for the past 2 months: Single Family Home                 DME Arranged: N/A DME Agency: NA       HH Arranged: RN,PT,OT,Nurse's Aide HH Agency: [facility] Date HH Agency Contacted: 01/30/21      Prior Living Arrangements/Services Living arrangements for the past 2 months: Single Family Home Lives with:: Adult Children Patient language and need for interpreter reviewed:: Yes        Need for Family Participation in Patient Care: Yes (Comment) Care giver support system in place?: Yes (comment) Current home services: Home OT,Home PT,Home RN Criminal Activity/Legal Involvement Pertinent to Current Situation/Hospitalization: No - Comment as needed  Activities of Daily Living      Permission Sought/Granted         Permission granted to share info w AGENCY: [facility]        Emotional Assessment       Orientation: : Oriented to  Self Alcohol / Substance Use: Not Applicable Psych Involvement: No (comment)  Admission diagnosis:  UTI (urinary tract infection) [N39.0] Hematoma of right chest wall, initial encounter [S20.211A] Altered mental status, unspecified altered mental status type [R41.82] Anemia, unspecified type [D64.9] Sepsis, due to unspecified organism, unspecified whether acute organ dysfunction present [facility]) [A41.9] Patient Active Problem List   Diagnosis Date Noted  . Hematoma (nontraumatic) of breast 01/26/2021  . Metabolic encephalopathy 95/63/8756  . DVT (deep venous thrombosis) ([place]) 01/05/2021  . Acute metabolic encephalopathy 43/32/9518  . Atrial fibrillation, chronic ([place]) 12/14/2020  . Anemia 12/14/2020  . Hypokalemia 12/14/2020  . History of CVA (cerebrovascular accident) 12/14/2020  . Pressure injury of skin 11/24/2020  . UTI (urinary tract infection) 11/22/2020  . Debility 11/22/2020  . Dementia ([place]) 11/22/2020  . Abnormal cardiac valve 11/22/2020  . DM2 (diabetes mellitus, type 2) ([place]) 11/22/2020   PCP:  Vicenta Aly, [place] Pharmacy:   [id number] [place], [place] [address] Phone: [phone] Fax: [phone]     Social Determinants of Health (SDOH) Interventions    Readmission Risk Interventions Readmission Risk Prevention Plan 12/20/2020  Transportation Screening Complete  PCP or Specialist Appt within 5-7 Days Complete  Home Care Screening Complete  Medication Review (RN CM) Complete  Some recent data might be hidden

## 2021-01-30 NOTE — Evaluation (Signed)
Occupational Therapy Evaluation Patient Details Name: Rebecca Pruitt MRN: [id number] DOB: 02/11/1929 Today's Date: 01/30/2021    History of Present Illness Rebecca Pruitt is an 87 y/o female admitted on 01/26/21 due to UTI. CT found right chest hematoma. Pt recently d/c from [place] on 01/27/21. PMH includes history of recurrent UTIs, CVA, HTN, CAD, dementia, chronic A-fib, history of DVTs, and HLD.   Clinical Impression   PTA, pt with multiple recent hospital admission. Per chart and family reports, pt was able to use RW to walk with assistance due to low vision. Family assists with bathing/dressing tasks per chart. No family present this AM during session, but pt also reports requiring assistance with meals at home. Today, pt requires Total A for all ADLs bed level, including self feeding breakfast this AM. Did not progress to EOB/OOB today but will likely require +2 physical assistance for these tasks. Assessed L UE with noted contractures at MCP joints and pt grimacing with PROM stretching. Placed 2 rolled washcloths in hand to encourage extension and educated pt to allow washcloths to rest in hand, as pt with tendency to grip tightly. Plan to further assess appropriateness for splinting needs as pt with small hand and unsure if the prefabricated resting hand splints will fit appropriately.   Recommending SNF as pt is requiring increased physical assistance for transfers and is below baseline in this aspect. Noted that family may be planning to take pt home - if so, recommend max HH services, including HHOT and 24/7 +2 physical assistance.     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 and almost deaf in the other (hears better in left ear), dementia at baseline, right chest hematoma, right leg hematoma Restrictions Weight Bearing Restrictions: No      Mobility Bed Mobility Overal bed mobility: Needs Assistance             General bed mobility comments: Total A for adjusting in bed for breakfast, tendency for L lateral lean    Transfers                 General transfer comment: unable    Balance                                           ADL either performed or assessed with clinical judgement   ADL Overall ADL's : Needs assistance/impaired Eating/Feeding: Total assistance;Bed level Eating/Feeding Details (indicate cue type and reason): Total A for self feeding at bed level. Pt requires cues for correcting posture, fatigued after eating a fruit cup Grooming: Total assistance;Bed level   Upper Body Bathing: Total assistance;Bed level   Lower Body Bathing: Bed level;Total assistance   Upper Body Dressing : Total assistance;Bed level   Lower Body Dressing: Total assistance;Bed level       Toileting- Clothing Manipulation and Hygiene: Total assistance;Bed level         General ADL Comments: Overall Total A for ADLs. able to follow directions with increased time, multimodal cues. Placed rolled washcloth in L hand to avoid pt keeping hand in fist (have  concerns that pt may actively grasp washcloth, further increasing flexion - provided education to have hand resting with cloth to protect skin and encourage extension). To be further assessed if resting hand splint would fit based on small hand, MCP contractures.     Vision Baseline Vision/History: Legally blind Patient Visual Report: No change from baseline Vision Assessment?: Vision impaired- to be further tested in functional context Additional Comments: legally blind     Perception     Praxis      Pertinent Vitals/Pain Pain Assessment: Faces Faces Pain Scale:  Hurts little more Pain Location: L hand with ROM, other moaning with generalized pain. pt unable to specify where this pain is located Pain Descriptors / Indicators: Grimacing;Guarding;Moaning Pain Intervention(s): Monitored during session;Repositioned;Limited activity within patient's tolerance     Hand Dominance Left   Extremity/Trunk Assessment Upper Extremity Assessment Upper Extremity Assessment: LUE deficits/detail;RUE deficits/detail RUE Deficits / Details: no active movement noted during session today, though pt reports R hand dominant. To be further assessed RUE Coordination: decreased fine motor;decreased gross motor LUE Deficits / Details: flexor tone with contractures noted. PIP/DIP able to be stretched with MCPs to about 75* before increased grimacing and unable to stretch further LUE Coordination: decreased fine motor;decreased gross motor   Lower Extremity Assessment Lower Extremity Assessment: Defer to PT evaluation   Cervical / Trunk Assessment Cervical / Trunk Assessment: Kyphotic Cervical / Trunk Exceptions: L lateral lean, R chest hematoma   Communication Communication Communication: HOH   Cognition Arousal/Alertness: Awake/alert Behavior During Therapy: Flat affect Overall Cognitive Status: History of cognitive impairments - at baseline Area of Impairment: Orientation;Attention;Memory;Following commands;Safety/judgement;Awareness;Problem solving                 Orientation Level: Disoriented to;Place;Time;Situation (Reports unsure of where she is, but able to state "hospital" when given choices) Current Attention Level: Sustained Memory: Decreased short-term memory Following Commands: Follows one step commands inconsistently;Follows one step commands with increased time Safety/Judgement: Decreased awareness of safety;Decreased awareness of deficits Awareness: Intellectual Problem Solving: Slow processing;Decreased initiation;Requires verbal  cues;Requires tactile cues;Difficulty sequencing General Comments: Pt responding to questions with increased time and occassional tactile stimulation. Answering questions appropriate. Needed increased verbal and tactile cueing to stay on task   General Comments       Exercises     Shoulder Instructions      Home Living Family/patient expects to be discharged to:: Private residence Living Arrangements: Children Available Help at Discharge: Family;Available 24 hours/day Type of Home: House                       Home Equipment: Walker - 2 wheels;Cane - single point;Bedside commode;Shower seat;Wheelchair - manual;Hospital bed;Other (comment) (shower chair with wheels)   Additional Comments: Pt lives with daughter and son-in-law, who provide assistance. Per daughter, they are well equipped      Prior Functioning/Environment Level of Independence: Needs assistance  Gait / Transfers Assistance Needed: Pt has had series of recent hospitalizations. She was able to walk with RW and assistance (timeline unclear, but sounds like at some point inbetween hospitalizations). Daughter assists with navigating RW since pt is blind. When pt unable to walk, daughter and son-in-law help with stand pivot transfers to wheelchair. ADL's / Homemaking Assistance Needed: Pt needs full assistance for bathing and dressing. Daughter has been wheeling pt into shower in wheelchair appropriate for shower. On OT eval, pt reports assistance needed for meals at home   Comments: Pt poor historian. PLOF/setup from  PT eval with daughter assisting in providing answers        OT Problem List: Impaired UE functional use;Impaired balance (sitting and/or standing);Impaired vision/perception;Decreased strength;Decreased range of motion;Decreased coordination;Decreased cognition;Pain      OT Treatment/Interventions: Self-care/ADL training;Therapeutic exercise;Energy conservation;DME and/or AE instruction;Therapeutic  activities;Patient/family education;Balance training    OT Goals(Current goals can be found in the care plan section) Acute Rehab OT Goals Patient Stated Goal: eat fruit cup OT Goal Formulation: Patient unable to participate in goal setting Time For Goal Achievement: 02/13/21 Potential to Achieve Goals: Fair  OT Frequency: Min 2X/week   Barriers to D/C:            Co-evaluation              AM-PAC OT "6 Clicks" Daily Activity     Outcome Measure Help from another person eating meals?: Total Help from another person taking care of personal grooming?: Total Help from another person toileting, which includes using toliet, bedpan, or urinal?: Total Help from another person bathing (including washing, rinsing, drying)?: Total Help from another person to put on and taking off regular upper body clothing?: Total Help from another person to put on and taking off regular lower body clothing?: Total 6 Click Score: 6   End of Session Equipment Utilized During Treatment: Oxygen Nurse Communication: Mobility status;Other (comment) (assist needed for feeding, further assessment for hand brace)  Activity Tolerance: Patient tolerated treatment well Patient left: in bed;with call bell/phone within reach;with bed alarm set  OT Visit Diagnosis: Muscle weakness (generalized) (M62.81);Pain;Other symptoms and signs involving cognitive function Pain - Right/Left: Left Pain - part of body: Hand                Time: 6568-1275 OT Time Calculation (min): 24 min Charges:  OT General Charges $OT Visit: 1 Visit OT Evaluation $OT Eval Moderate Complexity: 1 Mod OT Treatments $Self Care/Home Management : 8-22 mins  Malachy Chamber, OTR/L Acute Rehab Services Office: [phone]  Layla Maw 01/30/2021, 10:33 AM

## 2021-01-30 NOTE — Progress Notes (Signed)
Asked patient if ready to eat lunch.  Patient said no wanted to sleep.  OT had just been with patient.

## 2021-01-30 NOTE — Progress Notes (Signed)
Initial Nutrition Assessment  DOCUMENTATION CODES:   Severe malnutrition in context of chronic illness  INTERVENTION:  -Calorie count per MD -Ensure Enlive po TID, each supplement provides 350 kcal and 20 grams of protein -Magic cup TID with meals, each supplement provides 290 kcal and 9 grams of protein -Liberalize to regular diet to optimize po intake -MVI with minerals daily  NUTRITION DIAGNOSIS:   Severe Malnutrition related to chronic illness (dementia) as evidenced by severe fat depletion,severe muscle depletion.    GOAL:   Patient will meet greater than or equal to 90% of their needs    MONITOR:   PO intake,Supplement acceptance,Skin,Weight trends,Labs,I & O's  REASON FOR ASSESSMENT:   Consult Calorie Count  ASSESSMENT:   Pt admitted with sepsis POA 2/2 recurrent UTI w/ leukocytosis, lactic acidosis. PMH includes dementia, DM, CAD, HTN, CVA.   RD consulted for calorie count. Pt hearing impaired and unable to provide responses to RD questions. No family at bedside. Discussed pt with RN. Clarified calorie count orders/instructions. RN reports understanding. RD will follow-up with results tomorrow and Friday.   Reviewed weight history. Suspect weight reading from 2/03 (59.9kg) is an error as the following day the pt was noted to weigh 48.4 kg which was more in line with following weights within short time frame.   PO Intake: 0-50% x last 7 recorded meals (19% average meal intake). Will order oral nutrition supplements for pt.   Pt with stage 1 pressure injury to coccyx per RN assessment  UOP: 62ml x24 hours  Medications: SSI TID w/ meals and at bedtime Labs: 95-111  NUTRITION - FOCUSED PHYSICAL EXAM:  Flowsheet Row Most Recent Value  Orbital Region Moderate depletion  Upper Arm Region Severe depletion  Thoracic and Lumbar Region Severe depletion  Buccal Region Moderate depletion  Temple Region Severe depletion  Clavicle Bone Region Severe depletion   Clavicle and Acromion Bone Region Severe depletion  Scapular Bone Region Severe depletion  Dorsal Hand Severe depletion  Patellar Region Severe depletion  Anterior Thigh Region Severe depletion  Posterior Calf Region Severe depletion  Edema (RD Assessment) Mild  Hair Reviewed  Eyes Reviewed  Mouth Reviewed  Skin Reviewed  Nails Reviewed      Diet Order:   Diet Order            Diet heart healthy/carb modified Room service appropriate? Yes; Fluid consistency: Thin  Diet effective now                 EDUCATION NEEDS:   No education needs have been identified at this time  Skin:  Skin Assessment: Skin Integrity Issues: Skin Integrity Issues:: Stage I Stage I: coccyx  Last BM:  4/10  Height:   Ht Readings from Last 1 Encounters:  01/04/21 5\' 4"  (1.626 m)    Weight:   Wt Readings from Last 1 Encounters:  01/26/21 50.3 kg    BMI:  Body mass index is 19.03 kg/m.  Estimated Nutritional Needs:   Kcal:  1500-1700  Protein:  75-85 grams  Fluid:  >1.5L/d    Larkin Ina, MS, RD, LDN RD pager number and weekend/on-call pager number located in [place].

## 2021-01-30 NOTE — Progress Notes (Signed)
Orthopedic Tech Progress Note Patient Details:  [name] Mar 04, 1929 [id number] Called in order to [employer] for a [facility]ING [employer] [facility]  Patient ID: Rebecca Pruitt, female   DOB: 05/11/1929, 87 y.o.   MRN: [id number]   Janit Pagan 01/30/2021, 2:20 PM

## 2021-01-30 NOTE — Consult Note (Signed)
Urology Consult   Physician requesting consult: Dr. Lupita Leash  Reason for consult: Right hydronephrosis and recurrent UTIs  History of Present Illness: [name] is a 87 y.o. followed by Dr. Milford Cage for right hydronephrosis and recurrent UTIs.  She is mostly bed-bound and is cared for by her daughter.  She underwent endoscopic evaluation in February including right ureteral stent placement.  Ultimately, it was felt that her hydronephrosis was likely reflective of vesicoureteral reflux rather than obstruction.  Multiple attempts to remove her ureteral stent in the outpatient setting have been thwarted by either missing her appt due to hospitalization or her status when she presented to the office. As such, despite what appears to be Dr. Milford Cage intention to remove her stent, it currently remains indwelling.  She was admitted to the hospital most recently with a chest wall hematoma, hypoxia, and mental status changes.  She was on meropenem which has now been switched to ceftriaxone although her urine cultures have been negative.   Her mental status is gradually improving since her admission.  Her Foley catheter has been removed.     Past Medical History:  Diagnosis Date  . Cancer ([place])    skin  . Coronary artery disease   . Diabetes mellitus   . Diabetic retinopathy   . Dyslipidemia   . Family history of adverse reaction to anesthesia    pts daughter vomits and has very difficult time to awaken  . H/O: CVA (cardiovascular accident)   . Hearing loss   . Heart murmur   . History of kidney stones   . Hypertension   . Stroke [facility]) 03/2020   x5    Past Surgical History:  Procedure Laterality Date  . CESAREAN SECTION    . CYSTOSCOPY WITH RETROGRADE PYELOGRAM, URETEROSCOPY AND STENT PLACEMENT Right 11/26/2020   Procedure: CYSTOSCOPY WITH RIGHT RETROGRADE PYELOGRAM, RIGHT URETEROSCOPY AND PYELOSCOPY, RIGHT STENT PLACEMENT, FULGERATION OF RIGHT URETER AND BLADDER;  Surgeon: Remi Haggard,  MD;  Location: [facility];  Service: Urology;  Laterality: Right;  . DILATION AND CURETTAGE OF UTERUS    . EYE SURGERY Bilateral   . hysterectomy - unknown type    . MASTOIDECTOMY     as a child     Current Hospital Medications:  Home meds:  No current facility-administered medications on file prior to encounter.   Current Outpatient Medications on File Prior to Encounter  Medication Sig Dispense Refill  . [START ON 02/06/2021] apixaban (ELIQUIS) 5 MG TABS tablet Take 1 tablet (5 mg total) by mouth 2 (two) times daily. Start after completing the starter pack (Patient taking differently: Take 5 mg by mouth 2 (two) times daily.) 60 tablet 2  . atorvastatin (LIPITOR) 40 MG tablet Take 1 tablet (40 mg total) by mouth daily. 90 tablet 0  . CRANBERRY PO Take 1 tablet by mouth 2 (two) times daily.    Marland Kitchen donepezil (ARICEPT) 10 MG tablet Take 10 mg by mouth at bedtime.    . furosemide (LASIX) 20 MG tablet Take 1 tablet (20 mg total) by mouth daily as needed for fluid or edema (swelling, weight gain >2 lbs). (Patient taking differently: Take 10 mg by mouth daily as needed for fluid or edema (swelling, weight gain >2 lbs).) 30 tablet 0  . insulin NPH-insulin regular (NOVOLIN 70/30) (70-30) 100 UNIT/ML injection Inject 1-10 Units into the skin 3 (three) times daily as needed (high blood sugar). If blood sugar is over 150 takes 1 unit ; sliding scale    .  melatonin 3 MG TABS tablet Take 3 mg by mouth at bedtime.    . mupirocin ointment (BACTROBAN) 2 % Apply 1 application topically daily as needed (cuts).    . OXYGEN Inhale 2 L into the lungs at bedtime.    . Insulin Pen Needle (B-D ULTRAFINE III SHORT PEN) 31G X 8 MM MISC See admin instructions.       Scheduled Meds: . sodium chloride   Intravenous Once  . atorvastatin  40 mg Oral Daily  . Chlorhexidine Gluconate Cloth  6 each Topical Daily  . donepezil  10 mg Oral QHS  . feeding supplement  237 mL Oral TID BM  . insulin aspart  0-5 Units Subcutaneous  QHS  . insulin aspart  0-9 Units Subcutaneous TID WC  . melatonin  3 mg Oral QHS  . multivitamin with minerals  1 tablet Oral Daily   Continuous Infusions: PRN Meds:.mupirocin ointment, ondansetron **OR** ondansetron (ZOFRAN) IV  Allergies:  Allergies  Allergen Reactions  . Codeine Other (See Comments)    Syncope     Family History  Problem Relation Age of Onset  . Prostate cancer Other     Social History:  reports that she has never smoked. She has never used smokeless tobacco. She reports that she does not drink alcohol and does not use drugs.  ROS: A complete review of systems was performed.  All systems are negative except for pertinent findings as noted.  Physical Exam:  Vital signs in last 24 hours: Temp:  [98 F (36.7 C)-98.5 F (36.9 C)] 98 F (36.7 C) (04/13 1632) Pulse Rate:  [76-80] 80 (04/13 1632) Resp:  [18] 18 (04/13 1632) BP: (145-180)/(65-81) 180/65 (04/13 1632) SpO2:  [96 %-100 %] 100 % (04/13 1632) Constitutional:  Alert and awake but not oriented to person or place. Cardiovascular:No JVD Respiratory: Normal respiratory effort Abd: NO CVA tenderness GU: Urine in purewick is clear Lymphatic: No lymphadenopathy Neurologic: No focal deficits Psychiatric: Normal mood  Laboratory Data:  Recent Labs    01/28/21 0341 01/28/21 1109 01/29/21 0611 01/30/21 0245  WBC 10.9*  --  8.5 6.2  HGB 6.6* 8.4* 8.2* 8.7*  HCT 21.0* 25.8* 25.4* 27.4*  PLT 262  --  271 290    Recent Labs    01/28/21 0341 01/29/21 0611 01/30/21 0245  NA 138 135 136  K 4.1 3.7 3.5  CL 107 107 107  GLUCOSE 193* 134* 100*  BUN 30* 19 14  CALCIUM 9.1 8.9 9.1  CREATININE 1.23* 0.70 0.56     Results for orders placed or performed during the hospital encounter of 01/26/21 (from the past 24 hour(s))  Glucose, capillary     Status: None   Collection Time: 01/29/21  8:32 PM  Result Value Ref Range   Glucose-Capillary 94 70 - 99 mg/dL  Basic metabolic panel     Status:  Abnormal   Collection Time: 01/30/21  2:45 AM  Result Value Ref Range   Sodium 136 135 - 145 mmol/L   Potassium 3.5 3.5 - 5.1 mmol/L   Chloride 107 98 - 111 mmol/L   CO2 23 22 - 32 mmol/L   Glucose, Bld 100 (H) 70 - 99 mg/dL   BUN 14 8 - 23 mg/dL   Creatinine, Ser 0.56 0.44 - 1.00 mg/dL   Calcium 9.1 8.9 - 10.3 mg/dL   GFR, Estimated >60 >60 mL/min   Anion gap 6 5 - 15  CBC     Status: Abnormal  Collection Time: 01/30/21  2:45 AM  Result Value Ref Range   WBC 6.2 4.0 - 10.5 K/uL   RBC 3.16 (L) 3.87 - 5.11 MIL/uL   Hemoglobin 8.7 (L) 12.0 - 15.0 g/dL   HCT 27.4 (L) 36.0 - 46.0 %   MCV 86.7 80.0 - 100.0 fL   MCH 27.5 26.0 - 34.0 pg   MCHC 31.8 30.0 - 36.0 g/dL   RDW 16.1 (H) 11.5 - 15.5 %   Platelets 290 150 - 400 K/uL   nRBC 0.0 0.0 - 0.2 %  Glucose, capillary     Status: None   Collection Time: 01/30/21  6:36 AM  Result Value Ref Range   Glucose-Capillary 95 70 - 99 mg/dL  Glucose, capillary     Status: Abnormal   Collection Time: 01/30/21 11:38 AM  Result Value Ref Range   Glucose-Capillary 111 (H) 70 - 99 mg/dL  Glucose, capillary     Status: None   Collection Time: 01/30/21  4:33 PM  Result Value Ref Range   Glucose-Capillary 94 70 - 99 mg/dL   Recent Results (from the past 240 hour(s))  Urine culture     Status: Abnormal   Collection Time: 01/24/21  4:18 PM   Specimen: Urine, Random  Result Value Ref Range Status   Specimen Description   Final    URINE, RANDOM Performed at [facility], [place] [address], [place], [place] [address]    Special Requests   Final    NONE Performed at [facility], [place] [address] [place], [place] [address]    Culture (A)  Final    <10,000 COLONIES/mL INSIGNIFICANT GROWTH Performed at [place] [address] [place], [place] [address]    Report Status 01/26/2021 FINAL  Final  Resp Panel by RT-PCR (Flu A&B, Covid) Nasopharyngeal Swab     Status: None   Collection Time: 01/24/21   4:33 PM   Specimen: Nasopharyngeal Swab; Nasopharyngeal(NP) swabs in vial transport medium  Result Value Ref Range Status   SARS Coronavirus 2 by RT PCR NEGATIVE NEGATIVE Final    Comment: (NOTE) SARS-CoV-2 target nucleic acids are NOT DETECTED.  The SARS-CoV-2 RNA is generally detectable in upper respiratory specimens during the acute phase of infection. The lowest concentration of SARS-CoV-2 viral copies this assay can detect is 138 copies/mL. A negative result does not preclude SARS-Cov-2 infection and should not be used as the sole basis for treatment or other patient management decisions. A negative result may occur with  improper specimen collection/handling, submission of specimen other than nasopharyngeal swab, presence of viral mutation(s) within the areas targeted by this assay, and inadequate number of viral copies(<138 copies/mL). A negative result must be combined with clinical observations, patient history, and epidemiological information. The expected result is Negative.  Fact Sheet for Patients:  EntrepreneurPulse.com.au  Fact Sheet for Healthcare Providers:  IncredibleEmployment.be  This test is no t yet approved or cleared by the [place] FDA and  has been authorized for detection and/or diagnosis of SARS-CoV-2 by FDA under an Emergency Use Authorization (EUA). This EUA will remain  in effect (meaning this test can be used) for the duration of the COVID-19 declaration under Section 564(b)(1) of the Act, 21 U.S.C.section 360bbb-3(b)(1), unless the authorization is terminated  or revoked sooner.       Influenza A by PCR NEGATIVE NEGATIVE Final   Influenza B by PCR NEGATIVE NEGATIVE Final    Comment: (NOTE) The Xpert Xpress SARS-CoV-2/FLU/RSV  plus assay is intended as an aid in the diagnosis of influenza from Nasopharyngeal swab specimens and should not be used as a sole basis for treatment. Nasal washings and aspirates  are unacceptable for Xpert Xpress SARS-CoV-2/FLU/RSV testing.  Fact Sheet for Patients: EntrepreneurPulse.com.au  Fact Sheet for Healthcare Providers: IncredibleEmployment.be  This test is not yet approved or cleared by the [place] FDA and has been authorized for detection and/or diagnosis of SARS-CoV-2 by FDA under an Emergency Use Authorization (EUA). This EUA will remain in effect (meaning this test can be used) for the duration of the COVID-19 declaration under Section 564(b)(1) of the Act, 21 U.S.C. section 360bbb-3(b)(1), unless the authorization is terminated or revoked.  Performed at [facility], [place] [address] [place], [place] [address]   Blood culture (routine x 2)     Status: None (Preliminary result)   Collection Time: 01/26/21 12:44 PM   Specimen: BLOOD  Result Value Ref Range Status   Specimen Description BLOOD SITE NOT SPECIFIED  Final   Special Requests   Final    BOTTLES DRAWN AEROBIC AND ANAEROBIC Blood Culture results may not be optimal due to an inadequate volume of blood received in culture bottles   Culture   Final    NO GROWTH 4 DAYS Performed at [facility], [place] [address] [place], [place] [address]    Report Status PENDING  Incomplete  Resp Panel by RT-PCR (Flu A&B, Covid) Nasopharyngeal Swab     Status: None   Collection Time: 01/26/21  1:00 PM   Specimen: Nasopharyngeal Swab; Nasopharyngeal(NP) swabs in vial transport medium  Result Value Ref Range Status   SARS Coronavirus 2 by RT PCR NEGATIVE NEGATIVE Final    Comment: (NOTE) SARS-CoV-2 target nucleic acids are NOT DETECTED.  The SARS-CoV-2 RNA is generally detectable in upper respiratory specimens during the acute phase of infection. The lowest concentration of SARS-CoV-2 viral copies this assay can detect is 138 copies/mL. A negative result does not preclude SARS-Cov-2 infection and should not be used as the sole basis for  treatment or other patient management decisions. A negative result may occur with  improper specimen collection/handling, submission of specimen other than nasopharyngeal swab, presence of viral mutation(s) within the areas targeted by this assay, and inadequate number of viral copies(<138 copies/mL). A negative result must be combined with clinical observations, patient history, and epidemiological information. The expected result is Negative.  Fact Sheet for Patients:  EntrepreneurPulse.com.au  Fact Sheet for Healthcare Providers:  IncredibleEmployment.be  This test is no t yet approved or cleared by the [place] FDA and  has been authorized for detection and/or diagnosis of SARS-CoV-2 by FDA under an Emergency Use Authorization (EUA). This EUA will remain  in effect (meaning this test can be used) for the duration of the COVID-19 declaration under Section 564(b)(1) of the Act, 21 U.S.C.section 360bbb-3(b)(1), unless the authorization is terminated  or revoked sooner.       Influenza A by PCR NEGATIVE NEGATIVE Final   Influenza B by PCR NEGATIVE NEGATIVE Final    Comment: (NOTE) The Xpert Xpress SARS-CoV-2/FLU/RSV plus assay is intended as an aid in the diagnosis of influenza from Nasopharyngeal swab specimens and should not be used as a sole basis for treatment. Nasal washings and aspirates are unacceptable for Xpert Xpress SARS-CoV-2/FLU/RSV testing.  Fact Sheet for Patients: EntrepreneurPulse.com.au  Fact Sheet for Healthcare Providers: IncredibleEmployment.be  This test is not yet approved or cleared by the [place] FDA  and has been authorized for detection and/or diagnosis of SARS-CoV-2 by FDA under an Emergency Use Authorization (EUA). This EUA will remain in effect (meaning this test can be used) for the duration of the COVID-19 declaration under Section 564(b)(1) of the Act, 21  U.S.C. section 360bbb-3(b)(1), unless the authorization is terminated or revoked.  Performed at [facility], [place] [address] [place], [place] [address]   Blood culture (routine x 2)     Status: None (Preliminary result)   Collection Time: 01/26/21  1:30 PM   Specimen: BLOOD  Result Value Ref Range Status   Specimen Description BLOOD SITE NOT SPECIFIED  Final   Special Requests   Final    BOTTLES DRAWN AEROBIC AND ANAEROBIC Blood Culture results may not be optimal due to an inadequate volume of blood received in culture bottles   Culture   Final    NO GROWTH 4 DAYS Performed at [facility], [place] [address] [place], [place] [address]    Report Status PENDING  Incomplete  Urine Culture     Status: Abnormal   Collection Time: 01/26/21  3:50 PM   Specimen: Urine, Random  Result Value Ref Range Status   Specimen Description URINE, RANDOM  Final   Special Requests NONE  Final   Culture (A)  Final    <10,000 COLONIES/mL INSIGNIFICANT GROWTH Performed at [facility], [place] [address], [place], [place] [address]    Report Status 01/27/2021 FINAL  Final    Renal Function: Recent Labs    01/24/21 1520 01/26/21 1228 01/27/21 0254 01/28/21 0341 01/29/21 0611 01/30/21 0245  CREATININE 0.66 1.55* 1.57* 1.23* 0.70 0.56   Estimated Creatinine Clearance: 37.9 mL/min (by C-G formula based on SCr of 0.56 mg/dL).    Impression/Recommendation: Right hydronephrosis with recurrent UTIs:  I spoke at length with the patient's daughter.  It would seem appropriate to consider ureteral stent removal during her hospitalization while she is on broad spectrum antibiotics considering the difficulties in removing it as an outpatient although I will defer to Dr. Milford Cage in this regard.  Her daughter also asked about prophylactic antibiotics after her treatment regimen is completed.  We discussed pros and cons of prophylactic antibiotics considering her history of highly resistant  organisms and ID's recommendations.  Will allow Dr. Milford Cage to address this with her.  Dr. Milford Cage to follow up tomorrow.  Dutch Gray 01/30/2021, 6:28 PM  Pryor Curia. MD   CC: Dr. Antonieta Pert

## 2021-01-31 DIAGNOSIS — G9341 Metabolic encephalopathy: Secondary | ICD-10-CM | POA: Diagnosis not present

## 2021-01-31 DIAGNOSIS — E43 Unspecified severe protein-calorie malnutrition: Secondary | ICD-10-CM | POA: Insufficient documentation

## 2021-01-31 LAB — CULTURE, BLOOD (ROUTINE X 2)
Culture: NO GROWTH
Culture: NO GROWTH

## 2021-01-31 LAB — GLUCOSE, CAPILLARY
Glucose-Capillary: 206 mg/dL — ABNORMAL HIGH (ref 70–99)
Glucose-Capillary: 298 mg/dL — ABNORMAL HIGH (ref 70–99)
Glucose-Capillary: 250 mg/dL — ABNORMAL HIGH (ref 70–99)
Glucose-Capillary: 259 mg/dL — ABNORMAL HIGH (ref 70–99)

## 2021-01-31 MED ORDER — APIXABAN 5 MG PO TABS: 5.0000 mg | ORAL_TABLET | Freq: Two times a day (BID) | ORAL | 2 refills | Status: DC

## 2021-01-31 NOTE — Progress Notes (Signed)
Physical Therapy Treatment Patient Details Name: Rebecca Pruitt MRN: [id number] DOB: 05-06-1929 Today's Date: 01/31/2021    History of Present Illness Rebecca Pruitt is an 87 y/o female admitted on 01/26/21 due to UTI. CT found right chest hematoma. Pt recently d/c from [facility] on 01/27/21. PMH includes history of recurrent UTIs, CVA, HTN, CAD, dementia, chronic A-fib, history of DVTs, and HLD.    PT Comments    Pt resting upon PT arrival to room, awakens easily. Pt very fearful of mobility secondary to pain today, requires increased time and cuing for all mobility. Pt requiring max +1-2 for moving to/from EOB, and tolerated EOB sitting x10 minutes. Pt soiled in urine and purewick creating redness along perineal area, sheets changed, pt cleaned up, and RN notified. See recommendations below, will continue to follow .    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    Transfers                 General transfer comment:  unable  Ambulation/Gait                 Stairs             Wheelchair Mobility    Modified Rankin (Stroke Patients Only)       Balance Overall balance assessment: Needs assistance Sitting-balance support: Feet supported;Bilateral upper extremity supported;No upper extremity supported Sitting balance-Leahy Scale: Fair Sitting balance - Comments: severely kyphotic posture, sacral sitting, but able to sit EOB with only intermittent PT support x10 minutes                                    Cognition Arousal/Alertness: Awake/alert Behavior During Therapy: Flat affect Overall Cognitive Status: History of cognitive impairments - at baseline Area of Impairment: Orientation;Attention;Memory;Following commands;Safety/judgement;Awareness;Problem solving                 Orientation Level: Disoriented to;Place;Time;Situation Current Attention Level: Sustained Memory: Decreased short-term memory Following Commands: Follows one step commands inconsistently;Follows one step commands with increased time Safety/Judgement: Decreased awareness of safety;Decreased awareness of deficits Awareness: Intellectual Problem Solving: Slow processing;Decreased initiation;Requires verbal cues;Requires tactile cues;Difficulty sequencing General Comments: pt speaks in generalizations, anxiety with mobility and vocalizing in pain but once mobility stops pt calms.      Exercises General Exercises - Lower Extremity Heel Slides: AAROM;Both;15 reps;Supine Hip ABduction/ADduction: AAROM;Both;15 reps;Supine    General Comments General comments (skin integrity, edema, etc.): Pt saturated in urine, PT and OT  assisting in linen change as OT entered in middle of PT session to assess splint. Redness noted to pt's perineum and labia when removing and attempting to replace new purewick, pt exclaiming in pain so PT left pt without purewick and notified RN of this/redness       Pertinent Vitals/Pain Pain Assessment: Faces Faces Pain Scale: Hurts whole lot Pain Location: R side, RLE Pain Descriptors / Indicators: Grimacing;Guarding;Moaning Pain Intervention(s): Limited activity within patient's tolerance;Monitored during session;Repositioned    Home Living                      Prior Function            PT Goals (current goals can now be found in the care plan section) Acute Rehab PT Goals Patient Stated Goal: be comfortable in bed PT Goal Formulation: With patient Time For Goal Achievement: 02/12/21 Potential to Achieve Goals: Fair Progress towards PT goals: Progressing toward goals    Frequency    Min 3X/week      PT Plan Current plan remains appropriate    Co-evaluation              AM-PAC PT "6 Clicks" Mobility   Outcome Measure  Help needed turning from your back to your side while in a flat bed without using bedrails?: Total Help needed moving from lying on your back to sitting on the side of a flat bed without using bedrails?: Total Help needed moving to and from a bed to a chair (including a wheelchair)?: Total Help needed standing up from a chair using your arms (e.g., wheelchair or bedside chair)?: Total Help needed to walk in hospital room?: Total Help needed climbing 3-5 steps with a railing? : Total 6 Click Score: 6    End of Session   Activity Tolerance: Patient limited by pain;Patient limited by fatigue Patient left: in bed;with call bell/phone within reach;with bed alarm set Nurse Communication: Mobility status PT Visit Diagnosis: Muscle weakness (generalized) (M62.81);Other abnormalities of gait and mobility (R26.89)     Time: 1130-1159 PT Time Calculation (min) (ACUTE ONLY): 29 min  Charges:  $Therapeutic Activity: 8-22 mins                     Stacie Glaze, PT DPT Acute Rehabilitation Services Pager [phone]  Office [phone]   Roxine Caddy E Ruffin Pyo 01/31/2021, 2:13 PM

## 2021-01-31 NOTE — Progress Notes (Signed)
PROGRESS NOTE    [name]  GEX:[id number] DOB: 1929-04-10 DOA: 01/26/2021 PCP: Vicenta Aly, FNP   Chief Complaint  Patient presents with  . Altered Mental Status  Brief Narrative: 87 year old female is seen within medical history of dementia, diabetes, coronary artery disease, hypertension, CVA on Eliquis brought due to weakness and right chest wall swelling as well as  altered mental status. On admission patient daughter reported multiple urinary tract infection lately, history of DVT. EMS was called she was hypoxic to 70% per EMS.  In the ED patient was lethargic. Blood pressure was soft 86/66, with leukocytosis abnormal UA, lactic acidosis elevated creatinine patient was admitted.  Her hemoglobin had dropped to 7.1 g and was transfused 1 units on admission. Surgery was consulted for hematoma in the chest wall.  Patient placed on meropenem and admitted for UTI and severe sepsis. H&H dropped further assessment 6.6 g on 4/11 and additional 1 more unit given. Hemoglobin has improved since.  Patient is more alert awake oriented.  Subjective:  No new complaints She is appears much more alert awake oriented to self place people Conversant.  Assessment & Plan:  Suspected Severe Sepsis POA 2/2 recurrent UTI w/ leukocytosis, lactic acidosis, hypotension Right hydronephrosis with recurrent UTIS urine cx negative- off meropenem, now on rocephin completing dose. Awiating uro plan, possible cysto/stent removal/?longterm antibiotics.She has had cystoscopy/stent back in February.Leukocytosis has resolved, is afebrile, bp stable.UA on admission wbc >50, rbc >50, yeast +, non sp epith +, nitrite + and LE large.Blood culture no growth so far, She was previously seen by infectious disease-last office visit 2/28 by Dr. Juleen China.Foley catheter discontinued 4/11 monitor voiding. Recent Labs  Lab 01/26/21 1228 01/26/21 1610 01/26/21 2121 01/27/21 0254 01/28/21 0341 01/29/21 0611  01/30/21 0245  WBC 12.6*  --   --  17.8* 10.9* 8.5 6.2  LATICACIDVEN  --  2.4* 2.7*  --   --   --   --    Chest wall hematoma: In the setting of anticoagulation, 2/2 ?trauma. CT shows a large mass in the right pectoralis muscle well-circumscribed consistent with hematoma likely from local trauma.  Surgery following and no need for drainage at this time and will continue to hold Eliquis.  Surgery signed off. H&H has dropped needing 2 units PRBC so far.  Hemoglobin stable continue to monitor.If H&H continues to drop will needs to d/w surgery. Check H&H in the morning.  Patient will need to follow-up with her outpatient cardiology or PCP to resume Eliquis.  Acute metabolic encephalopathy in the setting of dementia with worsening mental status likely multifactorial with possible UTI and AKI.  She is much more alert awake and oriented.Continue PT OT.  Acute on chronic hypoxic resp failure, needing NRBon admission, needing 2 L nasal cannula at bedtime at home.Respiratory status is stable currently.  AKI, baseline creatinine 0.7, 01/24/2021, and 1.5 since admission-creatinine improved to 0.5.  Off IV fluids.  Encourage oral intake.   Recent Labs  Lab 01/26/21 1228 01/27/21 0254 01/28/21 0341 01/29/21 0611 01/30/21 0245  BUN 32* 38* 30* 19 14  CREATININE 1.55* 1.57* 1.23* 0.70 0.56   Chronic atrial fibrillation/history of DVT rate controll, anticoagulation on hold due to chest wall hematoma and blood loss anemia-she will need to follow-up with PCP or with her cardiologist to resume it once CBC stable on outpatient if family still wants to pursue anticoagulation,.  Acute blood loss anemia due to chest wall hematoma status post 2 units PRBC so far.  Monitor H&H  in the morning. Recent Labs  Lab 01/27/21 0254 01/28/21 0341 01/28/21 1109 01/29/21 0611 01/30/21 0245  HGB 8.7* 6.6* 8.4* 8.2* 8.7*  HCT 27.3* 21.0* 25.8* 25.4* 27.4*   DM2: Hemoglobin A1c 6.6 back in February 2022, blood sugar is  stable.  Monitor Recent Labs  Lab 01/30/21 1138 01/30/21 1633 01/30/21 2102 01/31/21 0653 01/31/21 1129  GLUCAP 111* 94 205* 206* 298*   Deconditioning/contractures PT OT evaluation may benefit with brace.  Supportive care.  ZOX:WRUEAVW code.palliative care has been consulted and had extensive discussion with daughter. Given her complex comorbidities, dementia AND  advanced age as prognosis appears to be guarded. Diet Order            Diet regular Room service appropriate? Yes; Fluid consistency: Thin  Diet effective now               Patient's Body mass index is 19.03 kg/m.  DVT prophylaxis: SCDs Start: 01/26/21 2141 Code Status:   Code Status: Partial Code  Family Communication: plan of care discussed with nursing staff, came to meet the daughter at the bedside not available.   Came back in evening and spoke with daughter at bedside spoke w/ Dr Milford Cage- Plan to remove stent Monday as OP. Monitor overnight , cbc in am and if stable d/c in am to home.  Status is: Inpatient Remains inpatient appropriate because:Inpatient level of care appropriate due to severity of illness Dispo: The patient is from: Home              Anticipated d/c is to: Home with return of home health services PT OT Aid and RN- already ordered face to face.  Anticipate discharge tomorrow if CBC remains stable.              Patient currently is not medically stable to d/c.   Difficult to place patient No Unresulted Labs (From admission, onward)          Start     Ordered   Unscheduled  Occult blood card to lab, stool  As needed,   R      01/28/21 0432        Medications reviewed:  Scheduled Meds: . sodium chloride   Intravenous Once  . atorvastatin  40 mg Oral Daily  . Chlorhexidine Gluconate Cloth  6 each Topical Daily  . donepezil  10 mg Oral QHS  . feeding supplement  237 mL Oral TID BM  . insulin aspart  0-5 Units Subcutaneous QHS  . insulin aspart  0-9 Units Subcutaneous TID WC  .  melatonin  3 mg Oral QHS  . multivitamin with minerals  1 tablet Oral Daily   Continuous Infusions:   Consultants:see note  Procedures:see note  Antimicrobials: Anti-infectives (From admission, onward)   Start     Dose/Rate Route Frequency Ordered Stop   01/28/21 1445  cefTRIAXone (ROCEPHIN) 1 g in sodium chloride 0.9 % 100 mL IVPB        1 g 200 mL/hr over 30 Minutes Intravenous Every 24 hours 01/28/21 1352 01/30/21 1900   01/26/21 1830  meropenem (MERREM) 500 mg in sodium chloride 0.9 % 100 mL IVPB  Status:  Discontinued        500 mg 200 mL/hr over 30 Minutes Intravenous Every 12 hours 01/26/21 1806 01/28/21 1352     Culture/Microbiology    Component Value Date/Time   SDES URINE, RANDOM 01/26/2021 1550   SPECREQUEST NONE 01/26/2021 1550   CULT (A) 01/26/2021 1550    <  10,000 COLONIES/mL INSIGNIFICANT GROWTH Performed at [facility], [place] [address], [place], [place] [address]    REPTSTATUS 01/27/2021 FINAL 01/26/2021 1550    Other culture-see note  Objective: Vitals: Today's Vitals   01/30/21 2102 01/31/21 0527 01/31/21 0934 01/31/21 1026  BP: (!) 158/62 (!) 149/75 (!) 151/72   Pulse: 80 72 71   Resp: 16 16 18    Temp: 98.2 F (36.8 C) 98.4 F (36.9 C) 98.2 F (36.8 C)   TempSrc:   Oral   SpO2: 96% 98% 99%   Weight:      PainSc:    0-No pain    Intake/Output Summary (Last 24 hours) at 01/31/2021 1210 Last data filed at 01/31/2021 0900 Gross per 24 hour  Intake 560 ml  Output 800 ml  Net -240 ml   Filed Weights   01/26/21 1237  Weight: 50.3 kg   Weight change:   Intake/Output from previous day: 04/13 0701 - 04/14 0700 In: 560 [P.O.:560] Out: 800 [Urine:800] Intake/Output this shift: Total I/O In: 120 [P.O.:120] Out: -  Filed Weights   01/26/21 1237  Weight: 50.3 kg   Examination: General exam:Alert,awake oriented to self,place,frail,thin. HEENT:Oral mucosa moist,Ear/Nose WNL grossly,dentition normal. Respiratory system:Bilaterally  clear,no wheezing or crackles,no use of accessory muscle. Cardiovascular system:S1 & S2 +,No JVD. Gastrointestinal system:Abdomen soft, thin,frail. Nervous System:Alert,awake,moving extremities and LUE contrature+ Extremities:No edema,contractures+,distal peripheral pulses palpable.  Skin:No rashes,no icterus. VEL:FYBOFBPZW,CHE tone,power.  Data Reviewed: I have personally reviewed following labs and imaging studies CBC: Recent Labs  Lab 01/24/21 1520 01/26/21 1228 01/27/21 0254 01/28/21 0341 01/28/21 1109 01/29/21 0611 01/30/21 0245  WBC 7.6 12.6* 17.8* 10.9*  --  8.5 6.2  NEUTROABS 5.6 10.2*  --   --   --   --   --   HGB 9.3* 7.1* 8.7* 6.6* 8.4* 8.2* 8.7*  HCT 31.1* 24.3* 27.3* 21.0* 25.8* 25.4* 27.4*  MCV 85.7 87.4 86.1 87.1  --  86.4 86.7  PLT 302 398 320 262  --  271 527   Basic Metabolic Panel: Recent Labs  Lab 01/26/21 1228 01/27/21 0254 01/28/21 0341 01/29/21 0611 01/30/21 0245  NA 139 135 138 135 136  K 4.5 4.6 4.1 3.7 3.5  CL 106 102 107 107 107  CO2 26 23 27 23 23   GLUCOSE 269* 193* 193* 134* 100*  BUN 32* 38* 30* 19 14  CREATININE 1.55* 1.57* 1.23* 0.70 0.56  CALCIUM 9.5 9.2 9.1 8.9 9.1   GFR: Estimated Creatinine Clearance: 37.9 mL/min (by C-G formula based on SCr of 0.56 mg/dL). Liver Function Tests: Recent Labs  Lab 01/24/21 1520 01/26/21 1228 01/27/21 0254  AST 21 16 15   ALT 15 11 11   ALKPHOS 47 43 45  BILITOT 0.9 0.5 1.0  PROT 7.0 6.0* 6.0*  ALBUMIN 2.8* 2.3* 2.5*   No results for input(s): LIPASE, AMYLASE in the last 168 hours. No results for input(s): AMMONIA in the last 168 hours. Coagulation Profile: Recent Labs  Lab 01/26/21 1228  INR 2.0*   Cardiac Enzymes: No results for input(s): CKTOTAL, CKMB, CKMBINDEX, TROPONINI in the last 168 hours. BNP (last 3 results) No results for input(s): PROBNP in the last 8760 hours. HbA1C: No results for input(s): HGBA1C in the last 72 hours. CBG: Recent Labs  Lab 01/30/21 1138  01/30/21 1633 01/30/21 2102 01/31/21 0653 01/31/21 1129  GLUCAP 111* 94 205* 206* 298*   Lipid Profile: No results for input(s): CHOL, HDL, LDLCALC, TRIG, CHOLHDL, LDLDIRECT in the last 72 hours.  Thyroid Function Tests: No results for input(s): TSH, T4TOTAL, FREET4, T3FREE, THYROIDAB in the last 72 hours. Anemia Panel: No results for input(s): VITAMINB12, FOLATE, FERRITIN, TIBC, IRON, RETICCTPCT in the last 72 hours. Sepsis Labs: Recent Labs  Lab 01/26/21 1610 01/26/21 2121  LATICACIDVEN 2.4* 2.7*    Recent Results (from the past 240 hour(s))  Urine culture     Status: Abnormal   Collection Time: 01/24/21  4:18 PM   Specimen: Urine, Random  Result Value Ref Range Status   Specimen Description   Final    URINE, RANDOM Performed at [facility] [address] [place], [place] [address]    Special Requests   Final    NONE Performed at [facility], [place] [address] [place], [place] [address]    Culture (A)  Final    <10,000 COLONIES/mL INSIGNIFICANT GROWTH Performed at [facility] [address], [place], [place] [address]    Report Status 01/26/2021 FINAL  Final  Resp Panel by RT-PCR (Flu A&B, Covid) Nasopharyngeal Swab     Status: None   Collection Time: 01/24/21  4:33 PM   Specimen: Nasopharyngeal Swab; Nasopharyngeal(NP) swabs in vial transport medium  Result Value Ref Range Status   SARS Coronavirus 2 by RT PCR NEGATIVE NEGATIVE Final    Comment: (NOTE) SARS-CoV-2 target nucleic acids are NOT DETECTED.  The SARS-CoV-2 RNA is generally detectable in upper respiratory specimens during the acute phase of infection. The lowest concentration of SARS-CoV-2 viral copies this assay can detect is 138 copies/mL. A negative result does not preclude SARS-Cov-2 infection and should not be used as the sole basis for treatment or other patient management decisions. A negative result may occur with  improper specimen  collection/handling, submission of specimen other than nasopharyngeal swab, presence of viral mutation(s) within the areas targeted by this assay, and inadequate number of viral copies(<138 copies/mL). A negative result must be combined with clinical observations, patient history, and epidemiological information. The expected result is Negative.  Fact Sheet for Patients:  EntrepreneurPulse.com.au  Fact Sheet for Healthcare Providers:  IncredibleEmployment.be  This test is no t yet approved or cleared by the [place] FDA and  has been authorized for detection and/or diagnosis of SARS-CoV-2 by FDA under an Emergency Use Authorization (EUA). This EUA will remain  in effect (meaning this test can be used) for the duration of the COVID-19 declaration under Section 564(b)(1) of the Act, 21 U.S.C.section 360bbb-3(b)(1), unless the authorization is terminated  or revoked sooner.       Influenza A by PCR NEGATIVE NEGATIVE Final   Influenza B by PCR NEGATIVE NEGATIVE Final    Comment: (NOTE) The Xpert Xpress SARS-CoV-2/FLU/RSV plus assay is intended as an aid in the diagnosis of influenza from Nasopharyngeal swab specimens and should not be used as a sole basis for treatment. Nasal washings and aspirates are unacceptable for Xpert Xpress SARS-CoV-2/FLU/RSV testing.  Fact Sheet for Patients: EntrepreneurPulse.com.au  Fact Sheet for Healthcare Providers: IncredibleEmployment.be  This test is not yet approved or cleared by the [place] FDA and has been authorized for detection and/or diagnosis of SARS-CoV-2 by FDA under an Emergency Use Authorization (EUA). This EUA will remain in effect (meaning this test can be used) for the duration of the COVID-19 declaration under Section 564(b)(1) of the Act, 21 U.S.C. section 360bbb-3(b)(1), unless the authorization is terminated or revoked.  Performed at [facility], [place] [address] [place], [place] [address]  Blood culture (routine x 2)     Status: None   Collection Time: 01/26/21 12:44 PM   Specimen: BLOOD  Result Value Ref Range Status   Specimen Description BLOOD SITE NOT SPECIFIED  Final   Special Requests   Final    BOTTLES DRAWN AEROBIC AND ANAEROBIC Blood Culture results may not be optimal due to an inadequate volume of blood received in culture bottles   Culture   Final    NO GROWTH 5 DAYS Performed at [facility], [address] [place], [place] [address]    Report Status 01/31/2021 FINAL  Final  Resp Panel by RT-PCR (Flu A&B, Covid) Nasopharyngeal Swab     Status: None   Collection Time: 01/26/21  1:00 PM   Specimen: Nasopharyngeal Swab; Nasopharyngeal(NP) swabs in vial transport medium  Result Value Ref Range Status   SARS Coronavirus 2 by RT PCR NEGATIVE NEGATIVE Final    Comment: (NOTE) SARS-CoV-2 target nucleic acids are NOT DETECTED.  The SARS-CoV-2 RNA is generally detectable in upper respiratory specimens during the acute phase of infection. The lowest concentration of SARS-CoV-2 viral copies this assay can detect is 138 copies/mL. A negative result does not preclude SARS-Cov-2 infection and should not be used as the sole basis for treatment or other patient management decisions. A negative result may occur with  improper specimen collection/handling, submission of specimen other than nasopharyngeal swab, presence of viral mutation(s) within the areas targeted by this assay, and inadequate number of viral copies(<138 copies/mL). A negative result must be combined with clinical observations, patient history, and epidemiological information. The expected result is Negative.  Fact Sheet for Patients:  EntrepreneurPulse.com.au  Fact Sheet for Healthcare Providers:  IncredibleEmployment.be  This test is no t yet approved or cleared by the [place] FDA  and  has been authorized for detection and/or diagnosis of SARS-CoV-2 by FDA under an Emergency Use Authorization (EUA). This EUA will remain  in effect (meaning this test can be used) for the duration of the COVID-19 declaration under Section 564(b)(1) of the Act, 21 U.S.C.section 360bbb-3(b)(1), unless the authorization is terminated  or revoked sooner.       Influenza A by PCR NEGATIVE NEGATIVE Final   Influenza B by PCR NEGATIVE NEGATIVE Final    Comment: (NOTE) The Xpert Xpress SARS-CoV-2/FLU/RSV plus assay is intended as an aid in the diagnosis of influenza from Nasopharyngeal swab specimens and should not be used as a sole basis for treatment. Nasal washings and aspirates are unacceptable for Xpert Xpress SARS-CoV-2/FLU/RSV testing.  Fact Sheet for Patients: EntrepreneurPulse.com.au  Fact Sheet for Healthcare Providers: IncredibleEmployment.be  This test is not yet approved or cleared by the [place] FDA and has been authorized for detection and/or diagnosis of SARS-CoV-2 by FDA under an Emergency Use Authorization (EUA). This EUA will remain in effect (meaning this test can be used) for the duration of the COVID-19 declaration under Section 564(b)(1) of the Act, 21 U.S.C. section 360bbb-3(b)(1), unless the authorization is terminated or revoked.  Performed at [facility], [place] [address], [place],  [address]   Blood culture (routine x 2)     Status: None   Collection Time: 01/26/21  1:30 PM   Specimen: BLOOD  Result Value Ref Range Status   Specimen Description BLOOD SITE NOT SPECIFIED  Final   Special Requests   Final    BOTTLES DRAWN AEROBIC AND ANAEROBIC Blood Culture results may not be optimal due to an inadequate volume of  blood received in culture bottles   Culture   Final    NO GROWTH 5 DAYS Performed at [facility], [place] [address], [place], [place] [address]    Report Status 01/31/2021 FINAL   Final  Urine Culture     Status: Abnormal   Collection Time: 01/26/21  3:50 PM   Specimen: Urine, Random  Result Value Ref Range Status   Specimen Description URINE, RANDOM  Final   Special Requests NONE  Final   Culture (A)  Final    <10,000 COLONIES/mL INSIGNIFICANT GROWTH Performed at [facility], [place] [address] [place], [place] [address]    Report Status 01/27/2021 FINAL  Final     Radiology Studies: No results found.   LOS: 5 days   Antonieta Pert, MD [facility]  01/31/2021, 12:10 PM

## 2021-01-31 NOTE — Progress Notes (Signed)
Calorie Count Note  48 hour calorie count ordered.  Diet: regular Supplements: Ensure Enlive TID, Magic Cup TID  Day 1 Results: Breakfast: 136 kcals, 7g protein Lunch: 445 kcals, 21g protein Dinner: 335 kcals, 18g protein Supplements/Snacks: 423 kcals, 20g protein  Total intake: 1339 kcal (89% of minimum estimated needs)  66g protein (88% of minimum estimated needs)  Estimated Nutritional Needs:  Kcal:  1500-1700 Protein:  75-85 grams Fluid:  >1.5L/d  Nutrition Dx: Severe Malnutrition related to chronic illness (dementia) as evidenced by severe fat depletion,severe muscle depletion.  Goal: Patient will meet greater than or equal to 90% of their needs  Intervention:  -continue calorie count per MD -continue Ensure Enlive po TID, each supplement provides 350 kcal and 20 grams of protein -continue Magic cup TID with meals, each supplement provides 290 kcal and 9 grams of protein -continue mvi with minerals daily   Larkin Ina, MS, RD, LDN RD pager number and weekend/on-call pager number located in [place].

## 2021-01-31 NOTE — Progress Notes (Addendum)
Occupational Therapy Treatment Patient Details Name: Rebecca Pruitt MRN: [id number] DOB: 09/06/1929 Today's Date: 01/31/2021    History of present illness Rebecca Pruitt is an 87 y/o female admitted on 01/26/21 due to UTI. CT found right chest hematoma. Pt recently d/c from [place] on 01/27/21. PMH includes history of recurrent UTIs, CVA, HTN, CAD, dementia, chronic A-fib, history of DVTs, and HLD.   OT comments  Pt seen this AM for application and assessment of tolerance to L resting hand splint to prevent progression of hand/wrist contractures. With increased time and OT modification of splint, able to don to L UE appropriately. Educated pt on purpose of splint and plan to check back in 4 hours to assess tolerance. Pt with moaning of generalized pain during session, inconsistent with L hand ROM. Assisted in readjusting pt for comfort with her reporting desire for toast and coffee this AM (NT notified). Collaborated with NT/RN on plan for resting hand splint trial today and plan to check splint tolerance in 4 hours. Will follow-up.    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 (hears better in left ear), dementia at baseline, right chest hematoma, right leg hematoma Required Braces or Orthoses: Other Brace Other Brace: L resting hand splint Restrictions Weight Bearing Restrictions: No       Mobility Bed Mobility Overal bed mobility: Needs Assistance             General bed mobility comments: Total A for adjusting in bed for breakfast, tendency for L lateral lean    Transfers                 General transfer comment: unable     Balance                                           ADL either performed or assessed with clinical judgement   ADL Overall ADL's : Needs assistance/impaired                                       General ADL Comments: Session focused on assessment of resting hand splint and modifications to assess tolerance, trial     Vision   Vision Assessment?: Vision impaired- to be further tested in functional context Additional Comments: legally blind   Perception     Praxis      Cognition Arousal/Alertness: Awake/alert Behavior During Therapy: Flat affect Overall Cognitive Status: History of cognitive impairments - at baseline Area of Impairment: Orientation;Attention;Memory;Following commands;Safety/judgement;Awareness;Problem solving                 Orientation Level: Disoriented to;Place;Time;Situation Current Attention Level: Sustained Memory: Decreased short-term memory Following Commands: Follows one step commands inconsistently;Follows one step commands with increased time Safety/Judgement: Decreased awareness of safety;Decreased awareness of deficits Awareness: Intellectual Problem Solving: Slow processing;Decreased initiation;Requires verbal cues;Requires tactile cues;Difficulty sequencing General Comments: Pt responding to questions with increased time and occassional tactile stimulation. Answering questions appropriate. Needed increased verbal and tactile  cueing to stay on task        Exercises Exercises: Other exercises Other Exercises Other Exercises: PROM of L UE, focusing on wrist/digits for placement of splint   Shoulder Instructions       General Comments cannula out of nose on entry, OT assisted in readjusting x 2 during session but difficulty keeping in place. Notified NT/RN on trial of resting hand splint to L UE    Pertinent Vitals/ Pain       Pain Assessment: Faces Faces Pain Scale: Hurts little more Pain Location:  L hand with ROM, other moaning with generalized pain. pt unable to specify where this pain is located but later reported "under my arm" Pain Descriptors / Indicators: Grimacing;Guarding;Moaning Pain Intervention(s): Monitored during session;Limited activity within patient's tolerance;Repositioned  Home Living                                          Prior Functioning/Environment              Frequency  Min 2X/week        Progress Toward Goals  OT Goals(current goals can now be found in the care plan section)  Progress towards OT goals: Progressing toward goals  Acute Rehab OT Goals Patient Stated Goal: eat toast and have coffee, listen to tv OT Goal Formulation: Patient unable to participate in goal setting Time For Goal Achievement: 02/13/21 Potential to Achieve Goals: Fair ADL Goals Additional ADL Goal #1: Pt to tolerate L UE splint wear for 4-6 hours with no signs of skin breakdown Additional ADL Goal #2: Pt to demonstrate rolling side to side in bed with Mod A to ease caregiver burden for ADLs Additional ADL Goal #3: Caregivers to independently demonstrate optimal safety techniques for completion of ADLs to prevent injury Additional ADL Goal #4: Pt to be A&Ox2  Plan Discharge plan remains appropriate    Co-evaluation                 AM-PAC OT "6 Clicks" Daily Activity     Outcome Measure   Help from another person eating meals?: Total Help from another person taking care of personal grooming?: Total Help from another person toileting, which includes using toliet, bedpan, or urinal?: Total Help from another person bathing (including washing, rinsing, drying)?: Total Help from another person to put on and taking off regular upper body clothing?: Total Help from another person to put on and taking off regular lower body clothing?: Total 6 Click Score: 6    End of Session Equipment Utilized During Treatment: Oxygen  OT Visit Diagnosis:  Muscle weakness (generalized) (M62.81);Pain;Other symptoms and signs involving cognitive function Pain - Right/Left: Left Pain - part of body: Hand   Activity Tolerance Patient tolerated treatment well   Patient Left in bed;with call bell/phone within reach;with bed alarm set   Nurse Communication Other (comment) (trial of resting hand splint)        Time: 0175-1025 OT Time Calculation (min): 13 min  Charges: OT General Charges $OT Visit: 1 Visit OT Treatments $Orthotics Fit/Training: 8-22 mins  Malachy Chamber, OTR/L Acute Rehab Services Office: [phone]   Layla Maw 01/31/2021, 8:13 AM

## 2021-01-31 NOTE — Progress Notes (Addendum)
Occupational Therapy Treatment Patient Details Name: Rebecca Pruitt MRN: [id number] DOB: 04-21-1929 Today's Date: 01/31/2021    History of present illness Rebecca Pruitt is an 87 y/o female admitted on 01/26/21 due to UTI. CT found right chest hematoma. Pt recently d/c from [place] on 01/27/21. PMH includes history of recurrent UTIs, CVA, HTN, CAD, dementia, chronic A-fib, history of DVTs, and HLD.   OT comments  OT entering at end of PT session for splint check where assistance was needed to change saturated linens. Pt able to sit unsupported EOB during linen change and initiate return to supine at Max A. In collab with PT, pt repositioned for comfort. Pt's L resting hand splint still intact at 4 hour mark but had rotated some where digits were not fully in place - this may be due to pt having active voluntary movement of L UE and fidgety at times. Per RN, around 3 hour mark, splint still in proper place. Assessed skin with no redness or indentations noted (2 bandages were already in place on top of forearm). Plan to further assess wearing tolerance tomorrow for 4-6 hour timeframe, educate staff and family on optimal splinting schedule, and update orders accordingly.    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 able to initiate this movement    Transfers                 General transfer comment: unable    Balance Overall balance assessment: Needs assistance Sitting-balance support: Feet supported;Bilateral upper extremity supported;No upper extremity supported Sitting balance-Leahy Scale: Fair                                     ADL either performed or assessed with clinical judgement   ADL Overall ADL's : Needs assistance/impaired                                       General ADL Comments: Session focused on assessment of resting hand splint and modifications to assess tolerance. Entering during PT session and assisted with completion of linen change, bed mobility due to saturated linens     Vision   Vision Assessment?: Vision impaired- to be further tested in functional context Additional Comments: legally blind   Perception     Praxis      Cognition Arousal/Alertness: Awake/alert Behavior During Therapy: Flat affect Overall Cognitive Status: History of cognitive impairments - at baseline Area of Impairment: Orientation;Attention;Memory;Following commands;Safety/judgement;Awareness;Problem solving                 Orientation Level:  Disoriented to;Place;Time;Situation Current Attention Level: Sustained Memory: Decreased short-term memory Following Commands: Follows one step commands inconsistently;Follows one step commands with increased time Safety/Judgement: Decreased awareness of safety;Decreased awareness of deficits Awareness: Intellectual Problem Solving: Slow processing;Decreased initiation;Requires verbal cues;Requires tactile cues;Difficulty sequencing General Comments: Pt responding to questions with increased time and occassional tactile stimulation. Answering questions appropriately. Increased conversation, smiling with repositioning        Exercises      Shoulder Instructions       General Comments Entering during PT session with pt EOB, assisted with linen change of saturated linens and bed mobility to reposition pt. Pt noted with redness around peri area and reports of pain with purewick placement attempt. left purewick out and notified nursing. Repositioned pt with L UE on pillow and pillow at L side to encourage upright posturing.. Pt does not keep L UE on pillow, actively moves more into flexion position. Resting hand splint not fully in place around digits at check on 4 hr mark. RN checked at 3 hour mark with splint still in place    Pertinent Vitals/ Pain       Pain Assessment: Faces Faces Pain Scale: Hurts even more Pain Location: R side with bed mobility, L UE with ROM Pain Descriptors / Indicators: Grimacing;Guarding;Moaning Pain Intervention(s): Monitored during session;Repositioned  Home Living                                          Prior Functioning/Environment              Frequency  Min 2X/week        Progress Toward Goals  OT Goals(current goals can now be found in the care plan section)  Progress towards OT goals: Progressing toward goals  Acute Rehab OT Goals Patient Stated Goal: be comfortable in bed OT Goal Formulation: Patient unable to participate in goal setting Time For Goal Achievement: 02/13/21 Potential to Achieve Goals: Fair ADL Goals Additional ADL Goal #1: Pt to tolerate L UE splint wear for 4-6 hours with no signs of skin breakdown Additional ADL Goal #2: Pt to demonstrate rolling side to side in bed with Mod A to ease caregiver burden for ADLs Additional ADL Goal #3: Caregivers to independently demonstrate optimal safety techniques for completion of ADLs to prevent injury Additional ADL Goal #4: Pt to be A&Ox2  Plan Discharge plan remains appropriate    Co-evaluation                 AM-PAC OT "6 Clicks" Daily Activity     Outcome Measure   Help from  another person eating meals?: Total Help from another person taking care of personal grooming?: Total Help from another person toileting, which includes using toliet, bedpan, or urinal?: Total Help from another person bathing (including washing, rinsing, drying)?: Total Help from another person to put on and taking off regular upper body clothing?: Total Help from another person to put on and taking off regular lower body clothing?: Total 6 Click Score: 6    End of Session Equipment Utilized During Treatment: Oxygen  OT Visit Diagnosis: Muscle weakness (generalized) (M62.81);Pain;Other symptoms and signs involving cognitive function Pain - Right/Left: Left Pain - part of body: Hand   Activity Tolerance Patient tolerated treatment well;Patient limited by pain   Patient Left in bed;with call bell/phone within reach;with bed alarm set   Nurse Communication  Other (comment) (trial of resting hand splint, linen change, skin integrity of peri area)        Time: 4360-1658 OT Time Calculation (min): 12 min  Charges: OT General Charges $OT Visit: 1 Visit OT Treatments $Therapeutic Activity: 8-22 mins  Malachy Chamber, OTR/L Acute Rehab Services Office: [phone]   Layla Maw 01/31/2021, 12:16 PM

## 2021-02-01 ENCOUNTER — Inpatient Hospital Stay (HOSPITAL_COMMUNITY): Payer: Medicare Other

## 2021-02-01 DIAGNOSIS — I82409 Acute embolism and thrombosis of unspecified deep veins of unspecified lower extremity: Secondary | ICD-10-CM | POA: Diagnosis not present

## 2021-02-01 DIAGNOSIS — I482 Chronic atrial fibrillation, unspecified: Secondary | ICD-10-CM | POA: Diagnosis not present

## 2021-02-01 LAB — CBC
HCT: 30.4 % — ABNORMAL LOW (ref 36.0–46.0)
Hemoglobin: 9.6 g/dL — ABNORMAL LOW (ref 12.0–15.0)
MCH: 28.1 pg (ref 26.0–34.0)
MCHC: 31.6 g/dL (ref 30.0–36.0)
MCV: 88.9 fL (ref 80.0–100.0)
Platelets: 375 10*3/uL (ref 150–400)
RBC: 3.42 MIL/uL — ABNORMAL LOW (ref 3.87–5.11)
RDW: 17 % — ABNORMAL HIGH (ref 11.5–15.5)
WBC: 10.5 10*3/uL (ref 4.0–10.5)
nRBC: 0 % (ref 0.0–0.2)

## 2021-02-01 LAB — TSH: TSH: 0.99 u[IU]/mL (ref 0.350–4.500)

## 2021-02-01 LAB — GLUCOSE, CAPILLARY
Glucose-Capillary: 174 mg/dL — ABNORMAL HIGH (ref 70–99)
Glucose-Capillary: 122 mg/dL — ABNORMAL HIGH (ref 70–99)
Glucose-Capillary: 160 mg/dL — ABNORMAL HIGH (ref 70–99)
Glucose-Capillary: 276 mg/dL — ABNORMAL HIGH (ref 70–99)

## 2021-02-01 MED ORDER — APIXABAN 5 MG PO TABS
5.0000 mg | ORAL_TABLET | Freq: Two times a day (BID) | ORAL | Status: DC
  Administered 2021-02-01 – 2021-02-02 (×2): 5 mg via ORAL
  Filled 2021-02-01 (×2): qty 1

## 2021-02-01 NOTE — Progress Notes (Signed)
PROGRESS NOTE    [name]  DUK:[id number] DOB: 12/04/1928 DOA: 01/26/2021 PCP: Vicenta Aly, FNP   Chief Complaint  Patient presents with  . Altered Mental Status  Brief Narrative: 87 year old female is seen within medical history of dementia, diabetes, coronary artery disease, hypertension, CVA on Eliquis brought due to weakness and right chest wall swelling as well as  altered mental status. On admission patient daughter reported multiple urinary tract infection lately, history of DVT. EMS was called she was hypoxic to 70% per EMS.  In the ED patient was lethargic. Blood pressure was soft 86/66, with leukocytosis abnormal UA, lactic acidosis elevated creatinine patient was admitted.  Her hemoglobin had dropped to 7.1 g and was transfused 1 units on admission. Surgery was consulted for hematoma in the chest wall.  Patient placed on meropenem and admitted for UTI and severe sepsis. H&H dropped further assessment 6.6 g on 4/11 and additional 1 more unit given. Hemoglobin has improved since.  Patient is more alert awake oriented.  Subjective: Seen this morning. Being fed by nursing Mood has improved. NO NEW COMPLAINTS  Assessment & Plan:  Suspected Severe Sepsis POA 2/2 recurrent UTI w/ leukocytosis, lactic acidosis, hypotension Right hydronephrosis with recurrent UTIS urine cx negative- off meropenem >rocephin completed 4/14.  Discussed with Dr. Milford Cage from urology he plans to remove the stent in the office likely Monday.She has had cystoscopy/stent back in February.Leukocytosis has resolved, is afebrile, bp stable.UA on admission wbc >50, rbc >50, yeast +, non sp epith +, nitrite + and LE large.Blood culture no growth so far, She was previously seen by infectious disease-last office visit 2/28 by Dr. Juleen China.Foley catheter discontinued 4/11 and now voiding. Recent Labs  Lab 01/26/21 1610 01/26/21 2121 01/27/21 0254 01/28/21 0341 01/29/21 0611 01/30/21 0245  02/01/21 0302  WBC  --   --  17.8* 10.9* 8.5 6.2 10.5  LATICACIDVEN 2.4* 2.7*  --   --   --   --   --    Chest wall hematoma traumatic from lifting in the setting of anticoagulation:CT shows a large mass in the right pectoralis muscle well-circumscribed consistent with hematoma, also has hematoma to right lower extremity.Patient needed 2 units PRBC transfusion so far.  Hemoglobin has improved.  Eliquis was held due to surgery recommendation.Since her DVT is within a month and still has DVT on right lower extremity and history of A. Fib/stroke-patient is high risk.I discussed with on-call surgeon Dr. Reather Laurence today-given her high risk she recommends to restart Eliquis today and trend CBC tomorrow to make sure it is stable for her to discharge.If she has recurrent issues with bleeding the need to weigh in risks/benefits if she has recurrent issues.  Acute blood loss anemia as low as 6.6 gm- needing 2 unit PRBC hemoglobin is stable now. Recent Labs  Lab 01/28/21 0341 01/28/21 1109 01/29/21 0611 01/30/21 0245 02/01/21 0302  HGB 6.6* 8.4* 8.2* 8.7* 9.6*  HCT 21.0* 25.8* 25.4* 27.4* 37.6*   Acute metabolic encephalopathy in the setting of dementia with worsening mental status likely multifactorial with possible UTI and AKI.  She is much more alert awake and oriented.Continue PT OT.  Acute on chronic hypoxic resp failure, needing NRB on admission, needing 2 L nasal cannula at bedtime at home.currently stable..  AKI, it has resolved w/ ivf. Cont po Recent Labs  Lab 01/26/21 1228 01/27/21 0254 01/28/21 0341 01/29/21 0611 01/30/21 0245  BUN 32* 38* 30* 19 14  CREATININE 1.55* 1.57* 1.23* 0.70 0.56  Chronic atrial fibrillation/ Hx of Stroke rate controled.Patient has previously on aspirin and Plavix and beginning of March she was switched to Eliquis 2.5 mg while admitted.   Recent right lower extremity DVT: Duplex on 01/04/2021 at [facility] showed nonocclusive thrombus in the right femoral  vein and popliteal vein-her PCP had increased her 2.5 mg Eliquis to 10 mg twice daily subsequently was getting 5 mg after compelting loading dose. Eliquis now held due to anemia,hematoma. Venous Duplex today 4/15 shows age indeterminate DVT right femoral vein right posterior tibial vein and tibioperoneal trunk.  After discussing with surgery and as well as patient's daughter resuming Eliquis tonight   DM2: Hemoglobin A1c 6.6 back in February 2022, blood sugar is stable.  Monitor Recent Labs  Lab 01/31/21 1129 01/31/21 1630 01/31/21 2108 02/01/21 0656 02/01/21 1145  GLUCAP 298* 250* 259* 174* 122*   Deconditioning/contractures PT OT to continue, as needed brace.    AXK:PVVZSMO code.palliative care has been consulted and had extensive discussion with daughter. Given her complex comorbidities, dementia AND  advanced age as prognosis appears to be guarded.  Daughter wanting to talk with palliative care again after initial visit. I am wondering if patient is appropriate for home with hospice. Diet Order            Diet Carb Modified Fluid consistency: Thin; Room service appropriate? Yes  Diet effective now               Patient's Body mass index is 19.03 kg/m.  DVT prophylaxis: SCDs Start: 01/26/21 2141 Code Status:   Code Status: Partial Code  Family Communication: Plan of care discussed with patient's daughter over the phone.   Daughter expecting the patient to celebrate her 48th birthday which is on Sunday hoping for discharge tomorrow Daughter understands overall guarded prognosis  Status is: Inpatient Remains inpatient appropriate because:Inpatient level of care appropriate due to severity of illness Dispo: The patient is from: Home              Anticipated d/c is to: Home with return of home health services PT OT Aid and RN- already ordered face to face. Awaiting palliative eval.               Patient currently is not medically stable to d/c.   Difficult to place patient  No Unresulted Labs (From admission, onward)          Start     Ordered   Unscheduled  Occult blood card to lab, stool  As needed,   R      04 /11/22 0432        Medications reviewed:  Scheduled Meds: . sodium chloride   Intravenous Once  . atorvastatin  40 mg Oral Daily  . Chlorhexidine Gluconate Cloth  6 each Topical Daily  . donepezil  10 mg Oral QHS  . feeding supplement  237 mL Oral TID BM  . insulin aspart  0-5 Units Subcutaneous QHS  . insulin aspart  0-9 Units Subcutaneous TID WC  . melatonin  3 mg Oral QHS  . multivitamin with minerals  1 tablet Oral Daily   Continuous Infusions:   Consultants:see note  Procedures:see note  Antimicrobials: Anti-infectives (From admission, onward)   Start     Dose/Rate Route Frequency Ordered Stop   01/28/21 1445  cefTRIAXone (ROCEPHIN) 1 g in sodium chloride 0.9 % 100 mL IVPB        1 g 200 mL/hr over 30 Minutes Intravenous Every 24 hours 01/28/21  1352 01/30/21 1900   01/26/21 1830  meropenem (MERREM) 500 mg in sodium chloride 0.9 % 100 mL IVPB  Status:  Discontinued        500 mg 200 mL/hr over 30 Minutes Intravenous Every 12 hours 01/26/21 1806 01/28/21 1352     Culture/Microbiology    Component Value Date/Time   SDES URINE, RANDOM 01/26/2021 1550   SPECREQUEST NONE 01/26/2021 1550   CULT (A) 01/26/2021 1550    <10,000 COLONIES/mL INSIGNIFICANT GROWTH Performed at [facility] [address], [place], [place] [address]    REPTSTATUS 01/27/2021 FINAL 01/26/2021 1550    Other culture-see note  Objective: Vitals: Today's Vitals   01/31/21 2000 01/31/21 2034 02/01/21 0423 02/01/21 0948  BP:  (!) 153/81 103/62 (!) 110/49  Pulse:  86 (!) 55 71  Resp:  18 18 18   Temp:  (!) 97.4 F (36.3 C) (!) 97.3 F (36.3 C) 97.8 F (36.6 C)  TempSrc:  Axillary    SpO2:  98% 99% 99%  Weight:      PainSc: 0-No pain       Intake/Output Summary (Last 24 hours) at 02/01/2021 1345 Last data filed at 02/01/2021 1300 Gross per  24 hour  Intake 120 ml  Output 200 ml  Net -80 ml   Filed Weights   01/26/21 1237  Weight: 50.3 kg   Weight change:   Intake/Output from previous day: 04/14 0701 - 04/15 0700 In: 440 [P.O.:440] Out: 327 [Urine:327] Intake/Output this shift: Total I/O In: 120 [P.O.:120] Out: 200 [Urine:200] Filed Weights   01/26/21 1237  Weight: 50.3 kg   Examination: General exam: mildly lethargic frail, elderly, debilitated on nasal cannula  HEENT:Oral mucosa moist, Ear/Nose WNL grossly, dentition normal. Respiratory system: bilaterally clear,no wheezing or crackles,no use of accessory muscle right.  Chest wall with hematoma appears somewhat softer from prior. Cardiovascular system: S1 & S2 +, No JVD,. Gastrointestinal system: Abdomen soft, NT,ND, BS+ Nervous System:Alert, awake, moving extremities and grossly nonfocal Extremities: No edema, distal peripheral pulses palpable.  Skin: No rashes,no icterus. MSK: Normal muscle bulk,tone, power  Data Reviewed: I have personally reviewed following labs and imaging studies CBC: Recent Labs  Lab 01/26/21 1228 01/27/21 0254 01/28/21 0341 01/28/21 1109 01/29/21 0611 01/30/21 0245 02/01/21 0302  WBC 12.6* 17.8* 10.9*  --  8.5 6.2 10.5  NEUTROABS 10.2*  --   --   --   --   --   --   HGB 7.1* 8.7* 6.6* 8.4* 8.2* 8.7* 9.6*  HCT 24.3* 27.3* 21.0* 25.8* 25.4* 27.4* 30.4*  MCV 87.4 86.1 87.1  --  86.4 86.7 88.9  PLT 398 320 262  --  271 290 950   Basic Metabolic Panel: Recent Labs  Lab 01/26/21 1228 01/27/21 0254 01/28/21 0341 01/29/21 0611 01/30/21 0245  NA 139 135 138 135 136  K 4.5 4.6 4.1 3.7 3.5  CL 106 102 107 107 107  CO2 26 23 27 23 23   GLUCOSE 269* 193* 193* 134* 100*  BUN 32* 38* 30* 19 14  CREATININE 1.55* 1.57* 1.23* 0.70 0.56  CALCIUM 9.5 9.2 9.1 8.9 9.1   GFR: Estimated Creatinine Clearance: 37.9 mL/min (by C-G formula based on SCr of 0.56 mg/dL). Liver Function Tests: Recent Labs  Lab 01/26/21 1228  01/27/21 0254  AST 16 15  ALT 11 11  ALKPHOS 43 45  BILITOT 0.5 1.0  PROT 6.0* 6.0*  ALBUMIN 2.3* 2.5*   No results for input(s): LIPASE, AMYLASE in the  last 168 hours. No results for input(s): AMMONIA in the last 168 hours. Coagulation Profile: Recent Labs  Lab 01/26/21 1228  INR 2.0*   Cardiac Enzymes: No results for input(s): CKTOTAL, CKMB, CKMBINDEX, TROPONINI in the last 168 hours. BNP (last 3 results) No results for input(s): PROBNP in the last 8760 hours. HbA1C: No results for input(s): HGBA1C in the last 72 hours. CBG: Recent Labs  Lab 01/31/21 1129 01/31/21 1630 01/31/21 2108 02/01/21 0656 02/01/21 1145  GLUCAP 298* 250* 259* 174* 122*   Lipid Profile: No results for input(s): CHOL, HDL, LDLCALC, TRIG, CHOLHDL, LDLDIRECT in the last 72 hours. Thyroid Function Tests: No results for input(s): TSH, T4TOTAL, FREET4, T3FREE, THYROIDAB in the last 72 hours. Anemia Panel: No results for input(s): VITAMINB12, FOLATE, FERRITIN, TIBC, IRON, RETICCTPCT in the last 72 hours. Sepsis Labs: Recent Labs  Lab 01/26/21 1610 01/26/21 2121  LATICACIDVEN 2.4* 2.7*    Recent Results (from the past 240 hour(s))  Urine culture     Status: Abnormal   Collection Time: 01/24/21  4:18 PM   Specimen: Urine, Random  Result Value Ref Range Status   Specimen Description   Final    URINE, RANDOM Performed at [facility] [address], [place], [place] [address]    Special Requests   Final    NONE Performed at [facility], [place] [address], [place], [place] [address]    Culture (A)  Final    <10,000 COLONIES/mL INSIGNIFICANT GROWTH Performed at [facility] [address], [place], [place] [address]    Report Status 01/26/2021 FINAL  Final  Resp Panel by RT-PCR (Flu A&B, Covid) Nasopharyngeal Swab     Status: None   Collection Time: 01/24/21  4:33 PM   Specimen: Nasopharyngeal Swab; Nasopharyngeal(NP) swabs in vial transport  medium  Result Value Ref Range Status   SARS Coronavirus 2 by RT PCR NEGATIVE NEGATIVE Final    Comment: (NOTE) SARS-CoV-2 target nucleic acids are NOT DETECTED.  The SARS-CoV-2 RNA is generally detectable in upper respiratory specimens during the acute phase of infection. The lowest concentration of SARS-CoV-2 viral copies this assay can detect is 138 copies/mL. A negative result does not preclude SARS-Cov-2 infection and should not be used as the sole basis for treatment or other patient management decisions. A negative result may occur with  improper specimen collection/handling, submission of specimen other than nasopharyngeal swab, presence of viral mutation(s) within the areas targeted by this assay, and inadequate number of viral copies(<138 copies/mL). A negative result must be combined with clinical observations, patient history, and epidemiological information. The expected result is Negative.  Fact Sheet for Patients:  EntrepreneurPulse.com.au  Fact Sheet for Healthcare Providers:  IncredibleEmployment.be  This test is no t yet approved or cleared by the [place] FDA and  has been authorized for detection and/or diagnosis of SARS-CoV-2 by FDA under an Emergency Use Authorization (EUA). This EUA will remain  in effect (meaning this test can be used) for the duration of the COVID-19 declaration under Section 564(b)(1) of the Act, 21 U.S.C.section 360bbb-3(b)(1), unless the authorization is terminated  or revoked sooner.       Influenza A by PCR NEGATIVE NEGATIVE Final   Influenza B by PCR NEGATIVE NEGATIVE Final    Comment: (NOTE) The Xpert Xpress SARS-CoV-2/FLU/RSV plus assay is intended as an aid in the diagnosis of influenza from Nasopharyngeal swab specimens and should not be used as a sole basis for treatment. Nasal washings  and aspirates are unacceptable for Xpert Xpress SARS-CoV-2/FLU/RSV testing.  Fact Sheet for  Patients: EntrepreneurPulse.com.au  Fact Sheet for Healthcare Providers: IncredibleEmployment.be  This test is not yet approved or cleared by the [place] FDA and has been authorized for detection and/or diagnosis of SARS-CoV-2 by FDA under an Emergency Use Authorization (EUA). This EUA will remain in effect (meaning this test can be used) for the duration of the COVID-19 declaration under Section 564(b)(1) of the Act, 21 U.S.C. section 360bbb-3(b)(1), unless the authorization is terminated or revoked.  Performed at [facility], [place] [address] [place], [place] [address]   Blood culture (routine x 2)     Status: None   Collection Time: 01/26/21 12:44 PM   Specimen: BLOOD  Result Value Ref Range Status   Specimen Description BLOOD SITE NOT SPECIFIED  Final   Special Requests   Final    BOTTLES DRAWN AEROBIC AND ANAEROBIC Blood Culture results may not be optimal due to an inadequate volume of blood received in culture bottles   Culture   Final    NO GROWTH 5 DAYS Performed at [facility], [place] [address] [place], [place] [address]    Report Status 01/31/2021 FINAL  Final  Resp Panel by RT-PCR (Flu A&B, Covid) Nasopharyngeal Swab     Status: None   Collection Time: 01/26/21  1:00 PM   Specimen: Nasopharyngeal Swab; Nasopharyngeal(NP) swabs in vial transport medium  Result Value Ref Range Status   SARS Coronavirus 2 by RT PCR NEGATIVE NEGATIVE Final    Comment: (NOTE) SARS-CoV-2 target nucleic acids are NOT DETECTED.  The SARS-CoV-2 RNA is generally detectable in upper respiratory specimens during the acute phase of infection. The lowest concentration of SARS-CoV-2 viral copies this assay can detect is 138 copies/mL. A negative result does not preclude SARS-Cov-2 infection and should not be used as the sole basis for treatment or other patient management decisions. A negative result may occur with  improper  specimen collection/handling, submission of specimen other than nasopharyngeal swab, presence of viral mutation(s) within the areas targeted by this assay, and inadequate number of viral copies(<138 copies/mL). A negative result must be combined with clinical observations, patient history, and epidemiological information. The expected result is Negative.  Fact Sheet for Patients:  EntrepreneurPulse.com.au  Fact Sheet for Healthcare Providers:  IncredibleEmployment.be  This test is no t yet approved or cleared by the [place] FDA and  has been authorized for detection and/or diagnosis of SARS-CoV-2 by FDA under an Emergency Use Authorization (EUA). This EUA will remain  in effect (meaning this test can be used) for the duration of the COVID-19 declaration under Section 564(b)(1) of the Act, 21 U.S.C.section 360bbb-3(b)(1), unless the authorization is terminated  or revoked sooner.       Influenza A by PCR NEGATIVE NEGATIVE Final   Influenza B by PCR NEGATIVE NEGATIVE Final    Comment: (NOTE) The Xpert Xpress SARS-CoV-2/FLU/RSV plus assay is intended as an aid in the diagnosis of influenza from Nasopharyngeal swab specimens and should not be used as a sole basis for treatment. Nasal washings and aspirates are unacceptable for Xpert Xpress SARS-CoV-2/FLU/RSV testing.  Fact Sheet for Patients: EntrepreneurPulse.com.au  Fact Sheet for Healthcare Providers: IncredibleEmployment.be  This test is not yet approved or cleared by the [place] FDA and has been authorized for detection and/or diagnosis of SARS-CoV-2 by FDA under an Emergency Use Authorization (EUA). This EUA will remain in effect (meaning this test can be used)  for the duration of the COVID-19 declaration under Section 564(b)(1) of the Act, 21 U.S.C. section 360bbb-3(b)(1), unless the authorization is terminated or revoked.  Performed at  [facility], [place] [address], [place], [place] [address]   Blood culture (routine x 2)     Status: None   Collection Time: 01/26/21  1:30 PM   Specimen: BLOOD  Result Value Ref Range Status   Specimen Description BLOOD SITE NOT SPECIFIED  Final   Special Requests   Final    BOTTLES DRAWN AEROBIC AND ANAEROBIC Blood Culture results may not be optimal due to an inadequate volume of blood received in culture bottles   Culture   Final    NO GROWTH 5 DAYS Performed at [facility], [place] [address] [place], [place] [address]    Report Status 01/31/2021 FINAL  Final  Urine Culture     Status: Abnormal   Collection Time: 01/26/21  3:50 PM   Specimen: Urine, Random  Result Value Ref Range Status   Specimen Description URINE, RANDOM  Final   Special Requests NONE  Final   Culture (A)  Final    <10,000 COLONIES/mL INSIGNIFICANT GROWTH Performed at [facility], [place] [address] [place], [place] [address]    Report Status 01/27/2021 FINAL  Final     Radiology Studies: VAS [place] LOWER EXTREMITY VENOUS (DVT)  Result Date: 02/01/2021  Lower Venous DVT Study Indications: Recent history right lower extremity DVT seen out outside facility.  Risk Factors: Chest wall hematoma. Limitations: Body habitus, poor ultrasound/tissue interface and restricted mobility; patient unable to cooperate. Comparison Study: No prior study at [facility]. Performing Technologist: Maudry Mayhew MHA, RDMS, RVT, RDCS  Examination Guidelines: A complete evaluation includes B-mode imaging, spectral Doppler, color Doppler, and power Doppler as needed of all accessible portions of each vessel. Bilateral testing is considered an integral part of a complete examination. Limited examinations for reoccurring indications may be performed as noted. The reflux portion of the exam is performed with the patient in reverse Trendelenburg.  +---------+---------------+---------+-----------+----------+-----------------+  RIGHT    CompressibilityPhasicitySpontaneityPropertiesThrombus Aging    +---------+---------------+---------+-----------+----------+-----------------+ CFV      Full           Yes      Yes                                    +---------+---------------+---------+-----------+----------+-----------------+ SFJ      Full                                                           +---------+---------------+---------+-----------+----------+-----------------+ FV Prox  Full                                                           +---------+---------------+---------+-----------+----------+-----------------+ FV Mid   None                    No                   Age Indeterminate +---------+---------------+---------+-----------+----------+-----------------+  FV DistalPartial                 Yes                  Age Indeterminate +---------+---------------+---------+-----------+----------+-----------------+ PFV      Full                                                           +---------+---------------+---------+-----------+----------+-----------------+ POP      Full           Yes      Yes                                    +---------+---------------+---------+-----------+----------+-----------------+ PTV      None                    No                   Age Indeterminate +---------+---------------+---------+-----------+----------+-----------------+ TPT                              No                   Age Indeterminate +---------+---------------+---------+-----------+----------+-----------------+   Right Technical Findings: Not visualized segments include peroneal veins.  +---------+---------------+---------+-----------+----------+--------------+ LEFT     CompressibilityPhasicitySpontaneityPropertiesThrombus Aging +---------+---------------+---------+-----------+----------+--------------+ CFV      Full           Yes      Yes                                  +---------+---------------+---------+-----------+----------+--------------+ SFJ      Full                                                        +---------+---------------+---------+-----------+----------+--------------+ FV Prox  Full                                                        +---------+---------------+---------+-----------+----------+--------------+ FV Mid   Full                                                        +---------+---------------+---------+-----------+----------+--------------+ FV DistalFull                                                        +---------+---------------+---------+-----------+----------+--------------+ PFV      Full                                                        +---------+---------------+---------+-----------+----------+--------------+  POP      Full           Yes      Yes                                 +---------+---------------+---------+-----------+----------+--------------+   Left Technical Findings: Not visualized segments include PTV, peroneal veins.   Summary: BILATERAL: -No evidence of popliteal cyst, bilaterally. RIGHT: - Findings consistent with age indeterminate deep vein thrombosis involving the right femoral vein, right posterior tibial veins, and tibioperoneal trunk. - Fluid filled/complex area of the right medial calf with vascularized septations visualized; unable to accurately measure secondary to technical limitations listed above. Etiology is unknown.  LEFT: - There is no evidence of deep vein thrombosis in the lower extremity. However, portions of this examination were limited- see technologist comments above.  *See table(s) above for measurements and observations.    Preliminary      LOS: 6 days   Antonieta Pert, MD [facility]  02/01/2021, 1:45 PM

## 2021-02-01 NOTE — Progress Notes (Signed)
Occupational Therapy Treatment Patient Details Name: Rebecca Pruitt MRN: [id number] DOB: [birth year] Today's Date: 02/01/2021    History of present illness Rebecca Pruitt is an 87 y/o female admitted on 01/26/21 due to UTI. CT found right chest hematoma. Pt recently d/c from [place] on 01/27/21. PMH includes history of recurrent UTIs, CVA, HTN, CAD, dementia, chronic A-fib, history of DVTs, and HLD.   OT comments  Session focused on progressing tolerance and establishing splinting schedule to prevent contracture progression of L hand. Pt lethargic this AM, tolerating increased ROM of L hand prior to donning resting hand splint. Increased time needed to position L thumb properly on resting hand splint. Plan to monitor pt tolerance at 2-4-6 hour mark today for skin integrity and appropriate positioning of splint. RN present at start of session and aware of plan. If pt tolerates well today, will update orders with consideration to wear splint at night due to active movement of L UE during the day.    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 and almost deaf in the other (hears better in left ear), dementia at baseline, right chest and flank hematoma, right leg hematoma Required Braces or Orthoses: Other Brace Other Brace: L resting hand splint donned Restrictions Weight Bearing Restrictions: No       Mobility Bed Mobility                    Transfers                      Balance                                           ADL either performed or assessed with clinical judgement   ADL Overall ADL's : Needs assistance/impaired                                        General ADL Comments: Session focused on assessment of resting hand splint and modifications to assess tolerance.     Vision   Vision Assessment?: Vision impaired- to be further tested in functional context Additional Comments: legally blind   Perception     Praxis      Cognition Arousal/Alertness: Lethargic Behavior During Therapy: Flat affect Overall Cognitive Status: History of cognitive impairments - at baseline Area of Impairment: Orientation;Attention;Memory;Following commands;Safety/judgement;Awareness;Problem solving                 Orientation Level: Disoriented to;Place;Time;Situation Current Attention Level: Sustained Memory: Decreased short-term memory Following Commands: Follows one step commands inconsistently;Follows one step commands with increased time Safety/Judgement: Decreased awareness of safety;Decreased awareness of deficits Awareness: Intellectual Problem Solving: Slow processing;Decreased initiation;Requires verbal cues;Requires tactile cues;Difficulty sequencing General Comments: lethargic this AM        Exercises     Shoulder Instructions       General Comments RN present at start of session, collaborated on plan for tolerance assessment today.    Pertinent Vitals/ Pain  Pain Assessment: Faces Faces Pain Scale: Hurts a little bit Pain Location: R hand with ROM Pain Descriptors / Indicators: Grimacing;Guarding;Moaning Pain Intervention(s): Monitored during session  Home Living                                          Prior Functioning/Environment              Frequency  Min 2X/week        Progress Toward Goals  OT Goals(current goals can now be found in the care plan section)  Progress towards OT goals: Progressing toward goals  Acute Rehab OT Goals Patient Stated Goal: none stated today OT Goal Formulation: Patient unable to participate in goal  setting Time For Goal Achievement: 02/13/21 Potential to Achieve Goals: Fair ADL Goals Additional ADL Goal #1: Pt to tolerate L UE splint wear for 4-6 hours with no signs of skin breakdown Additional ADL Goal #2: Pt to demonstrate rolling side to side in bed with Mod A to ease caregiver burden for ADLs Additional ADL Goal #3: Caregivers to independently demonstrate optimal safety techniques for completion of ADLs to prevent injury  Plan Discharge plan remains appropriate    Co-evaluation                 AM-PAC OT "6 Clicks" Daily Activity     Outcome Measure   Help from another person eating meals?: Total Help from another person taking care of personal grooming?: Total Help from another person toileting, which includes using toliet, bedpan, or urinal?: Total Help from another person bathing (including washing, rinsing, drying)?: Total Help from another person to put on and taking off regular upper body clothing?: Total Help from another person to put on and taking off regular lower body clothing?: Total 6 Click Score: 6    End of Session Equipment Utilized During Treatment: Oxygen  OT Visit Diagnosis: Muscle weakness (generalized) (M62.81);Pain;Other symptoms and signs involving cognitive function Pain - Right/Left: Left Pain - part of body: Hand   Activity Tolerance Patient tolerated treatment well;Patient limited by pain   Patient Left in bed;with call bell/phone within reach;with bed alarm set   Nurse Communication Other (comment) (splinting plan)        Time: 3212-2482 OT Time Calculation (min): 12 min  Charges: OT General Charges $OT Visit: 1 Visit OT Treatments $Orthotics/Prosthetics Check: 8-22 mins  Malachy Chamber, OTR/L Acute Rehab Services Office: [phone]   Layla Maw 02/01/2021, 8:36 AM

## 2021-02-01 NOTE — Progress Notes (Signed)
Bilateral lower extremity venous duplex completed. Refer to "CV Proc" under chart review to view preliminary results.  02/01/2021 11:15 AM Kelby Aline., MHA, RVT, RDCS, RDMS

## 2021-02-01 NOTE — Progress Notes (Signed)
Occupational Therapy Treatment  Pt seen this PM to assess tolerance of resting hand splint and establish splinting schedule. Pt lethargic today, resting from AM to PM so resting hand splint remained in place. In comparison during pt's waking hours, pt able to actively move L UE so splint does not stay in place as well. Minor redness noted on thumb and index finger pad that resolved within 15 min, no other skin integrity concerns noted. Educated NT and RN, updated orders and posted splint wear/care sheet in pt's room. Pt to wear L resting hand splint at night for 6 hours and/or when resting during the day. Hope to coordinate family education on splinting schedule as well.    02/01/21 1305  OT Visit Information  Last OT Received On 02/01/21  Assistance Needed +2  History of Present Illness Rebecca Pruitt is an 87 y/o female admitted on 01/26/21 due to UTI. CT found right chest hematoma. Pt recently d/c from [place] on 01/27/21. PMH includes history of recurrent UTIs, CVA, HTN, CAD, dementia, chronic A-fib, history of DVTs, and HLD.  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 and almost deaf in the other (hears better in left ear), dementia at baseline, right chest and flank hematoma, right leg hematoma  Required Braces or Orthoses Other Brace  Other Brace L resting hand splint donned  Pain Assessment  Pain Assessment Faces  Faces Pain Scale 0  Cognition  Arousal/Alertness Lethargic  Behavior During Therapy Flat affect  Overall Cognitive Status History of cognitive impairments - at baseline  Area of Impairment Orientation;Attention;Memory;Following commands;Safety/judgement;Awareness;Problem solving  Orientation Level Disoriented to;Place;Time;Situation  Current Attention Level Sustained  Memory Decreased short-term memory  Following Commands Follows one step commands inconsistently;Follows one step commands with increased time  Safety/Judgement Decreased awareness of  safety;Decreased awareness of deficits  Awareness Intellectual  Problem Solving Slow processing;Decreased initiation;Requires verbal cues;Requires tactile cues;Difficulty sequencing  General Comments lethargic this PM, as well  Difficult to assess due to Hard of hearing/deaf;Impaired communication  Upper Extremity Assessment  Upper Extremity Assessment RUE deficits/detail  RUE Deficits / Details no active movement noted during session today, though pt reports R hand dominant. To be further assessed  RUE Coordination decreased fine motor;decreased gross motor  LUE Deficits / Details flexor tone with contractures noted. PIP/DIP able to be stretched with MCPs to about 75* before increased grimacing and unable to stretch further. able to demo voluntary movement of elbow for placement of resting hand splint. PROM shoulder abduction 80* - with pt assisting  LUE Coordination decreased fine motor;decreased gross motor  Lower Extremity Assessment  Lower Extremity Assessment Defer to PT evaluation  ADL  Overall ADL's  Needs assistance/impaired  General ADL Comments Session focused on assessment of resting hand splint and modifications to assess tolerance. Collaborated with NT/RN on establishment of wearing schedule  Restrictions  Weight Bearing Restrictions No  Vision- Assessment  Vision Assessment? Vision impaired- to be further tested in functional context  Additional Comments legally blind  General Comments  General comments (skin integrity, edema, etc.) Mild redness on inner thumb and index finger pad though resolving after splint wear  OT - End of Session  Equipment Utilized During Treatment Oxygen  Activity Tolerance Patient tolerated treatment well;Patient limited by pain  Patient left in bed;with call bell/phone within reach;with bed alarm set  Nurse Communication Other (comment) (splinting plan)  OT Assessment/Plan  OT Plan Discharge plan remains appropriate  OT Visit Diagnosis Muscle  weakness (generalized) (M62.81);Pain;Other symptoms  and signs involving cognitive function  Pain - Right/Left Left  Pain - part of body Hand  OT Frequency (ACUTE ONLY) Min 2X/week  Follow Up Recommendations SNF;Supervision/Assistance - 24 hour (if family opts to take pt home, will need max HH services and 24/7 assist)  [facility] bed;Other (comment) (hoyer lift)  AM-PAC OT "6 Clicks" Daily Activity Outcome Measure (Version 2)  Help from another person eating meals? 1  Help from another person taking care of personal grooming? 1  Help from another person toileting, which includes using toliet, bedpan, or urinal? 1  Help from another person bathing (including washing, rinsing, drying)? 1  Help from another person to put on and taking off regular upper body clothing? 1  Help from another person to put on and taking off regular lower body clothing? 1  6 Click Score 6  Acute Rehab OT Goals  Patient Stated Goal none stated today  OT Goal Formulation Patient unable to participate in goal setting  Time For Goal Achievement 02/13/21  Potential to Achieve Goals Fair  ADL Goals  Additional ADL Goal #1 Pt to tolerate L UE splint wear for 4-6 hours with no signs of skin breakdown  Additional ADL Goal #2 Pt to demonstrate rolling side to side in bed with Mod A to ease caregiver burden for ADLs  Additional ADL Goal #3 Caregivers to independently demonstrate optimal safety techniques for completion of ADLs to prevent injury  OT Time Calculation  OT Start Time (ACUTE ONLY) 1250  OT Stop Time (ACUTE ONLY) 1302  OT Time Calculation (min) 12 min  OT General Charges  $OT Visit 1 Visit  OT Treatments  $Therapeutic Activity 8-22 mins

## 2021-02-01 NOTE — Care Management Important Message (Signed)
Important Message  Patient Details  Name: Rebecca Pruitt MRN: [id number] Date of Birth: 29-Apr-1929   Medicare Important Message Given:  Yes - Important Message mailed due to current National Emergency   Verbal consent obtained due to current National Emergency  Relationship to patient: Self Contact Name: [place] Call Date: 02/01/21  Time: 1239 Phone: [phone] Outcome: No Answer/Busy Important Message mailed to: Patient address on file    Delorse Lek 02/01/2021, 12:40 PM

## 2021-02-01 NOTE — Progress Notes (Signed)
Calorie Count Note  48 hour calorie count ordered.  Diet: note diet was changed back to carb modified today (4/15) by MD Supplements: Ensure Enlive TID, Magic Cup TID  Day 1 (4/13) Results: Breakfast: 136 kcals, 7g protein Lunch: 445 kcals, 21g protein Dinner: 335 kcals, 18g protein Supplements/Snacks: 423 kcals, 20g protein  Total intake: 1339 kcal (89% of minimum estimated needs)  66g protein (88% of minimum estimated needs)   Day 2 (4/14) Results: Breakfast: 115 kcals, 5g protein Lunch: 355 kcals, 20g protein Dinner: 270 kcals, 14g protein Supplements/Snacks: 713 kcals, 20g protein  Total intake: 1453 kcal (97% of minimum estimated needs)  59g protein (79% of minimum estimated needs)  Estimated Nutritional Needs:  Kcal:  1500-1700 Protein:  75-85 grams Fluid:  >1.5L/d  Discussed pt with RN who reports pt does really well with meals, but ONLY if given assistance and encouragement throughout the entire meal. Pt requires 1:1 feeding assistance. Also note that pt is likely to say she is too sleepy to eat, but should be encouraged by staff to finish her meals regardless and she will typically do so provided the encouragement and feeding is continued.  Nutrition Dx: Severe Malnutrition related to chronic illness (dementia) as evidenced by severe fat depletion,severe muscle depletion. - ongoing  Goal: Patient will meet greater than or equal to 90% of their needs  - progressing  Intervention:  -d/c Calorie count -Recommend diet liberalization -1:1 feeding assist for all meals -continue Ensure Enlive po TID, each supplement provides 350 kcal and 20 grams of protein -continue Magic cup TID with meals, each supplement provides 290 kcal and 9 grams of protein -continue mvi with minerals daily   Larkin Ina, MS, RD, LDN RD pager number and weekend/on-call pager number located in [place].

## 2021-02-01 NOTE — Progress Notes (Signed)
Notified MD V. Rathore that pt has had multiple heart pauses per telemetry. This RN received a call and tele stated that pt had had a pause of 2.3-2.5 secs. Pt was asymptomatic and MD ordered a STAT 12 lead EKG. EKG on chart.    Paulla Fore, RN, BSN

## 2021-02-02 DIAGNOSIS — N3001 Acute cystitis with hematuria: Secondary | ICD-10-CM | POA: Diagnosis not present

## 2021-02-02 LAB — CBC
HCT: 29.3 % — ABNORMAL LOW (ref 36.0–46.0)
Hemoglobin: 9 g/dL — ABNORMAL LOW (ref 12.0–15.0)
MCH: 27.7 pg (ref 26.0–34.0)
MCHC: 30.7 g/dL (ref 30.0–36.0)
MCV: 90.2 fL (ref 80.0–100.0)
Platelets: 352 10*3/uL (ref 150–400)
RBC: 3.25 MIL/uL — ABNORMAL LOW (ref 3.87–5.11)
RDW: 17.9 % — ABNORMAL HIGH (ref 11.5–15.5)
WBC: 8.2 10*3/uL (ref 4.0–10.5)
nRBC: 0 % (ref 0.0–0.2)

## 2021-02-02 LAB — GLUCOSE, CAPILLARY
Glucose-Capillary: 167 mg/dL — ABNORMAL HIGH (ref 70–99)
Glucose-Capillary: 169 mg/dL — ABNORMAL HIGH (ref 70–99)

## 2021-02-02 LAB — BASIC METABOLIC PANEL
Anion gap: 11 (ref 5–15)
BUN: 25 mg/dL — ABNORMAL HIGH (ref 8–23)
CO2: 23 mmol/L (ref 22–32)
Calcium: 9.8 mg/dL (ref 8.9–10.3)
Chloride: 106 mmol/L (ref 98–111)
Creatinine, Ser: 0.7 mg/dL (ref 0.44–1.00)
GFR, Estimated: >60 mL/min (ref >60)
Glucose, Bld: 234 mg/dL — ABNORMAL HIGH (ref 70–99)
Potassium: 4.1 mmol/L (ref 3.5–5.1)
Sodium: 140 mmol/L (ref 135–145)

## 2021-02-02 NOTE — Progress Notes (Signed)
DISCHARGE NOTE HOME [place] to be discharged home per MD order. Discussed prescriptions and follow up appointments with the patient. Prescriptions given to patient; medication list explained in detail. Patient verbalized understanding.  Skin clean, dry and intact without evidence of skin break down, no evidence of skin tears noted. IV catheter discontinued intact. Site without signs and symptoms of complications. Dressing and pressure applied. Pt denies pain at the site currently. No complaints noted.  Patient free of lines, drains, and wounds.   An After Visit Summary (AVS) was printed and given to the patient. Patient escorted via wheelchair, and discharged home via private auto.  Akul Leggette S Phoua Hoadley, RN

## 2021-02-03 NOTE — Discharge Summary (Signed)
Physician Discharge Summary  [facility] LXB:[id number] DOB: 1929/10/15 DOA: 01/26/2021  PCP: Vicenta Aly, FNP  Admit date: 01/26/2021 Discharge date: 02/03/2021  Admitted From: Home Disposition:  Home  Recommendations for Outpatient Follow-up:  1. Follow up with PCP in 1 week 2. Follow up with Urology Dr. Milford Cage outpatient to schedule stent exchanges. 3. Follow up with Infectious Disease.  Referral placed per daughter's request.   Home Health: None  Equipment/Devices:  None  Discharge Condition: Stable Code Status:   Code Status: Prior Diet recommendation:  Diet Order            Diet - low sodium heart healthy                  Brief/Interim Summary:  87 year old female who is bedbound with PMH of multiple medical problems including Chronic Hypoxic Respiratory Failure requiring 2L [place], Chronic Afib and DVT on Eliquis and recurrent UTIs and right hydronephrosis with right ureteral stent in place presents to the ED with UTI.  Urology Dr. Milford Cage removed her stent and patient will follow up with him in his clinic outpatient.  Ms. Duncanson received fluids and antibiotics and clinically improved.  Daughter requested referral to infectious disease doctor given recurrent UTIs.  This was also placed on discharge per daughter's request.  Patient also presented with a hematoma on the chest wall while on Eliquis.  This was evaluated by Surgery who deemed this to be stable.  On 4/11, patient's hemoglobin dropped to 6.6 and she received one unit pRBC.  There was no acute bleeding and hemoglobin was stable after that.  Given patient's multiple medical problems, we discussed palliative care options with entire family.  They would like to hold off on this discussion.  Patient was discharged home in stable condition.  She will follow up with PCP and Urologist outpatient.  Discharge Diagnoses:  Principal Problem:   UTI (urinary tract infection) Active Problems:   Dementia (HCC)   DM2  (diabetes mellitus, type 2) (HCC)   Acute metabolic encephalopathy   Atrial fibrillation, chronic (HCC)   Hematoma (nontraumatic) of breast   Protein-calorie malnutrition, severe   Pressure Ulcer: Pressure Injury 11/23/20 Coccyx Mid Stage 1 -  Intact skin with non-blanchable redness of a localized area usually over a bony prominence. pink/non blanchable (Active)  11/23/20 1640  Location: Coccyx  Location Orientation: Mid  Staging: Stage 1 -  Intact skin with non-blanchable redness of a localized area usually over a bony prominence.  Wound Description (Comments): pink/non blanchable  Present on Admission: Yes    Consults:  Urology  Surgery  Subjective: Patient was medically stable on day of discharge. Discharge Exam: Vitals:   02/01/21 2136 02/02/21 1001  BP: (!) 136/56 (!) 101/54  Pulse: 82 72  Resp: 15 18  Temp: 97.9 F (36.6 C) 97.9 F (36.6 C)  SpO2: 100% 100%   General: frail thin debilitated Cardiovascular: RRR, S1/S2 +, no rubs, no gallops Respiratory: CTA bilaterally, no wheezing, no rhonchi Abdominal: Soft, NT, ND, bowel sounds + Extremities: no edema, no cyanosis  Discharge Instructions  Discharge Instructions    Ambulatory referral to Infectious Disease   Complete by: As directed    Ambulatory referral to Urology   Complete by: As directed    Diet - low sodium heart healthy   Complete by: As directed    Increase activity slowly   Complete by: As directed    No wound care   Complete by: As directed  Allergies as of 02/02/2021      Reactions   Codeine Other (See Comments)   Syncope       Medication List    TAKE these medications   apixaban 5 MG Tabs tablet Commonly known as: ELIQUIS Take 1 tablet (5 mg total) by mouth 2 (two) times daily. Check cbc in 4 days and resume if hb stable and okay with your PCP Start taking on: February 05, 2021 What changed:   additional instructions  These instructions start on February 05, 2021. If you are  unsure what to do until then, ask your doctor or other care provider.   atorvastatin 40 MG tablet Commonly known as: LIPITOR Take 1 tablet (40 mg total) by mouth daily.   B-D ULTRAFINE III SHORT PEN 31G X 8 MM Misc Generic drug: Insulin Pen Needle See admin instructions.   CRANBERRY PO Take 1 tablet by mouth 2 (two) times daily.   donepezil 10 MG tablet Commonly known as: ARICEPT Take 10 mg by mouth at bedtime.   furosemide 20 MG tablet Commonly known as: LASIX Take 1 tablet (20 mg total) by mouth daily as needed for fluid or edema (swelling, weight gain >2 lbs). What changed: how much to take   insulin NPH-regular Human (70-30) 100 UNIT/ML injection Inject 1-10 Units into the skin 3 (three) times daily as needed (high blood sugar). If blood sugar is over 150 takes 1 unit ; sliding scale   melatonin 3 MG Tabs tablet Take 3 mg by mouth at bedtime.   mupirocin ointment 2 % Commonly known as: BACTROBAN Apply 1 application topically daily as needed (cuts).   OXYGEN Inhale 2 L into the lungs at bedtime.       Allergies  Allergen Reactions  . Codeine Other (See Comments)    Syncope     The results of significant diagnostics from this hospitalization (including imaging, microbiology, ancillary and laboratory) are listed below for reference.    Microbiology: Recent Results (from the past 240 hour(s))  Blood culture (routine x 2)     Status: None   Collection Time: 01/26/21 12:44 PM   Specimen: BLOOD  Result Value Ref Range Status   Specimen Description BLOOD SITE NOT SPECIFIED  Final   Special Requests   Final    BOTTLES DRAWN AEROBIC AND ANAEROBIC Blood Culture results may not be optimal due to an inadequate volume of blood received in culture bottles   Culture   Final    NO GROWTH 5 DAYS Performed at [facility], [place] [address] [place], [place] [address]    Report Status 01/31/2021 FINAL  Final  Resp Panel by RT-PCR (Flu A&B, Covid) Nasopharyngeal Swab      Status: None   Collection Time: 01/26/21  1:00 PM   Specimen: Nasopharyngeal Swab; Nasopharyngeal(NP) swabs in vial transport medium  Result Value Ref Range Status   SARS Coronavirus 2 by RT PCR NEGATIVE NEGATIVE Final    Comment: (NOTE) SARS-CoV-2 target nucleic acids are NOT DETECTED.  The SARS-CoV-2 RNA is generally detectable in upper respiratory specimens during the acute phase of infection. The lowest concentration of SARS-CoV-2 viral copies this assay can detect is 138 copies/mL. A negative result does not preclude SARS-Cov-2 infection and should not be used as the sole basis for treatment or other patient management decisions. A negative result may occur with  improper specimen collection/handling, submission of specimen other than nasopharyngeal swab, presence of viral mutation(s) within the areas targeted by this  assay, and inadequate number of viral copies(<138 copies/mL). A negative result must be combined with clinical observations, patient history, and epidemiological information. The expected result is Negative.  Fact Sheet for Patients:  EntrepreneurPulse.com.au  Fact Sheet for Healthcare Providers:  IncredibleEmployment.be  This test is no t yet approved or cleared by the [place] FDA and  has been authorized for detection and/or diagnosis of SARS-CoV-2 by FDA under an Emergency Use Authorization (EUA). This EUA will remain  in effect (meaning this test can be used) for the duration of the COVID-19 declaration under Section 564(b)(1) of the Act, 21 U.S.C.section 360bbb-3(b)(1), unless the authorization is terminated  or revoked sooner.       Influenza A by PCR NEGATIVE NEGATIVE Final   Influenza B by PCR NEGATIVE NEGATIVE Final    Comment: (NOTE) The Xpert Xpress SARS-CoV-2/FLU/RSV plus assay is intended as an aid in the diagnosis of influenza from Nasopharyngeal swab specimens and should not be used as a sole basis  for treatment. Nasal washings and aspirates are unacceptable for Xpert Xpress SARS-CoV-2/FLU/RSV testing.  Fact Sheet for Patients: EntrepreneurPulse.com.au  Fact Sheet for Healthcare Providers: IncredibleEmployment.be  This test is not yet approved or cleared by the [place] FDA and has been authorized for detection and/or diagnosis of SARS-CoV-2 by FDA under an Emergency Use Authorization (EUA). This EUA will remain in effect (meaning this test can be used) for the duration of the COVID-19 declaration under Section 564(b)(1) of the Act, 21 U.S.C. section 360bbb-3(b)(1), unless the authorization is terminated or revoked.  Performed at [facility], [place] [address] [place], [place] [address]   Blood culture (routine x 2)     Status: None   Collection Time: 01/26/21  1:30 PM   Specimen: BLOOD  Result Value Ref Range Status   Specimen Description BLOOD SITE NOT SPECIFIED  Final   Special Requests   Final    BOTTLES DRAWN AEROBIC AND ANAEROBIC Blood Culture results may not be optimal due to an inadequate volume of blood received in culture bottles   Culture   Final    NO GROWTH 5 DAYS Performed at [facility], [place] [address] [place], [place] [address]    Report Status 01/31/2021 FINAL  Final  Urine Culture     Status: Abnormal   Collection Time: 01/26/21  3:50 PM   Specimen: Urine, Random  Result Value Ref Range Status   Specimen Description URINE, RANDOM  Final   Special Requests NONE  Final   Culture (A)  Final    <10,000 COLONIES/mL INSIGNIFICANT GROWTH Performed at [facility], [place] [address] [place],  [address]    Report Status 01/27/2021 FINAL  Final    Procedures/Studies: DG Chest 1 View  Result Date: 01/24/2021 CLINICAL DATA:  87 year old female with cough. EXAM: CHEST  1 VIEW COMPARISON:  Chest radiograph dated 01/17/2021. FINDINGS: Shallow inspiration. There is mild diffuse interstitial  prominence. An area of apparent opacity over the right mid lung field may be artifactual or represent developing infiltrate or atelectasis. PA and lateral views of the chest may provide better evaluation. No pleural effusion pneumothorax. Stable cardiomegaly. Atherosclerotic calcification of the aorta. No acute osseous pathology. IMPRESSION: Possible developing infiltrate or atelectasis over the right mid lung field. PA and lateral views of the chest may provide better evaluation. Electronically Signed   By: Anner Crete M.D.   On: 01/24/2021 16:07   DG Tibia/Fibula Right  Result Date: 01/24/2021 CLINICAL DATA:  Bruising and swelling of shin EXAM: RIGHT TIBIA AND FIBULA - 2 VIEW COMPARISON:  None. FINDINGS: No acute bony abnormality. Specifically, no fracture, subluxation, or dislocation. Mild degenerative changes in the right knee most notable in the lateral compartment. Vascular calcifications. IMPRESSION: No acute bony abnormality. Electronically Signed   By: Rolm Baptise M.D.   On: 01/24/2021 17:36   CT Head Wo Contrast  Result Date: 01/26/2021 CLINICAL DATA:  Altered mental status EXAM: CT HEAD WITHOUT CONTRAST TECHNIQUE: Contiguous axial images were obtained from the base of the skull through the vertex without intravenous contrast. COMPARISON:  January 04, 2021 head CT; December 16, 2020 brain MRI FINDINGS: Brain: Diffuse atrophy is stable. There is no intracranial mass hemorrhage, extra-axial fluid collection, or midline shift. There is decreased attenuation throughout the centra semiovale bilaterally. Decreased attenuation is also noted in portions of the internal and external capsules, stable. These changes are consistent with supratentorial small vessel disease. Small vessel disease also noted in the thalami. No acute infarct is evident on this study. Vascular: There is no appreciable hyperdense vessel. There is calcification in each carotid siphon and distal left vertebral artery region. Skull:  The bony calvarium appears intact. Sinuses/Orbits: Patient has undergone previous antrostomy on the left. There is mucosal thickening in several ethmoid air cells. Orbits appear symmetric bilaterally. Other: Mastoid air cells on the left are clear. Postoperative changes involving the mastoids on the right noted with remaining mastoids on the right clear. 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 No acute infarct evident. No mass or hemorrhage. Foci of arterial vascular calcification noted. Previous mastoidectomy on the right. Remaining mastoids are clear. Mucosal thickening noted in several ethmoid air cells. Electronically Signed   By: Lowella Grip III M.D.   On: 01/26/2021 14:18   CT Head Wo Contrast  Result Date: 01/04/2021 CLINICAL DATA:  Mental status changes EXAM: CT HEAD WITHOUT CONTRAST TECHNIQUE: Contiguous axial images were obtained from the base of the skull through the vertex without intravenous contrast. COMPARISON:  12/16/2020 FINDINGS: Brain: There is atrophy and chronic small vessel disease changes. No acute intracranial abnormality. Specifically, no hemorrhage, hydrocephalus, mass lesion, acute infarction, or significant intracranial injury. Vascular: No hyperdense vessel or unexpected calcification. Skull: No acute calvarial abnormality. Sinuses/Orbits: No acute findings Other: None IMPRESSION: Atrophy, chronic microvascular disease. No acute intracranial abnormality. Electronically Signed   By: Rolm Baptise M.D.   On: 01/04/2021 23:33   CT CHEST WO CONTRAST  Result Date: 01/26/2021 CLINICAL DATA:  Chest trauma, large swelling mass of the anterior right chest EXAM: CT CHEST WITHOUT CONTRAST TECHNIQUE: Multidetector CT imaging of the chest was performed following the standard protocol without IV contrast. COMPARISON:  01/24/2021 FINDINGS: Cardiovascular: Aortic atherosclerosis. Dense aortic valve calcifications. Cardiomegaly. Dense mitral annulus  calcifications. Three-vessel coronary artery calcifications and/or stents. Small pericardial effusion. Mediastinum/Nodes: No enlarged mediastinal, hilar, or axillary lymph nodes. Thyroid gland, trachea, and esophagus demonstrate no significant findings. Lungs/Pleura: Lungs are clear. Small right, trace left pleural effusions, similar to prior examination. Upper Abdomen: No acute abnormality. Musculoskeletal: Large hematoma within the right pectoralis major muscle body measuring 8.3 x 5.4 cm (series 3, image 23). Soft tissue contusion of the right chest wall. No chest wall mass or suspicious bone lesions identified. IMPRESSION: 1. Large hematoma within the right pectoralis major muscle body measuring 8.3 x 5.4 cm. Soft tissue contusion of the right chest wall. 2. Small right, trace left pleural effusions, similar to prior examination. 3. Cardiomegaly and small pericardial  effusion, unchanged compared to prior examination. 4. Coronary artery disease. 5. Dense aortic valve calcifications. Dense mitral annulus calcifications. For correlate for echocardiographic evidence of valve dysfunction. Aortic Atherosclerosis (ICD10-I70.0). Electronically Signed   By: Eddie Candle M.D.   On: 01/26/2021 16:07   CT Chest W Contrast  Result Date: 01/24/2021 CLINICAL DATA:  Cough, persistent possible pneumonia.  Fatigue EXAM: CT CHEST WITH CONTRAST TECHNIQUE: Multidetector CT imaging of the chest was performed during intravenous contrast administration. CONTRAST:  41mL OMNIPAQUE IOHEXOL 300 MG/ML  SOLN COMPARISON:  Chest x-ray 01/24/2021 FINDINGS: Cardiovascular: Normal heart size. At least trace volume pericardial effusion. The thoracic aorta is normal in caliber. Moderate atherosclerotic plaque of the thoracic aorta. Mild to moderate four-vessel coronary artery calcifications. Severe mitral annular calcifications. The main pulmonary artery is normal in caliber. No central pulmonary embolus. Mediastinum/Nodes: No enlarged  mediastinal, hilar, or axillary lymph nodes. Subcentimeter hypodensities of the thyroid gland. Otherwise the thyroid gland, trachea, and esophagus demonstrate no significant findings. Lungs/Pleura: Passive atelectasis of the right lower lobe. No focal consolidation. No pulmonary nodule. No pulmonary mass. Trace right pleural effusion. No left pleural effusion. No pneumothorax Upper Abdomen: No acute abnormality. Musculoskeletal: No abdominal wall hernia or abnormality. No suspicious lytic or blastic osseous lesions. No acute displaced fracture. Multilevel degenerative changes of the spine. IMPRESSION: 1. Trace right pleural effusion. 2. Severe mitral annular calcifications. 3. Aortic Atherosclerosis (ICD10-I70.0) including four-vessel coronary calcifications. Electronically Signed   By: Iven Finn M.D.   On: 01/24/2021 19:06   DG Chest Port 1 View  Result Date: 01/26/2021 CLINICAL DATA:  Decreased oxygen saturation.  Dyspnea. EXAM: PORTABLE CHEST 1 VIEW COMPARISON:  January 24, 2021 chest radiograph and chest CT FINDINGS: There is no edema or airspace opacity. There is cardiomegaly with pulmonary vascularity normal. No adenopathy. There is mitral annulus calcification as well as calcification in the aorta and in each carotid artery. Degenerative change noted in each shoulder. IMPRESSION: No edema or airspace opacity. Cardiomegaly. There is mitral annulus calcification. There also foci of carotid artery calcification and aortic atherosclerosis. Aortic Atherosclerosis (ICD10-I70.0). Electronically Signed   By: Lowella Grip III M.D.   On: 01/26/2021 14:20   DG Chest Port 1 View  Result Date: 01/17/2021 CLINICAL DATA:  Syncope.  Weakness. EXAM: PORTABLE CHEST 1 VIEW COMPARISON:  Chest x-ray dated November 22, 2020. FINDINGS: The heart size and mediastinal contours are within normal limits. Both lungs are clear. The visualized skeletal structures are unremarkable. IMPRESSION: No active disease.  Electronically Signed   By: Titus Dubin M.D.   On: 01/17/2021 15:06   VAS [place] LOWER EXTREMITY VENOUS (DVT)  Result Date: 02/01/2021  Lower Venous DVT Study Indications: Recent history right lower extremity DVT seen out outside facility.  Risk Factors: Chest wall hematoma. Limitations: Body habitus, poor ultrasound/tissue interface and restricted mobility; patient unable to cooperate. Comparison Study: No prior study at [facility]. Performing Technologist: Maudry Mayhew MHA, RDMS, RVT, RDCS  Examination Guidelines: A complete evaluation includes B-mode imaging, spectral Doppler, color Doppler, and power Doppler as needed of all accessible portions of each vessel. Bilateral testing is considered an integral part of a complete examination. Limited examinations for reoccurring indications may be performed as noted. The reflux portion of the exam is performed with the patient in reverse Trendelenburg.  +---------+---------------+---------+-----------+----------+-----------------+ RIGHT    CompressibilityPhasicitySpontaneityPropertiesThrombus Aging    +---------+---------------+---------+-----------+----------+-----------------+ CFV      Full           Yes  Yes                                    +---------+---------------+---------+-----------+----------+-----------------+ SFJ      Full                                                           +---------+---------------+---------+-----------+----------+-----------------+ FV Prox  Full                                                           +---------+---------------+---------+-----------+----------+-----------------+ FV Mid   None                    No                   Age Indeterminate +---------+---------------+---------+-----------+----------+-----------------+ FV DistalPartial                 Yes                  Age Indeterminate  +---------+---------------+---------+-----------+----------+-----------------+ PFV      Full                                                           +---------+---------------+---------+-----------+----------+-----------------+ POP      Full           Yes      Yes                                    +---------+---------------+---------+-----------+----------+-----------------+ PTV      None                    No                   Age Indeterminate +---------+---------------+---------+-----------+----------+-----------------+ TPT                              No                   Age Indeterminate +---------+---------------+---------+-----------+----------+-----------------+   Right Technical Findings: Not visualized segments include peroneal veins.  +---------+---------------+---------+-----------+----------+--------------+ LEFT     CompressibilityPhasicitySpontaneityPropertiesThrombus Aging +---------+---------------+---------+-----------+----------+--------------+ CFV      Full           Yes      Yes                                 +---------+---------------+---------+-----------+----------+--------------+ SFJ      Full                                                        +---------+---------------+---------+-----------+----------+--------------+  FV Prox  Full                                                        +---------+---------------+---------+-----------+----------+--------------+ FV Mid   Full                                                        +---------+---------------+---------+-----------+----------+--------------+ FV DistalFull                                                        +---------+---------------+---------+-----------+----------+--------------+ PFV      Full                                                        +---------+---------------+---------+-----------+----------+--------------+ POP      Full            Yes      Yes                                 +---------+---------------+---------+-----------+----------+--------------+   Left Technical Findings: Not visualized segments include PTV, peroneal veins.   Summary: BILATERAL: -No evidence of popliteal cyst, bilaterally. RIGHT: - Findings consistent with age indeterminate deep vein thrombosis involving the right femoral vein, right posterior tibial veins, and tibioperoneal trunk. - Fluid filled/complex area of the right medial calf with vascularized septations visualized; unable to accurately measure secondary to technical limitations listed above. Etiology is unknown.  LEFT: - There is no evidence of deep vein thrombosis in the lower extremity. However, portions of this examination were limited- see technologist comments above.  *See table(s) above for measurements and observations. Electronically signed by Servando Snare MD on 02/01/2021 at 2:27:22 PM.    Final      Labs: BNP (last 3 results) Recent Labs    12/21/20 1521  BNP 063.0*   Basic Metabolic Panel: Recent Labs  Lab 01/28/21 0341 01/29/21 0611 01/30/21 0245 02/02/21 0917  NA 138 135 136 140  K 4.1 3.7 3.5 4.1  CL 107 107 107 106  CO2 27 23 23 23   GLUCOSE 193* 134* 100* 234*  BUN 30* 19 14 25*  CREATININE 1.23* 0.70 0.56 0.70  CALCIUM 9.1 8.9 9.1 9.8   Liver Function Tests: No results for input(s): AST, ALT, ALKPHOS, BILITOT, PROT, ALBUMIN in the last 168 hours. No results for input(s): LIPASE, AMYLASE in the last 168 hours. No results for input(s): AMMONIA in the last 168 hours. CBC: Recent Labs  Lab 01/28/21 0341 01/28/21 1109 01/29/21 0611 01/30/21 0245 02/01/21 0302 02/02/21 0917  WBC 10.9*  --  8.5 6.2 10.5 8.2  HGB 6.6* 8.4* 8.2* 8.7* 9.6* 9.0*  HCT 21.0* 25.8* 25.4* 27.4* 30.4* 29.3*  MCV 87.1  --  86.4 86.7 88.9 90.2  PLT  262  --  271 290 375 352   Cardiac Enzymes: No results for input(s): CKTOTAL, CKMB, CKMBINDEX, TROPONINI in the last 168  hours. BNP: Invalid input(s): POCBNP CBG: Recent Labs  Lab 02/01/21 1145 02/01/21 1640 02/01/21 2136 02/02/21 0616 02/02/21 1150  GLUCAP 122* 160* 276* 167* 169*   D-Dimer No results for input(s): DDIMER in the last 72 hours. Hgb A1c No results for input(s): HGBA1C in the last 72 hours. Lipid Profile No results for input(s): CHOL, HDL, LDLCALC, TRIG, CHOLHDL, LDLDIRECT in the last 72 hours. Thyroid function studies Recent Labs    02/01/21 2227  TSH 0.990   Anemia work up No results for input(s): VITAMINB12, FOLATE, FERRITIN, TIBC, IRON, RETICCTPCT in the last 72 hours. Urinalysis    Component Value Date/Time   COLORURINE BROWN (A) 01/26/2021 1752   APPEARANCEUR TURBID (A) 01/26/2021 1752   LABSPEC 1.020 01/26/2021 1752   PHURINE 5.5 01/26/2021 1752   GLUCOSEU 100 (A) 01/26/2021 1752   HGBUR LARGE (A) 01/26/2021 1752   BILIRUBINUR MODERATE (A) 01/26/2021 1752   KETONESUR NEGATIVE 01/26/2021 1752   PROTEINUR >300 (A) 01/26/2021 1752   UROBILINOGEN 0.2 05/26/2008 0817   NITRITE POSITIVE (A) 01/26/2021 1752   LEUKOCYTESUR LARGE (A) 01/26/2021 1752   Sepsis Labs Invalid input(s): PROCALCITONIN,  WBC,  [facility] Recent Results (from the past 240 hour(s))  Blood culture (routine x 2)     Status: None   Collection Time: 01/26/21 12:44 PM   Specimen: BLOOD  Result Value Ref Range Status   Specimen Description BLOOD SITE NOT SPECIFIED  Final   Special Requests   Final    BOTTLES DRAWN AEROBIC AND ANAEROBIC Blood Culture results may not be optimal due to an inadequate volume of blood received in culture bottles   Culture   Final    NO GROWTH 5 DAYS Performed at [facility], [place] [address] [place], [place] [address]    Report Status 01/31/2021 FINAL  Final  Resp Panel by RT-PCR (Flu A&B, Covid) Nasopharyngeal Swab     Status: None   Collection Time: 01/26/21  1:00 PM   Specimen: Nasopharyngeal Swab; Nasopharyngeal(NP) swabs in vial transport  medium  Result Value Ref Range Status   SARS Coronavirus 2 by RT PCR NEGATIVE NEGATIVE Final    Comment: (NOTE) SARS-CoV-2 target nucleic acids are NOT DETECTED.  The SARS-CoV-2 RNA is generally detectable in upper respiratory specimens during the acute phase of infection. The lowest concentration of SARS-CoV-2 viral copies this assay can detect is 138 copies/mL. A negative result does not preclude SARS-Cov-2 infection and should not be used as the sole basis for treatment or other patient management decisions. A negative result may occur with  improper specimen collection/handling, submission of specimen other than nasopharyngeal swab, presence of viral mutation(s) within the areas targeted by this assay, and inadequate number of viral copies(<138 copies/mL). A negative result must be combined with clinical observations, patient history, and epidemiological information. The expected result is Negative.  Fact Sheet for Patients:  EntrepreneurPulse.com.au  Fact Sheet for Healthcare Providers:  IncredibleEmployment.be  This test is no t yet approved or cleared by the [place] FDA and  has been authorized for detection and/or diagnosis of SARS-CoV-2 by FDA under an Emergency Use Authorization (EUA). This EUA will remain  in effect (meaning this test can be used) for the duration of the COVID-19 declaration under Section 564(b)(1) of the Act, 21 U.S.C.section 360bbb-3(b)(1), unless the authorization is terminated  or revoked  sooner.       Influenza A by PCR NEGATIVE NEGATIVE Final   Influenza B by PCR NEGATIVE NEGATIVE Final    Comment: (NOTE) The Xpert Xpress SARS-CoV-2/FLU/RSV plus assay is intended as an aid in the diagnosis of influenza from Nasopharyngeal swab specimens and should not be used as a sole basis for treatment. Nasal washings and aspirates are unacceptable for Xpert Xpress SARS-CoV-2/FLU/RSV testing.  Fact Sheet for  Patients: EntrepreneurPulse.com.au  Fact Sheet for Healthcare Providers: IncredibleEmployment.be  This test is not yet approved or cleared by the [place] FDA and has been authorized for detection and/or diagnosis of SARS-CoV-2 by FDA under an Emergency Use Authorization (EUA). This EUA will remain in effect (meaning this test can be used) for the duration of the COVID-19 declaration under Section 564(b)(1) of the Act, 21 U.S.C. section 360bbb-3(b)(1), unless the authorization is terminated or revoked.  Performed at [facility], [place] [address] [place], [place] [address]   Blood culture (routine x 2)     Status: None   Collection Time: 01/26/21  1:30 PM   Specimen: BLOOD  Result Value Ref Range Status   Specimen Description BLOOD SITE NOT SPECIFIED  Final   Special Requests   Final    BOTTLES DRAWN AEROBIC AND ANAEROBIC Blood Culture results may not be optimal due to an inadequate volume of blood received in culture bottles   Culture   Final    NO GROWTH 5 DAYS Performed at [facility], [place] [address] [place], [place] [address]    Report Status 01/31/2021 FINAL  Final  Urine Culture     Status: Abnormal   Collection Time: 01/26/21  3:50 PM   Specimen: Urine, Random  Result Value Ref Range Status   Specimen Description URINE, RANDOM  Final   Special Requests NONE  Final   Culture (A)  Final    <10,000 COLONIES/mL INSIGNIFICANT GROWTH Performed at [facility], [place] [address] [place], [place] [address]    Report Status 01/27/2021 FINAL  Final     Time coordinating discharge: 60 minutes  SIGNED: George Hugh, MD  [facility] 02/03/2021, 5:38 PM  If 7PM-7AM, please contact night-coverage [email or web address]

## 2021-02-22 ENCOUNTER — Ambulatory Visit: Payer: Medicare Other | Admitting: Cardiology

## 2021-02-27 ENCOUNTER — Telehealth: Payer: Self-pay | Admitting: Hospice

## 2021-02-27 DIAGNOSIS — Z515 Encounter for palliative care: Secondary | ICD-10-CM

## 2021-02-27 NOTE — Telephone Encounter (Signed)
NP called [name] as scheduled and left her a voicemail with callback number.

## 2021-03-19 ENCOUNTER — Ambulatory Visit (INDEPENDENT_AMBULATORY_CARE_PROVIDER_SITE_OTHER): Payer: Medicare Other | Admitting: Cardiology

## 2021-03-19 ENCOUNTER — Encounter: Payer: Self-pay | Admitting: Cardiology

## 2021-03-19 ENCOUNTER — Other Ambulatory Visit: Payer: Self-pay

## 2021-03-19 VITALS — BP 98/64 | HR 74 | Ht 64.0 in

## 2021-03-19 DIAGNOSIS — I350 Nonrheumatic aortic (valve) stenosis: Secondary | ICD-10-CM

## 2021-03-19 DIAGNOSIS — Z8673 Personal history of transient ischemic attack (TIA), and cerebral infarction without residual deficits: Secondary | ICD-10-CM | POA: Diagnosis not present

## 2021-03-19 DIAGNOSIS — I482 Chronic atrial fibrillation, unspecified: Secondary | ICD-10-CM | POA: Diagnosis not present

## 2021-03-19 DIAGNOSIS — I340 Nonrheumatic mitral (valve) insufficiency: Secondary | ICD-10-CM

## 2021-03-19 DIAGNOSIS — Z7189 Other specified counseling: Secondary | ICD-10-CM | POA: Diagnosis not present

## 2021-03-19 MED ORDER — APIXABAN 2.5 MG PO TABS: 2.5000 mg | ORAL_TABLET | Freq: Two times a day (BID) | ORAL | 3 refills | Status: AC

## 2021-03-19 NOTE — Patient Instructions (Addendum)
Medication Instructions:  Your Physician recommend you continue on your current medication as directed.    *If you need a refill on your cardiac medications before your next appointment, please call your pharmacy*   Lab Work: None ordered today   Testing/Procedures: None ordered today   Follow-Up: At [facility], you and your health needs are our priority.  As part of our continuing mission to provide you with exceptional heart care, we have created designated Provider Care Teams.  These Care Teams include your primary Cardiologist (physician) and Advanced Practice Providers (APPs -  Physician Assistants and Nurse Practitioners) who all work together to provide you with the care you need, when you need it.  We recommend signing up for the patient portal called "MyChart".  Sign up information is provided on this After Visit Summary.  MyChart is used to connect with patients for Virtual Visits (Telemedicine).  Patients are able to view lab/test results, encounter notes, upcoming appointments, etc.  Non-urgent messages can be sent to your provider as well.   To learn more about what you can do with MyChart, go to [email or web address].    Your next appointment:   3 month(s) @ [address] [place], [place] [address]   The format for your next appointment:   In Person  Provider:   Bridgette Christopher, MD     

## 2021-03-19 NOTE — Progress Notes (Signed)
Cardiology Office Note:    Date:  03/19/2021   ID:  Rebecca Pruitt, DOB 12-05-1928, MRN [id number]  PCP:  Vicenta Aly, [place]  Cardiologist:  Buford Dresser, MD  Referring MD: Vicenta Aly, FNP   No chief complaint on file. CC: Follow-up after ED visit  History of Present Illness:    Rebecca Pruitt is a 87 y.o. female with a hx of skin cancer, CAD, diabetes mellitus, CVA, hypertension, and stroke (x6) who is seen today for a follow-up. I previously saw her in the ED 12/14/2020.   Today: She is accompanied by her daughter, who provides most of the history, as she is having some difficulty speaking and later fell asleep during part of the visit. She states she is feeling fine today. She has multiple lesions and bruises on her bilateral UE  Her daughter reports that when she is on antibiotics she feels better. During her hospital visit she developed a significant hematoma in her chest. Also, she bled significantly from a wound on her right upper arm while on a high dose of Eliquis, which prompted a visit to the ED. Of note, she is not walking anymore, and she does not wish to grip the walker per her daughter.  They were considering palliative care but had difficulty with communication.  She denies any palpitations, or exertional symptoms. No headaches, lightheadedness, or syncope to report. Also has no lower extremity edema, orthopnea or PND.  Over the past 15 years she has had six strokes.  Past Medical History:  Diagnosis Date  . Cancer ([place])    skin  . Coronary artery disease   . Diabetes mellitus   . Diabetic retinopathy   . Dyslipidemia   . Family history of adverse reaction to anesthesia    pts daughter vomits and has very difficult time to awaken  . H/O: CVA (cardiovascular accident)   . Hearing loss   . Heart murmur   . History of kidney stones   . Hypertension   . Stroke [facility]) 03/2020   x5    Past Surgical History:  Procedure Laterality Date  .  CESAREAN SECTION    . CYSTOSCOPY WITH RETROGRADE PYELOGRAM, URETEROSCOPY AND STENT PLACEMENT Right 11/26/2020   Procedure: CYSTOSCOPY WITH RIGHT RETROGRADE PYELOGRAM, RIGHT URETEROSCOPY AND PYELOSCOPY, RIGHT STENT PLACEMENT, FULGERATION OF RIGHT URETER AND BLADDER;  Surgeon: Remi Haggard, MD;  Location: [facility];  Service: Urology;  Laterality: Right;  . DILATION AND CURETTAGE OF UTERUS    . EYE SURGERY Bilateral   . hysterectomy - unknown type    . MASTOIDECTOMY     as a child    Current Medications: Current Outpatient Medications on File Prior to Visit  Medication Sig  . apixaban (ELIQUIS) 5 MG TABS tablet Take 1 tablet (5 mg total) by mouth 2 (two) times daily. Check cbc in 4 days and resume if hb stable and okay with your PCP  . atorvastatin (LIPITOR) 40 MG tablet Take 1 tablet (40 mg total) by mouth daily.  Marland Kitchen CRANBERRY PO Take 1 tablet by mouth 2 (two) times daily.  Marland Kitchen donepezil (ARICEPT) 10 MG tablet Take 10 mg by mouth at bedtime.  . furosemide (LASIX) 20 MG tablet Take 1 tablet (20 mg total) by mouth daily as needed for fluid or edema (swelling, weight gain >2 lbs). (Patient taking differently: Take 10 mg by mouth daily as needed for fluid or edema (swelling, weight gain >2 lbs).)  . insulin NPH-insulin regular (NOVOLIN 70/30) (70-30) 100 UNIT/ML  injection Inject 1-10 Units into the skin 3 (three) times daily as needed (high blood sugar). If blood sugar is over 150 takes 1 unit ; sliding scale  . Insulin Pen Needle (B-D ULTRAFINE III SHORT PEN) 31G X 8 MM MISC See admin instructions.  . melatonin 3 MG TABS tablet Take 3 mg by mouth at bedtime.  . mupirocin ointment (BACTROBAN) 2 % Apply 1 application topically daily as needed (cuts).  . OXYGEN Inhale 2 L into the lungs at bedtime.   No current facility-administered medications on file prior to visit.     Allergies:   Codeine   Social History   Tobacco Use  . Smoking status: Never Smoker  . Smokeless tobacco: Never Used   Vaping Use  . Vaping Use: Never used  Substance Use Topics  . Alcohol use: No  . Drug use: Never    Family History: family history includes Prostate cancer in an other family member.  ROS:   Please see the history of present illness. All other systems are reviewed and negative.    EKGs/Labs/Other Studies Reviewed:    The following studies were reviewed today:  LE Venous DVT 02/01/2021: BILATERAL:  -No evidence of popliteal cyst, bilaterally.  RIGHT:  - Findings consistent with age indeterminate deep vein thrombosis  involving the right femoral vein, right posterior tibial veins, and  tibioperoneal trunk.  - Fluid filled/complex area of the right medial calf with vascularized  septations visualized; unable to accurately measure secondary to technical  limitations listed above. Etiology is unknown.  LEFT:  - There is no evidence of deep vein thrombosis in the lower extremity.  However, portions of this examination were limited- see technologist  comments above.    Echo 11/23/2020: 1. Left ventricular ejection fraction, by estimation, is 65 to 70%. The  left ventricle has normal function. The left ventricle has no regional  wall motion abnormalities. There is severe left ventricular hypertrophy.  Left ventricular diastolic parameters  are consistent with Grade II diastolic dysfunction (pseudonormalization).  2. Right ventricular systolic function is normal. The right ventricular  size is normal. There is normal pulmonary artery systolic pressure.  3. Left atrial size was severely dilated.  4. Pericardial effusion - 1.1 cm. Moderate pericardial effusion. The  pericardial effusion is lateral to the left ventricle. There is no  evidence of cardiac tamponade.  5. The mitral valve is normal in structure. Moderate mitral valve  regurgitation. No evidence of mitral stenosis. The mean mitral valve  gradient is 4.0 mmHg. Severe mitral annular calcification.  6. The aortic  valve is tricuspid. Aortic valve regurgitation is not  visualized. Severe aortic valve stenosis. Aortic valve area, by VTI  measures 0.84 cm. Aortic valve mean gradient measures 38.3 mmHg. Aortic  valve Vmax measures 3.97 m/s.  7. The inferior vena cava is normal in size with greater than 50%  respiratory variability, suggesting right atrial pressure of 3 mmHg.   EKG:  EKG is personally reviewed.   03/19/2021: Atrial fibrillation, Rate 74 bpm  Recent Labs: 12/21/2020: B Natriuretic Peptide 477.2 01/07/2021: Magnesium 1.9 01/27/2021: ALT 11 02/01/2021: TSH 0.990 02/02/2021: BUN 25; Creatinine, Ser 0.70; Hemoglobin 9.0; Platelets 352; Potassium 4.1; Sodium 140  Recent Lipid Panel    Component Value Date/Time   CHOL 129 12/17/2020 0354   TRIG 58 12/17/2020 0354   HDL 39 (L) 12/17/2020 0354   CHOLHDL 3.3 12/17/2020 0354   VLDL 12 12/17/2020 0354   LDLCALC 78 12/17/2020 0354  Physical Exam:    VS:  BP 98/64 (BP Location: Left Arm, Patient Position: Sitting, Cuff Size: Normal)   Pulse 74   Ht 5\' 4"  (1.626 m)   BMI 19.03 kg/m     Wt Readings from Last 3 Encounters:  01/26/21 110 lb 14.3 oz (50.3 kg)  01/04/21 105 lb 13.1 oz (48 kg)  12/19/20 104 lb 15.1 oz (47.6 kg)    GEN: Frail, in a wheelchair, comforatble in no acute distress HEENT: Normal, moist mucous membranes NECK: No JVD CARDIAC: regular rhythm, normal S1 and S2, no rubs or gallops. 3/6 SE murmur. VASCULAR: Radial and DP pulses 2+ bilaterally. No carotid bruits RESPIRATORY:  Clear to auscultation without rales, wheezing or rhonchi  ABDOMEN: Soft, non-tender, non-distended MUSCULOSKELETAL:  Ambulates independently SKIN: Warm and dry, no edema. Diffuse ecchymoses NEUROLOGIC:  Answers some questions but minimally interactive. No focal neuro deficits noted. PSYCHIATRIC:  Appears fatigued, minimally interactive but responds to some questions    ASSESSMENT:    1. Atrial fibrillation, chronic ([place])   2. Goals of care,  counseling/discussion   3. History of CVA (cerebrovascular accident)   4. Nonrheumatic aortic valve stenosis   5. Nonrheumatic mitral valve regurgitation    PLAN:    atypical atrial flutter/coarse atrial fibrillation, reported prior history of atrial fibrillation -rate controlled on no agents -tolerating apixaban 2.5 mg BID, though does have diffuse ecchymoses. We discussed. No GI bleeding. Will continue for now. If she develops bleeding, will need to discuss difficult decision about continuing anticoagulation given stroke history -was on aspirin and clopidogrel previously given history of stroke. Initially stopped aspirin with initiation of DOAC. Dr. Leonie Man recommended stopping clopidogrel as well and continuing on DOAC alone. -chadsvasc=7  Goals of care -we discussed her anticoagulation, high stroke risk, and frailty at length. [name], patient's daughter, feels she has declined. They have discussed palliative care in the past, but communication was an issue. Patient is DNR, but for now wish to continue with treatment. [name] does want to avoid hospitalization in the future if possible  Severe aortic stenosis: -per daughter, was asymptomaticprior to recent health decline -we discussed TAVR procedure and workup at length today. [name] does not think they would want to go through any additional procedures.  Moderate MR -euvolemic on exam today  Hyperlipidemia: -continue atorvastatin 40 mg daily given CVA history  Cardiac risk counseling and prevention recommendations: -recommend heart healthy/Mediterranean diet, with whole grains, fruits, vegetable, fish, lean meats, nuts, and olive oil. Limit salt. -recommend moderate walking, 3-5 times/week for 30-50 minutes each session. Aim for at least 150 minutes.week. Goal should be pace of 3 miles/hours, or walking 1.5 miles in 30 minutes -recommend avoidance of tobacco products. Avoid excess alcohol. -ASCVD risk score: The ASCVD  Risk score Mikey Bussing DC Jr., et al., 2013) failed to calculate for the following reasons:   The 2013 ASCVD risk score is only valid for ages 59 to 84    Plan for follow up: 3 months or sooner as needed.  Buford Dresser, MD, PhD, [facility]    Medication Adjustments/Labs and Tests Ordered: Current medicines are reviewed at length with the patient today.  Concerns regarding medicines are outlined above.  Orders Placed This Encounter  Procedures  . EKG 12-Lead   Meds ordered this encounter  Medications  . apixaban (ELIQUIS) 2.5 MG TABS tablet    Sig: Take 1 tablet (2.5 mg total) by mouth 2 (two) times daily.    Dispense:  180  tablet    Refill:  3    Patient Instructions  Medication Instructions:  Your Physician recommend you continue on your current medication as directed.    *If you need a refill on your cardiac medications before your next appointment, please call your pharmacy*   Lab Work: None ordered today   Testing/Procedures: None ordered today   Follow-Up: At [facility], you and your health needs are our priority.  As part of our continuing mission to provide you with exceptional heart care, we have created designated Provider Care Teams.  These Care Teams include your primary Cardiologist (physician) and Advanced Practice Providers (APPs -  Physician Assistants and Nurse Practitioners) who all work together to provide you with the care you need, when you need it.  We recommend signing up for the patient portal called "MyChart".  Sign up information is provided on this After Visit Summary.  MyChart is used to connect with patients for Virtual Visits (Telemedicine).  Patients are able to view lab/test results, encounter notes, upcoming appointments, etc.  Non-urgent messages can be sent to your provider as well.   To learn more about what you can do with MyChart, go to [email or web address].    Your next appointment:   3 month(s) @ [address] [place], [place] [address]   The format for your next appointment:   In Person  Provider:   Buford Dresser, MD        [facility],acting as a scribe for Buford Dresser, MD.,have documented all relevant documentation on the behalf of Buford Dresser, MD,as directed by  Buford Dresser, MD while in the presence of Buford Dresser, MD.  I, Buford Dresser, MD, have reviewed all documentation for this visit. The documentation on 03/26/21 for the exam, diagnosis, procedures, and orders are all accurate and complete.  Signed, Buford Dresser, MD PhD 03/19/2021     [facility]

## 2021-03-26 ENCOUNTER — Encounter: Payer: Self-pay | Admitting: Cardiology

## 2021-03-26 DIAGNOSIS — I340 Nonrheumatic mitral (valve) insufficiency: Secondary | ICD-10-CM | POA: Insufficient documentation

## 2021-03-26 DIAGNOSIS — I350 Nonrheumatic aortic (valve) stenosis: Secondary | ICD-10-CM | POA: Insufficient documentation

## 2021-07-03 ENCOUNTER — Ambulatory Visit (HOSPITAL_BASED_OUTPATIENT_CLINIC_OR_DEPARTMENT_OTHER): Payer: Medicare Other | Admitting: Cardiology

## 2021-07-20 DEATH — deceased

## 2022-03-10 IMAGING — CT CT HEAD W/O CM
3 of 4 series · 13 of 47 positions shown, 15 images · non-contrast
Comparison: January 04, 2021 head CT; December 16, 2020 brain MRI

CLINICAL DATA: Altered mental status

EXAM:
CT HEAD WITHOUT CONTRAST
TECHNIQUE: Contiguous axial images were obtained from the base of the skull
through the vertex without intravenous contrast.

[Series 3: head without · axial · non-contrast · 0.45mm/px · z∈[-98,+22]mm · 7 of 34 slices shown, 9 images]
[im 5/34  brain]
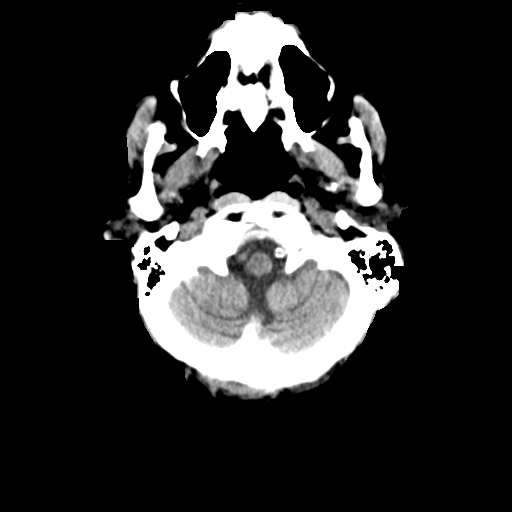
[im 5/34  bone]
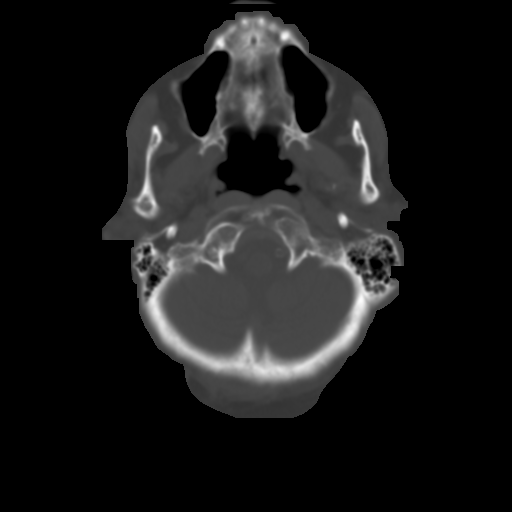
[im 9/34  brain]
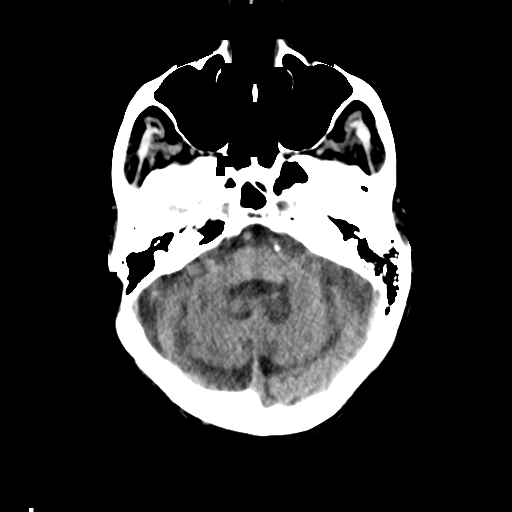
[im 13/34  brain]
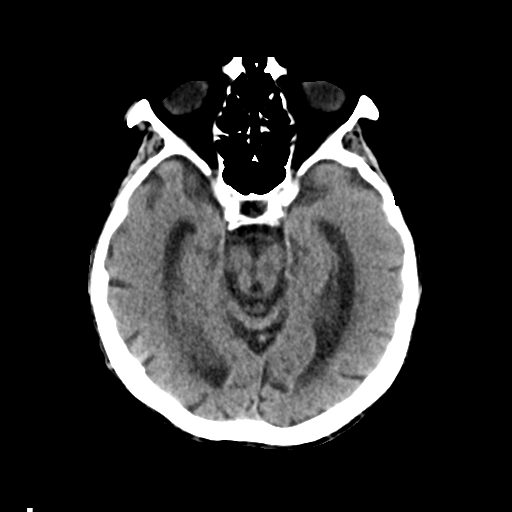
[im 17/34  brain]
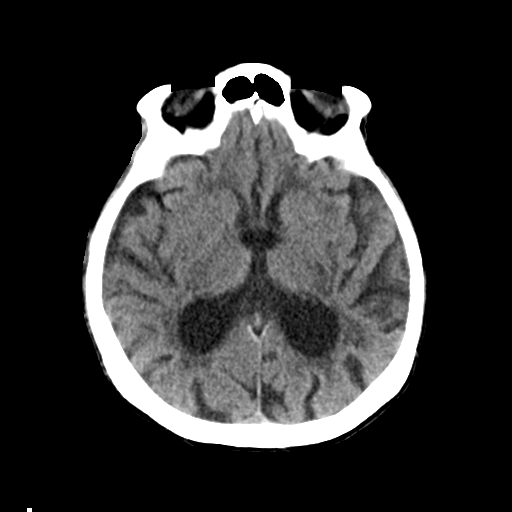
[im 21/34  brain]
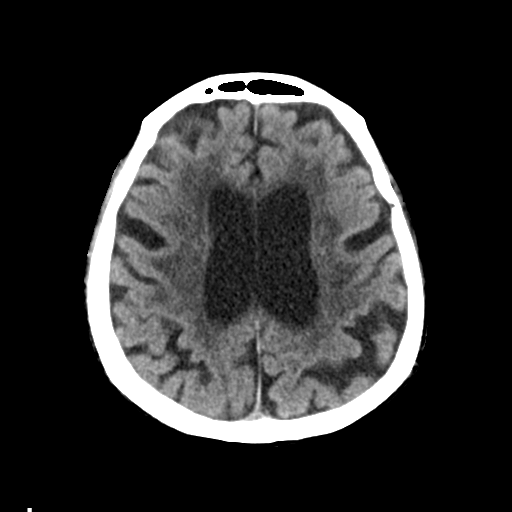
[im 21/34  bone]
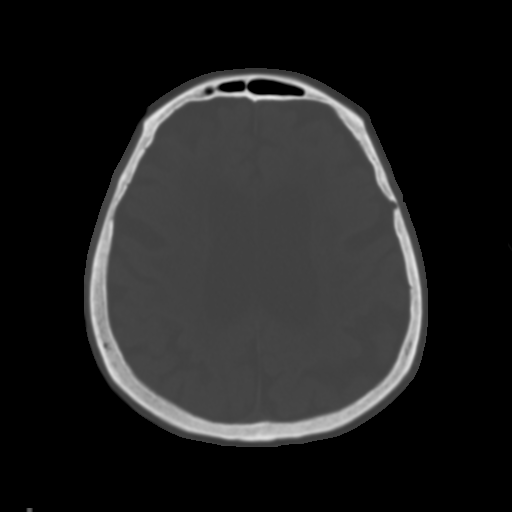
[im 25/34  brain]
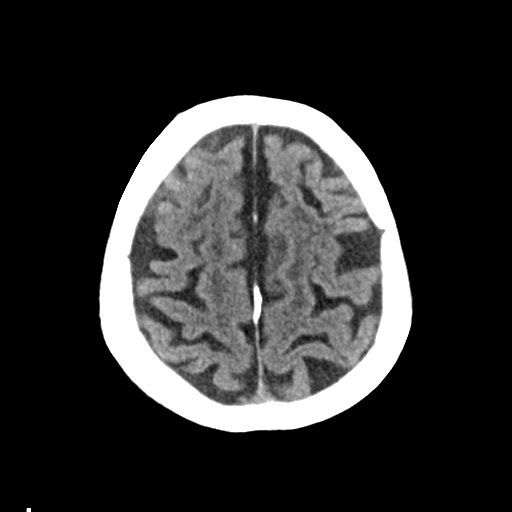
[im 29/34  brain]
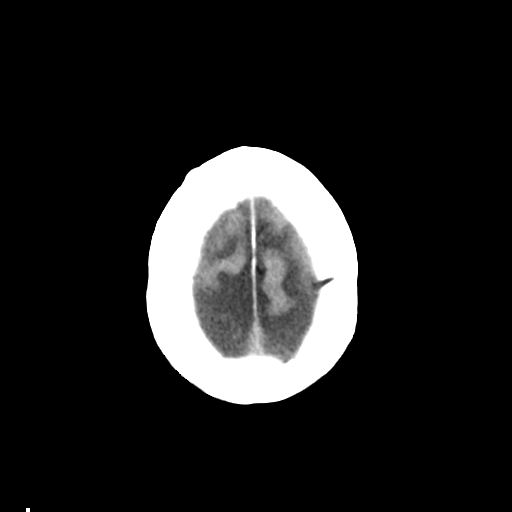

[Series 5: head without cor · coronal · non-contrast · 0.33mm/px · 3 of 67 slices shown]
[im 23/67  brain]
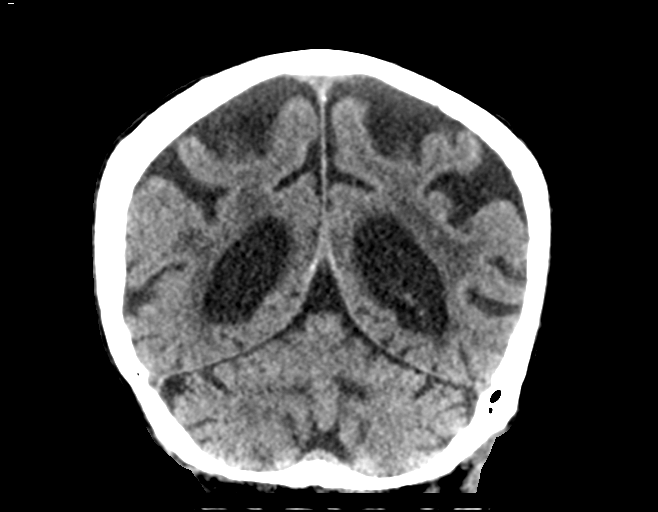
[im 30/67  brain]
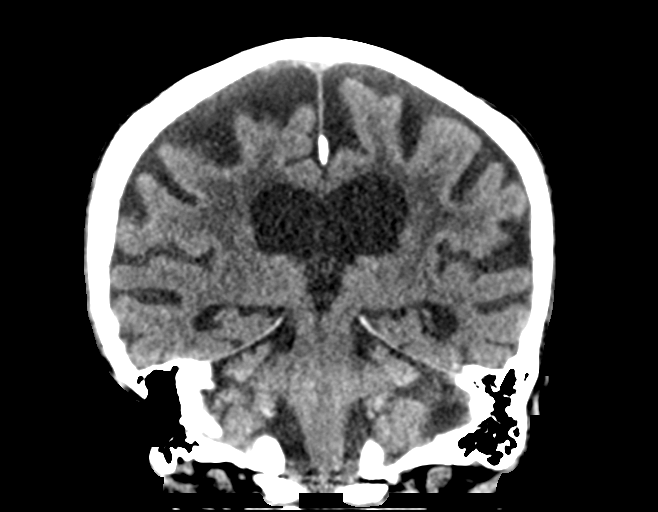
[im 37/67  brain]
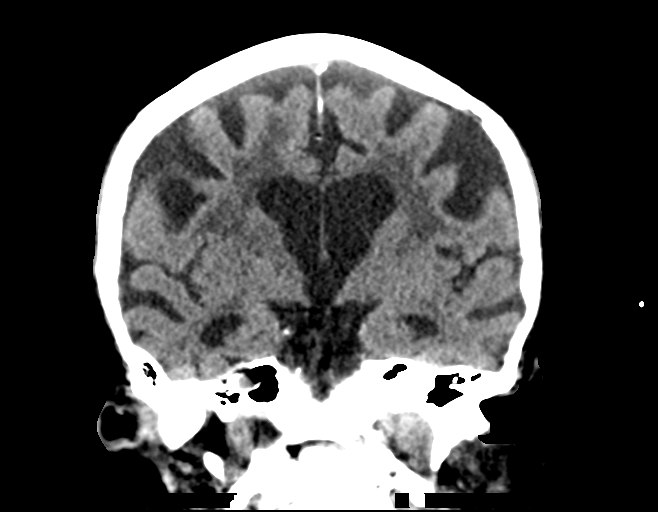

[Series 6: head without sag · sagittal · non-contrast · 0.33mm/px · 3 of 67 slices shown]
[im 23/67  brain]
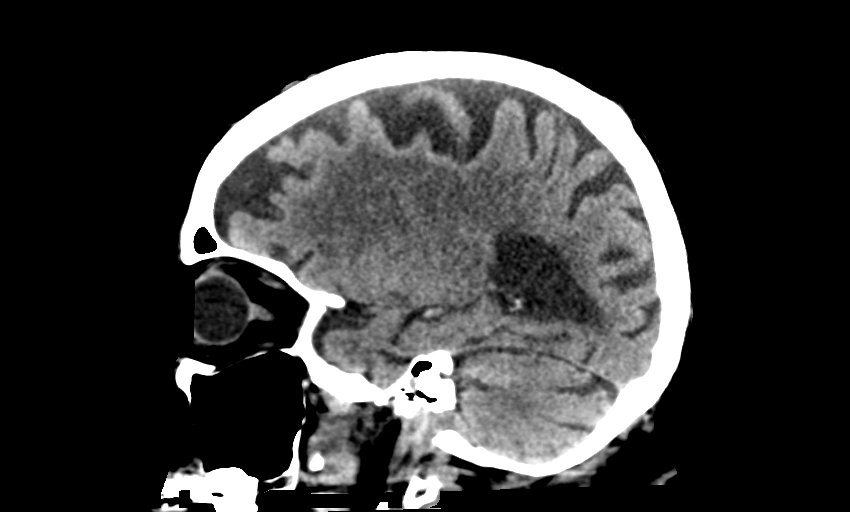
[im 34/67  brain]
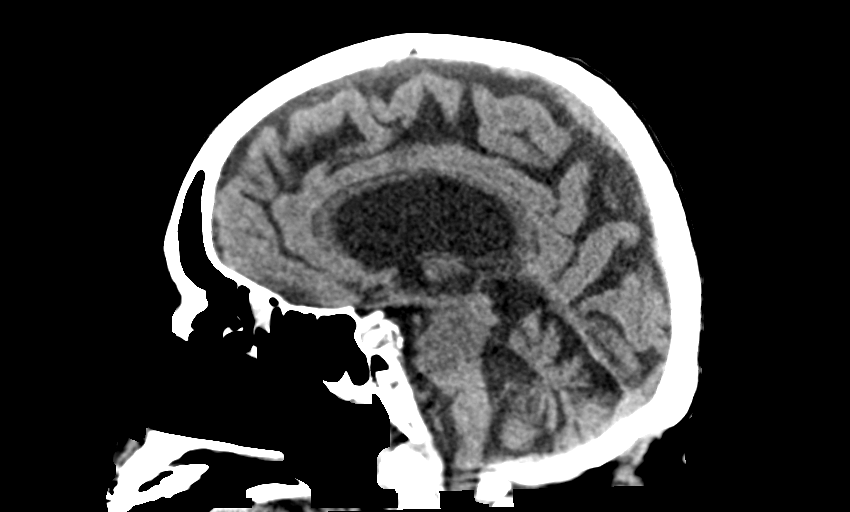
[im 45/67  brain]
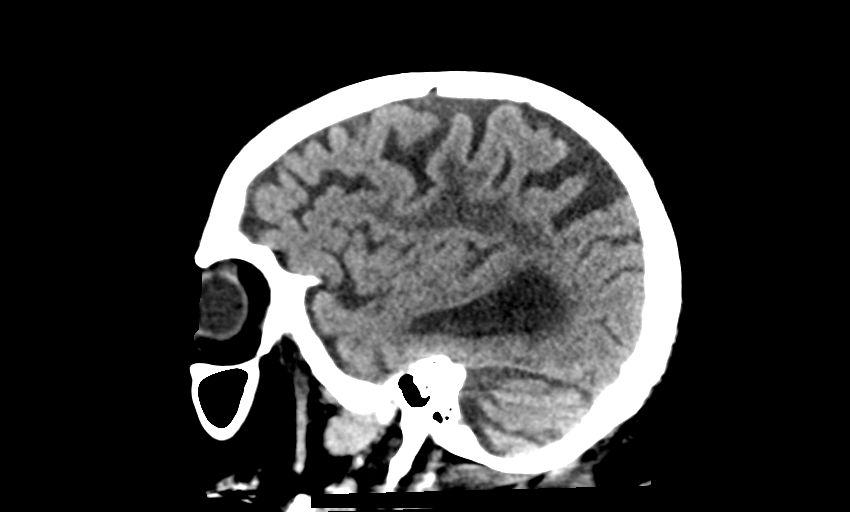

[13 of 47 positions shown; findings below may reference images not displayed]

FINDINGS: Brain: Diffuse atrophy is stable. There is no intracranial mass
hemorrhage, extra-axial fluid collection, or midline shift. There is
decreased attenuation throughout the centra semiovale bilaterally.
Decreased attenuation is also noted in portions of the internal and
external capsules, stable. These changes are consistent with
supratentorial small vessel disease. Small vessel disease also noted
in the thalami. No acute infarct is evident on this study.

Vascular: There is no appreciable hyperdense vessel. There is
calcification in each carotid siphon and distal left vertebral
artery region.

Skull: The bony calvarium appears intact.

Sinuses/Orbits: Patient has undergone previous antrostomy on the
left. There is mucosal thickening in several ethmoid air cells.
Orbits appear symmetric bilaterally.

Other: Mastoid air cells on the left are clear. Postoperative
changes involving the mastoids on the right noted with remaining
mastoids on the right clear.
IMPRESSION: Diffuse atrophy with supratentorial small vessel disease, stable. No
acute infarct evident. No mass or hemorrhage.

Foci of arterial vascular calcification noted.

Previous mastoidectomy on the right. Remaining mastoids are clear.
Mucosal thickening noted in several ethmoid air cells.

## 2022-03-10 IMAGING — CT CT CHEST W/O CM
2 of 4 series · 15 of 36 positions shown, 18 images · non-contrast
Comparison: 01/24/2021

CLINICAL DATA: Chest trauma, large swelling mass of the anterior
right chest

EXAM:
CT CHEST WITHOUT CONTRAST
TECHNIQUE: Multidetector CT imaging of the chest was performed following the
standard protocol without IV contrast.

[Series 4: thorax 2.0 · axial · 0.72mm/px · z∈[+1183,+1407]mm · 12 of 126 slices shown, 15 images]
[im 7/126  mediastinal]
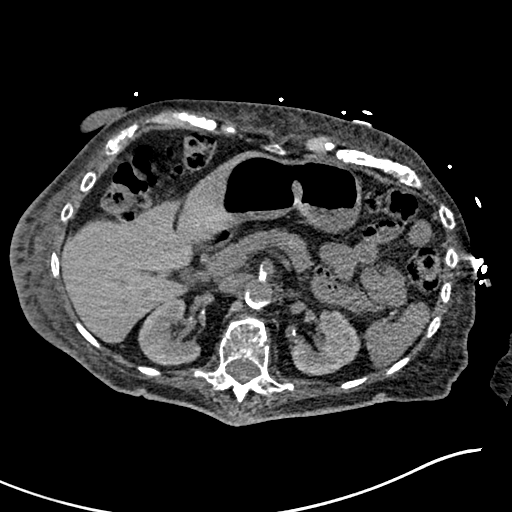
[im 7/126  lung]
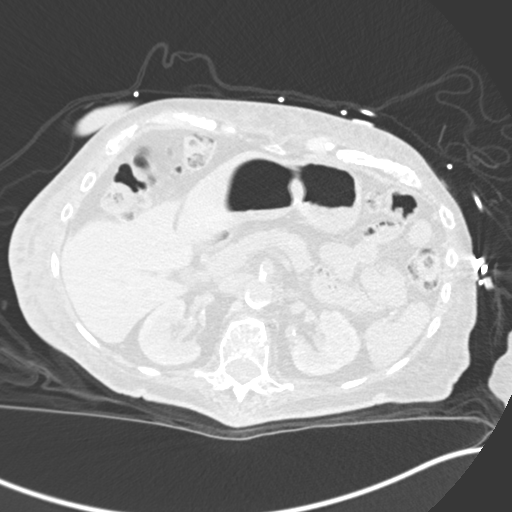
[im 20/126  lung]
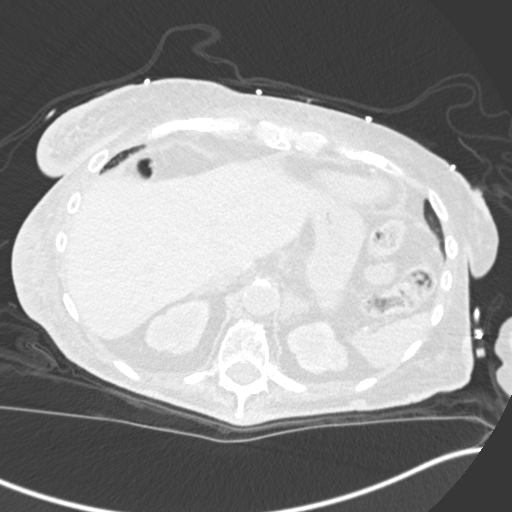
[im 27/126  lung]
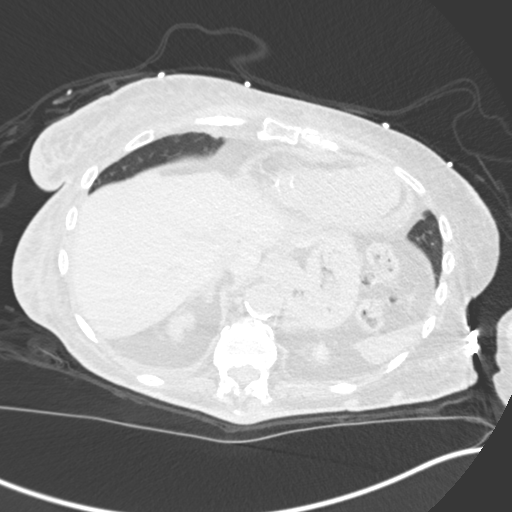
[im 40/126  lung]
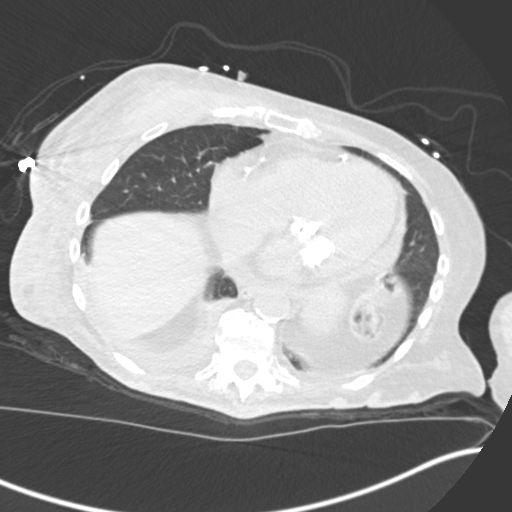
[im 47/126  mediastinal]
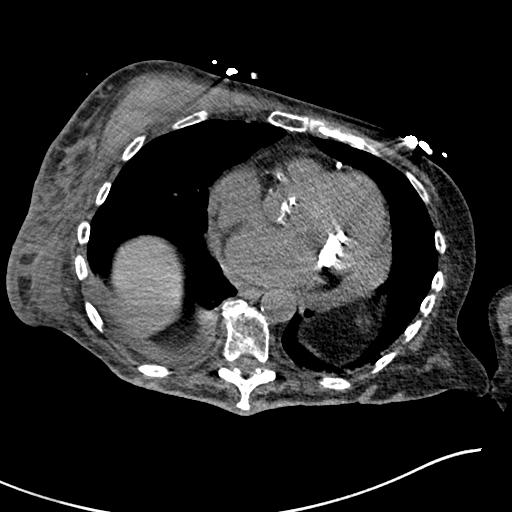
[im 47/126  lung]
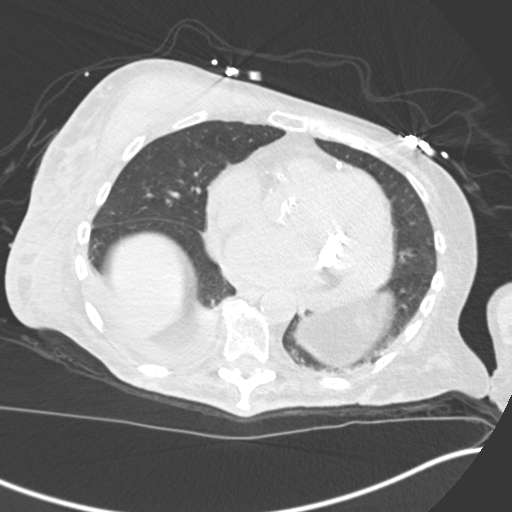
[im 60/126  lung]
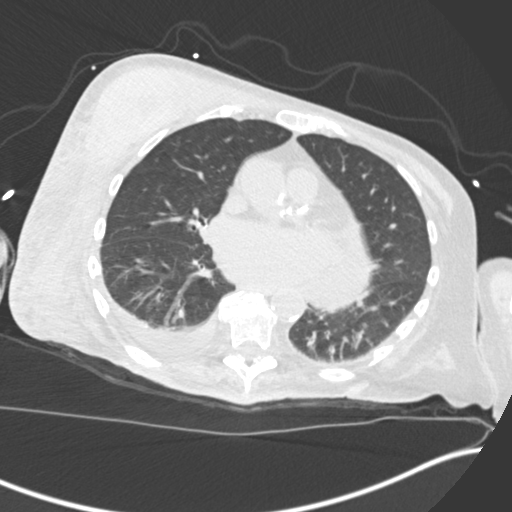
[im 66/126  lung]
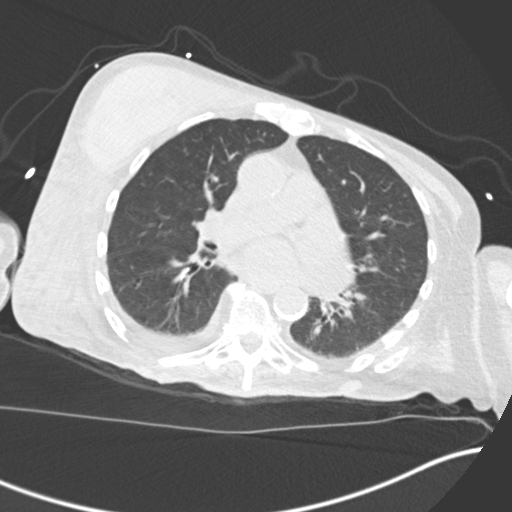
[im 79/126  lung]
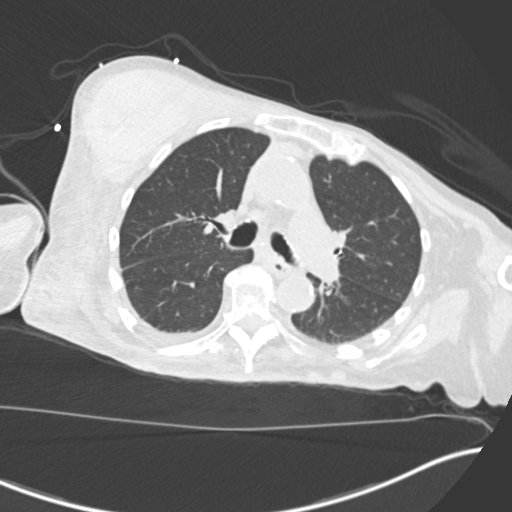
[im 86/126  mediastinal]
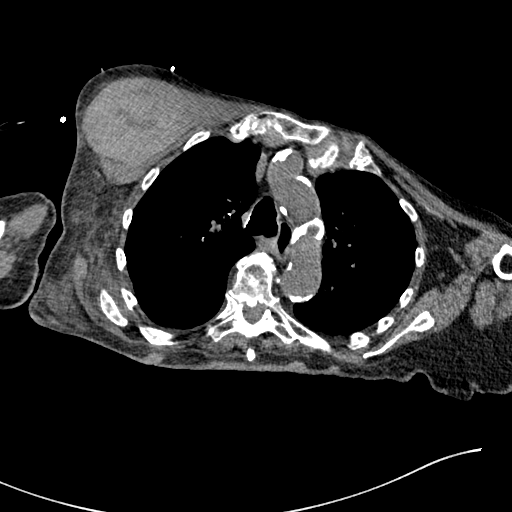
[im 86/126  lung]
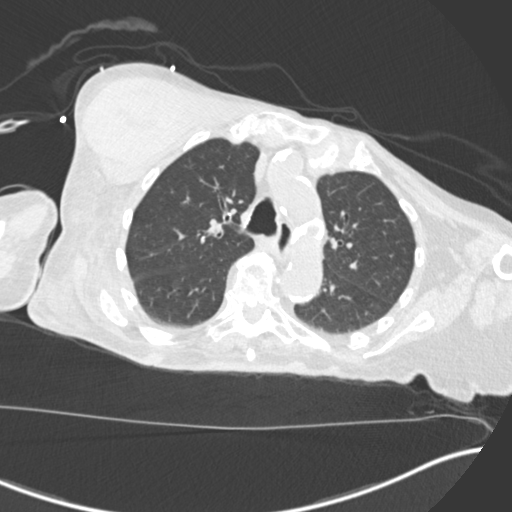
[im 99/126  lung]
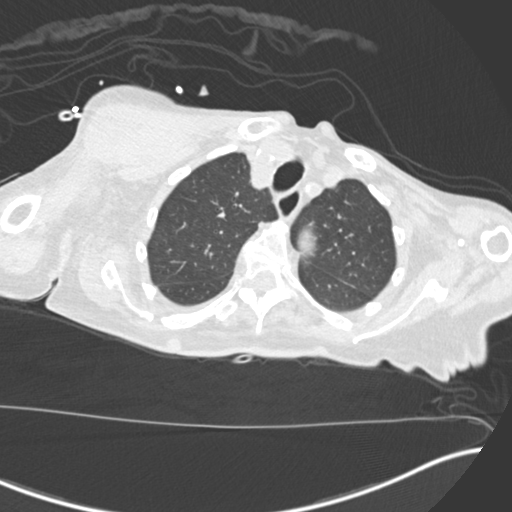
[im 106/126  lung]
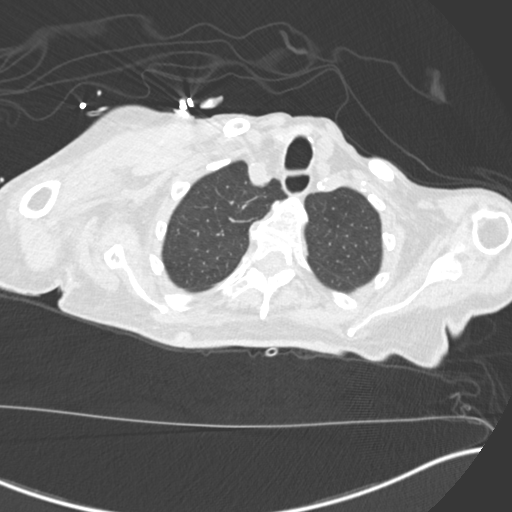
[im 119/126  lung]
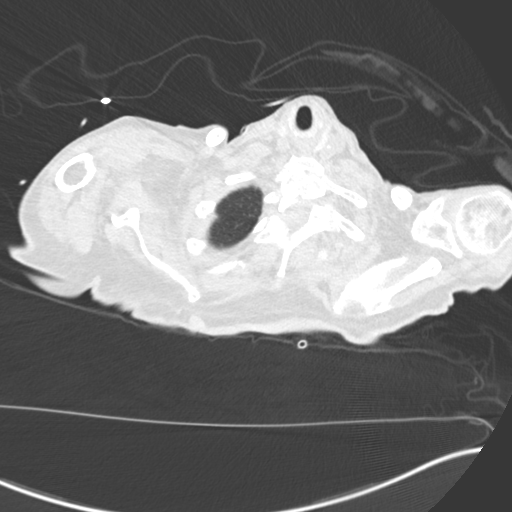

[Series 7: coronal · coronal · 0.59mm/px · 3 of 94 slices shown]
[im 19/94  lung]
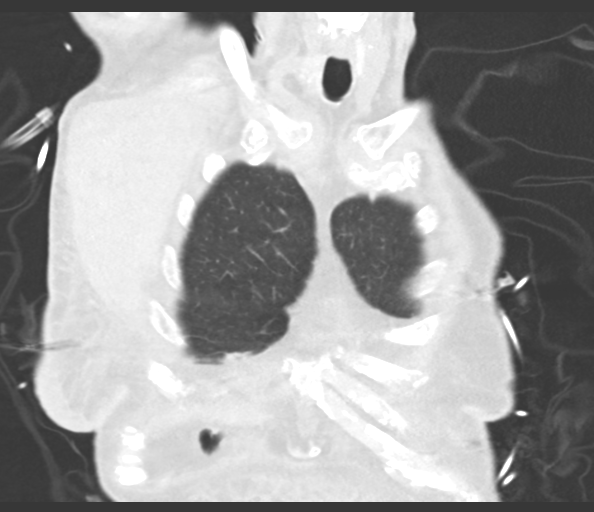
[im 38/94  lung]
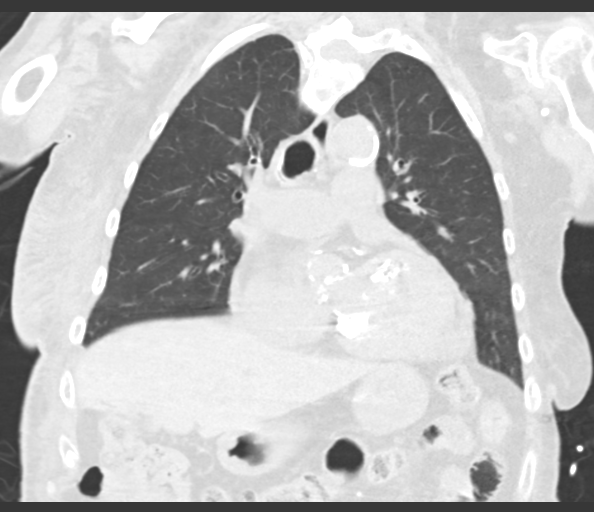
[im 56/94  lung]
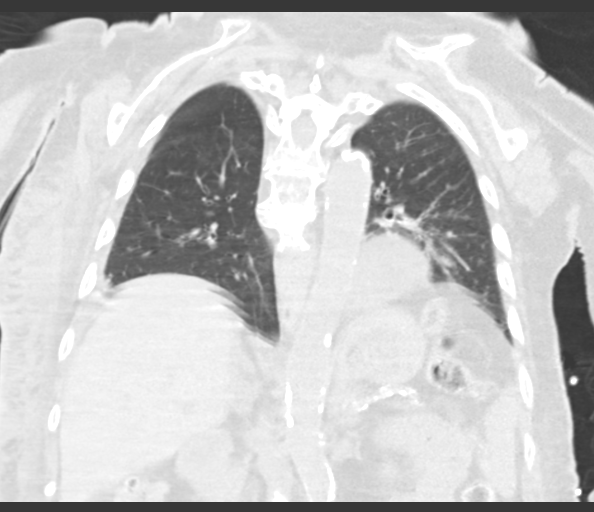

[15 of 36 positions shown; findings below may reference images not displayed]

FINDINGS: Cardiovascular: Aortic atherosclerosis. Dense aortic valve
calcifications. Cardiomegaly. Dense mitral annulus calcifications.
Three-vessel coronary artery calcifications and/or stents. Small
pericardial effusion.

Mediastinum/Nodes: No enlarged mediastinal, hilar, or axillary lymph
nodes. Thyroid gland, trachea, and esophagus demonstrate no
significant findings.

Lungs/Pleura: Lungs are clear. Small right, trace left pleural
effusions, similar to prior examination.

Upper Abdomen: No acute abnormality.

Musculoskeletal: Large hematoma within the right pectoralis major
muscle body measuring 8.3 x 5.4 cm (series 3, image 23). Soft tissue
contusion of the right chest wall. No chest wall mass or suspicious
bone lesions identified.
IMPRESSION: 1. Large hematoma within the right pectoralis major muscle body
measuring 8.3 x 5.4 cm. Soft tissue contusion of the right chest
wall.
2. Small right, trace left pleural effusions, similar to prior
examination.
3. Cardiomegaly and small pericardial effusion, unchanged compared
to prior examination.
4. Coronary artery disease.
5. Dense aortic valve calcifications. Dense mitral annulus
calcifications. For correlate for echocardiographic evidence of
valve dysfunction.

Aortic Atherosclerosis ([id number]).
